# Patient Record
Sex: Male | Born: 1940 | Race: White | Hispanic: No | Marital: Single | Smoking: Never smoker
Health system: Southern US, Community
[De-identification: ages and names within clinical notes are randomized; demographics above are authoritative.]

## PROBLEM LIST (undated history)

## (undated) DIAGNOSIS — M1990 Unspecified osteoarthritis, unspecified site: Secondary | ICD-10-CM

## (undated) DIAGNOSIS — J45909 Unspecified asthma, uncomplicated: Secondary | ICD-10-CM

## (undated) DIAGNOSIS — M5136 Other intervertebral disc degeneration, lumbar region: Secondary | ICD-10-CM

## (undated) DIAGNOSIS — G2581 Restless legs syndrome: Secondary | ICD-10-CM

## (undated) DIAGNOSIS — K439 Ventral hernia without obstruction or gangrene: Secondary | ICD-10-CM

## (undated) HISTORY — PX: SHOULDER SURGERY: SHX246

## (undated) HISTORY — PX: HERNIA REPAIR: SHX51

---

## 1998-10-29 ENCOUNTER — Inpatient Hospital Stay (HOSPITAL_COMMUNITY): Admission: EM | Admit: 1998-10-29 | Discharge: 1998-11-07 | Payer: Self-pay | Admitting: Emergency Medicine

## 1998-10-29 ENCOUNTER — Encounter: Payer: Self-pay | Admitting: Emergency Medicine

## 1998-11-03 ENCOUNTER — Encounter: Payer: Self-pay | Admitting: Pulmonary Disease

## 1998-11-06 ENCOUNTER — Encounter: Payer: Self-pay | Admitting: Pulmonary Disease

## 2005-12-09 ENCOUNTER — Emergency Department (HOSPITAL_COMMUNITY): Admission: EM | Admit: 2005-12-09 | Discharge: 2005-12-09 | Payer: Self-pay | Admitting: Emergency Medicine

## 2005-12-19 ENCOUNTER — Encounter: Admission: RE | Admit: 2005-12-19 | Discharge: 2005-12-19 | Payer: Self-pay | Admitting: Orthopedic Surgery

## 2006-01-10 ENCOUNTER — Inpatient Hospital Stay (HOSPITAL_COMMUNITY): Admission: RE | Admit: 2006-01-10 | Discharge: 2006-01-11 | Payer: Self-pay | Admitting: Orthopedic Surgery

## 2006-02-08 ENCOUNTER — Encounter: Admission: RE | Admit: 2006-02-08 | Discharge: 2006-02-08 | Payer: Self-pay | Admitting: Orthopedic Surgery

## 2006-03-13 ENCOUNTER — Encounter: Admission: RE | Admit: 2006-03-13 | Discharge: 2006-03-21 | Payer: Self-pay | Admitting: Orthopedic Surgery

## 2011-02-01 ENCOUNTER — Emergency Department (HOSPITAL_COMMUNITY)
Admission: EM | Admit: 2011-02-01 | Discharge: 2011-02-01 | Disposition: A | Discharge status: Home / Self Care | Payer: Medicare Other | Attending: Emergency Medicine | Admitting: Emergency Medicine

## 2011-02-01 DIAGNOSIS — K219 Gastro-esophageal reflux disease without esophagitis: Secondary | ICD-10-CM | POA: Insufficient documentation

## 2011-02-01 DIAGNOSIS — J029 Acute pharyngitis, unspecified: Secondary | ICD-10-CM | POA: Insufficient documentation

## 2011-02-01 DIAGNOSIS — J45909 Unspecified asthma, uncomplicated: Secondary | ICD-10-CM | POA: Insufficient documentation

## 2012-01-26 ENCOUNTER — Encounter (HOSPITAL_COMMUNITY): Payer: Self-pay | Admitting: Emergency Medicine

## 2012-01-26 ENCOUNTER — Emergency Department (HOSPITAL_COMMUNITY)
Admission: EM | Admit: 2012-01-26 | Discharge: 2012-01-26 | Disposition: A | Discharge status: Home / Self Care | Payer: Medicare Other | Attending: Emergency Medicine | Admitting: Emergency Medicine

## 2012-01-26 DIAGNOSIS — Z79899 Other long term (current) drug therapy: Secondary | ICD-10-CM | POA: Insufficient documentation

## 2012-01-26 DIAGNOSIS — R339 Retention of urine, unspecified: Secondary | ICD-10-CM | POA: Insufficient documentation

## 2012-01-26 DIAGNOSIS — N289 Disorder of kidney and ureter, unspecified: Secondary | ICD-10-CM | POA: Insufficient documentation

## 2012-01-26 HISTORY — DX: Unspecified asthma, uncomplicated: J45.909

## 2012-01-26 HISTORY — DX: Restless legs syndrome: G25.81

## 2012-01-26 LAB — URINALYSIS, ROUTINE W REFLEX MICROSCOPIC
Bilirubin Urine: NEGATIVE
Ketones, ur: NEGATIVE mg/dL
Leukocytes, UA: NEGATIVE
Protein, ur: NEGATIVE mg/dL
Specific Gravity, Urine: 1.012 (ref 1.005–1.030)
Urobilinogen, UA: 0.2 mg/dL (ref 0.0–1.0)
pH: 5.5 (ref 5.0–8.0)

## 2012-01-26 LAB — BASIC METABOLIC PANEL
BUN: 14 mg/dL (ref 6–23)
CO2: 26 mEq/L (ref 19–32)
Calcium: 9.5 mg/dL (ref 8.4–10.5)
GFR calc non Af Amer: 52 mL/min — ABNORMAL LOW (ref >90)
Glucose, Bld: 122 mg/dL — ABNORMAL HIGH (ref 70–99)
Potassium: 4.1 mEq/L (ref 3.5–5.1)

## 2012-01-26 LAB — CBC WITH DIFFERENTIAL/PLATELET
Eosinophils Relative: 5 % (ref 0–5)
Hemoglobin: 13.5 g/dL (ref 13.0–17.0)
MCV: 93 fL (ref 78.0–100.0)
Neutro Abs: 7.4 10*3/uL (ref 1.7–7.7)
Platelets: 282 10*3/uL (ref 150–400)
RBC: 4.15 MIL/uL — ABNORMAL LOW (ref 4.22–5.81)
WBC: 11.7 10*3/uL — ABNORMAL HIGH (ref 4.0–10.5)

## 2012-01-26 MED ORDER — LIDOCAINE HCL 2 % EX GEL
Freq: Once | CUTANEOUS | Status: AC
  Administered 2012-01-26: 10 via URETHRAL

## 2012-01-26 MED ORDER — LIDOCAINE HCL 2 % EX GEL
CUTANEOUS | Status: AC
  Administered 2012-01-26: 10 via URETHRAL
  Filled 2012-01-26: qty 10

## 2012-01-26 NOTE — ED Notes (Signed)
Bladder scan performed. Bladder scan reveals volume > 999 ml.

## 2012-01-26 NOTE — ED Notes (Signed)
Foley drainage bag converted to leg bag. Patient and significant other instructed on use; both verbalized understanding.

## 2012-01-26 NOTE — ED Notes (Signed)
Foley catheter inserted without difficulty. Immediate return of 250 ml amber urine. 30 ml urine collected and sent to lab for urinalysis.Patient tolerated procedure well.

## 2012-01-26 NOTE — ED Provider Notes (Signed)
History     CSN: [id number]  Arrival date & time 01/26/12  2000   First MD Initiated Contact with Patient 01/26/12 2032      Chief Complaint  Patient presents with  . Dysuria     HPI Patient presents to the emergency room with complaints of decreased urination.  For the last few days he did notice some burning with urination. Today however he has not been able to urinate properly. The last time he was able to urinate normally with him in the morning. He has not had any fevers or vomiting. His history of having urinary retention in the past. Patient denies any chest pain or shortness of breath. The symptoms are moderate to severe and nothing is making it better Past Medical History  Diagnosis Date  . Asthma   . RLS (restless legs syndrome)     Past Surgical History  Procedure Date  . Hernia repair     No family history on file.  History  Substance Use Topics  . Smoking status: Never Smoker   . Smokeless tobacco: Not on file  . Alcohol Use: Yes     4x week      Review of Systems  All other systems reviewed and are negative.    Allergies  Morphine and related  Home Medications   Current Outpatient Rx  Name Route Sig Dispense Refill  . CLONAZEPAM 0.5 MG PO TABS Oral Take 0.5 mg by mouth 2 (two) times daily as needed.    Marland Kitchen NAPROXEN 500 MG PO TABS Oral Take 500 mg by mouth 2 (two) times daily with a meal.    . OMEPRAZOLE 20 MG PO CPDR Oral Take 20 mg by mouth daily.      BP 137/96  Pulse 102  Temp 99 F (37.2 C) (Oral)  Resp 18  Ht 5\' 6"  (1.676 m)  Wt 182 lb (82.555 kg)  BMI 29.38 kg/m2  SpO2 98%  Physical Exam  Nursing note and vitals reviewed. Constitutional: He appears well-developed and well-nourished. No distress.  HENT:  Head: Normocephalic and atraumatic.  Right Ear: External ear normal.  Left Ear: External ear normal.  Eyes: Conjunctivae are normal. Right eye exhibits no discharge. Left eye exhibits no discharge. No scleral icterus.  Neck:  Neck supple. No tracheal deviation present.  Cardiovascular: Normal rate, regular rhythm and intact distal pulses.   Pulmonary/Chest: Effort normal and breath sounds normal. No stridor. No respiratory distress. He has no wheezes. He has no rales.  Abdominal: Soft. Bowel sounds are normal. He exhibits no distension. There is tenderness (suprapubic region). There is no rebound and no guarding.       Surgical scars on abdomen, ventral hernia   Genitourinary: Penis normal.  Musculoskeletal: He exhibits no edema and no tenderness.  Neurological: He is alert. He has normal strength. No sensory deficit. Cranial nerve deficit:  no gross defecits noted. He exhibits normal muscle tone. He displays no seizure activity. Coordination normal.       Decreased hearing  Skin: Skin is warm and dry. No rash noted.  Psychiatric: He has a normal mood and affect.    ED Course  Procedures (including critical care time)  Labs Reviewed  URINALYSIS, ROUTINE W REFLEX MICROSCOPIC - Abnormal; Notable for the following:    Hgb urine dipstick MODERATE (*)     All other components within normal limits  CBC WITH DIFFERENTIAL - Abnormal; Notable for the following:    WBC 11.7 (*)  RBC 4.15 (*)     HCT 38.6 (*)     Monocytes Relative 14 (*)     Monocytes Absolute 1.7 (*)     All other components within normal limits  BASIC METABOLIC PANEL - Abnormal; Notable for the following:    Glucose, Bld 122 (*)     Creatinine, Ser 1.36 (*)     GFR calc non Af Amer 52 (*)     GFR calc Af Amer 60 (*)     All other components within normal limits  URINE MICROSCOPIC-ADD ON   No results found.   1. Urinary retention       MDM  Bladder scan revealed greater than 1000 cc of urine. A Foley catheter was inserted by nursing staff the patient is feeling much better at this time.  Urinalysis does not suggest a urinary infection.  He does have some mild renal insufficiency but this can be followed up as an outpatient. Patient  discharged home with a Foley catheter in I will refer him to urology.  At this time there does not appear to be any evidence of an acute emergency medical condition and the patient appears stable for discharge with appropriate outpatient follow up.         Celene Kras, MD 01/26/12 2212

## 2012-01-26 NOTE — ED Notes (Addendum)
Pt c/o painful, increased urination x 3 days. Also c/o severe intermittent lower abd pain, pain exacerbated with standing and coughing. No distress noted, PWD

## 2013-05-19 ENCOUNTER — Encounter (HOSPITAL_COMMUNITY): Payer: Self-pay | Admitting: Emergency Medicine

## 2013-05-19 ENCOUNTER — Emergency Department (HOSPITAL_COMMUNITY)
Admission: EM | Admit: 2013-05-19 | Discharge: 2013-05-19 | Disposition: A | Discharge status: Home / Self Care | Payer: Medicare Other | Attending: Emergency Medicine | Admitting: Emergency Medicine

## 2013-05-19 DIAGNOSIS — Z79899 Other long term (current) drug therapy: Secondary | ICD-10-CM | POA: Insufficient documentation

## 2013-05-19 DIAGNOSIS — R109 Unspecified abdominal pain: Secondary | ICD-10-CM | POA: Insufficient documentation

## 2013-05-19 DIAGNOSIS — J45909 Unspecified asthma, uncomplicated: Secondary | ICD-10-CM | POA: Insufficient documentation

## 2013-05-19 DIAGNOSIS — IMO0002 Reserved for concepts with insufficient information to code with codable children: Secondary | ICD-10-CM | POA: Insufficient documentation

## 2013-05-19 DIAGNOSIS — R339 Retention of urine, unspecified: Secondary | ICD-10-CM | POA: Insufficient documentation

## 2013-05-19 DIAGNOSIS — G2581 Restless legs syndrome: Secondary | ICD-10-CM | POA: Insufficient documentation

## 2013-05-19 LAB — URINALYSIS, ROUTINE W REFLEX MICROSCOPIC
Glucose, UA: NEGATIVE mg/dL
Ketones, ur: 15 mg/dL — AB
Leukocytes, UA: NEGATIVE
Protein, ur: NEGATIVE mg/dL
Urobilinogen, UA: 0.2 mg/dL (ref 0.0–1.0)

## 2013-05-19 NOTE — ED Notes (Addendum)
Pt states that he has not been able to urinate for 2.5 days. Pt states when he goes he "squirks a little" but does not empty his bladder. 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 Family reports that he states that he does not feel good and in pain. Pt states pain is located in his bladder, back side and head. Pt report last bm Friday. Pt has ab hernia. Pt goes to [place].

## 2013-05-19 NOTE — ED Provider Notes (Signed)
CSN: [id number]     Arrival date & time 05/19/13  0149 History   First MD Initiated Contact with Patient 05/19/13 [phone]     Chief Complaint  Patient presents with  . Urinary Retention  . Fatigue   (Consider location/radiation/quality/duration/timing/severity/associated sxs/prior Treatment) HPI History provided by patient. Difficulty urinating for the last 2 days. Able to go very small amount but feels like he is not emptying his bladder. He has increasing lower ABD discomfort, moderate to severe. He denies any back pain, fevers, nausea or vomiting. Past Medical History  Diagnosis Date  . Asthma   . RLS (restless legs syndrome)    Past Surgical History  Procedure Laterality Date  . Hernia repair     History reviewed. No pertinent family history. History  Substance Use Topics  . Smoking status: Never Smoker   . Smokeless tobacco: Not on file  . Alcohol Use: Yes     Comment: 4x week    Review of Systems  Constitutional: Negative for fever and chills.  Respiratory: Negative for shortness of breath.   Cardiovascular: Negative for chest pain.  Gastrointestinal: Positive for abdominal pain. Negative for vomiting.  Genitourinary: Negative for dysuria.  Musculoskeletal: Negative for back pain.  Skin: Negative for rash.  Neurological: Negative for headaches.  All other systems reviewed and are negative.    Allergies  Cheese and Morphine and related  Home Medications   Current Outpatient Rx  Name  Route  Sig  Dispense  Refill  . atorvastatin (LIPITOR) 40 MG tablet   Oral   Take 40 mg by mouth daily.         . benzonatate (TESSALON) 100 MG capsule   Oral   Take 100 mg by mouth 3 (three) times daily as needed for cough.         . finasteride (PROSCAR) 5 MG tablet   Oral   Take 5 mg by mouth daily.         Marland Kitchen FLUoxetine (PROZAC) 20 MG capsule   Oral   Take 20 mg by mouth daily.         . fluticasone (FLONASE) 50 MCG/ACT nasal spray   Each Nare   Place 1  spray into both nostrils daily.         Marland Kitchen gabapentin (NEURONTIN) 100 MG capsule   Oral   Take 100 mg by mouth 3 (three) times daily.         . naproxen (NAPROSYN) 500 MG tablet   Oral   Take 500 mg by mouth 2 (two) times daily with a meal.         . omeprazole (PRILOSEC) 20 MG capsule   Oral   Take 20 mg by mouth daily.         Marland Kitchen terazosin (HYTRIN) 5 MG capsule   Oral   Take 5 mg by mouth at bedtime.         . Vitamin D, Ergocalciferol, (DRISDOL) 50000 UNITS CAPS capsule   Oral   Take 50,000 Units by mouth every 7 (seven) days.          BP 167/83  Pulse 95  Temp(Src) 98 F (36.7 C) (Oral)  Resp 18  SpO2 97% Physical Exam  Constitutional: He is oriented to person, place, and time. He appears well-developed and well-nourished.  HENT:  Head: Normocephalic and atraumatic.  Eyes: EOM are normal. Pupils are equal, round, and reactive to light.  Neck: Neck supple.  Cardiovascular: Normal rate, regular rhythm  and intact distal pulses.   Pulmonary/Chest: Effort normal. No respiratory distress.  Abdominal: Soft. Bowel sounds are normal.  Suprapubic fullness and tenderness without abdominal tenderness otherwise  Genitourinary:  Normal external GU  Musculoskeletal: Normal range of motion. He exhibits no edema.  Neurological: He is alert and oriented to person, place, and time.  Skin: Skin is warm and dry.    ED Course  Procedures (including critical care time) Labs Review Labs Reviewed  URINALYSIS, ROUTINE W REFLEX MICROSCOPIC - Abnormal; Notable for the following:    APPearance HAZY (*)    Ketones, ur 15 (*)    All other components within normal limits   Foley catheter placed and 800 cc clear urine output  Patient given referral to urology with leg bag. He states understanding all discharge and followup instructions. Return precautions provided. Stable for discharge home at this time.  MDM  Diagnosis: Urinary retention  UA reviewed as above no  UTI. Repeat abdominal exam benign. Soft nontender nondistended. Condition improved after Foley catheter placed. VS WNL    Sunnie Nielsen, MD 05/21/13 [phone]

## 2013-05-19 NOTE — ED Notes (Signed)
Pt c/o urinary retention x 3 days. Last bowel movement was the 28th. A&Ox4. NAd noted. Pt. Very hard of hearing.

## 2016-01-13 ENCOUNTER — Other Ambulatory Visit: Payer: Self-pay

## 2016-01-13 DIAGNOSIS — K625 Hemorrhage of anus and rectum: Secondary | ICD-10-CM

## 2016-02-24 ENCOUNTER — Ambulatory Visit
Admission: RE | Admit: 2016-02-24 | Discharge: 2016-02-24 | Disposition: A | Discharge status: Home / Self Care | Payer: Medicare Other | Source: Ambulatory Visit

## 2016-02-24 DIAGNOSIS — K625 Hemorrhage of anus and rectum: Secondary | ICD-10-CM

## 2017-12-03 ENCOUNTER — Other Ambulatory Visit: Payer: Self-pay

## 2017-12-03 ENCOUNTER — Encounter (HOSPITAL_COMMUNITY): Payer: Self-pay

## 2017-12-03 ENCOUNTER — Inpatient Hospital Stay (HOSPITAL_COMMUNITY)
Admission: EM | Admit: 2017-12-03 | Discharge: 2017-12-09 | DRG: 853 | Disposition: A | Discharge status: Home Health | Payer: Medicare Other | Attending: Internal Medicine | Admitting: Internal Medicine

## 2017-12-03 ENCOUNTER — Emergency Department (HOSPITAL_COMMUNITY): Payer: Medicare Other

## 2017-12-03 DIAGNOSIS — F329 Major depressive disorder, single episode, unspecified: Secondary | ICD-10-CM | POA: Diagnosis present

## 2017-12-03 DIAGNOSIS — B9620 Unspecified Escherichia coli [E. coli] as the cause of diseases classified elsewhere: Secondary | ICD-10-CM | POA: Diagnosis present

## 2017-12-03 DIAGNOSIS — I96 Gangrene, not elsewhere classified: Secondary | ICD-10-CM | POA: Diagnosis present

## 2017-12-03 DIAGNOSIS — K632 Fistula of intestine: Secondary | ICD-10-CM | POA: Diagnosis present

## 2017-12-03 DIAGNOSIS — A419 Sepsis, unspecified organism: Principal | ICD-10-CM | POA: Diagnosis present

## 2017-12-03 DIAGNOSIS — G2581 Restless legs syndrome: Secondary | ICD-10-CM | POA: Diagnosis present

## 2017-12-03 DIAGNOSIS — K449 Diaphragmatic hernia without obstruction or gangrene: Secondary | ICD-10-CM | POA: Diagnosis present

## 2017-12-03 DIAGNOSIS — D649 Anemia, unspecified: Secondary | ICD-10-CM | POA: Diagnosis present

## 2017-12-03 DIAGNOSIS — H9190 Unspecified hearing loss, unspecified ear: Secondary | ICD-10-CM | POA: Diagnosis present

## 2017-12-03 DIAGNOSIS — K3532 Acute appendicitis with perforation and localized peritonitis, without abscess: Secondary | ICD-10-CM | POA: Diagnosis present

## 2017-12-03 DIAGNOSIS — E669 Obesity, unspecified: Secondary | ICD-10-CM | POA: Diagnosis present

## 2017-12-03 DIAGNOSIS — F419 Anxiety disorder, unspecified: Secondary | ICD-10-CM | POA: Diagnosis present

## 2017-12-03 DIAGNOSIS — N400 Enlarged prostate without lower urinary tract symptoms: Secondary | ICD-10-CM | POA: Diagnosis present

## 2017-12-03 DIAGNOSIS — N183 Chronic kidney disease, stage 3 (moderate): Secondary | ICD-10-CM | POA: Diagnosis present

## 2017-12-03 DIAGNOSIS — E785 Hyperlipidemia, unspecified: Secondary | ICD-10-CM | POA: Diagnosis present

## 2017-12-03 DIAGNOSIS — J45909 Unspecified asthma, uncomplicated: Secondary | ICD-10-CM | POA: Diagnosis present

## 2017-12-03 DIAGNOSIS — K3533 Acute appendicitis with perforation and localized peritonitis, with abscess: Secondary | ICD-10-CM | POA: Diagnosis present

## 2017-12-03 DIAGNOSIS — L02211 Cutaneous abscess of abdominal wall: Secondary | ICD-10-CM | POA: Diagnosis present

## 2017-12-03 DIAGNOSIS — K651 Peritoneal abscess: Secondary | ICD-10-CM | POA: Diagnosis not present

## 2017-12-03 DIAGNOSIS — K439 Ventral hernia without obstruction or gangrene: Secondary | ICD-10-CM | POA: Diagnosis present

## 2017-12-03 DIAGNOSIS — K4020 Bilateral inguinal hernia, without obstruction or gangrene, not specified as recurrent: Secondary | ICD-10-CM | POA: Diagnosis present

## 2017-12-03 DIAGNOSIS — K5900 Constipation, unspecified: Secondary | ICD-10-CM | POA: Diagnosis present

## 2017-12-03 HISTORY — DX: Unspecified osteoarthritis, unspecified site: M19.90

## 2017-12-03 HISTORY — DX: Other intervertebral disc degeneration, lumbar region: M51.36

## 2017-12-03 HISTORY — DX: Ventral hernia without obstruction or gangrene: K43.9

## 2017-12-03 LAB — CBC WITH DIFFERENTIAL/PLATELET
BASOS ABS: 0 10*3/uL (ref 0.0–0.1)
BASOS PCT: 0 %
Eosinophils Absolute: 0.4 10*3/uL (ref 0.0–0.7)
Eosinophils Relative: 3 %
HEMATOCRIT: 31.2 % — AB (ref 39.0–52.0)
Hemoglobin: 10.1 g/dL — ABNORMAL LOW (ref 13.0–17.0)
LYMPHS PCT: 11 %
Lymphs Abs: 1.6 10*3/uL (ref 0.7–4.0)
MCH: 31.1 pg (ref 26.0–34.0)
MCHC: 32.4 g/dL (ref 30.0–36.0)
MCV: 96 fL (ref 78.0–100.0)
MONOS PCT: 9 %
Monocytes Absolute: 1.3 10*3/uL — ABNORMAL HIGH (ref 0.1–1.0)
NEUTROS ABS: 10.8 10*3/uL — AB (ref 1.7–7.7)
NEUTROS PCT: 77 %
Platelets: 316 10*3/uL (ref 150–400)
RBC: 3.25 MIL/uL — ABNORMAL LOW (ref 4.22–5.81)
RDW: 13.2 % (ref 11.5–15.5)
WBC: 14.1 10*3/uL — ABNORMAL HIGH (ref 4.0–10.5)

## 2017-12-03 LAB — COMPREHENSIVE METABOLIC PANEL
ALBUMIN: 3 g/dL — AB (ref 3.5–5.0)
ALK PHOS: 71 U/L (ref 38–126)
ALT: 34 U/L (ref 0–44)
AST: 21 U/L (ref 15–41)
Anion gap: 10 (ref 5–15)
BILIRUBIN TOTAL: 0.1 mg/dL — AB (ref 0.3–1.2)
BUN: 27 mg/dL — AB (ref 8–23)
CO2: 26 mmol/L (ref 22–32)
Calcium: 9.1 mg/dL (ref 8.9–10.3)
Chloride: 102 mmol/L (ref 98–111)
Creatinine, Ser: 1.56 mg/dL — ABNORMAL HIGH (ref 0.61–1.24)
GFR calc Af Amer: 48 mL/min — ABNORMAL LOW (ref >60)
GFR, EST NON AFRICAN AMERICAN: 42 mL/min — AB (ref >60)
GLUCOSE: 144 mg/dL — AB (ref 70–99)
Potassium: 4 mmol/L (ref 3.5–5.1)
Sodium: 138 mmol/L (ref 135–145)
TOTAL PROTEIN: 7 g/dL (ref 6.5–8.1)

## 2017-12-03 LAB — C-REACTIVE PROTEIN: CRP: 13.4 mg/dL — ABNORMAL HIGH (ref <1.0)

## 2017-12-03 LAB — SEDIMENTATION RATE: Sed Rate: 126 mm/hr — ABNORMAL HIGH (ref 0–16)

## 2017-12-03 LAB — I-STAT CG4 LACTIC ACID, ED: Lactic Acid, Venous: 1.42 mmol/L (ref 0.5–1.9)

## 2017-12-03 MED ORDER — PIPERACILLIN-TAZOBACTAM 3.375 G IVPB
3.3750 g | Freq: Three times a day (TID) | INTRAVENOUS | Status: DC
  Administered 2017-12-04 – 2017-12-07 (×10): 3.375 g via INTRAVENOUS
  Filled 2017-12-03 (×12): qty 50

## 2017-12-03 MED ORDER — PIPERACILLIN-TAZOBACTAM 3.375 G IVPB 30 MIN
3.3750 g | Freq: Once | INTRAVENOUS | Status: AC
  Administered 2017-12-03: 3.375 g via INTRAVENOUS
  Filled 2017-12-03: qty 50

## 2017-12-03 MED ORDER — SODIUM CHLORIDE 0.9 % IV BOLUS
1000.0000 mL | Freq: Once | INTRAVENOUS | Status: AC
  Administered 2017-12-03: 1000 mL via INTRAVENOUS

## 2017-12-03 MED ORDER — IOHEXOL 300 MG/ML  SOLN
75.0000 mL | Freq: Once | INTRAMUSCULAR | Status: AC | PRN
  Administered 2017-12-03: 75 mL via INTRAVENOUS

## 2017-12-03 MED ORDER — VANCOMYCIN HCL 10 G IV SOLR
2000.0000 mg | Freq: Once | INTRAVENOUS | Status: AC
  Administered 2017-12-03: 2000 mg via INTRAVENOUS
  Filled 2017-12-03: qty 2000

## 2017-12-03 MED ORDER — ACETAMINOPHEN 325 MG PO TABS
650.0000 mg | ORAL_TABLET | Freq: Once | ORAL | Status: AC
  Administered 2017-12-03: 650 mg via ORAL
  Filled 2017-12-03: qty 2

## 2017-12-03 NOTE — Progress Notes (Signed)
Pharmacy Antibiotic Note  Jack Vance is a 77 y.o. 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  Plan: Zosyn 3.375 gm IV q8h extended interval Pharmacy will sign off and follow peripherally for renal adjustments  Height: 5\' 6"  (167.6 cm) Weight: 195 lb (88.5 kg) IBW/kg (Calculated) : 63.8  Temp (24hrs), Avg:97.6 F (36.4 C), Min:97.3 F (36.3 C), Max:97.8 F (36.6 C)  Recent Labs  Lab 12/03/17 1914 12/03/17 1920  WBC 14.1*  --   CREATININE 1.56*  --   LATICACIDVEN  --  1.42    Estimated Creatinine Clearance: 42.7 mL/min (A) (by C-G formula based on SCr of 1.56 mg/dL (H)).    Allergies  Allergen Reactions  . Cheese     Causes asthma  . Morphine And Related Rash    Antimicrobials this admission: 7/16 vanc  X 1 7/ 16 zosyn >>  Thank you for allowing pharmacy to be a part of this patient's care.  Arley Phenix RPh 12/03/2017, 10:50 PM Pager [phone]

## 2017-12-03 NOTE — ED Provider Notes (Signed)
[facility]-EMERGENCY DEPT Provider Note   CSN: [id number] Arrival date & time: 12/03/17  1044     History   Chief Complaint Chief Complaint  Patient presents with  . Abdominal Pain  . redness    HPI Jack Vance is a 77 y.o. male.  HPI 77 year old male with past medical history of extremely large hernia here with drainage and redness to the right abdomen.  Patient states that over the last several days, he noticed a small area of swelling in his right lower abdomen.  It then became red and began draining foul-smelling fluid.  He had associated nausea but no fever.  Is been moving his bowels.  He states that the redness has become increasingly painful and is aching, throbbing, worse with palpation.  Spreading throughout his abdomen so he presents for evaluation.  He does not know or think he has had any fevers.  Denies any diarrhea.  No other complaints.  Past Medical History:  Diagnosis Date  . Arthritis   . Asthma   . DDD (degenerative disc disease), lumbar   . Hernia of abdominal wall   . RLS (restless legs syndrome)     Patient Active Problem List   Diagnosis Date Noted  . 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 HERNIA REPAIR    . SHOULDER SURGERY          Home Medications    Prior to Admission medications   Medication Sig Start Date End Date Taking? Authorizing Provider  atorvastatin (LIPITOR) 80 MG tablet Take 80 mg by mouth daily.   Yes [provider]  budesonide-formoterol (SYMBICORT) 160-4.5 MCG/ACT inhaler Inhale 2 puffs into the lungs 2 (two) times daily.   Yes [provider]  clonazePAM (KLONOPIN) 1 MG tablet Take 1 mg by mouth 2 (two) times daily.   Yes [provider]  finasteride (PROSCAR) 5 MG tablet Take 5 mg by mouth daily.   Yes [provider]  FLUoxetine (PROZAC) 20 MG capsule Take 20 mg by mouth daily.   Yes [provider]  fluticasone (FLONASE) 50 MCG/ACT nasal spray Place 1 spray into both nostrils daily.   Yes [provider]  gabapentin (NEURONTIN) 400 MG capsule Take 400-800 mg by mouth 3 (three) times daily. Take 1 capsule BID and Take 2 capsules QHS   Yes [provider]  Ipratropium-Albuterol (COMBIVENT RESPIMAT) 20-100 MCG/ACT AERS respimat Inhale 1 puff into the lungs every 6 (six) hours.   Yes [provider]  lidocaine (LIDODERM) 5 % Place 1 patch onto the skin daily. Remove & Discard patch within 12 hours or as directed by MD   Yes [provider]  rOPINIRole (REQUIP) 2 MG tablet Take 4 mg by mouth 3 (three) times daily.   Yes [provider]  tamsulosin (FLOMAX) 0.4 MG CAPS capsule Take 0.4 mg by mouth daily.   Yes [provider]  triamterene-hydrochlorothiazide (MAXZIDE-25) 37.5-25 MG tablet Take 1 tablet by mouth daily.   Yes [provider]  benzonatate (TESSALON) 100 MG capsule Take 100 mg by mouth 3 (three) times daily as needed for cough.    [provider]    Family History History reviewed. No pertinent family history.  Social History Social History   Tobacco Use  . Smoking status: Never Smoker  . Smokeless tobacco: Never Used  Substance Use Topics  . Alcohol use: Yes    Comment:  4x week  . Drug use: No     Allergies   Cheese and Morphine and related   Review of Systems Review of Systems  Constitutional: Positive for fatigue. Negative for chills and fever.  HENT: Negative for congestion and rhinorrhea.   Eyes: Negative for visual disturbance.  Respiratory: Negative for cough, shortness of breath and wheezing.   Cardiovascular: Negative for chest pain and leg swelling.  Gastrointestinal: Positive for abdominal pain and nausea. Negative for diarrhea and vomiting.  Genitourinary: Negative for dysuria and flank pain.  Musculoskeletal: Negative for neck pain and neck stiffness.  Skin:  Positive for rash and wound.  Allergic/Immunologic: Negative for immunocompromised state.  Neurological: Negative for syncope, weakness and headaches.  All other systems reviewed and are negative.    Physical Exam Updated Vital Signs BP 128/66 (BP Location: Left Arm)   Pulse 64   Temp 97.7 F (36.5 C) (Oral)   Resp 16   Ht 5\' 6"  (1.676 m)   Wt 88.5 kg (195 lb)   SpO2 98%   BMI 31.47 kg/m   Physical Exam  Constitutional: He is oriented to person, place, and time. He appears well-developed and well-nourished. No distress.  HENT:  Head: Normocephalic and atraumatic.  Eyes: Conjunctivae are normal.  Neck: Neck supple.  Cardiovascular: Normal rate, regular rhythm and normal heart sounds. Exam reveals no friction rub.  No murmur heard. Pulmonary/Chest: Effort normal and breath sounds normal. No respiratory distress. He has no wheezes. He has no rales.  Abdominal: He exhibits no distension.  Massive hernia with multiple palpable areas of dilated bowel.  Large, draining, foul-smelling wound to the right lateral abdomen overlying the hernia, with surrounding skin induration and redness.  Musculoskeletal: He exhibits no edema.  Neurological: He is alert and oriented to person, place, and time. He exhibits normal muscle tone.  Skin: Skin is warm. Capillary refill takes less than 2 seconds.  Psychiatric: He has a normal mood and affect.  Nursing note and vitals reviewed.       ED Treatments / Results  Labs (all labs ordered are listed, but only abnormal results are displayed) Labs Reviewed  CBC WITH DIFFERENTIAL/PLATELET - Abnormal; Notable for the following components:      Result Value   WBC 14.1 (*)    RBC 3.25 (*)    Hemoglobin 10.1 (*)    HCT 31.2 (*)    Neutro Abs 10.8 (*)    Monocytes Absolute 1.3 (*)    All other components within normal limits  COMPREHENSIVE METABOLIC PANEL - Abnormal; Notable for the following components:   Glucose, Bld 144 (*)    BUN 27 (*)     Creatinine, Ser 1.56 (*)    Albumin 3.0 (*)    Total Bilirubin 0.1 (*)    GFR calc non Af Amer 42 (*)    GFR calc Af Amer 48 (*)    All other components within normal limits  SEDIMENTATION RATE - Abnormal; Notable for the following components:   Sed Rate 126 (*)    All other components within normal limits  C-REACTIVE PROTEIN  I-STAT CG4 LACTIC ACID, ED  I-STAT CG4 LACTIC ACID, ED    EKG None  Radiology Ct Abdomen Pelvis W Contrast  Result Date: 12/03/2017 CLINICAL DATA:  Expanding focal erythematous lesion on the right abdomen with drainage, erythema, and increasing pain. Nausea and vomiting. EXAM: CT ABDOMEN AND PELVIS WITH CONTRAST TECHNIQUE: Multidetector CT imaging of the abdomen and pelvis was performed using the  standard protocol following bolus administration of intravenous contrast. CONTRAST:  75mL OMNIPAQUE IOHEXOL 300 MG/ML  SOLN COMPARISON:  02/24/2016 FINDINGS: Lower chest: Mild patchy interstitial accentuation in the lung bases. 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 Hepatobiliary: Mildly contracted gallbladder. Pancreas: Unremarkable aside from the pancreatic body extending up into the chest as part of a hiatal hernia. Spleen: Unremarkable Adrenals/Urinary Tract: Unremarkable Stomach/Bowel: Large ventral hernia with rectus diastasis. Ventral hernia contains stomach body and antrum, cecum, ascending colon, and transverse colon; and multiple loops of small bowel including the terminal ileum. Along the right side of the hernia, there is an abnormal localized fluid collection measuring approximately 6.1 by 6.9 by 6.4 cm, with gas density along its upper margin tracking out towards the skin. The appearance is suspicious for extraluminal fluid and possibly abscess. The possibility of acute appendicitis with periappendiceal abscess and drainage to the cutaneous surface is a distinct possibility. Postoperative findings in the proximal stomach. Vascular/Lymphatic:  Aortoiliac atherosclerotic vascular disease. Reproductive: Unremarkable Other: No supplemental non-categorized findings. Musculoskeletal: 7 mm degenerative anterolisthesis at L4-5 with multilevel spondylosis and degenerative disc disease. Prominent central narrowing of the thecal sac at L4-5. Bilateral direct inguinal hernias contain adipose tissue. IMPRESSION: 1. Abnormal inflammatory process in the right side of the large ventral hernia sac with abnormal fluid and small amounts of gas which are probably extraluminal compatible with abscess, and extending out to the cutaneous surface along the lateral margin of the hernia. Contained localized perforated appendicitis is a distinct possibility, as this was the location previously occupied by the appendix on the 11/24/2015 CT scan. The large ventral hernia sac contains part of the stomach, the distal ileum, the cecum and ascending colon, and the transverse colon. 2. 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 3. Bilateral direct inguinal hernias contain adipose tissue. 4. Other imaging findings of potential clinical significance: Mild interstitial accentuation in the lung bases, chronic. Postoperative stone findings in the proximal stomach. Aortic Atherosclerosis (ICD10-I70.0). Prominent central narrowing of the thecal sac at L4-5 due to degenerative anterolisthesis and spurring. These results were called by telephone at the time of interpretation on 12/03/2017 at 8:53 pm to Dr. Shaune Pollack , who verbally acknowledged these results. Electronically Signed   By: Gaylyn Rong M.D.   On: 12/03/2017 20:53    Procedures .Critical Care Performed by: Shaune Pollack, MD Authorized by: Shaune Pollack, MD   Critical care provider statement:    Critical care time (minutes):  35   Critical care time was exclusive of:  Separately billable procedures and treating other patients and teaching time   Critical care was necessary to treat or prevent  imminent or life-threatening deterioration of the following conditions:  Cardiac failure, circulatory failure and sepsis   Critical care was time spent personally by me on the following activities:  Development of treatment plan with patient or surrogate, discussions with consultants, evaluation of patient's response to treatment, examination of patient, obtaining history from patient or surrogate, ordering and performing treatments and interventions, ordering and review of laboratory studies, ordering and review of radiographic studies, pulse oximetry, re-evaluation of patient's condition and review of old charts   I assumed direction of critical care for this patient from another provider in my specialty: no     (including critical care time)  Medications Ordered in ED Medications  piperacillin-tazobactam (ZOSYN) IVPB 3.375 g (has no administration in time range)  sodium chloride 0.9 % bolus 1,000 mL (0 mLs Intravenous Stopped 12/03/17  2013)  piperacillin-tazobactam (ZOSYN) IVPB 3.375 g (0 g Intravenous Stopped 12/03/17 2018)  vancomycin (VANCOCIN) 2,000 mg in sodium chloride 0.9 % 500 mL IVPB (2,000 mg Intravenous New Bag/Given 12/03/17 1948)  iohexol (OMNIPAQUE) 300 MG/ML solution 75 mL (75 mLs Intravenous Contrast Given 12/03/17 2008)     Initial Impression / Assessment and Plan / ED Course  I have reviewed the triage vital signs and the nursing notes.  Pertinent labs & imaging results that were available during my care of the patient were reviewed by me and considered in my medical decision making (see chart for details).     2 male here with right sided abdominal pain, drainage, and redness.  Lab work shows significant leukocytosis and mild AKI.  Patient has been given fluids and antibiotics.  Imaging concerning for ruptured appendicitis within his hernia, which is now draining through the skin.  I discussed with Dr. Sheliah Hatch, who will see the patient.  IV antibiotics given for ruptured  appendicitis with c/f early sepsis. BP normal. Mentating well.  Final Clinical Impressions(s) / ED Diagnoses   Final diagnoses:  Ruptured appendicitis  Sepsis, due to unspecified organism [facility])    ED Discharge Orders    None       Shaune Pollack, MD 12/03/17 2253

## 2017-12-03 NOTE — Progress Notes (Signed)
A consult was received from an ED physician for Vancomycin per pharmacy dosing (for an indication other than meningitis). The patient's profile has been reviewed for ht/wt/allergies/indication/available labs. A one time order has been placed for the above antibiotics.  Further antibiotics/pharmacy consults should be ordered by admitting physician if indicated.                       Bernadene Person, PharmD, BCPS [phone] 12/03/2017, 6:56 PM

## 2017-12-03 NOTE — Consult Note (Signed)
Reason for Consult: fistula Referring Physician: Stacie Knutzen is an 77 y.o. male.  HPI: 77 yo male with 8 day history beginning with constipation resulting in redness in his right mid abdominal skin that began draining yellow fluid and now has started draining a foul smelling green fluid. He denies nausea or vomiting and has been having normal bowel movements. He has senstivity and burning pain around the area but no abdominal pain otherwise. He denies fevers or chills. He has had 2 hernia repairs the last being done about 20 years ago that was supposed to be a simple surgery but lasted 10 hours and required 3 intraoperative consults. He was unsure if intestine was removed during that surgery.  Past Medical History:  Diagnosis Date  . Arthritis   . Asthma   . DDD (degenerative disc disease), lumbar   . Hernia of abdominal wall   . RLS (restless legs syndrome)     Past Surgical History:  Procedure Laterality Date  . HERNIA REPAIR    . SHOULDER SURGERY      History reviewed. No pertinent family history.  Social History:  reports that he has never smoked. He has never used smokeless tobacco. He reports that he drinks alcohol. He reports that he does not use drugs.  Allergies:  Allergies  Allergen Reactions  . Cheese     Causes asthma  . Morphine And Related Rash    Medications: I have reviewed the patient's current medications.  Results for orders placed or performed during the hospital encounter of 12/03/17 (from the past 48 hour(s))  CBC with Differential     Status: Abnormal   Collection Time: 12/03/17  7:14 PM  Result Value Ref Range   WBC 14.1 (H) 4.0 - 10.5 K/uL   RBC 3.25 (L) 4.22 - 5.81 MIL/uL   Hemoglobin 10.1 (L) 13.0 - 17.0 g/dL   HCT 31.2 (L) 39.0 - 52.0 %   MCV 96.0 78.0 - 100.0 fL   MCH 31.1 26.0 - 34.0 pg   MCHC 32.4 30.0 - 36.0 g/dL   RDW 13.2 11.5 - 15.5 %   Platelets 316 150 - 400 K/uL   Neutrophils Relative % 77 %   Neutro Abs 10.8 (H) 1.7 -  7.7 K/uL   Lymphocytes Relative 11 %   Lymphs Abs 1.6 0.7 - 4.0 K/uL   Monocytes Relative 9 %   Monocytes Absolute 1.3 (H) 0.1 - 1.0 K/uL   Eosinophils Relative 3 %   Eosinophils Absolute 0.4 0.0 - 0.7 K/uL   Basophils Relative 0 %   Basophils Absolute 0.0 0.0 - 0.1 K/uL    Comment: Performed at [facility], [place] [address] [place], [place] [address]  Comprehensive metabolic panel     Status: Abnormal   Collection Time: 12/03/17  7:14 PM  Result Value Ref Range   Sodium 138 135 - 145 mmol/L   Potassium 4.0 3.5 - 5.1 mmol/L   Chloride 102 98 - 111 mmol/L    Comment: Please note change in reference range.   CO2 26 22 - 32 mmol/L   Glucose, Bld 144 (H) 70 - 99 mg/dL    Comment: Please note change in reference range.   BUN 27 (H) 8 - 23 mg/dL    Comment: Please note change in reference range.   Creatinine, Ser 1.56 (H) 0.61 - 1.24 mg/dL   Calcium 9.1 8.9 - 10.3 mg/dL   Total Protein 7.0 6.5 - 8.1 g/dL  Albumin 3.0 (L) 3.5 - 5.0 g/dL   AST 21 15 - 41 U/L   ALT 34 0 - 44 U/L    Comment: Please note change in reference range.   Alkaline Phosphatase 71 38 - 126 U/L   Total Bilirubin 0.1 (L) 0.3 - 1.2 mg/dL   GFR calc non Af Amer 42 (L) >60 mL/min   GFR calc Af Amer 48 (L) >60 mL/min    Comment: (NOTE) The eGFR has been calculated using the CKD EPI equation. This calculation has not been validated in all clinical situations. eGFR's persistently <60 mL/min signify possible Chronic Kidney Disease.    Anion gap 10 5 - 15    Comment: Performed at [facility], [place] [address], [place], [place] [address]  Sedimentation rate     Status: Abnormal   Collection Time: 12/03/17  7:14 PM  Result Value Ref Range   Sed Rate 126 (H) 0 - 16 mm/hr    Comment: Performed at [facility], [place] [address] [place],  [address]  I-Stat CG4 Lactic Acid, ED     Status: None   Collection Time: 12/03/17  7:20 PM  Result Value Ref Range    Lactic Acid, Venous 1.42 0.5 - 1.9 mmol/L    Ct Abdomen Pelvis W Contrast  Result Date: 12/03/2017 CLINICAL DATA:  Expanding focal erythematous lesion on the right abdomen with drainage, erythema, and increasing pain. Nausea and vomiting. EXAM: CT ABDOMEN AND PELVIS WITH CONTRAST TECHNIQUE: Multidetector CT imaging of the abdomen and pelvis was performed using the standard protocol following bolus administration of intravenous contrast. CONTRAST:  34m OMNIPAQUE IOHEXOL 300 MG/ML  SOLN COMPARISON:  02/24/2016 FINDINGS: Lower chest: Mild patchy interstitial accentuation in the lung bases. 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 Hepatobiliary: Mildly contracted gallbladder. Pancreas: Unremarkable aside from the pancreatic body extending up into the chest as part of a hiatal hernia. Spleen: Unremarkable Adrenals/Urinary Tract: Unremarkable Stomach/Bowel: Large ventral hernia with rectus diastasis. Ventral hernia contains stomach body and antrum, cecum, ascending colon, and transverse colon; and multiple loops of small bowel including the terminal ileum. Along the right side of the hernia, there is an abnormal localized fluid collection measuring approximately 6.1 by 6.9 by 6.4 cm, with gas density along its upper margin tracking out towards the skin. The appearance is suspicious for extraluminal fluid and possibly abscess. The possibility of acute appendicitis with periappendiceal abscess and drainage to the cutaneous surface is a distinct possibility. Postoperative findings in the proximal stomach. Vascular/Lymphatic: Aortoiliac atherosclerotic vascular disease. Reproductive: Unremarkable Other: No supplemental non-categorized findings. Musculoskeletal: 7 mm degenerative anterolisthesis at L4-5 with multilevel spondylosis and degenerative disc disease. Prominent central narrowing of the thecal sac at L4-5. Bilateral direct inguinal hernias contain adipose tissue. IMPRESSION: 1. Abnormal  inflammatory process in the right side of the large ventral hernia sac with abnormal fluid and small amounts of gas which are probably extraluminal compatible with abscess, and extending out to the cutaneous surface along the lateral margin of the hernia. Contained localized perforated appendicitis is a distinct possibility, as this was the location previously occupied by the appendix on the 11/24/2015 CT scan. The large ventral hernia sac contains part of the stomach, the distal ileum, the cecum and ascending colon, and the transverse colon. 2. 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 3. Bilateral direct inguinal hernias contain adipose tissue. 4. Other imaging findings of potential clinical significance: Mild interstitial accentuation in the lung  bases, chronic. Postoperative stone findings in the proximal stomach. Aortic Atherosclerosis (ICD10-I70.0). Prominent central narrowing of the thecal sac at L4-5 due to degenerative anterolisthesis and spurring. These results were called by telephone at the time of interpretation on 12/03/2017 at 8:53 pm to Dr. Duffy Bruce , who verbally acknowledged these results. Electronically Signed   By: Van Clines M.D.   On: 12/03/2017 20:53    Review of Systems  Constitutional: Negative for chills and fever.  HENT: Positive for hearing loss.   Eyes: Negative for blurred vision and double vision.  Respiratory: Negative for cough and hemoptysis.   Cardiovascular: Negative for chest pain and palpitations.  Gastrointestinal: Positive for abdominal pain. Negative for nausea and vomiting.  Genitourinary: Negative for dysuria and urgency.  Musculoskeletal: Negative for myalgias and neck pain.  Skin: Negative for itching and rash.  Neurological: Negative for dizziness, tingling and headaches.  Endo/Heme/Allergies: Does not bruise/bleed easily.  Psychiatric/Behavioral: Negative for depression and suicidal ideas.   Blood pressure 128/66, pulse 64,  temperature 97.7 F (36.5 C), temperature source Oral, resp. rate 16, height '5\' 6"'$  (1.676 m), weight 88.5 kg (195 lb), SpO2 98 %. Physical Exam  Vitals reviewed. Constitutional: He is oriented to person, place, and time. He appears well-developed and well-nourished.  HENT:  Head: Normocephalic and atraumatic.  Eyes: Pupils are equal, round, and reactive to light. Conjunctivae and EOM are normal.  Neck: Normal range of motion. Neck supple.  Cardiovascular: Normal rate and regular rhythm.  Respiratory: Effort normal and breath sounds normal.  GI:  Large incisional hernia making up the majority of his anterior abdominal wall. There is a 4cm area of necrotic skin with thick green purulence draining from the center of this location. The abscess cavity appears to go in the deep and superior direction but does not track freely into any large spaces.  >55m thick purulence drained during the probing  Musculoskeletal: Normal range of motion.  Neurological: He is alert and oriented to person, place, and time.  Skin: Skin is warm and dry.  Psychiatric: He has a normal mood and affect. His behavior is normal.    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 He has severe loss of domain from the size of the hernia. He has no signs of peritonitis. He wants to stay out of the operating room. Large amount of pus drained from probing at bedside -agree with broad spectrum antibiotics to cover enteric bacteria -local wound care with possible Eakin's pouch placement tomorrow -bowel rest  LArta BruceKinsinger 12/03/2017, 10:17 PM

## 2017-12-03 NOTE — ED Notes (Addendum)
Pt stated his wound dressing was leaking through his shirt. EMT first reinforced his dressing with an ABD pad over top of his previous dressing and taped it to his skin with the micropore tape until he can get back to a room for it to be more appropriately treated.

## 2017-12-03 NOTE — ED Triage Notes (Signed)
Patient reports that he had a dime-sized red area on the right abdomen approx 8 days ago and has gotten progressively worse. Patient states he noted green drainage and increased redness and pain since 0100 today.

## 2017-12-03 NOTE — Progress Notes (Signed)
ED TO INPATIENT HANDOFF REPORT  Name/Age/Gender Jack Vance 77 y.o. male  Code Status   Home/SNF/Other Home  Chief Complaint Abd Pain  Level of Care/Admitting Diagnosis ED Disposition    ED Disposition Condition [facility] Area: [place] [100102]  Level of Care: Med-Surg [16]  Diagnosis: Abdominal visceral abscess [facility]) [[id number]]  Admitting Physician: Rise Patience [phone]  Attending Physician: Rise Patience [phone]  Estimated length of stay: past midnight tomorrow  Certification:: I certify this patient will need inpatient services for at least 2 midnights  PT Class (Do Not Modify): Inpatient [101]  PT Acc Code (Do Not Modify): Private [1]       Medical History Past Medical History:  Diagnosis Date  . Arthritis   . Asthma   . DDD (degenerative disc disease), lumbar   . Hernia of abdominal wall   . RLS (restless legs syndrome)     Allergies Allergies  Allergen Reactions  . Cheese     Causes asthma  . Morphine And Related Rash    IV Location/Drains/Wounds Patient Lines/Drains/Airways Status   Active Line/Drains/Airways    Name:   Placement date:   Placement time:   Site:   Days:   Peripheral IV 12/03/17 Right Antecubital   12/03/17    1912    Antecubital   less than 1   Urethral Catheter Latex 14 Fr.   01/26/12    2055    Latex   2138   Urethral Catheter Vonna Kotyk RN 14 Fr.   05/19/13    0349    -   1659          Labs/Imaging Results for orders placed or performed during the hospital encounter of 12/03/17 (from the past 48 hour(s))  CBC with Differential     Status: Abnormal   Collection Time: 12/03/17  7:14 PM  Result Value Ref Range   WBC 14.1 (H) 4.0 - 10.5 K/uL   RBC 3.25 (L) 4.22 - 5.81 MIL/uL   Hemoglobin 10.1 (L) 13.0 - 17.0 g/dL   HCT 31.2 (L) 39.0 - 52.0 %   MCV 96.0 78.0 - 100.0 fL   MCH 31.1 26.0 - 34.0 pg   MCHC 32.4 30.0 - 36.0 g/dL   RDW 13.2 11.5 - 15.5 %   Platelets 316 150  - 400 K/uL   Neutrophils Relative % 77 %   Neutro Abs 10.8 (H) 1.7 - 7.7 K/uL   Lymphocytes Relative 11 %   Lymphs Abs 1.6 0.7 - 4.0 K/uL   Monocytes Relative 9 %   Monocytes Absolute 1.3 (H) 0.1 - 1.0 K/uL   Eosinophils Relative 3 %   Eosinophils Absolute 0.4 0.0 - 0.7 K/uL   Basophils Relative 0 %   Basophils Absolute 0.0 0.0 - 0.1 K/uL    Comment: Performed at [facility], [place] [address] [place], [place] [address]  Comprehensive metabolic panel     Status: Abnormal   Collection Time: 12/03/17  7:14 PM  Result Value Ref Range   Sodium 138 135 - 145 mmol/L   Potassium 4.0 3.5 - 5.1 mmol/L   Chloride 102 98 - 111 mmol/L    Comment: Please note change in reference range.   CO2 26 22 - 32 mmol/L   Glucose, Bld 144 (H) 70 - 99 mg/dL    Comment: Please note change in reference range.   BUN 27 (H) 8 - 23 mg/dL  Comment: Please note change in reference range.   Creatinine, Ser 1.56 (H) 0.61 - 1.24 mg/dL   Calcium 9.1 8.9 - 10.3 mg/dL   Total Protein 7.0 6.5 - 8.1 g/dL   Albumin 3.0 (L) 3.5 - 5.0 g/dL   AST 21 15 - 41 U/L   ALT 34 0 - 44 U/L    Comment: Please note change in reference range.   Alkaline Phosphatase 71 38 - 126 U/L   Total Bilirubin 0.1 (L) 0.3 - 1.2 mg/dL   GFR calc non Af Amer 42 (L) >60 mL/min   GFR calc Af Amer 48 (L) >60 mL/min    Comment: (NOTE) The eGFR has been calculated using the CKD EPI equation. This calculation has not been validated in all clinical situations. eGFR's persistently <60 mL/min signify possible Chronic Kidney Disease.    Anion gap 10 5 - 15    Comment: Performed at [facility], [place] [address] [place], [place] [address]  Sedimentation rate     Status: Abnormal   Collection Time: 12/03/17  7:14 PM  Result Value Ref Range   Sed Rate 126 (H) 0 - 16 mm/hr    Comment: Performed at [facility], [place] [address], [place], [place] [address]  C-reactive protein     Status: Abnormal    Collection Time: 12/03/17  7:14 PM  Result Value Ref Range   CRP 13.4 (H) <1.0 mg/dL    Comment: Performed at [place] [address] [place], [place] [address]  I-Stat CG4 Lactic Acid, ED     Status: None   Collection Time: 12/03/17  7:20 PM  Result Value Ref Range   Lactic Acid, Venous 1.42 0.5 - 1.9 mmol/L   Ct Abdomen Pelvis W Contrast  Result Date: 12/03/2017 CLINICAL DATA:  Expanding focal erythematous lesion on the right abdomen with drainage, erythema, and increasing pain. Nausea and vomiting. EXAM: CT ABDOMEN AND PELVIS WITH CONTRAST TECHNIQUE: Multidetector CT imaging of the abdomen and pelvis was performed using the standard protocol following bolus administration of intravenous contrast. CONTRAST:  67m OMNIPAQUE IOHEXOL 300 MG/ML  SOLN COMPARISON:  02/24/2016 FINDINGS: Lower chest: Mild patchy interstitial accentuation in the lung bases. 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 Hepatobiliary: Mildly contracted gallbladder. Pancreas: Unremarkable aside from the pancreatic body extending up into the chest as part of a hiatal hernia. Spleen: Unremarkable Adrenals/Urinary Tract: Unremarkable Stomach/Bowel: Large ventral hernia with rectus diastasis. Ventral hernia contains stomach body and antrum, cecum, ascending colon, and transverse colon; and multiple loops of small bowel including the terminal ileum. Along the right side of the hernia, there is an abnormal localized fluid collection measuring approximately 6.1 by 6.9 by 6.4 cm, with gas density along its upper margin tracking out towards the skin. The appearance is suspicious for extraluminal fluid and possibly abscess. The possibility of acute appendicitis with periappendiceal abscess and drainage to the cutaneous surface is a distinct possibility. Postoperative findings in the proximal stomach. Vascular/Lymphatic: Aortoiliac atherosclerotic vascular disease. Reproductive: Unremarkable Other: No  supplemental non-categorized findings. Musculoskeletal: 7 mm degenerative anterolisthesis at L4-5 with multilevel spondylosis and degenerative disc disease. Prominent central narrowing of the thecal sac at L4-5. Bilateral direct inguinal hernias contain adipose tissue. IMPRESSION: 1. Abnormal inflammatory process in the right side of the large ventral hernia sac with abnormal fluid and small amounts of gas which are probably extraluminal compatible with abscess, and extending out to the cutaneous surface along the  lateral margin of the hernia. Contained localized perforated appendicitis is a distinct possibility, as this was the location previously occupied by the appendix on the 11/24/2015 CT scan. The large ventral hernia sac contains part of the stomach, the distal ileum, the cecum and ascending colon, and the transverse colon. 2. 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 3. Bilateral direct inguinal hernias contain adipose tissue. 4. Other imaging findings of potential clinical significance: Mild interstitial accentuation in the lung bases, chronic. Postoperative stone findings in the proximal stomach. Aortic Atherosclerosis (ICD10-I70.0). Prominent central narrowing of the thecal sac at L4-5 due to degenerative anterolisthesis and spurring. These results were called by telephone at the time of interpretation on 12/03/2017 at 8:53 pm to Dr. Duffy Bruce , who verbally acknowledged these results. Electronically Signed   By: Van Clines M.D.   On: 12/03/2017 20:53    Pending Labs Unresulted Labs (From admission, onward)   None      Vitals/Pain Today's Vitals   12/03/17 2030 12/03/17 2132 12/03/17 2133 12/03/17 2240  BP: (!) 145/68 128/66 128/66   Pulse: 71 72 64   Resp:   16   Temp:      TempSrc:      SpO2: 99% 99% 98%   Weight:      Height:      PainSc:    10-Worst pain ever    Isolation Precautions No active isolations  Medications Medications   piperacillin-tazobactam (ZOSYN) IVPB 3.375 g (has no administration in time range)  acetaminophen (TYLENOL) tablet 650 mg (has no administration in time range)  sodium chloride 0.9 % bolus 1,000 mL (0 mLs Intravenous Stopped 12/03/17 2013)  piperacillin-tazobactam (ZOSYN) IVPB 3.375 g (0 g Intravenous Stopped 12/03/17 2018)  vancomycin (VANCOCIN) 2,000 mg in sodium chloride 0.9 % 500 mL IVPB (0 mg Intravenous Stopped 12/03/17 2148)  iohexol (OMNIPAQUE) 300 MG/ML solution 75 mL (75 mLs Intravenous Contrast Given 12/03/17 2008)    Mobility walks

## 2017-12-04 ENCOUNTER — Encounter (HOSPITAL_COMMUNITY): Payer: Self-pay | Admitting: Internal Medicine

## 2017-12-04 DIAGNOSIS — G2581 Restless legs syndrome: Secondary | ICD-10-CM | POA: Diagnosis present

## 2017-12-04 DIAGNOSIS — J45909 Unspecified asthma, uncomplicated: Secondary | ICD-10-CM | POA: Diagnosis present

## 2017-12-04 DIAGNOSIS — K651 Peritoneal abscess: Secondary | ICD-10-CM

## 2017-12-04 DIAGNOSIS — D649 Anemia, unspecified: Secondary | ICD-10-CM | POA: Diagnosis present

## 2017-12-04 DIAGNOSIS — E785 Hyperlipidemia, unspecified: Secondary | ICD-10-CM | POA: Diagnosis present

## 2017-12-04 DIAGNOSIS — L02211 Cutaneous abscess of abdominal wall: Secondary | ICD-10-CM | POA: Diagnosis present

## 2017-12-04 DIAGNOSIS — K3532 Acute appendicitis with perforation and localized peritonitis, without abscess: Secondary | ICD-10-CM

## 2017-12-04 LAB — CBC WITH DIFFERENTIAL/PLATELET
BASOS PCT: 0 %
Basophils Absolute: 0 10*3/uL (ref 0.0–0.1)
EOS PCT: 0 %
Eosinophils Absolute: 0 10*3/uL (ref 0.0–0.7)
HEMATOCRIT: 34.1 % — AB (ref 39.0–52.0)
Hemoglobin: 11.3 g/dL — ABNORMAL LOW (ref 13.0–17.0)
LYMPHS PCT: 7 %
Lymphs Abs: 1.1 10*3/uL (ref 0.7–4.0)
MCH: 31 pg (ref 26.0–34.0)
MCHC: 33.1 g/dL (ref 30.0–36.0)
MCV: 93.4 fL (ref 78.0–100.0)
MONO ABS: 1.7 10*3/uL — AB (ref 0.1–1.0)
MONOS PCT: 11 %
Neutro Abs: 12.3 10*3/uL — ABNORMAL HIGH (ref 1.7–7.7)
Neutrophils Relative %: 82 %
Platelets: 347 10*3/uL (ref 150–400)
RBC: 3.65 MIL/uL — AB (ref 4.22–5.81)
RDW: 13.3 % (ref 11.5–15.5)
WBC: 15.1 10*3/uL — AB (ref 4.0–10.5)

## 2017-12-04 LAB — COMPREHENSIVE METABOLIC PANEL
ALT: 32 U/L (ref 0–44)
ANION GAP: 12 (ref 5–15)
AST: 18 U/L (ref 15–41)
Albumin: 2.9 g/dL — ABNORMAL LOW (ref 3.5–5.0)
Alkaline Phosphatase: 73 U/L (ref 38–126)
BUN: 19 mg/dL (ref 8–23)
CO2: 25 mmol/L (ref 22–32)
CREATININE: 1.21 mg/dL (ref 0.61–1.24)
Calcium: 9.3 mg/dL (ref 8.9–10.3)
Chloride: 104 mmol/L (ref 98–111)
GFR, EST NON AFRICAN AMERICAN: 57 mL/min — AB (ref >60)
Glucose, Bld: 94 mg/dL (ref 70–99)
POTASSIUM: 5 mmol/L (ref 3.5–5.1)
SODIUM: 141 mmol/L (ref 135–145)
Total Bilirubin: 0.3 mg/dL (ref 0.3–1.2)
Total Protein: 7.2 g/dL (ref 6.5–8.1)

## 2017-12-04 LAB — IRON AND TIBC
Iron: 28 ug/dL — ABNORMAL LOW (ref 45–182)
SATURATION RATIOS: 11 % — AB (ref 17.9–39.5)
TIBC: 252 ug/dL (ref 250–450)
UIBC: 224 ug/dL

## 2017-12-04 LAB — RETICULOCYTES
RBC.: 3.66 MIL/uL — AB (ref 4.22–5.81)
RETIC CT PCT: 0.7 % (ref 0.4–3.1)
Retic Count, Absolute: 25.6 10*3/uL (ref 19.0–186.0)

## 2017-12-04 LAB — VITAMIN B12: VITAMIN B 12: 735 pg/mL (ref 180–914)

## 2017-12-04 LAB — FERRITIN: Ferritin: 136 ng/mL (ref 24–336)

## 2017-12-04 LAB — FOLATE: Folate: 31.3 ng/mL (ref >5.9)

## 2017-12-04 MED ORDER — VANCOMYCIN HCL IN DEXTROSE 1-5 GM/200ML-% IV SOLN
1000.0000 mg | INTRAVENOUS | Status: DC
  Administered 2017-12-04 – 2017-12-06 (×3): 1000 mg via INTRAVENOUS
  Filled 2017-12-04 (×3): qty 200

## 2017-12-04 MED ORDER — LIDOCAINE 5 % EX PTCH
1.0000 | MEDICATED_PATCH | CUTANEOUS | Status: DC
  Administered 2017-12-06 – 2017-12-09 (×4): 1 via TRANSDERMAL
  Filled 2017-12-04 (×6): qty 1

## 2017-12-04 MED ORDER — DEXTROSE-NACL 5-0.9 % IV SOLN
INTRAVENOUS | Status: DC
  Administered 2017-12-04: 08:00:00 via INTRAVENOUS

## 2017-12-04 MED ORDER — CLONAZEPAM 1 MG PO TABS
1.0000 mg | ORAL_TABLET | Freq: Two times a day (BID) | ORAL | Status: DC
  Administered 2017-12-04 – 2017-12-09 (×11): 1 mg via ORAL
  Filled 2017-12-04 (×11): qty 1

## 2017-12-04 MED ORDER — MOMETASONE FURO-FORMOTEROL FUM 200-5 MCG/ACT IN AERO
2.0000 | INHALATION_SPRAY | Freq: Two times a day (BID) | RESPIRATORY_TRACT | Status: DC
  Administered 2017-12-05 – 2017-12-09 (×9): 2 via RESPIRATORY_TRACT
  Filled 2017-12-04: qty 8.8

## 2017-12-04 MED ORDER — ONDANSETRON HCL 4 MG/2ML IJ SOLN: 4.0000 mg | Freq: Four times a day (QID) | INTRAMUSCULAR | Status: DC | PRN

## 2017-12-04 MED ORDER — ROPINIROLE HCL 1 MG PO TABS
4.0000 mg | ORAL_TABLET | Freq: Once | ORAL | Status: AC
  Administered 2017-12-04: 4 mg via ORAL
  Filled 2017-12-04: qty 4

## 2017-12-04 MED ORDER — IPRATROPIUM-ALBUTEROL 0.5-2.5 (3) MG/3ML IN SOLN
3.0000 mL | Freq: Four times a day (QID) | RESPIRATORY_TRACT | Status: DC
  Filled 2017-12-04: qty 3

## 2017-12-04 MED ORDER — ROPINIROLE HCL 1 MG PO TABS
4.0000 mg | ORAL_TABLET | Freq: Three times a day (TID) | ORAL | Status: DC
  Administered 2017-12-04 – 2017-12-09 (×16): 4 mg via ORAL
  Filled 2017-12-04 (×2): qty 4
  Filled 2017-12-04: qty 8
  Filled 2017-12-04 (×2): qty 4
  Filled 2017-12-04: qty 8
  Filled 2017-12-04 (×10): qty 4

## 2017-12-04 MED ORDER — IPRATROPIUM-ALBUTEROL 0.5-2.5 (3) MG/3ML IN SOLN
3.0000 mL | Freq: Four times a day (QID) | RESPIRATORY_TRACT | Status: DC | PRN
  Administered 2017-12-05: 3 mL via RESPIRATORY_TRACT
  Filled 2017-12-04 (×2): qty 3

## 2017-12-04 MED ORDER — IPRATROPIUM-ALBUTEROL 20-100 MCG/ACT IN AERS: 1.0000 | INHALATION_SPRAY | Freq: Four times a day (QID) | RESPIRATORY_TRACT | Status: DC

## 2017-12-04 MED ORDER — GABAPENTIN 400 MG PO CAPS
400.0000 mg | ORAL_CAPSULE | Freq: Two times a day (BID) | ORAL | Status: DC
  Administered 2017-12-04 – 2017-12-09 (×11): 400 mg via ORAL
  Filled 2017-12-04 (×13): qty 1

## 2017-12-04 MED ORDER — SODIUM CHLORIDE 0.9 % IV SOLN
INTRAVENOUS | Status: DC
  Administered 2017-12-04 – 2017-12-07 (×2): via INTRAVENOUS
  Administered 2017-12-08: 1 mL via INTRAVENOUS

## 2017-12-04 MED ORDER — TAMSULOSIN HCL 0.4 MG PO CAPS
0.4000 mg | ORAL_CAPSULE | Freq: Every day | ORAL | Status: DC
  Administered 2017-12-04 – 2017-12-09 (×6): 0.4 mg via ORAL
  Filled 2017-12-04 (×6): qty 1

## 2017-12-04 MED ORDER — ACETAMINOPHEN 650 MG RE SUPP: 650.0000 mg | Freq: Four times a day (QID) | RECTAL | Status: DC | PRN

## 2017-12-04 MED ORDER — ATORVASTATIN CALCIUM 40 MG PO TABS
80.0000 mg | ORAL_TABLET | Freq: Every day | ORAL | Status: DC
  Administered 2017-12-04 – 2017-12-09 (×6): 80 mg via ORAL
  Filled 2017-12-04 (×6): qty 2

## 2017-12-04 MED ORDER — FINASTERIDE 5 MG PO TABS
5.0000 mg | ORAL_TABLET | Freq: Every day | ORAL | Status: DC
  Administered 2017-12-04 – 2017-12-09 (×6): 5 mg via ORAL
  Filled 2017-12-04 (×6): qty 1

## 2017-12-04 MED ORDER — FLUOXETINE HCL 20 MG PO CAPS
20.0000 mg | ORAL_CAPSULE | Freq: Every day | ORAL | Status: DC
  Administered 2017-12-04 – 2017-12-09 (×6): 20 mg via ORAL
  Filled 2017-12-04 (×6): qty 1

## 2017-12-04 MED ORDER — FLUTICASONE PROPIONATE 50 MCG/ACT NA SUSP
1.0000 | Freq: Every day | NASAL | Status: DC
  Administered 2017-12-04 – 2017-12-09 (×6): 1 via NASAL
  Filled 2017-12-04 (×2): qty 16

## 2017-12-04 MED ORDER — GABAPENTIN 400 MG PO CAPS
800.0000 mg | ORAL_CAPSULE | Freq: Every day | ORAL | Status: DC
  Administered 2017-12-04 – 2017-12-08 (×5): 800 mg via ORAL
  Filled 2017-12-04 (×5): qty 2

## 2017-12-04 MED ORDER — ONDANSETRON HCL 4 MG PO TABS: 4.0000 mg | ORAL_TABLET | Freq: Four times a day (QID) | ORAL | Status: DC | PRN

## 2017-12-04 MED ORDER — ACETAMINOPHEN 325 MG PO TABS
650.0000 mg | ORAL_TABLET | Freq: Four times a day (QID) | ORAL | Status: DC | PRN
  Administered 2017-12-04 – 2017-12-07 (×10): 650 mg via ORAL
  Filled 2017-12-04 (×10): qty 2

## 2017-12-04 NOTE — Progress Notes (Signed)
Jack Olp, MD was paged per the pt's request to speak to him about having surgery. Pt reported that he originally declined any surgical procedures this morning, but has now decided that he would like to proceed with having surgery.

## 2017-12-04 NOTE — Progress Notes (Signed)
Carrier fluid protocol initiated d/t orders for IV antibiotics.

## 2017-12-04 NOTE — Progress Notes (Signed)
  PROGRESS NOTE  Patient admitted earlier this morning. See H&P. Jack Vance is a 77 y.o. male with history of asthma, hyperlipidemia, restless leg syndrome, chronic kidney disease stage III and ventral hernia who started noticing swelling around his abdomen in the right lower quadrant around a week ago which slowly gave way and started having discharge purulent with some blood colored. This has been progressively worsening and patient decided to come to the ER. 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 General surgery was consulted.      General surgery following Patient not interested in surgical intervention at this time IR consulted for drain and residual collection for source control Continue vancomycin, Zosyn  Noralee Stain, DO [facility] [email or web address] Password [facility] 12/04/2017, 12:24 PM

## 2017-12-04 NOTE — Consult Note (Signed)
WOC Nurse wound consult note Assessment completed in [facility] 1321.  No family present.  The patient and I spoke at length about placing a pouch over the abdominal wound.  The issue with that at this time, is that the patient is going to IR for drainage of the abscess.  If I place a pouch over it now, it will certainly be removed for the IR procedure.  The patient's skin is very erythematous and hurts when it is touched.  In collaboration with the patient, the decision was made to postpone pouching the wound until after the IR procedure.  Additionally, after our conversation the patient is agreeable to discussing surgical intervention for the condition and wishes to speak with a surgeon again.  I have notified his primary care RN, Marianna Fuss of these developments.  She will reach out and notify the surgeon of the patient's decision.  For now, I have entered an order for a foam dressing to the area.  The silicone border will be more gentle on his skin, and it will absorb drainage.  My plan is to follow up post IR, which may mean I will re-evaluate and possibly pouch the area tomorrow. Monitor the wound area(s) for worsening of condition such as: Signs/symptoms of infection,  Increase in size,  Development of or worsening of odor, Development of pain, or increased pain at the affected locations.  Notify the medical team if any of these develop.  Helmut Muster, RN, MSN, CWOCN, CNS-BC, pager [phone]

## 2017-12-04 NOTE — Progress Notes (Signed)
Subjective No acute events since admission. Reports feeling about the same. Denies worsening abdominal pain, nausea or vomiting since admission.  Objective: Vital signs in last 24 hours: Temp:  [97.3 F (36.3 C)-98.2 F (36.8 C)] 98.2 F (36.8 C) (07/17 0612) Pulse Rate:  [64-102] 102 (07/17 0612) Resp:  [16-18] 16 (07/17 0612) BP: (115-155)/(51-88) 149/88 (07/17 0612) SpO2:  [94 %-100 %] 97 % (07/17 0612) Weight:  [88.5 kg (195 lb)] 88.5 kg (195 lb) (07/16 1110) Last BM Date: 12/03/17  Intake/Output from previous day: 07/16 0701 - 07/17 0700 In: 1550.4 [IV Piggyback:1550.4] Out: 490 [Urine:490] Intake/Output this shift: No intake/output data recorded.  Gen: NAD, comfortable CV: RRR Pulm: Normal work of breathing Abd: Soft, obese, minimally ttp along right abdominal wall where wound is. No tenderness elsewhere. Large ventral hernia, nontender. Not significantly distended. Purulent drainage from right abdominal wall - ~3x3cm eschar with surrounding blanching erythema Ext: No pitting edema  Lab Results: CBC  Recent Labs    12/03/17 1914 12/04/17 0620  WBC 14.1* 15.1*  HGB 10.1* 11.3*  HCT 31.2* 34.1*  PLT 316 347   BMET Recent Labs    12/03/17 1914 12/04/17 0620  NA 138 141  K 4.0 5.0  CL 102 104  CO2 26 25  GLUCOSE 144* 94  BUN 27* 19  CREATININE 1.56* 1.21  CALCIUM 9.1 9.3   PT/INR No results for input(s): LABPROT, INR in the last 72 hours. ABG No results for input(s): PHART, HCO3 in the last 72 hours.  Invalid input(s): PCO2, PO2  Studies/Results:  Anti-infectives: Anti-infectives (From admission, onward)   Start     Dose/Rate Route Frequency Ordered Stop   12/04/17 0400  piperacillin-tazobactam (ZOSYN) IVPB 3.375 g     3.375 g 12.5 mL/hr over 240 Minutes Intravenous Every 8 hours 12/03/17 2244     12/03/17 1930  vancomycin (VANCOCIN) 2,000 mg in sodium chloride 0.9 % 500 mL IVPB     2,000 mg 250 mL/hr over 120 Minutes Intravenous  Once 12/03/17  1855 12/03/17 2148   12/03/17 1900  piperacillin-tazobactam (ZOSYN) IVPB 3.375 g     3.375 g 100 mL/hr over 30 Minutes Intravenous  Once 12/03/17 1845 12/03/17 2018       Assessment/Plan: Patient Active Problem List   Diagnosis Date Noted  . Ruptured appendicitis 12/04/2017  . Restless leg syndrome 12/04/2017  . Normocytic normochromic anemia 12/04/2017  . Asthma 12/04/2017  . HLD (hyperlipidemia) 12/04/2017  . Abdominal wall abscess 12/04/2017  . Abdominal visceral abscess (HCC) 12/03/2017   Jack Vance is a 76yoM with new possible ECF vs ruptured appendicitis reaching the skin because of the appendix location in the hernia sac. He has severe loss of domain from the size of the hernia. He has no signs of peritonitis. He wants to stay out of the operating room. Large amount of pus drained from probing at bedside  -I had a long discussion with him this morning. We discussed the anatomy and physiology of the GI tract, pathophysiology of hernias and appendicitis. We discussed that he has an abscess in the vicinity of his cecum/appendix which has been draining from his abdominal wall now for ~10 days. There is an eschar where this has been draining. We discussed potential operative approaches to this including debridement of the eschar with the potential need for laparotomy, possible bowel resection. He said under no circumstances including life threatening would he entertain proceeding with any additional surgery due to the complications he has experienced with prior  surgeries. He has requested we pursue the maximal medical management of his condition. I discussed option for IR to attempt to drain any residual collection to aid in source control and he is amenable to this. -Will plan for NPO for possible procedure with IR, IVF, broad spec  IV abx - Vanc + Zosyn -Wound ostomy nurse consultation placed for pouching to aid in wound care and skin protection. Appliance ideally cut large enough so as not  to cover eschar. -Will continue to follow with you   LOS: 1 day   Stephanie Coup. Cliffton Asters, M.D. [facility]

## 2017-12-04 NOTE — Progress Notes (Signed)
Pharmacy Antibiotic Note  Jack Vance is a 77 y.o. 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  Pharmacy has been consulted for vancomycin and zosyn dosing.  Vancomycin 2 gm given 7/16 at 1948.  Zosyn started 7/16 at 1948. WBC 15.1, SCr 1.56>1.21.  IR  Consulted for possible drain of abscess.    Plan: Zosyn 3.375g IV q8h (4 hour infusion).  Vancomycin 2 gm given 7/16 at 1948 then vancomycin 1 gm IV q24 for AUC 476 using SCr 1.21, IBW/ABW F/u renal function, WBC, temp, culture data if sent ( none ordered so far) Vancomycin levels as needed  Height: 5\' 6"  (167.6 cm) Weight: 195 lb (88.5 kg) IBW/kg (Calculated) : 63.8  Temp (24hrs), Avg:97.8 F (36.6 C), Min:97.3 F (36.3 C), Max:98.5 F (36.9 C)  Recent Labs  Lab 12/03/17 1914 12/03/17 1920 12/04/17 0620  WBC 14.1*  --  15.1*  CREATININE 1.56*  --  1.21  LATICACIDVEN  --  1.42  --     Estimated Creatinine Clearance: 55 mL/min (by C-G formula based on SCr of 1.21 mg/dL).    Allergies  Allergen Reactions  . Cheese     Causes asthma  . Morphine And Related Rash    Antimicrobials this admission: 7/16 vanc>> 7/16 zosyn >> Dose adjustments this admission:  Microbiology results: 7/17 none ordered  Thank you for allowing pharmacy to be a part of this patient's care.  Herby Abraham, Pharm.D 12/04/2017 12:54 PM

## 2017-12-04 NOTE — H&P (Signed)
Chief Complaint: Abdominal abscess  Referring Physician(s): Marin Olp  Supervising Physician: Richarda Overlie  Patient Status: [facility] - In-pt  History of Present Illness: Jack Vance is a 77 y.o. male with a large chronic abdominal hernia which he states he has had for "many years".  He developed redness and an open wound at the lateral aspect and reports it has an odor and has been draining.  CT scan showed = Abnormal inflammatory process in the right side of the large ventral hernia sac with abnormal fluid and small amounts of gas which are probably extraluminal compatible with abscess, and extending out to the cutaneous surface along the lateral margin of the hernia. Contained localized perforated appendicitis is a distinct possibility, as this was the location previously occupied by the appendix on the 11/24/2015 CT scan.   We are asked to drain the fluid collection.  Past Medical History:  Diagnosis Date  . Arthritis   . Asthma   . DDD (degenerative disc disease), lumbar   . Hernia of abdominal wall   . RLS (restless legs syndrome)     Past Surgical History:  Procedure Laterality Date  . HERNIA REPAIR    . SHOULDER SURGERY      Allergies: Cheese and Morphine and related  Medications: Prior to Admission medications   Medication Sig Start Date End Date Taking? Authorizing Provider  atorvastatin (LIPITOR) 80 MG tablet Take 80 mg by mouth daily.   Yes [provider]  budesonide-formoterol (SYMBICORT) 160-4.5 MCG/ACT inhaler Inhale 2 puffs into the lungs 2 (two) times daily.   Yes [provider]  clonazePAM (KLONOPIN) 1 MG tablet Take 1 mg by mouth 2 (two) times daily.   Yes [provider]  finasteride (PROSCAR) 5 MG tablet Take 5 mg by mouth daily.   Yes [provider]  FLUoxetine (PROZAC) 20 MG capsule Take 20 mg by mouth daily.   Yes [provider]  fluticasone (FLONASE) 50 MCG/ACT nasal spray Place 1  spray into both nostrils daily.   Yes [provider]  gabapentin (NEURONTIN) 400 MG capsule Take 400-800 mg by mouth 3 (three) times daily. Take 1 capsule BID and Take 2 capsules QHS   Yes [provider]  Ipratropium-Albuterol (COMBIVENT RESPIMAT) 20-100 MCG/ACT AERS respimat Inhale 1 puff into the lungs every 6 (six) hours.   Yes [provider]  lidocaine (LIDODERM) 5 % Place 1 patch onto the skin daily. Remove & Discard patch within 12 hours or as directed by MD   Yes [provider]  rOPINIRole (REQUIP) 2 MG tablet Take 4 mg by mouth 3 (three) times daily.   Yes [provider]  tamsulosin (FLOMAX) 0.4 MG CAPS capsule Take 0.4 mg by mouth daily.   Yes [provider]  triamterene-hydrochlorothiazide (MAXZIDE-25) 37.5-25 MG tablet Take 1 tablet by mouth daily.   Yes [provider]  benzonatate (TESSALON) 100 MG capsule Take 100 mg by mouth 3 (three) times daily as needed for cough.    [provider]     Family History  Problem Relation Age of Onset  . Hypertension Maternal Aunt     Social History   Socioeconomic History  . Marital status: Single    Spouse name: Not on file  . Number of children: Not on file  . Years of education: Not on file  . Highest education level: Not on file  Occupational History  . Not on file  Social Needs  .  Financial resource strain: Not on file  . Food insecurity:    Worry: Not on file    Inability: Not on file  . Transportation needs:    Medical: Not on file    Non-medical: Not on file  Tobacco Use  . Smoking status: Never Smoker  . Smokeless tobacco: Never Used  Substance and Sexual Activity  . Alcohol use: Yes    Comment: 4x week  . Drug use: No  . Sexual activity: Not on file  Lifestyle  . Physical activity:    Days per week: Not on file    Minutes per session: Not on file  . Stress: Not on file  Relationships  . Social connections:    Talks on phone: Not on  file    Gets together: Not on file    Attends religious service: Not on file    Active member of club or organization: Not on file    Attends meetings of clubs or organizations: Not on file    Relationship status: Not on file  Other Topics Concern  . Not on file  Social History Narrative  . Not on file     Review of Systems: A 12 point ROS discussed and pertinent positives are indicated in the HPI above.  All other systems are negative.  Review of Systems  Vital Signs: BP (!) 152/89 (BP Location: Left Arm)   Pulse 97   Temp 98.5 F (36.9 C) (Oral)   Resp 18   Ht 5\' 6"  (1.676 m)   Wt 195 lb (88.5 kg)   SpO2 96%   BMI 31.47 kg/m   Physical Exam  Constitutional: He is oriented to person, place, and time. He appears well-developed.  HENT:  Head: Normocephalic and atraumatic.  Eyes: EOM are normal.  Neck: Normal range of motion.  Cardiovascular: Normal rate, regular rhythm and normal heart sounds.  Pulmonary/Chest: Effort normal and breath sounds normal.  Abdominal:    Purple = Large ventral hernia Red = open wound with black necrotic tissue and green drainage in the wound bed.  Musculoskeletal: Normal range of motion.  Neurological: He is alert and oriented to person, place, and time.  Skin: Skin is warm and dry.  Psychiatric: He has a normal mood and affect. His behavior is normal. Judgment and thought content normal.  Vitals reviewed.   Imaging: Ct Abdomen Pelvis W Contrast  Result Date: 12/03/2017 CLINICAL DATA:  Expanding focal erythematous lesion on the right abdomen with drainage, erythema, and increasing pain. Nausea and vomiting. EXAM: CT ABDOMEN AND PELVIS WITH CONTRAST TECHNIQUE: Multidetector CT imaging of the abdomen and pelvis was performed using the standard protocol following bolus administration of intravenous contrast. CONTRAST:  43mL OMNIPAQUE IOHEXOL 300 MG/ML  SOLN COMPARISON:  02/24/2016 FINDINGS: Lower chest: Mild patchy interstitial accentuation  in the lung bases. 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 Hepatobiliary: Mildly contracted gallbladder. Pancreas: Unremarkable aside from the pancreatic body extending up into the chest as part of a hiatal hernia. Spleen: Unremarkable Adrenals/Urinary Tract: Unremarkable Stomach/Bowel: Large ventral hernia with rectus diastasis. Ventral hernia contains stomach body and antrum, cecum, ascending colon, and transverse colon; and multiple loops of small bowel including the terminal ileum. Along the right side of the hernia, there is an abnormal localized fluid collection measuring approximately 6.1 by 6.9 by 6.4 cm, with gas density along its upper margin tracking out towards the skin. The appearance is suspicious for extraluminal fluid and possibly abscess. The possibility  of acute appendicitis with periappendiceal abscess and drainage to the cutaneous surface is a distinct possibility. Postoperative findings in the proximal stomach. Vascular/Lymphatic: Aortoiliac atherosclerotic vascular disease. Reproductive: Unremarkable Other: No supplemental non-categorized findings. Musculoskeletal: 7 mm degenerative anterolisthesis at L4-5 with multilevel spondylosis and degenerative disc disease. Prominent central narrowing of the thecal sac at L4-5. Bilateral direct inguinal hernias contain adipose tissue. IMPRESSION: 1. Abnormal inflammatory process in the right side of the large ventral hernia sac with abnormal fluid and small amounts of gas which are probably extraluminal compatible with abscess, and extending out to the cutaneous surface along the lateral margin of the hernia. Contained localized perforated appendicitis is a distinct possibility, as this was the location previously occupied by the appendix on the 11/24/2015 CT scan. The large ventral hernia sac contains part of the stomach, the distal ileum, the cecum and ascending colon, and the transverse colon. 2. 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 3. Bilateral direct inguinal hernias contain adipose tissue. 4. Other imaging findings of potential clinical significance: Mild interstitial accentuation in the lung bases, chronic. Postoperative stone findings in the proximal stomach. Aortic Atherosclerosis (ICD10-I70.0). Prominent central narrowing of the thecal sac at L4-5 due to degenerative anterolisthesis and spurring. These results were called by telephone at the time of interpretation on 12/03/2017 at 8:53 pm to Dr. Shaune Pollack , who verbally acknowledged these results. Electronically Signed   By: Gaylyn Rong M.D.   On: 12/03/2017 20:53    Labs:  CBC: Recent Labs    12/03/17 1914 12/04/17 0620  WBC 14.1* 15.1*  HGB 10.1* 11.3*  HCT 31.2* 34.1*  PLT 316 347    COAGS: No results for input(s): INR, APTT in the last 8760 hours.  BMP: Recent Labs    12/03/17 1914 12/04/17 0620  NA 138 141  K 4.0 5.0  CL 102 104  CO2 26 25  GLUCOSE 144* 94  BUN 27* 19  CALCIUM 9.1 9.3  CREATININE 1.56* 1.21  GFRNONAA 42* 57*  GFRAA 48* >60    LIVER FUNCTION TESTS: Recent Labs    12/03/17 1914 12/04/17 0620  BILITOT 0.1* 0.3  AST 21 18  ALT 34 32  ALKPHOS 71 73  PROT 7.0 7.2  ALBUMIN 3.0* 2.9*    TUMOR MARKERS: No results for input(s): AFPTM, CEA, CA199, CHROMGRNA in the last 8760 hours.  Assessment and Plan:  Ruptured appendix with right abdominal fluid collection and open wound.  Images reviewed by Dr. Lowella Dandy.  Will plan for US guided abscess drain tomorrow.  NPO after MN.  Risks and benefits discussed with the patient including bleeding, infection, damage to adjacent structures, bowel perforation/fistula connection, and sepsis.  All of the patient's questions were answered, patient is agreeable to proceed. Consent signed and in chart.  Thank you for this interesting consult.  I greatly enjoyed meeting Jack Vance and look forward to participating in their care.  A copy of  this report was sent to the requesting provider on this date.  Electronically Signed: Gwynneth Macleod, PA-C   12/04/2017, 1:15 PM      I spent a total of 40 Minutes in face to face in clinical consultation, greater than 50% of which was counseling/coordinating care for US guided drain.

## 2017-12-04 NOTE — H&P (Signed)
History and Physical    RONAL MAYBURY Vance:[id number] DOB: [birth year] DOA: 12/03/2017  PCP: Patient, No Pcp Per  Patient coming from: Home.  Chief Complaint: Drainage from abdominal wall.  HPI: Jack Vance is a 77 y.o. male with history of asthma, hyperlipidemia, restless leg syndrome, chronic kidney disease stage III and ventral hernia started noticing swelling around his abdomen in the right lower quadrant around a week ago which slowly gave way and started having discharge purulent with some blood colored.  This has been progressively worsening and patient decided to come to the ER.  Denies any nausea vomiting diarrhea fever or chills.  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  On-call general surgeon has been consulted patient placed on antibiotics and admitted for further observation.  Review of Systems: As per HPI, rest all negative.   Past Medical History:  Diagnosis Date  . Arthritis   . Asthma   . DDD (degenerative disc disease), lumbar   . Hernia of abdominal wall   . RLS (restless legs syndrome)     Past Surgical History:  Procedure Laterality Date  . HERNIA REPAIR    . SHOULDER SURGERY       reports that he has never smoked. He has never used smokeless tobacco. He reports that he drinks alcohol. He reports that he does not use drugs.  Allergies  Allergen Reactions  . Cheese     Causes asthma  . Morphine And Related Rash    Family History  Problem Relation Age of Onset  . Hypertension Maternal Aunt     Prior to Admission medications   Medication Sig Start Date End Date Taking? Authorizing Provider  atorvastatin (LIPITOR) 80 MG tablet Take 80 mg by mouth daily.   Yes [provider]  budesonide-formoterol (SYMBICORT) 160-4.5 MCG/ACT inhaler Inhale 2 puffs into the lungs 2 (two) times daily.   Yes [provider]  clonazePAM (KLONOPIN) 1 MG tablet Take 1 mg by  mouth 2 (two) times daily.   Yes [provider]  finasteride (PROSCAR) 5 MG tablet Take 5 mg by mouth daily.   Yes [provider]  FLUoxetine (PROZAC) 20 MG capsule Take 20 mg by mouth daily.   Yes [provider]  fluticasone (FLONASE) 50 MCG/ACT nasal spray Place 1 spray into both nostrils daily.   Yes [provider]  gabapentin (NEURONTIN) 400 MG capsule Take 400-800 mg by mouth 3 (three) times daily. Take 1 capsule BID and Take 2 capsules QHS   Yes [provider]  Ipratropium-Albuterol (COMBIVENT RESPIMAT) 20-100 MCG/ACT AERS respimat Inhale 1 puff into the lungs every 6 (six) hours.   Yes [provider]  lidocaine (LIDODERM) 5 % Place 1 patch onto the skin daily. Remove & Discard patch within 12 hours or as directed by MD   Yes [provider]  rOPINIRole (REQUIP) 2 MG tablet Take 4 mg by mouth 3 (three) times daily.   Yes [provider]  tamsulosin (FLOMAX) 0.4 MG CAPS capsule Take 0.4 mg by mouth daily.   Yes [provider]  triamterene-hydrochlorothiazide (MAXZIDE-25) 37.5-25 MG tablet Take 1 tablet by mouth daily.   Yes [provider]  benzonatate (TESSALON) 100 MG capsule Take 100 mg by mouth 3 (three) times daily as needed for cough.    [provider]    Physical Exam: Vitals:   12/03/17 2133 12/03/17 2200 12/03/17 2300  12/04/17 0008  BP: 128/66 (!) 149/79 (!) 115/51 131/76  Pulse: 64 78  77  Resp: 16   16  Temp:    (!) 97.5 F (36.4 C)  TempSrc:    Oral  SpO2: 98% 100%  100%  Weight:      Height:          Constitutional: Moderately built and nourished. Vitals:   12/03/17 2133 12/03/17 2200 12/03/17 2300 12/04/17 0008  BP: 128/66 (!) 149/79 (!) 115/51 131/76  Pulse: 64 78  77  Resp: 16   16  Temp:    (!) 97.5 F (36.4 C)  TempSrc:    Oral  SpO2: 98% 100%  100%  Weight:      Height:       Eyes: Anicteric no pallor. ENMT: No discharge from the ears eyes nose  or mouth. Neck: No mass felt.  No neck rigidity no JVD appreciated. Respiratory: No rhonchi or crepitations. Cardiovascular: S1-S2 heard no murmurs appreciated. Abdomen: Soft nontender bowel sounds present.  Large ventral hernia and right lower quadrant has been dressed. Musculoskeletal: No edema.  No joint effusion. 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 Neurologic: Alert awake oriented to time place and person.  Moves all extremities. Psychiatric: Appears normal per normal affect.   Labs on Admission: I have personally reviewed following labs and imaging studies  CBC: Recent Labs  Lab 12/03/17 1914  WBC 14.1*  NEUTROABS 10.8*  HGB 10.1*  HCT 31.2*  MCV 96.0  PLT 316   Basic Metabolic Panel: Recent Labs  Lab 12/03/17 1914  NA 138  K 4.0  CL 102  CO2 26  GLUCOSE 144*  BUN 27*  CREATININE 1.56*  CALCIUM 9.1   GFR: Estimated Creatinine Clearance: 42.7 mL/min (A) (by C-G formula based on SCr of 1.56 mg/dL (H)). Liver Function Tests: Recent Labs  Lab 12/03/17 1914  AST 21  ALT 34  ALKPHOS 71  BILITOT 0.1*  PROT 7.0  ALBUMIN 3.0*   No results for input(s): LIPASE, AMYLASE in the last 168 hours. No results for input(s): AMMONIA in the last 168 hours. Coagulation Profile: No results for input(s): INR, PROTIME in the last 168 hours. Cardiac Enzymes: No results for input(s): CKTOTAL, CKMB, CKMBINDEX, TROPONINI in the last 168 hours. BNP (last 3 results) No results for input(s): PROBNP in the last 8760 hours. HbA1C: No results for input(s): HGBA1C in the last 72 hours. CBG: No results for input(s): GLUCAP in the last 168 hours. Lipid Profile: No results for input(s): CHOL, HDL, LDLCALC, TRIG, CHOLHDL, LDLDIRECT in the last 72 hours. Thyroid Function Tests: No results for input(s): TSH, T4TOTAL, FREET4, T3FREE, THYROIDAB in the last 72 hours. Anemia Panel: No results for input(s): VITAMINB12, FOLATE, FERRITIN, TIBC, IRON, RETICCTPCT in  the last 72 hours. Urine analysis:    Component Value Date/Time   COLORURINE YELLOW 05/19/2013 0613   APPEARANCEUR HAZY (A) 05/19/2013 0613   LABSPEC 1.023 05/19/2013 0613   PHURINE 6.0 05/19/2013 0613   GLUCOSEU NEGATIVE 05/19/2013 0613   HGBUR NEGATIVE 05/19/2013 0613   BILIRUBINUR NEGATIVE 05/19/2013 0613   KETONESUR 15 (A) 05/19/2013 0613   PROTEINUR NEGATIVE 05/19/2013 0613   UROBILINOGEN 0.2 05/19/2013 0613   NITRITE NEGATIVE 05/19/2013 0613   LEUKOCYTESUR NEGATIVE 05/19/2013 0613   Sepsis Labs: @LABRCNTIP (procalcitonin:4,lacticidven:4) )No results found for this or any previous visit (from the past 240 hour(s)).   Radiological Exams on Admission: Ct Abdomen Pelvis W Contrast  Result Date: 12/03/2017 CLINICAL  DATA:  Expanding focal erythematous lesion on the right abdomen with drainage, erythema, and increasing pain. Nausea and vomiting. EXAM: CT ABDOMEN AND PELVIS WITH CONTRAST TECHNIQUE: Multidetector CT imaging of the abdomen and pelvis was performed using the standard protocol following bolus administration of intravenous contrast. CONTRAST:  3mL OMNIPAQUE IOHEXOL 300 MG/ML  SOLN COMPARISON:  02/24/2016 FINDINGS: Lower chest: Mild patchy interstitial accentuation in the lung bases. 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 Hepatobiliary: Mildly contracted gallbladder. Pancreas: Unremarkable aside from the pancreatic body extending up into the chest as part of a hiatal hernia. Spleen: Unremarkable Adrenals/Urinary Tract: Unremarkable Stomach/Bowel: Large ventral hernia with rectus diastasis. Ventral hernia contains stomach body and antrum, cecum, ascending colon, and transverse colon; and multiple loops of small bowel including the terminal ileum. Along the right side of the hernia, there is an abnormal localized fluid collection measuring approximately 6.1 by 6.9 by 6.4 cm, with gas density along its upper margin tracking out towards the skin. The appearance  is suspicious for extraluminal fluid and possibly abscess. The possibility of acute appendicitis with periappendiceal abscess and drainage to the cutaneous surface is a distinct possibility. Postoperative findings in the proximal stomach. Vascular/Lymphatic: Aortoiliac atherosclerotic vascular disease. Reproductive: Unremarkable Other: No supplemental non-categorized findings. Musculoskeletal: 7 mm degenerative anterolisthesis at L4-5 with multilevel spondylosis and degenerative disc disease. Prominent central narrowing of the thecal sac at L4-5. Bilateral direct inguinal hernias contain adipose tissue. IMPRESSION: 1. Abnormal inflammatory process in the right side of the large ventral hernia sac with abnormal fluid and small amounts of gas which are probably extraluminal compatible with abscess, and extending out to the cutaneous surface along the lateral margin of the hernia. Contained localized perforated appendicitis is a distinct possibility, as this was the location previously occupied by the appendix on the 11/24/2015 CT scan. The large ventral hernia sac contains part of the stomach, the distal ileum, the cecum and ascending colon, and the transverse colon. 2. 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 3. Bilateral direct inguinal hernias contain adipose tissue. 4. Other imaging findings of potential clinical significance: Mild interstitial accentuation in the lung bases, chronic. Postoperative stone findings in the proximal stomach. Aortic Atherosclerosis (ICD10-I70.0). Prominent central narrowing of the thecal sac at L4-5 due to degenerative anterolisthesis and spurring. These results were called by telephone at the time of interpretation on 12/03/2017 at 8:53 pm to Dr. Shaune Pollack , who verbally acknowledged these results. Electronically Signed   By: Gaylyn Rong M.D.   On: 12/03/2017 20:53     Assessment/Plan Principal Problem:   Abdominal visceral abscess (HCC) Active  Problems:   Ruptured appendicitis   Restless leg syndrome   Normocytic normochromic anemia   Asthma   HLD (hyperlipidemia)   Abdominal wall abscess    1. Abdominal stenosis with enterocutaneous fistula -appreciate general surgery consult patient kept n.p.o. except medications and on Zosyn.  Wound has been dressed.  Further recommendations per surgery. 2. History of asthma presently not wheezing continue inhalers. 3. History of restless leg syndrome on ropinirole and also takes gabapentin and Klonopin. 4. History of depression we will continue home medications. 5. Hyperlipidemia on statins. 6. Normocytic normochromic anemia -follow CBC.  Check anemia panel. 7. Chronic kidney disease stage III follow metabolic panel.   DVT prophylaxis: SCDs. Code Status: Full code. Family Communication: Discussed with patient. Disposition Plan: Home. Consults called: General surgery. Admission status: Inpatient.   Eduard Clos MD [facility] Pager [phone].  If 7PM-7AM, please contact night-coverage [email or web address] Password [email or web address]  12/04/2017, 6:10 AM

## 2017-12-05 ENCOUNTER — Inpatient Hospital Stay (HOSPITAL_COMMUNITY): Payer: Medicare Other

## 2017-12-05 LAB — BASIC METABOLIC PANEL
ANION GAP: 10 (ref 5–15)
BUN: 14 mg/dL (ref 8–23)
CALCIUM: 9.6 mg/dL (ref 8.9–10.3)
CO2: 29 mmol/L (ref 22–32)
CREATININE: 1.16 mg/dL (ref 0.61–1.24)
Chloride: 103 mmol/L (ref 98–111)
GFR, EST NON AFRICAN AMERICAN: 60 mL/min — AB (ref >60)
GLUCOSE: 107 mg/dL — AB (ref 70–99)
Potassium: 4.6 mmol/L (ref 3.5–5.1)
Sodium: 142 mmol/L (ref 135–145)

## 2017-12-05 LAB — CBC
HCT: 33.7 % — ABNORMAL LOW (ref 39.0–52.0)
Hemoglobin: 10.9 g/dL — ABNORMAL LOW (ref 13.0–17.0)
MCH: 31.1 pg (ref 26.0–34.0)
MCHC: 32.3 g/dL (ref 30.0–36.0)
MCV: 96 fL (ref 78.0–100.0)
PLATELETS: 370 10*3/uL (ref 150–400)
RBC: 3.51 MIL/uL — ABNORMAL LOW (ref 4.22–5.81)
RDW: 13.2 % (ref 11.5–15.5)
WBC: 6.9 10*3/uL (ref 4.0–10.5)

## 2017-12-05 MED ORDER — FENTANYL CITRATE (PF) 100 MCG/2ML IJ SOLN
INTRAMUSCULAR | Status: AC
  Filled 2017-12-05: qty 2

## 2017-12-05 MED ORDER — FENTANYL CITRATE (PF) 100 MCG/2ML IJ SOLN
INTRAMUSCULAR | Status: AC | PRN
  Administered 2017-12-05: 25 ug via INTRAVENOUS
  Administered 2017-12-05: 50 ug via INTRAVENOUS

## 2017-12-05 MED ORDER — IPRATROPIUM-ALBUTEROL 0.5-2.5 (3) MG/3ML IN SOLN
3.0000 mL | Freq: Three times a day (TID) | RESPIRATORY_TRACT | Status: DC
  Administered 2017-12-05 – 2017-12-06 (×2): 3 mL via RESPIRATORY_TRACT
  Filled 2017-12-05: qty 3

## 2017-12-05 MED ORDER — LIDOCAINE HCL 1 % IJ SOLN
INTRAMUSCULAR | Status: AC
  Administered 2017-12-05: 12:00:00
  Filled 2017-12-05: qty 20

## 2017-12-05 MED ORDER — MIDAZOLAM HCL 2 MG/2ML IJ SOLN
INTRAMUSCULAR | Status: AC | PRN
  Administered 2017-12-05: 0.5 mg via INTRAVENOUS
  Administered 2017-12-05: 1 mg via INTRAVENOUS
  Administered 2017-12-05: 0.5 mg via INTRAVENOUS

## 2017-12-05 MED ORDER — MIDAZOLAM HCL 2 MG/2ML IJ SOLN
INTRAMUSCULAR | Status: AC
  Filled 2017-12-05: qty 2

## 2017-12-05 NOTE — Procedures (Addendum)
Interventional Radiology Procedure Note  Procedure: US guided aspiration of right abdominal wall infection  Complications: None  Estimated Blood Loss: < 10 mL  Findings: US guided aspiration performed in multiple areas of the right abdominal wall process near/at area of wound with 5 Fr Yueh catheter and 18 G spinal needle.  No significant liquified fluid.  Only few drops of old appearing blood able to be aspirated.  Combined with 2 mL sterile saline and sent for culture.  Jodi Marble. Fredia Sorrow, M.D Pager:  [phone]

## 2017-12-05 NOTE — Consult Note (Signed)
WOC Nurse wound follow up I have reviewed the note from the IR MD from this morning at 9:54.  According to the entry, only a few drops of old appearing blood was aspirated.  This morning, prior to this procedure, I visited with the patient and the foam dressing had only drops of drainage on it.  I discussed the request to pouch the wound with Dr. Alvino Chapel via telephone.  She is in agreement with the following plan: 1. Do not pouch the wound.  Supporting rationale for this is that currently the wound has very little drainage AND the patient cannot see the wound.  This makes it highly unlikely that the patient would be successful in continuing this type of approach to the area upon discharge. 2.  Continue the use of a foam dressing to the site.  Supporting rationale includes the absorption capability of the dressing AND it is an approach the patient would be successful in continuing upon discharge.  I have placed a call to speak with Dr. Cliffton Asters for addtional future treatment options.   Helmut Muster, RN, MSN, CWOCN, CNS-BC, pager [phone]

## 2017-12-05 NOTE — Consult Note (Signed)
WOC Nurse wound follow up I have completed the phone conversation with Dr. Cliffton Asters about topical therapy options for the patient's abdominal wound. Because of the complexity of the patient's situation in the abdomen, there is an overriding need to avoid aggressive debridement of the necrotic tissue by sharp, autolytic, or enzymatic methods.  Dr. Cliffton Asters agrees with the plan to continue the use of a foam dressing to the wound to absorb any drainage that may develop and to promote self care by the patient upon discharge.  Helmut Muster, RN, MSN, CWOCN, CNS-BC, pager [phone]

## 2017-12-05 NOTE — Progress Notes (Signed)
Subjective No acute events. Reports feeling well this morning. Abdominal pain he had has improved. He denies n/v. Moving bowels  Objective: Vital signs in last 24 hours: Temp:  [97.7 F (36.5 C)-98.7 F (37.1 C)] 97.7 F (36.5 C) (07/18 0624) Pulse Rate:  [77-97] 96 (07/18 0624) Resp:  [16-18] 16 (07/18 0624) BP: (126-152)/(70-89) 152/70 (07/18 0624) SpO2:  [96 %-97 %] 97 % (07/18 0624) Last BM Date: 12/03/17  Intake/Output from previous day: 07/17 0701 - 07/18 0700 In: 919.1 [P.O.:30; I.V.:738.5; IV Piggyback:150.6] Out: -  Intake/Output this shift: No intake/output data recorded.  Gen: NAD, comfortable CV: RRR Pulm: Normal work of breathing Abd: Soft, obese, minimally ttp along right abdominal wall where wound is. No tenderness elsewhere. Large ventral hernia, nontender. Not significantly distended. Scant rainage from right abdominal wall - ~3x3cm eschar with surrounding blanching erythema that is regressing Ext: No pitting edema  Lab Results: CBC  Recent Labs    12/04/17 0620 12/05/17 0616  WBC 15.1* 6.9  HGB 11.3* 10.9*  HCT 34.1* 33.7*  PLT 347 370   BMET Recent Labs    12/04/17 0620 12/05/17 0616  NA 141 142  K 5.0 4.6  CL 104 103  CO2 25 29  GLUCOSE 94 107*  BUN 19 14  CREATININE 1.21 1.16  CALCIUM 9.3 9.6   PT/INR No results for input(s): LABPROT, INR in the last 72 hours. ABG No results for input(s): PHART, HCO3 in the last 72 hours.  Invalid input(s): PCO2, PO2  Studies/Results:  Anti-infectives: Anti-infectives (From admission, onward)   Start     Dose/Rate Route Frequency Ordered Stop   12/04/17 1800  vancomycin (VANCOCIN) IVPB 1000 mg/200 mL premix     1,000 mg 200 mL/hr over 60 Minutes Intravenous Every 24 hours 12/04/17 1306     12/04/17 0400  piperacillin-tazobactam (ZOSYN) IVPB 3.375 g     3.375 g 12.5 mL/hr over 240 Minutes Intravenous Every 8 hours 12/03/17 2244     12/03/17 1930  vancomycin (VANCOCIN) 2,000 mg in sodium  chloride 0.9 % 500 mL IVPB     2,000 mg 250 mL/hr over 120 Minutes Intravenous  Once 12/03/17 1855 12/03/17 2148   12/03/17 1900  piperacillin-tazobactam (ZOSYN) IVPB 3.375 g     3.375 g 100 mL/hr over 30 Minutes Intravenous  Once 12/03/17 1845 12/03/17 2018       Assessment/Plan: Patient Active Problem List   Diagnosis Date Noted  . Ruptured appendicitis 12/04/2017  . Restless leg syndrome 12/04/2017  . Normocytic normochromic anemia 12/04/2017  . Asthma 12/04/2017  . HLD (hyperlipidemia) 12/04/2017  . Abdominal wall abscess 12/04/2017  . Abdominal visceral abscess (HCC) 12/03/2017   Jack Vance is a 76yoM with new possible ECF vs ruptured appendicitis reaching the skin because of the appendix location in the hernia sac. He has severe loss of domain from the size of the hernia. He has no signs of peritonitis. He wants to stay out of the operating room. Large amount of pus drained from probing at bedside  -Afebrile, WBC down to 7 from 15. Abdominal exam remains benign. -We will continue with plan of care - IR for percutaneous drainage, ideally not through eschar but healthy skin -NPO for procedure with IR, IVF, broad spec  IV abx - Vanc + Zosyn -Wound ostomy nurse consultation placed for pouching to aid in wound care and skin protection. Appliance ideally cut large enough so as not to cover eschar. -Will continue to follow with you   LOS:  2 days   Stephanie Coup. Cliffton Asters, M.D. [facility]

## 2017-12-05 NOTE — Progress Notes (Addendum)
PROGRESS NOTE    Jack Vance  IWO:[id number] DOB: [birth year] DOA: 12/03/2017 PCP: Patient, No Pcp Per     Brief Narrative:  Jack Vance a 77 y.o.malewithhistory of asthma, hyperlipidemia, restless leg syndrome, chronic kidney disease stage III and ventral hernia who started noticing swelling around his abdomen in the right lower quadrant around a week ago which slowly gave way and started having discharge purulent with some blood colored. This has been progressively worsening and patient decided to come to the ER. 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 General surgery was consulted.   New events last 24 hours / Subjective: Patient reports feeling much better, wants to go home tomorrow morning. Denies any new complaints.   Assessment & Plan:   Principal Problem:   Abdominal visceral abscess (HCC) Active Problems:   Ruptured appendicitis   Restless leg syndrome   Normocytic normochromic anemia   Asthma   HLD (hyperlipidemia)   Abdominal wall abscess   Enterocutaneous fistula -General surgery following -IR for perc drainage today -Vanco/zosyn  -WBC improving, 6.9 today   Hyperlipidemia -Continue lipitor  Depression/Anxiety -Continue klonopin, prozac   BPH -Continue proscar, flomax    DVT prophylaxis: SCD Code Status: Full Family Communication: No family at bedside Disposition Plan: Pending IR drainage, culture results, clinical improvement   Consultants:   General surgery  IR   Procedures:   None   Antimicrobials:  Anti-infectives (From admission, onward)   Start     Dose/Rate Route Frequency Ordered Stop   12/04/17 1800  vancomycin (VANCOCIN) IVPB 1000 mg/200 mL premix     1,000 mg 200 mL/hr over 60 Minutes Intravenous Every 24 hours 12/04/17 1306     12/04/17 0400  piperacillin-tazobactam (ZOSYN) IVPB 3.375 g     3.375 g 12.5 mL/hr over 240 Minutes Intravenous Every 8 hours 12/03/17 2244     12/03/17 1930   vancomycin (VANCOCIN) 2,000 mg in sodium chloride 0.9 % 500 mL IVPB     2,000 mg 250 mL/hr over 120 Minutes Intravenous  Once 12/03/17 1855 12/03/17 2148   12/03/17 1900  piperacillin-tazobactam (ZOSYN) IVPB 3.375 g     3.375 g 100 mL/hr over 30 Minutes Intravenous  Once 12/03/17 1845 12/03/17 2018        Objective: Vitals:   12/04/17 1216 12/04/17 1320 12/04/17 1937 12/05/17 0624  BP:  139/76 126/78 (!) 152/70  Pulse:  84 77 96  Resp:  17 16 16   Temp:  98.7 F (37.1 C) 98.1 F (36.7 C) 97.7 F (36.5 C)  TempSrc:  Oral Oral Oral  SpO2: 96% 96% 96% 97%  Weight:      Height:        Intake/Output Summary (Last 24 hours) at 12/05/2017 0908 Last data filed at 12/05/2017 0700 Gross per 24 hour  Intake 919.13 ml  Output -  Net 919.13 ml   Filed Weights   12/03/17 1110  Weight: 88.5 kg (195 lb)    Examination:  General exam: Appears calm and comfortable  Respiratory system: Clear to auscultation. Respiratory effort normal. Cardiovascular system: S1 & S2 heard, RRR. No JVD, murmurs, rubs, gallops or clicks. No pedal edema. Gastrointestinal system: Abdomen is nondistended, soft and nontender. +large ventral hernia present. Normal bowel sounds heard. Central nervous system: Alert and oriented. No focal neurological deficits. Extremities: Symmetric 5 x 5 power. Skin: Dressing over ECF dry and clean  Psychiatry: Judgement and insight appear normal. Mood & affect appropriate.   Data Reviewed: I  have personally reviewed following labs and imaging studies  CBC: Recent Labs  Lab 12/03/17 1914 12/04/17 0620 12/05/17 0616  WBC 14.1* 15.1* 6.9  NEUTROABS 10.8* 12.3*  --   HGB 10.1* 11.3* 10.9*  HCT 31.2* 34.1* 33.7*  MCV 96.0 93.4 96.0  PLT 316 347 370   Basic Metabolic Panel: Recent Labs  Lab 12/03/17 1914 12/04/17 0620 12/05/17 0616  NA 138 141 142  K 4.0 5.0 4.6  CL 102 104 103  CO2 26 25 29   GLUCOSE 144* 94 107*  BUN 27* 19 14  CREATININE 1.56* 1.21 1.16    CALCIUM 9.1 9.3 9.6   GFR: Estimated Creatinine Clearance: 57.4 mL/min (by C-G formula based on SCr of 1.16 mg/dL). Liver Function Tests: Recent Labs  Lab 12/03/17 1914 12/04/17 0620  AST 21 18  ALT 34 32  ALKPHOS 71 73  BILITOT 0.1* 0.3  PROT 7.0 7.2  ALBUMIN 3.0* 2.9*   No results for input(s): LIPASE, AMYLASE in the last 168 hours. No results for input(s): AMMONIA in the last 168 hours. Coagulation Profile: No results for input(s): INR, PROTIME in the last 168 hours. Cardiac Enzymes: No results for input(s): CKTOTAL, CKMB, CKMBINDEX, TROPONINI in the last 168 hours. BNP (last 3 results) No results for input(s): PROBNP in the last 8760 hours. HbA1C: No results for input(s): HGBA1C in the last 72 hours. CBG: No results for input(s): GLUCAP in the last 168 hours. Lipid Profile: No results for input(s): CHOL, HDL, LDLCALC, TRIG, CHOLHDL, LDLDIRECT in the last 72 hours. Thyroid Function Tests: No results for input(s): TSH, T4TOTAL, FREET4, T3FREE, THYROIDAB in the last 72 hours. Anemia Panel: Recent Labs    12/04/17 0620 12/04/17 0738  VITAMINB12  --  735  FOLATE  --  31.3  FERRITIN  --  136  TIBC  --  252  IRON  --  28*  RETICCTPCT 0.7  --    Sepsis Labs: Recent Labs  Lab 12/03/17 1920  LATICACIDVEN 1.42    No results found for this or any previous visit (from the past 240 hour(s)).     Radiology Studies: Ct Abdomen Pelvis W Contrast  Result Date: 12/03/2017 CLINICAL DATA:  Expanding focal erythematous lesion on the right abdomen with drainage, erythema, and increasing pain. Nausea and vomiting. EXAM: CT ABDOMEN AND PELVIS WITH CONTRAST TECHNIQUE: Multidetector CT imaging of the abdomen and pelvis was performed using the standard protocol following bolus administration of intravenous contrast. CONTRAST:  75mL OMNIPAQUE IOHEXOL 300 MG/ML  SOLN COMPARISON:  02/24/2016 FINDINGS: Lower chest: Mild patchy interstitial accentuation in the lung bases. A hiatal  hernia contains the pancreatic body but not the stomach. Hepatobiliary: Mildly contracted gallbladder. Pancreas: Unremarkable aside from the pancreatic body extending up into the chest as part of a hiatal hernia. Spleen: Unremarkable Adrenals/Urinary Tract: Unremarkable Stomach/Bowel: Large ventral hernia with rectus diastasis. Ventral hernia contains stomach body and antrum, cecum, ascending colon, and transverse colon; and multiple loops of small bowel including the terminal ileum. Along the right side of the hernia, there is an abnormal localized fluid collection measuring approximately 6.1 by 6.9 by 6.4 cm, with gas density along its upper margin tracking out towards the skin. The appearance is suspicious for extraluminal fluid and possibly abscess. The possibility of acute appendicitis with periappendiceal abscess and drainage to the cutaneous surface is a distinct possibility. Postoperative findings in the proximal stomach. Vascular/Lymphatic: Aortoiliac atherosclerotic vascular disease. Reproductive: Unremarkable Other: No supplemental non-categorized findings. Musculoskeletal: 7 mm degenerative anterolisthesis at  L4-5 with multilevel spondylosis and degenerative disc disease. Prominent central narrowing of the thecal sac at L4-5. Bilateral direct inguinal hernias contain adipose tissue. IMPRESSION: 1. Abnormal inflammatory process in the right side of the large ventral hernia sac with abnormal fluid and small amounts of gas which are probably extraluminal compatible with abscess, and extending out to the cutaneous surface along the lateral margin of the hernia. Contained localized perforated appendicitis is a distinct possibility, as this was the location previously occupied by the appendix on the 11/24/2015 CT scan. The large ventral hernia sac contains part of the stomach, the distal ileum, the cecum and ascending colon, and the transverse colon. 2. 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 3.  Bilateral direct inguinal hernias contain adipose tissue. 4. Other imaging findings of potential clinical significance: Mild interstitial accentuation in the lung bases, chronic. Postoperative stone findings in the proximal stomach. Aortic Atherosclerosis (ICD10-I70.0). Prominent central narrowing of the thecal sac at L4-5 due to degenerative anterolisthesis and spurring. These results were called by telephone at the time of interpretation on 12/03/2017 at 8:53 pm to Dr. Shaune Pollack , who verbally acknowledged these results. Electronically Signed   By: Gaylyn Rong M.D.   On: 12/03/2017 20:53      Scheduled Meds: . lidocaine      . atorvastatin  80 mg Oral Daily  . clonazePAM  1 mg Oral BID  . finasteride  5 mg Oral Daily  . FLUoxetine  20 mg Oral Daily  . fluticasone  1 spray Each Nare Daily  . gabapentin  400 mg Oral BID  . gabapentin  800 mg Oral QHS  . lidocaine  1 patch Transdermal Q24H  . mometasone-formoterol  2 puff Inhalation BID  . rOPINIRole  4 mg Oral TID  . tamsulosin  0.4 mg Oral Daily   Continuous Infusions: . sodium chloride Stopped (12/05/17 0822)  . piperacillin-tazobactam (ZOSYN)  IV Stopped (12/05/17 0841)  . vancomycin Stopped (12/04/17 1932)     LOS: 2 days    Time spent: 25 minutes   Noralee Stain, DO [facility] [email or web address] Password [email or web address] 12/05/2017, 9:08 AM

## 2017-12-06 LAB — BASIC METABOLIC PANEL
ANION GAP: 11 (ref 5–15)
BUN: 12 mg/dL (ref 8–23)
CO2: 27 mmol/L (ref 22–32)
CREATININE: 1.1 mg/dL (ref 0.61–1.24)
Calcium: 9.3 mg/dL (ref 8.9–10.3)
Chloride: 102 mmol/L (ref 98–111)
GFR calc Af Amer: >60 mL/min (ref >60)
GLUCOSE: 111 mg/dL — AB (ref 70–99)
Potassium: 4.2 mmol/L (ref 3.5–5.1)
Sodium: 140 mmol/L (ref 135–145)

## 2017-12-06 LAB — CBC
HCT: 32.7 % — ABNORMAL LOW (ref 39.0–52.0)
Hemoglobin: 10.6 g/dL — ABNORMAL LOW (ref 13.0–17.0)
MCH: 30.8 pg (ref 26.0–34.0)
MCHC: 32.4 g/dL (ref 30.0–36.0)
MCV: 95.1 fL (ref 78.0–100.0)
Platelets: 373 10*3/uL (ref 150–400)
RBC: 3.44 MIL/uL — ABNORMAL LOW (ref 4.22–5.81)
RDW: 13 % (ref 11.5–15.5)
WBC: 7.4 10*3/uL (ref 4.0–10.5)

## 2017-12-06 MED ORDER — IPRATROPIUM-ALBUTEROL 0.5-2.5 (3) MG/3ML IN SOLN
3.0000 mL | Freq: Two times a day (BID) | RESPIRATORY_TRACT | Status: DC
  Administered 2017-12-06 – 2017-12-09 (×6): 3 mL via RESPIRATORY_TRACT
  Filled 2017-12-06 (×6): qty 3

## 2017-12-06 NOTE — Progress Notes (Signed)
[facility] Progress Note     Subjective: CC: abdominal wall abscess Patient reports some abdominal pain but better than previously. Tolerating CLD, passing flatus and having bowel function. Really wants to go home.   Objective: Vital signs in last 24 hours: Temp:  [97.6 F (36.4 C)-98.4 F (36.9 C)] 98.4 F (36.9 C) (07/19 7829) Pulse Rate:  [77-93] 79 (07/19 0613) Resp:  [14-20] 18 (07/19 0613) BP: (108-146)/(63-84) 129/84 (07/19 0613) SpO2:  [92 %-100 %] 92 % (07/19 0820) Weight:  [183 lb 10.3 oz (83.3 kg)] 183 lb 10.3 oz (83.3 kg) (07/19 0406) Last BM Date: 12/04/17  Intake/Output from previous day: 07/18 0701 - 07/19 0700 In: 1513.5 [P.O.:940; I.V.:223.7; IV Piggyback:349.8] Out: -  Intake/Output this shift: No intake/output data recorded.  PE: Gen:  Alert, NAD, pleasant Card:  Regular rate and rhythm, pedal pulses 2+ BL Pulm:  Normal effort, clear to auscultation bilaterally Abd: Soft, non-tender, non-distended, bowel sounds present, very large ventral hernia; mildly TTP along R abdominal wall wound, bloody drainage from 3x3 cm eschar with surrounding erythema which appears slightly improved compared to photo from yesterday Psych: A&Ox3   Lab Results:  Recent Labs    12/05/17 0616 12/06/17 0608  WBC 6.9 7.4  HGB 10.9* 10.6*  HCT 33.7* 32.7*  PLT 370 373   BMET Recent Labs    12/05/17 0616 12/06/17 0608  NA 142 140  K 4.6 4.2  CL 103 102  CO2 29 27  GLUCOSE 107* 111*  BUN 14 12  CREATININE 1.16 1.10  CALCIUM 9.6 9.3   PT/INR No results for input(s): LABPROT, INR in the last 72 hours. CMP     Component Value Date/Time   NA 140 12/06/2017 0608   K 4.2 12/06/2017 0608   CL 102 12/06/2017 0608   CO2 27 12/06/2017 0608   GLUCOSE 111 (H) 12/06/2017 0608   BUN 12 12/06/2017 0608   CREATININE 1.10 12/06/2017 0608   CALCIUM 9.3 12/06/2017 0608   PROT 7.2 12/04/2017 0620   ALBUMIN 2.9 (L) 12/04/2017 0620   AST 18 12/04/2017 0620   ALT  32 12/04/2017 0620   ALKPHOS 73 12/04/2017 0620   BILITOT 0.3 12/04/2017 0620   GFRNONAA >60 12/06/2017 0608   GFRAA >60 12/06/2017 0608   Lipase  No results found for: LIPASE     Studies/Results: [place] Abscess Drain  Result Date: 12/05/2017 INDICATION: Draining necrotic wound of the right lateral abdominal wall with CT demonstrating more focal component fluid in the superficial soft tissues. EXAM: ULTRASOUND-GUIDED ASPIRATION OF ABDOMINAL WALL FLUID COLLECTION MEDICATIONS: The patient is currently admitted to the hospital and receiving intravenous antibiotics. The antibiotics were administered within an appropriate time frame prior to the initiation of the procedure. ANESTHESIA/SEDATION: Fentanyl 75 mcg IV; Versed 2.0 mg IV Moderate Sedation Time:  16 minutes. The patient was continuously monitored during the procedure by the interventional radiology nurse under my direct supervision. COMPLICATIONS: None immediate. PROCEDURE: Informed written consent was obtained from the patient after a thorough discussion of the procedural risks, benefits and alternatives. All questions were addressed. Maximal Sterile Barrier Technique was utilized including caps, mask, sterile gowns, sterile gloves, sterile drape, hand hygiene and skin antiseptic. A timeout was performed prior to the initiation of the procedure. Ultrasound was performed at the level of the right lower abdominal wall. Under ultrasound guidance, a 5 [place] Yueh centesis catheter was advanced over a 19 gauge needle into several areas of soft tissue and complex fluid in the  right abdominal wall subcutaneous tissues. An 18 gauge spinal needle was also utilized in performing aspiration in several areas of the right abdominal wall. An aspirate sample was combined with 2 mL of sterile saline and sent for culture analysis. FINDINGS: Ultrasound shows a complex area inflammation and loculated complex fluid in the subcutaneous soft tissues of the right abdominal  wall. Aspiration in multiple areas did not yield any free fluid. Only a few drops of dark bloody fluid were able to be aspirated which was combined with sterile saline and sent for culture analysis. IMPRESSION: Complex inflammatory process in the right abdominal wall with no significant component of free fluid. No purulent fluid or free fluid was able to be aspirated. A few drops of dark bloody fluid were aspirated and combined with sterile saline. The sample was sent for culture analysis. Electronically Signed   By: Irish Lack M.D.   On: 12/05/2017 13:13    Anti-infectives: Anti-infectives (From admission, onward)   Start     Dose/Rate Route Frequency Ordered Stop   12/04/17 1800  vancomycin (VANCOCIN) IVPB 1000 mg/200 mL premix     1,000 mg 200 mL/hr over 60 Minutes Intravenous Every 24 hours 12/04/17 1306     12/04/17 0400  piperacillin-tazobactam (ZOSYN) IVPB 3.375 g     3.375 g 12.5 mL/hr over 240 Minutes Intravenous Every 8 hours 12/03/17 2244     12/03/17 1930  vancomycin (VANCOCIN) 2,000 mg in sodium chloride 0.9 % 500 mL IVPB     2,000 mg 250 mL/hr over 120 Minutes Intravenous  Once 12/03/17 1855 12/03/17 2148   12/03/17 1900  piperacillin-tazobactam (ZOSYN) IVPB 3.375 g     3.375 g 100 mL/hr over 30 Minutes Intravenous  Once 12/03/17 1845 12/03/17 2018       Assessment/Plan Restless leg syndrome Normocytic normochromic anemia - H/H 10.6/32.7, VSS Asthma HLD  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

## 2017-12-06 NOTE — Progress Notes (Signed)
Pharmacy Antibiotic Note  Jack Vance is a 77 y.o. 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  Pharmacy has been consulted for vancomycin and zosyn dosing.  Vancomycin 2 gm given 7/16 at 1948.  Zosyn started 7/16 at 1948. WBC 15.1, SCr 1.56>1.21.  IR  Consulted for possible drain of abscess.  Patient now with GPC growing in abscess culture  Plan: Day 3 Abxs 1) Continue vancomycin 1 gm IV q24 for AUC 476 using SCr 1.21, IBW/ABW - goal AUC 400-500 2) Continue current Zosyn dosing 3) Daily SCr 4) Follow up abscess culture and narrow abx's appropriately   Height: 5\' 6"  (167.6 cm) Weight: 183 lb 10.3 oz (83.3 kg) IBW/kg (Calculated) : 63.8  Temp (24hrs), Avg:98 F (36.7 C), Min:97.6 F (36.4 C), Max:98.4 F (36.9 C)  Recent Labs  Lab 12/03/17 1914 12/03/17 1920 12/04/17 0620 12/05/17 0616 12/06/17 0608  WBC 14.1*  --  15.1* 6.9 7.4  CREATININE 1.56*  --  1.21 1.16 1.10  LATICACIDVEN  --  1.42  --   --   --     Estimated Creatinine Clearance: 58.8 mL/min (by C-G formula based on SCr of 1.1 mg/dL).    Allergies  Allergen Reactions  . Cheese     Causes asthma  . Morphine And Related Rash    Antimicrobials this admission: 7/16 vanc>> 7/16 zosyn >>  Dose adjustments this admission:  Microbiology results: 7/18 abd abscess cx >> abundant GPC  Thank you for allowing pharmacy to be a part of this patient's care.  Hessie Knows, PharmD, BCPS Pager [phone] 12/06/2017 11:06 AM

## 2017-12-06 NOTE — Discharge Instructions (Signed)
Take antibiotics as prescribed Change dry dressing daily and as needed for drainage Call with concerns (ie increased redness, nausea, vomiting, fever...)

## 2017-12-06 NOTE — Plan of Care (Signed)
Plan of care discussed.   

## 2017-12-06 NOTE — Care Management Important Message (Signed)
Important Message  Patient Details  Name: Jack Vance MRN: [id number] Date of Birth: 27-Nov-1940   Medicare Important Message Given:  Yes    Caren Macadam 12/06/2017, 11:57 AMImportant Message  Patient Details  Name: Jack Vance MRN: [id number] Date of Birth: [birth year]   Medicare Important Message Given:  Yes    Caren Macadam 12/06/2017, 11:57 AM

## 2017-12-06 NOTE — Progress Notes (Signed)
PROGRESS NOTE    Jack Vance  BJY:[id number] DOB: 1940-11-26 DOA: 12/03/2017 PCP: Patient, No Pcp Per     Brief Narrative:  Jack Vance a 77 y.o.malewithhistory of asthma, hyperlipidemia, restless leg syndrome, chronic kidney disease stage III and ventral hernia who started noticing swelling around his abdomen in the right lower quadrant around a week ago which slowly gave way and started having discharge purulent with some blood colored. This has been progressively worsening and patient decided to come to the ER. 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 General surgery was consulted.   New events last 24 hours / Subjective: No new issues overnight.  Pain well controlled.  Assessment & Plan:   Principal Problem:   Abdominal visceral abscess (HCC) Active Problems:   Ruptured appendicitis   Restless leg syndrome   Normocytic normochromic anemia   Asthma   HLD (hyperlipidemia)   Abdominal wall abscess   Enterocutaneous fistula -General surgery following -IR for perc drainage 7/18 aspirated minimal fluid - culture pending  -Leukocytosis resolved  -Vanco/zosyn  -Continue wound care, apply silicone dressing and change daily -Full liquid diet, advance as tolerated  Hyperlipidemia -Continue lipitor  Depression/Anxiety -Continue klonopin, prozac   BPH -Continue proscar, flomax    DVT prophylaxis: SCD Code Status: Full Family Communication: No family at bedside, left voicemail for sister  Disposition Plan: Pending clinical improvement, hopefully home 7/20 if improved    Consultants:   General surgery  IR   Procedures:   Perc drainage 7/18 aspirated minimal fluid - culture pending   Antimicrobials:  Anti-infectives (From admission, onward)   Start     Dose/Rate Route Frequency Ordered Stop   12/04/17 1800  vancomycin (VANCOCIN) IVPB 1000 mg/200 mL premix     1,000 mg 200 mL/hr over 60 Minutes Intravenous Every 24 hours  12/04/17 1306     12/04/17 0400  piperacillin-tazobactam (ZOSYN) IVPB 3.375 g     3.375 g 12.5 mL/hr over 240 Minutes Intravenous Every 8 hours 12/03/17 2244     12/03/17 1930  vancomycin (VANCOCIN) 2,000 mg in sodium chloride 0.9 % 500 mL IVPB     2,000 mg 250 mL/hr over 120 Minutes Intravenous  Once 12/03/17 1855 12/03/17 2148   12/03/17 1900  piperacillin-tazobactam (ZOSYN) IVPB 3.375 g     3.375 g 100 mL/hr over 30 Minutes Intravenous  Once 12/03/17 1845 12/03/17 2018       Objective: Vitals:   12/05/17 2216 12/06/17 0406 12/06/17 0613 12/06/17 0820  BP: 121/69  129/84   Pulse: 81  79   Resp: 18  18   Temp: 97.9 F (36.6 C)  98.4 F (36.9 C)   TempSrc: Oral  Oral   SpO2: 96%  96% 92%  Weight:  83.3 kg (183 lb 10.3 oz)    Height:        Intake/Output Summary (Last 24 hours) at 12/06/2017 1221 Last data filed at 12/06/2017 1000 Gross per 24 hour  Intake 1524.18 ml  Output -  Net 1524.18 ml   Filed Weights   12/03/17 1110 12/06/17 0406  Weight: 88.5 kg (195 lb) 83.3 kg (183 lb 10.3 oz)    Examination: General exam: Appears calm and comfortable  Respiratory system: Clear to auscultation. Respiratory effort normal. Cardiovascular system: S1 & S2 heard, RRR. No JVD, murmurs, rubs, gallops or clicks. No pedal edema. Gastrointestinal system: Abdomen is nondistended, soft and nontender. No organomegaly or masses felt. Normal bowel sounds heard. +ventral hernia  Central  nervous system: Alert and oriented. No focal neurological deficits. Extremities: Symmetric 5 x 5 power. Skin: +wound right abdomen is covered with silicone dressing  Psychiatry: Judgement and insight appear normal. Mood & affect appropriate.    Data Reviewed: I have personally reviewed following labs and imaging studies  CBC: Recent Labs  Lab 12/03/17 1914 12/04/17 0620 12/05/17 0616 12/06/17 0608  WBC 14.1* 15.1* 6.9 7.4  NEUTROABS 10.8* 12.3*  --   --   HGB 10.1* 11.3* 10.9* 10.6*  HCT 31.2*  34.1* 33.7* 32.7*  MCV 96.0 93.4 96.0 95.1  PLT 316 347 370 373   Basic Metabolic Panel: Recent Labs  Lab 12/03/17 1914 12/04/17 0620 12/05/17 0616 12/06/17 0608  NA 138 141 142 140  K 4.0 5.0 4.6 4.2  CL 102 104 103 102  CO2 26 25 29 27   GLUCOSE 144* 94 107* 111*  BUN 27* 19 14 12   CREATININE 1.56* 1.21 1.16 1.10  CALCIUM 9.1 9.3 9.6 9.3   GFR: Estimated Creatinine Clearance: 58.8 mL/min (by C-G formula based on SCr of 1.1 mg/dL). Liver Function Tests: Recent Labs  Lab 12/03/17 1914 12/04/17 0620  AST 21 18  ALT 34 32  ALKPHOS 71 73  BILITOT 0.1* 0.3  PROT 7.0 7.2  ALBUMIN 3.0* 2.9*   No results for input(s): LIPASE, AMYLASE in the last 168 hours. No results for input(s): AMMONIA in the last 168 hours. Coagulation Profile: No results for input(s): INR, PROTIME in the last 168 hours. Cardiac Enzymes: No results for input(s): CKTOTAL, CKMB, CKMBINDEX, TROPONINI in the last 168 hours. BNP (last 3 results) No results for input(s): PROBNP in the last 8760 hours. HbA1C: No results for input(s): HGBA1C in the last 72 hours. CBG: No results for input(s): GLUCAP in the last 168 hours. Lipid Profile: No results for input(s): CHOL, HDL, LDLCALC, TRIG, CHOLHDL, LDLDIRECT in the last 72 hours. Thyroid Function Tests: No results for input(s): TSH, T4TOTAL, FREET4, T3FREE, THYROIDAB in the last 72 hours. Anemia Panel: Recent Labs    12/04/17 0620 12/04/17 0738  VITAMINB12  --  735  FOLATE  --  31.3  FERRITIN  --  136  TIBC  --  252  IRON  --  28*  RETICCTPCT 0.7  --    Sepsis Labs: Recent Labs  Lab 12/03/17 1920  LATICACIDVEN 1.42    Recent Results (from the past 240 hour(s))  Aerobic/Anaerobic Culture (surgical/deep wound)     Status: None (Preliminary result)   Collection Time: 12/05/17  9:59 AM  Result Value Ref Range Status   Specimen Description   Final    ABSCESS ABDOMEN Performed at [facility], [address] [place],  [place] [address]    Special Requests   Final    Normal Performed at [facility], [address], [place], [place] [address]    Gram Stain   Final    FEW WBC PRESENT, PREDOMINANTLY PMN ABUNDANT GRAM POSITIVE COCCI Performed at [facility], [address], [place], [place] [address]    Culture PENDING  Incomplete   Report Status PENDING  Incomplete       Radiology Studies: [place] Abscess Drain  Result Date: 12/05/2017 INDICATION: Draining necrotic wound of the right lateral abdominal wall with CT demonstrating more focal component fluid in the superficial soft tissues. EXAM: ULTRASOUND-GUIDED ASPIRATION OF ABDOMINAL WALL FLUID COLLECTION MEDICATIONS: The patient is currently admitted to the hospital and receiving intravenous antibiotics. The antibiotics were administered within  an appropriate time frame prior to the initiation of the procedure. ANESTHESIA/SEDATION: Fentanyl 75 mcg IV; Versed 2.0 mg IV Moderate Sedation Time:  16 minutes. The patient was continuously monitored during the procedure by the interventional radiology nurse under my direct supervision. COMPLICATIONS: None immediate. PROCEDURE: Informed written consent was obtained from the patient after a thorough discussion of the procedural risks, benefits and alternatives. All questions were addressed. Maximal Sterile Barrier Technique was utilized including caps, mask, sterile gowns, sterile gloves, sterile drape, hand hygiene and skin antiseptic. A timeout was performed prior to the initiation of the procedure. Ultrasound was performed at the level of the right lower abdominal wall. Under ultrasound guidance, a 5 [place] Yueh centesis catheter was advanced over a 19 gauge needle into several areas of soft tissue and complex fluid in the right abdominal wall subcutaneous tissues. An 18 gauge spinal needle was also utilized in performing aspiration in several areas of the right abdominal wall. An aspirate sample was  combined with 2 mL of sterile saline and sent for culture analysis. FINDINGS: Ultrasound shows a complex area inflammation and loculated complex fluid in the subcutaneous soft tissues of the right abdominal wall. Aspiration in multiple areas did not yield any free fluid. Only a few drops of dark bloody fluid were able to be aspirated which was combined with sterile saline and sent for culture analysis. IMPRESSION: Complex inflammatory process in the right abdominal wall with no significant component of free fluid. No purulent fluid or free fluid was able to be aspirated. A few drops of dark bloody fluid were aspirated and combined with sterile saline. The sample was sent for culture analysis. Electronically Signed   By: Irish Lack M.D.   On: 12/05/2017 13:13      Scheduled Meds: . atorvastatin  80 mg Oral Daily  . clonazePAM  1 mg Oral BID  . finasteride  5 mg Oral Daily  . FLUoxetine  20 mg Oral Daily  . fluticasone  1 spray Each Nare Daily  . gabapentin  400 mg Oral BID  . gabapentin  800 mg Oral QHS  . ipratropium-albuterol  3 mL Nebulization BID  . lidocaine  1 patch Transdermal Q24H  . mometasone-formoterol  2 puff Inhalation BID  . rOPINIRole  4 mg Oral TID  . tamsulosin  0.4 mg Oral Daily   Continuous Infusions: . sodium chloride 10 mL/hr at 12/05/17 1015  . piperacillin-tazobactam (ZOSYN)  IV Stopped (12/06/17 0801)  . vancomycin Stopped (12/05/17 1740)     LOS: 3 days    Time spent: 25 minutes   Noralee Stain, DO [facility] [email or web address] Password [facility] 12/06/2017, 12:21 PM

## 2017-12-07 LAB — CBC
HEMATOCRIT: 31.2 % — AB (ref 39.0–52.0)
Hemoglobin: 10.1 g/dL — ABNORMAL LOW (ref 13.0–17.0)
MCH: 31.2 pg (ref 26.0–34.0)
MCHC: 32.4 g/dL (ref 30.0–36.0)
MCV: 96.3 fL (ref 78.0–100.0)
Platelets: 359 10*3/uL (ref 150–400)
RBC: 3.24 MIL/uL — AB (ref 4.22–5.81)
RDW: 12.8 % (ref 11.5–15.5)
WBC: 7.2 10*3/uL (ref 4.0–10.5)

## 2017-12-07 LAB — BASIC METABOLIC PANEL
Anion gap: 10 (ref 5–15)
BUN: 15 mg/dL (ref 8–23)
CHLORIDE: 102 mmol/L (ref 98–111)
CO2: 29 mmol/L (ref 22–32)
Calcium: 9.1 mg/dL (ref 8.9–10.3)
Creatinine, Ser: 1.07 mg/dL (ref 0.61–1.24)
GFR calc Af Amer: >60 mL/min (ref >60)
GFR calc non Af Amer: >60 mL/min (ref >60)
Glucose, Bld: 120 mg/dL — ABNORMAL HIGH (ref 70–99)
POTASSIUM: 4.3 mmol/L (ref 3.5–5.1)
Sodium: 141 mmol/L (ref 135–145)

## 2017-12-07 MED ORDER — CEFTRIAXONE SODIUM 2 G IJ SOLR
2.0000 g | INTRAMUSCULAR | Status: DC
  Administered 2017-12-07 – 2017-12-08 (×2): 2 g via INTRAVENOUS
  Filled 2017-12-07 (×2): qty 2
  Filled 2017-12-07: qty 20

## 2017-12-07 MED ORDER — ALUM & MAG HYDROXIDE-SIMETH 200-200-20 MG/5ML PO SUSP
30.0000 mL | Freq: Four times a day (QID) | ORAL | Status: DC | PRN
  Administered 2017-12-07: 30 mL via ORAL
  Filled 2017-12-07: qty 30

## 2017-12-07 MED ORDER — HYDROCODONE-ACETAMINOPHEN 5-325 MG PO TABS
1.0000 | ORAL_TABLET | Freq: Four times a day (QID) | ORAL | Status: DC | PRN
  Administered 2017-12-07: 1 via ORAL
  Administered 2017-12-08: 2 via ORAL
  Administered 2017-12-08: 1 via ORAL
  Administered 2017-12-08 – 2017-12-09 (×3): 2 via ORAL
  Filled 2017-12-07 (×2): qty 1
  Filled 2017-12-07 (×4): qty 2

## 2017-12-07 NOTE — Progress Notes (Signed)
[facility] Office:  [phone] General Surgery Progress Note   LOS: 4 days  POD -     Chief Complaint: Abdominal infection  Assessment and Plan: 1.  Ruptured appendix/abdominal wall abscess  WBC - 7,200 - 12/07/2017  Zosyn/Vancomycin  I debrided wound at bedside.  To start BID dressing changes. He can get in the shower.  2.  Large hernia (He had HH surgery in [facility] around 1998) with loss of domain 3.  Asthma 4.  Hiatal hernia 5.  Bilateral inguinal hernias 6.  Hard of hearing 7.  DVT prophylaxis - not on chemoprophylaxis at this time   Principal Problem:   Abdominal visceral abscess (HCC) Active Problems:   Ruptured appendicitis   Restless leg syndrome   Normocytic normochromic anemia   Asthma   HLD (hyperlipidemia)   Abdominal wall abscess  Subjective:  Doing okay.  No complains.  Hard of hearing.  Objective:   Vitals:   12/06/17 2212 12/07/17 0608  BP: 133/73 132/73  Pulse: 96 79  Resp: 16 16  Temp: (!) 97.3 F (36.3 C) 97.9 F (36.6 C)  SpO2: 96% 93%     Intake/Output from previous day:  07/19 0701 - 07/20 0700 In: 1070 [P.O.:480; I.V.:240; IV Piggyback:350] Out: -   Intake/Output this shift:  No intake/output data recorded.   Physical Exam:   General: Older WM who is alert and oriented.    HEENT: Normal. Pupils equal. .   Lungs: Clear   Abdomen: Obese and large abdominal wall hernia.   Wound: right mid abdomen wound with necrotic tissue.  I debrided the wound to 3 x 6 cm in size.  I evacuated a hematoma under the skin   Right sided abdominal wound prior to debridement    Lab Results:    Recent Labs    12/06/17 0608 12/07/17 0446  WBC 7.4 7.2  HGB 10.6* 10.1*  HCT 32.7* 31.2*  PLT 373 359    BMET   Recent Labs    12/06/17 0608 12/07/17 0446  NA 140 141  K 4.2 4.3  CL 102 102  CO2 27 29  GLUCOSE 111* 120*  BUN 12 15  CREATININE 1.10 1.07  CALCIUM 9.3 9.1    PT/INR  No results for input(s): LABPROT, INR  in the last 72 hours.  ABG  No results for input(s): PHART, HCO3 in the last 72 hours.  Invalid input(s): PCO2, PO2   Studies/Results:  [place] Abscess Drain  Result Date: 12/05/2017 INDICATION: Draining necrotic wound of the right lateral abdominal wall with CT demonstrating more focal component fluid in the superficial soft tissues. EXAM: ULTRASOUND-GUIDED ASPIRATION OF ABDOMINAL WALL FLUID COLLECTION MEDICATIONS: The patient is currently admitted to the hospital and receiving intravenous antibiotics. The antibiotics were administered within an appropriate time frame prior to the initiation of the procedure. ANESTHESIA/SEDATION: Fentanyl 75 mcg IV; Versed 2.0 mg IV Moderate Sedation Time:  16 minutes. The patient was continuously monitored during the procedure by the interventional radiology nurse under my direct supervision. COMPLICATIONS: None immediate. PROCEDURE: Informed written consent was obtained from the patient after a thorough discussion of the procedural risks, benefits and alternatives. All questions were addressed. Maximal Sterile Barrier Technique was utilized including caps, mask, sterile gowns, sterile gloves, sterile drape, hand hygiene and skin antiseptic. A timeout was performed prior to the initiation of the procedure. Ultrasound was performed at the level of the right lower abdominal wall. Under ultrasound guidance, a 5 [place] Yueh centesis catheter was  advanced over a 19 gauge needle into several areas of soft tissue and complex fluid in the right abdominal wall subcutaneous tissues. An 18 gauge spinal needle was also utilized in performing aspiration in several areas of the right abdominal wall. An aspirate sample was combined with 2 mL of sterile saline and sent for culture analysis. FINDINGS: Ultrasound shows a complex area inflammation and loculated complex fluid in the subcutaneous soft tissues of the right abdominal wall. Aspiration in multiple areas did not yield any free fluid.  Only a few drops of dark bloody fluid were able to be aspirated which was combined with sterile saline and sent for culture analysis. IMPRESSION: Complex inflammatory process in the right abdominal wall with no significant component of free fluid. No purulent fluid or free fluid was able to be aspirated. A few drops of dark bloody fluid were aspirated and combined with sterile saline. The sample was sent for culture analysis. Electronically Signed   By: Irish Lack M.D.   On: 12/05/2017 13:13     Anti-infectives:   Anti-infectives (From admission, onward)   Start     Dose/Rate Route Frequency Ordered Stop   12/04/17 1800  vancomycin (VANCOCIN) IVPB 1000 mg/200 mL premix     1,000 mg 200 mL/hr over 60 Minutes Intravenous Every 24 hours 12/04/17 1306     12/04/17 0400  piperacillin-tazobactam (ZOSYN) IVPB 3.375 g     3.375 g 12.5 mL/hr over 240 Minutes Intravenous Every 8 hours 12/03/17 2244     12/03/17 1930  vancomycin (VANCOCIN) 2,000 mg in sodium chloride 0.9 % 500 mL IVPB     2,000 mg 250 mL/hr over 120 Minutes Intravenous  Once 12/03/17 1855 12/03/17 2148   12/03/17 1900  piperacillin-tazobactam (ZOSYN) IVPB 3.375 g     3.375 g 100 mL/hr over 30 Minutes Intravenous  Once 12/03/17 1845 12/03/17 2018      Ovidio Kin, MD, FACS Pager: [phone] [facility] Office: [phone] 12/07/2017

## 2017-12-07 NOTE — Progress Notes (Addendum)
PROGRESS NOTE    Jack Vance  ZOX:[id number] DOB: December 09, 1940 DOA: 12/03/2017 PCP: Patient, No Pcp Per     Brief Narrative:  Jack Vance a 77 y.o.malewithhistory of asthma, hyperlipidemia, restless leg syndrome, chronic kidney disease stage III and ventral hernia who started noticing swelling around his abdomen in the right lower quadrant around a week ago which slowly gave way and started having discharge purulent with some blood colored. This has been progressively worsening and patient decided to come to the ER. 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 General surgery was consulted.   New events last 24 hours / Subjective: General surgery performed bedside debridement of the right abdominal wall today.  He has no other complaints.  Wondering when he can go home.  Assessment & Plan:   Principal Problem:   Abdominal visceral abscess (HCC) Active Problems:   Ruptured appendicitis   Restless leg syndrome   Normocytic normochromic anemia   Asthma   HLD (hyperlipidemia)   Abdominal wall abscess   Enterocutaneous fistula -General surgery following -IR for perc drainage 7/18 aspirated minimal fluid  -Leukocytosis resolved  -Continue wound care, apply silicone dressing and change daily -Status post bedside debridement of the right abdominal wall 7/20 -Wound culture revealed pan-sensitive E Coli. Deescalate to Rocephin 2g daily   Hyperlipidemia -Continue lipitor  Depression/Anxiety -Continue klonopin, prozac   BPH -Continue proscar, flomax    DVT prophylaxis: SCD Code Status: Full Family Communication: No family at bedside Disposition Plan: Pending clinical improvement, hopefully home 7/21 if improved    Consultants:   General surgery  IR   Procedures:   Perc drainage 7/18 aspirated minimal fluid - culture pending   Antimicrobials:  Anti-infectives (From admission, onward)   Start     Dose/Rate Route Frequency Ordered Stop    12/04/17 1800  vancomycin (VANCOCIN) IVPB 1000 mg/200 mL premix     1,000 mg 200 mL/hr over 60 Minutes Intravenous Every 24 hours 12/04/17 1306     12/04/17 0400  piperacillin-tazobactam (ZOSYN) IVPB 3.375 g     3.375 g 12.5 mL/hr over 240 Minutes Intravenous Every 8 hours 12/03/17 2244     12/03/17 1930  vancomycin (VANCOCIN) 2,000 mg in sodium chloride 0.9 % 500 mL IVPB     2,000 mg 250 mL/hr over 120 Minutes Intravenous  Once 12/03/17 1855 12/03/17 2148   12/03/17 1900  piperacillin-tazobactam (ZOSYN) IVPB 3.375 g     3.375 g 100 mL/hr over 30 Minutes Intravenous  Once 12/03/17 1845 12/03/17 2018       Objective: Vitals:   12/06/17 1933 12/06/17 2212 12/07/17 0608 12/07/17 0910  BP:  133/73 132/73   Pulse:  96 79   Resp:  16 16   Temp:  (!) 97.3 F (36.3 C) 97.9 F (36.6 C)   TempSrc:  Oral Oral   SpO2: 96% 96% 93% 96%  Weight:      Height:        Intake/Output Summary (Last 24 hours) at 12/07/2017 1036 Last data filed at 12/07/2017 0909 Gross per 24 hour  Intake 1230 ml  Output -  Net 1230 ml   Filed Weights   12/03/17 1110 12/06/17 0406  Weight: 88.5 kg (195 lb) 83.3 kg (183 lb 10.3 oz)    Examination: General exam: Appears calm and comfortable  Respiratory system: Clear to auscultation. Respiratory effort normal. Cardiovascular system: S1 & S2 heard, RRR. No JVD, murmurs, rubs, gallops or clicks. No pedal edema. Gastrointestinal system: Abdomen  is nondistended, soft and nontender. No organomegaly or masses felt. Normal bowel sounds heard. + Large ventral hernia Central nervous system: Alert and oriented. No focal neurological deficits. Extremities: Symmetric 5 x 5 power. Psychiatry: Judgement and insight appear normal. Mood & affect appropriate.    Data Reviewed: I have personally reviewed following labs and imaging studies  CBC: Recent Labs  Lab 12/03/17 1914 12/04/17 0620 12/05/17 0616 12/06/17 0608 12/07/17 0446  WBC 14.1* 15.1* 6.9 7.4 7.2    NEUTROABS 10.8* 12.3*  --   --   --   HGB 10.1* 11.3* 10.9* 10.6* 10.1*  HCT 31.2* 34.1* 33.7* 32.7* 31.2*  MCV 96.0 93.4 96.0 95.1 96.3  PLT 316 347 370 373 359   Basic Metabolic Panel: Recent Labs  Lab 12/03/17 1914 12/04/17 0620 12/05/17 0616 12/06/17 0608 12/07/17 0446  NA 138 141 142 140 141  K 4.0 5.0 4.6 4.2 4.3  CL 102 104 103 102 102  CO2 26 25 29 27 29   GLUCOSE 144* 94 107* 111* 120*  BUN 27* 19 14 12 15   CREATININE 1.56* 1.21 1.16 1.10 1.07  CALCIUM 9.1 9.3 9.6 9.3 9.1   GFR: Estimated Creatinine Clearance: 60.4 mL/min (by C-G formula based on SCr of 1.07 mg/dL). Liver Function Tests: Recent Labs  Lab 12/03/17 1914 12/04/17 0620  AST 21 18  ALT 34 32  ALKPHOS 71 73  BILITOT 0.1* 0.3  PROT 7.0 7.2  ALBUMIN 3.0* 2.9*   No results for input(s): LIPASE, AMYLASE in the last 168 hours. No results for input(s): AMMONIA in the last 168 hours. Coagulation Profile: No results for input(s): INR, PROTIME in the last 168 hours. Cardiac Enzymes: No results for input(s): CKTOTAL, CKMB, CKMBINDEX, TROPONINI in the last 168 hours. BNP (last 3 results) No results for input(s): PROBNP in the last 8760 hours. HbA1C: No results for input(s): HGBA1C in the last 72 hours. CBG: No results for input(s): GLUCAP in the last 168 hours. Lipid Profile: No results for input(s): CHOL, HDL, LDLCALC, TRIG, CHOLHDL, LDLDIRECT in the last 72 hours. Thyroid Function Tests: No results for input(s): TSH, T4TOTAL, FREET4, T3FREE, THYROIDAB in the last 72 hours. Anemia Panel: No results for input(s): VITAMINB12, FOLATE, FERRITIN, TIBC, IRON, RETICCTPCT in the last 72 hours. Sepsis Labs: Recent Labs  Lab 12/03/17 1920  LATICACIDVEN 1.42    Recent Results (from the past 240 hour(s))  Aerobic/Anaerobic Culture (surgical/deep wound)     Status: None (Preliminary result)   Collection Time: 12/05/17  9:59 AM  Result Value Ref Range Status   Specimen Description   Final    ABSCESS  ABDOMEN Performed at [facility], [address] [place], [place] [address]    Special Requests   Final    Normal Performed at [facility], [address], [place], [place] [address]    Gram Stain   Final    FEW WBC PRESENT, PREDOMINANTLY PMN ABUNDANT GRAM POSITIVE COCCI    Culture   Final    CULTURE REINCUBATED FOR BETTER GROWTH Performed at [facility], [address] [place], [place] [address]    Report Status PENDING  Incomplete       Radiology Studies: No results found.    Scheduled Meds: . atorvastatin  80 mg Oral Daily  . clonazePAM  1 mg Oral BID  . finasteride  5 mg Oral Daily  . FLUoxetine  20 mg Oral Daily  . fluticasone  1 spray Each Nare Daily  .  gabapentin  400 mg Oral BID  . gabapentin  800 mg Oral QHS  . ipratropium-albuterol  3 mL Nebulization BID  . lidocaine  1 patch Transdermal Q24H  . mometasone-formoterol  2 puff Inhalation BID  . rOPINIRole  4 mg Oral TID  . tamsulosin  0.4 mg Oral Daily   Continuous Infusions: . sodium chloride 10 mL/hr at 12/07/17 0435  . piperacillin-tazobactam (ZOSYN)  IV Stopped (12/07/17 0835)  . vancomycin Stopped (12/06/17 1746)     LOS: 4 days    Time spent: 20 minutes   Noralee Stain, DO [facility] [email or web address] Password [facility] 12/07/2017, 10:36 AM

## 2017-12-07 NOTE — Op Note (Addendum)
*   No surgery found *  9:19 AM  PATIENT:  Jack Vance, 77 y.o., male, MRN: [id number]  PREOP DIAGNOSIS:  Necrotic skin right abdominal wall  POSTOP DIAGNOSIS:   Necrotic skin right abdominal wall  PROCEDURE:   Debridement of skin right abdominal wall  SURGEON:   Ovidio Kin, M.D.  ANESTHESIA:   None  EBL:  None   INDICATIONS FOR PROCEDURE:  Jack Vance is a 77 y.o. (DOB: 1940-08-31) white male whose primary care physician is Patient, No Pcp Per who is admitted for an abscess of his right abdominal wall thought to be secondary to appendicitis.   The skin needs debridement.  PROCEDURE:   At the bedside, I debrided dead skin around the necrotic wound.  This converted the wound from 1 x 2 cm to 3 x 6 cm.  Will start daily dressing changes.  Excisional debridement:  1.  Tool used for debridement (curette, scapel, etc.)  scissors  2.  Frequency of surgical debridement.   Once  3.  Measurement of total devitalized tissue (wound surface) before and after surgical debridement.   1 x 2 cm before debridement.  3 x 6 cm after debridement  4.  Area and depth of devitalized tissue removed from wound.  18 cm2 of skin removed  5.  Blood loss and description of tissue removed.  Necrotic skin removed, no blood lsos  6.  Evidence of the progress of the wound's response to treatment.  A.  Current wound volume.  3 x 6 cm  B.  Presence of infection.  yes  C.  Presence of non viable tissue.  yes  D.  Other material in the wound that is expected to inhibit healing.  None - though may have underlying appendicitis   Wound prior to debridement   Ovidio Kin, MD, [facility] Pager: [phone] Office phone:  [phone]

## 2017-12-08 LAB — CBC
HEMATOCRIT: 32 % — AB (ref 39.0–52.0)
HEMOGLOBIN: 10.2 g/dL — AB (ref 13.0–17.0)
MCH: 31 pg (ref 26.0–34.0)
MCHC: 31.9 g/dL (ref 30.0–36.0)
MCV: 97.3 fL (ref 78.0–100.0)
PLATELETS: 314 10*3/uL (ref 150–400)
RBC: 3.29 MIL/uL — ABNORMAL LOW (ref 4.22–5.81)
RDW: 12.8 % (ref 11.5–15.5)
WBC: 7.3 10*3/uL (ref 4.0–10.5)

## 2017-12-08 LAB — BASIC METABOLIC PANEL
Anion gap: 9 (ref 5–15)
BUN: 15 mg/dL (ref 8–23)
CO2: 27 mmol/L (ref 22–32)
Calcium: 8.9 mg/dL (ref 8.9–10.3)
Chloride: 103 mmol/L (ref 98–111)
Creatinine, Ser: 1.06 mg/dL (ref 0.61–1.24)
GFR calc Af Amer: >60 mL/min (ref >60)
GLUCOSE: 151 mg/dL — AB (ref 70–99)
POTASSIUM: 4 mmol/L (ref 3.5–5.1)
Sodium: 139 mmol/L (ref 135–145)

## 2017-12-08 NOTE — Progress Notes (Signed)
PROGRESS NOTE    Jack Vance  ZOX:[id number] DOB: May 12, 1941 DOA: 12/03/2017 PCP: Patient, No Pcp Per     Brief Narrative:  Jack Vance a 77 y.o.malewithhistory of asthma, hyperlipidemia, restless leg syndrome, chronic kidney disease stage III and ventral hernia who started noticing swelling around his abdomen in the right lower quadrant around a week ago which slowly gave way and started having discharge purulent with some blood colored. This has been progressively worsening and patient decided to come to the ER. 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 General surgery was consulted. He underwent bedside debridement of right abdominal wall 7/20. Antibiotics deescalated to Rocephin.   New events last 24 hours / Subjective: No new events. Feeling well overall.   Assessment & Plan:   Principal Problem:   Abdominal visceral abscess (HCC) Active Problems:   Ruptured appendicitis   Restless leg syndrome   Normocytic normochromic anemia   Asthma   HLD (hyperlipidemia)   Abdominal wall abscess   Enterocutaneous fistula -General surgery following -IR for perc drainage 7/18 aspirated minimal fluid  -Leukocytosis resolved  -Continue wound care -Status post bedside debridement of the right abdominal wall 7/20 -Wound culture revealed pan-sensitive E Coli. Vanco/zosyn deescalated to Rocephin 2g daily   Hyperlipidemia -Continue lipitor  Depression/Anxiety -Continue klonopin, prozac   BPH -Continue proscar, flomax    DVT prophylaxis: SCD Code Status: Full Family Communication: No family at bedside Disposition Plan: Pending clinical improvement, hopefully home 7/22 if improved    Consultants:   General surgery  IR   Procedures:   IR perc drainage 7/18 aspirated minimal fluid - culture with E Coli  Bedside debridement 7/20 Dr. Ezzard Standing   Antimicrobials:  Anti-infectives (From admission, onward)   Start     Dose/Rate Route Frequency  Ordered Stop   12/07/17 1200  cefTRIAXone (ROCEPHIN) 2 g in sodium chloride 0.9 % 100 mL IVPB     2 g 200 mL/hr over 30 Minutes Intravenous Every 24 hours 12/07/17 1105     12/04/17 1800  vancomycin (VANCOCIN) IVPB 1000 mg/200 mL premix  Status:  Discontinued     1,000 mg 200 mL/hr over 60 Minutes Intravenous Every 24 hours 12/04/17 1306 12/07/17 1105   12/04/17 0400  piperacillin-tazobactam (ZOSYN) IVPB 3.375 g  Status:  Discontinued     3.375 g 12.5 mL/hr over 240 Minutes Intravenous Every 8 hours 12/03/17 2244 12/07/17 1105   12/03/17 1930  vancomycin (VANCOCIN) 2,000 mg in sodium chloride 0.9 % 500 mL IVPB     2,000 mg 250 mL/hr over 120 Minutes Intravenous  Once 12/03/17 1855 12/03/17 2148   12/03/17 1900  piperacillin-tazobactam (ZOSYN) IVPB 3.375 g     3.375 g 100 mL/hr over 30 Minutes Intravenous  Once 12/03/17 1845 12/03/17 2018       Objective: Vitals:   12/07/17 2251 12/08/17 0515 12/08/17 0600 12/08/17 0843  BP: 138/72 130/68    Pulse: 81 84  93  Resp: 20 20  18   Temp: 98.1 F (36.7 C) 98.6 F (37 C)    TempSrc: Oral Oral    SpO2: 95% 95%  99%  Weight:   84.6 kg (186 lb 8.2 oz)   Height:        Intake/Output Summary (Last 24 hours) at 12/08/2017 0921 Last data filed at 12/08/2017 0600 Gross per 24 hour  Intake 1100 ml  Output -  Net 1100 ml   Filed Weights   12/03/17 1110 12/06/17 0406 12/08/17 0600  Weight: 88.5 kg (195 lb) 83.3 kg (183 lb 10.3 oz) 84.6 kg (186 lb 8.2 oz)    Examination: General exam: Appears calm and comfortable  Respiratory system: Clear to auscultation. Respiratory effort normal. Cardiovascular system: S1 & S2 heard, RRR. No JVD, murmurs, rubs, gallops or clicks. No pedal edema. Gastrointestinal system: Abdomen is nondistended, soft and nontender. No organomegaly or masses felt. Normal bowel sounds heard. +large ventral hernia  Central nervous system: Alert and oriented. No focal neurological deficits. Extremities: Symmetric 5 x 5  power. Skin:     Psychiatry: Judgement and insight appear normal. Mood & affect appropriate.     Data Reviewed: I have personally reviewed following labs and imaging studies  CBC: Recent Labs  Lab 12/03/17 1914 12/04/17 0620 12/05/17 0616 12/06/17 0608 12/07/17 0446 12/08/17 0510  WBC 14.1* 15.1* 6.9 7.4 7.2 7.3  NEUTROABS 10.8* 12.3*  --   --   --   --   HGB 10.1* 11.3* 10.9* 10.6* 10.1* 10.2*  HCT 31.2* 34.1* 33.7* 32.7* 31.2* 32.0*  MCV 96.0 93.4 96.0 95.1 96.3 97.3  PLT 316 347 370 373 359 314   Basic Metabolic Panel: Recent Labs  Lab 12/04/17 0620 12/05/17 0616 12/06/17 0608 12/07/17 0446 12/08/17 0510  NA 141 142 140 141 139  K 5.0 4.6 4.2 4.3 4.0  CL 104 103 102 102 103  CO2 25 29 27 29 27   GLUCOSE 94 107* 111* 120* 151*  BUN 19 14 12 15 15   CREATININE 1.21 1.16 1.10 1.07 1.06  CALCIUM 9.3 9.6 9.3 9.1 8.9   GFR: Estimated Creatinine Clearance: 61.4 mL/min (by C-G formula based on SCr of 1.06 mg/dL). Liver Function Tests: Recent Labs  Lab 12/03/17 1914 12/04/17 0620  AST 21 18  ALT 34 32  ALKPHOS 71 73  BILITOT 0.1* 0.3  PROT 7.0 7.2  ALBUMIN 3.0* 2.9*   No results for input(s): LIPASE, AMYLASE in the last 168 hours. No results for input(s): AMMONIA in the last 168 hours. Coagulation Profile: No results for input(s): INR, PROTIME in the last 168 hours. Cardiac Enzymes: No results for input(s): CKTOTAL, CKMB, CKMBINDEX, TROPONINI in the last 168 hours. BNP (last 3 results) No results for input(s): PROBNP in the last 8760 hours. HbA1C: No results for input(s): HGBA1C in the last 72 hours. CBG: No results for input(s): GLUCAP in the last 168 hours. Lipid Profile: No results for input(s): CHOL, HDL, LDLCALC, TRIG, CHOLHDL, LDLDIRECT in the last 72 hours. Thyroid Function Tests: No results for input(s): TSH, T4TOTAL, FREET4, T3FREE, THYROIDAB in the last 72 hours. Anemia Panel: No results for input(s): VITAMINB12, FOLATE, FERRITIN, TIBC,  IRON, RETICCTPCT in the last 72 hours. Sepsis Labs: Recent Labs  Lab 12/03/17 1920  LATICACIDVEN 1.42    Recent Results (from the past 240 hour(s))  Aerobic/Anaerobic Culture (surgical/deep wound)     Status: None (Preliminary result)   Collection Time: 12/05/17  9:59 AM  Result Value Ref Range Status   Specimen Description   Final    ABSCESS ABDOMEN Performed at [facility], [address], [place], [place] [address]    Special Requests   Final    Normal Performed at [facility], [address], [place], [place] [address]    Gram Stain   Final    FEW WBC PRESENT, PREDOMINANTLY PMN ABUNDANT GRAM POSITIVE COCCI Performed at [facility], [address], [place], [place] [address]    Culture   Final  FEW ESCHERICHIA COLI NO ANAEROBES ISOLATED; CULTURE IN PROGRESS FOR 5 DAYS    Report Status PENDING  Incomplete   Organism ID, Bacteria ESCHERICHIA COLI  Final      Susceptibility   Escherichia coli - MIC*    AMPICILLIN 4 SENSITIVE Sensitive     CEFAZOLIN <=4 SENSITIVE Sensitive     CEFEPIME <=1 SENSITIVE Sensitive     CEFTAZIDIME <=1 SENSITIVE Sensitive     CEFTRIAXONE <=1 SENSITIVE Sensitive     CIPROFLOXACIN <=0.25 SENSITIVE Sensitive     GENTAMICIN <=1 SENSITIVE Sensitive     IMIPENEM <=0.25 SENSITIVE Sensitive     TRIMETH/SULFA <=20 SENSITIVE Sensitive     AMPICILLIN/SULBACTAM <=2 SENSITIVE Sensitive     PIP/TAZO <=4 SENSITIVE Sensitive     Extended ESBL NEGATIVE Sensitive     * FEW ESCHERICHIA COLI       Radiology Studies: No results found.    Scheduled Meds: . atorvastatin  80 mg Oral Daily  . clonazePAM  1 mg Oral BID  . finasteride  5 mg Oral Daily  . FLUoxetine  20 mg Oral Daily  . fluticasone  1 spray Each Nare Daily  . gabapentin  400 mg Oral BID  . gabapentin  800 mg Oral QHS  . ipratropium-albuterol  3 mL Nebulization BID  . lidocaine  1 patch Transdermal Q24H  . mometasone-formoterol  2 puff  Inhalation BID  . rOPINIRole  4 mg Oral TID  . tamsulosin  0.4 mg Oral Daily   Continuous Infusions: . sodium chloride 10 mL/hr at 12/07/17 0435  . cefTRIAXone (ROCEPHIN)  IV Stopped (12/07/17 1257)     LOS: 5 days    Time spent: 20 minutes   Noralee Stain, DO [facility] [email or web address] Password [facility] 12/08/2017, 9:21 AM

## 2017-12-08 NOTE — Plan of Care (Signed)
Patient in more pain tonight than previous.   Still ambulating well in hall.   Concerned about antibiotics with belief that he needs more.  Spent significant time teaching him about resistance and selection of appropriate antibiotics by physicians.

## 2017-12-08 NOTE — Progress Notes (Signed)
[facility] Office:  [phone] General Surgery Progress Note   LOS: 5 days  POD -     Chief Complaint: Abdominal infection  Assessment and Plan: 1.  Ruptured appendix/abdominal wall abscess  WBC - 7,300 - 12/08/2017  Zosyn/Vancomycin  Wound looks much better today. BID dressing changes. He can get in the shower.  Plan will be local wound care at home.  It will be almost impossible to go in to remove his appendix (at this time or any time)  2.  Large abdominal wall hernia (He had HH surgery in [facility] around 1998) with loss of domain 3.  Asthma 4.  Hiatal hernia 5.  Bilateral inguinal hernias 6.  Hard of hearing 7.  DVT prophylaxis - not on chemoprophylaxis at this time   Principal Problem:   Abdominal visceral abscess (HCC) Active Problems:   Ruptured appendicitis   Restless leg syndrome   Normocytic normochromic anemia   Asthma   HLD (hyperlipidemia)   Abdominal wall abscess  Subjective:  Doing okay.  Hard of hearing.  Sister, Marvel Plan, at bedside.  I answered some surgical quesitons.  Objective:   Vitals:   12/07/17 2251 12/08/17 0515  BP: 138/72 130/68  Pulse: 81 84  Resp: 20 20  Temp: 98.1 F (36.7 C) 98.6 F (37 C)  SpO2: 95% 95%     Intake/Output from previous day:  07/20 0701 - 07/21 0700 In: 1390 [P.O.:960; I.V.:280; IV Piggyback:150] Out: -   Intake/Output this shift:  No intake/output data recorded.   Physical Exam:   General: Older WM who is alert and oriented.   Hard of hearing.   HEENT: Normal. Pupils equal. .   Lungs: Clear   Abdomen: Obese and large abdominal wall hernia.   Wound: Right mid abdomen wound looks much cleaner today.  No obvious stool in wound, so no evidence of fistula.   Lab Results:    Recent Labs    12/07/17 0446 12/08/17 0510  WBC 7.2 7.3  HGB 10.1* 10.2*  HCT 31.2* 32.0*  PLT 359 314    BMET   Recent Labs    12/07/17 0446 12/08/17 0510  NA 141 139  K 4.3 4.0  CL 102 103  CO2 29  27  GLUCOSE 120* 151*  BUN 15 15  CREATININE 1.07 1.06  CALCIUM 9.1 8.9    PT/INR  No results for input(s): LABPROT, INR in the last 72 hours.  ABG  No results for input(s): PHART, HCO3 in the last 72 hours.  Invalid input(s): PCO2, PO2   Studies/Results:  No results found.   Anti-infectives:   Anti-infectives (From admission, onward)   Start     Dose/Rate Route Frequency Ordered Stop   12/07/17 1200  cefTRIAXone (ROCEPHIN) 2 g in sodium chloride 0.9 % 100 mL IVPB     2 g 200 mL/hr over 30 Minutes Intravenous Every 24 hours 12/07/17 1105     12/04/17 1800  vancomycin (VANCOCIN) IVPB 1000 mg/200 mL premix  Status:  Discontinued     1,000 mg 200 mL/hr over 60 Minutes Intravenous Every 24 hours 12/04/17 1306 12/07/17 1105   12/04/17 0400  piperacillin-tazobactam (ZOSYN) IVPB 3.375 g  Status:  Discontinued     3.375 g 12.5 mL/hr over 240 Minutes Intravenous Every 8 hours 12/03/17 2244 12/07/17 1105   12/03/17 1930  vancomycin (VANCOCIN) 2,000 mg in sodium chloride 0.9 % 500 mL IVPB     2,000 mg 250 mL/hr over 120 Minutes Intravenous  Once 12/03/17 1855 12/03/17 2148   12/03/17 1900  piperacillin-tazobactam (ZOSYN) IVPB 3.375 g     3.375 g 100 mL/hr over 30 Minutes Intravenous  Once 12/03/17 1845 12/03/17 2018      Ovidio Kin, MD, FACS Pager: [phone] [facility] Office: [phone] 12/08/2017

## 2017-12-09 LAB — CBC
HCT: 31 % — ABNORMAL LOW (ref 39.0–52.0)
HEMOGLOBIN: 9.8 g/dL — AB (ref 13.0–17.0)
MCH: 30.8 pg (ref 26.0–34.0)
MCHC: 31.6 g/dL (ref 30.0–36.0)
MCV: 97.5 fL (ref 78.0–100.0)
Platelets: 309 10*3/uL (ref 150–400)
RBC: 3.18 MIL/uL — AB (ref 4.22–5.81)
RDW: 12.8 % (ref 11.5–15.5)
WBC: 7.5 10*3/uL (ref 4.0–10.5)

## 2017-12-09 MED ORDER — CEPHALEXIN 500 MG PO CAPS
500.0000 mg | ORAL_CAPSULE | Freq: Four times a day (QID) | ORAL | 0 refills | Status: AC
Start: 2017-12-09 — End: 2017-12-14

## 2017-12-09 MED ORDER — CEPHALEXIN 500 MG PO CAPS
500.0000 mg | ORAL_CAPSULE | Freq: Four times a day (QID) | ORAL | Status: DC
  Administered 2017-12-09: 500 mg via ORAL
  Filled 2017-12-09: qty 1

## 2017-12-09 NOTE — Progress Notes (Signed)
Shanda Bumps in to see patient and she changed dressing except for pink foam dressing. Pink foam dressing applied per new order, avoiding tape.  Lina Sar, RN

## 2017-12-09 NOTE — Care Management Important Message (Signed)
Important Message  Patient Details  Name: Jack Vance MRN: [id number] Date of Birth: Feb 27, 1941   Medicare Important Message Given:  Yes    Caren Macadam 12/09/2017, 11:15 AMImportant Message  Patient Details  Name: Jack Vance MRN: [id number] Date of Birth: Feb 20, 1941   Medicare Important Message Given:  Yes    Caren Macadam 12/09/2017, 11:15 AM

## 2017-12-09 NOTE — Progress Notes (Signed)
[facility]/Trauma Progress Note      Assessment/Plan Principal Problem:   Abdominal visceral abscess (HCC) Active Problems:   Ruptured appendicitis   Restless leg syndrome   Normocytic normochromic anemia   Asthma   HLD (hyperlipidemia)   Abdominal wall abscess Asthma Hiatal hernia Bilateral inguinal hernias Hard of hearing  Ruptured appendix/abdominal wall abscess - BID dressing changes. He can get in the shower. - Plan will be local wound care at home.  It will be almost impossible to go in to remove his appendix (at this time or any time) - Okay for PO antibiotics for a total of 10-12 days of abx Large abdominal wall hernia (He had HH surgery in [facility] around 1998) with loss of domain  FEN: soft diet VTE: SCD's ID: Vanc & Zosyn 07/16-07/20; Rocephin 07/20>> Foley: none Follow up: Dr. Ezzard Standing  DISPO: Okay to transition to PO abx. Okay for discharge from a surgical standpoint. Pt will need HH for wound care. Foam dressing only, avoid tape.    LOS: 6 days    Subjective: CC: appendicitis  Pt denies abdominal pain, nausea or vomiting. Having flatus. Pt states it is very itchy around wound. Ready to go home. He states he can do his own dressing changes.   Objective: Vital signs in last 24 hours: Temp:  [97.5 F (36.4 C)-98.6 F (37 C)] 98.6 F (37 C) (07/22 0518) Pulse Rate:  [70-85] 70 (07/22 0518) Resp:  [13-18] 18 (07/22 0518) BP: (116-131)/(60-74) 116/65 (07/22 0518) SpO2:  [93 %-96 %] 96 % (07/22 0738) Weight:  [85.2 kg (187 lb 12.8 oz)] 85.2 kg (187 lb 12.8 oz) (07/22 0343) Last BM Date: 12/08/17  Intake/Output from previous day: 07/21 0701 - 07/22 0700 In: 762.7 [P.O.:560; I.V.:202.7] Out: -  Intake/Output this shift: No intake/output data recorded.  PE: Gen:  Alert, NAD, pleasant, cooperative Pulm:  Rate and effort normal Abd: Soft, NT, obese and large abdominal wall hernia, +BS, wound with no purulent drainage noted. Itchy  around inferior to wound. Suspect erythema is from skin irritation from tape. Wound repacked. See photo below Skin: no rashes noted, warm and dry      Anti-infectives: Anti-infectives (From admission, onward)   Start     Dose/Rate Route Frequency Ordered Stop   12/07/17 1200  cefTRIAXone (ROCEPHIN) 2 g in sodium chloride 0.9 % 100 mL IVPB     2 g 200 mL/hr over 30 Minutes Intravenous Every 24 hours 12/07/17 1105     12/04/17 1800  vancomycin (VANCOCIN) IVPB 1000 mg/200 mL premix  Status:  Discontinued     1,000 mg 200 mL/hr over 60 Minutes Intravenous Every 24 hours 12/04/17 1306 12/07/17 1105   12/04/17 0400  piperacillin-tazobactam (ZOSYN) IVPB 3.375 g  Status:  Discontinued     3.375 g 12.5 mL/hr over 240 Minutes Intravenous Every 8 hours 12/03/17 2244 12/07/17 1105   12/03/17 1930  vancomycin (VANCOCIN) 2,000 mg in sodium chloride 0.9 % 500 mL IVPB     2,000 mg 250 mL/hr over 120 Minutes Intravenous  Once 12/03/17 1855 12/03/17 2148   12/03/17 1900  piperacillin-tazobactam (ZOSYN) IVPB 3.375 g     3.375 g 100 mL/hr over 30 Minutes Intravenous  Once 12/03/17 1845 12/03/17 2018      Lab Results:  Recent Labs    12/08/17 0510 12/09/17 0412  WBC 7.3 7.5  HGB 10.2* 9.8*  HCT 32.0* 31.0*  PLT 314 309   BMET Recent Labs    12/07/17  0446 12/08/17 0510  NA 141 139  K 4.3 4.0  CL 102 103  CO2 29 27  GLUCOSE 120* 151*  BUN 15 15  CREATININE 1.07 1.06  CALCIUM 9.1 8.9   PT/INR No results for input(s): LABPROT, INR in the last 72 hours. CMP     Component Value Date/Time   NA 139 12/08/2017 0510   K 4.0 12/08/2017 0510   CL 103 12/08/2017 0510   CO2 27 12/08/2017 0510   GLUCOSE 151 (H) 12/08/2017 0510   BUN 15 12/08/2017 0510   CREATININE 1.06 12/08/2017 0510   CALCIUM 8.9 12/08/2017 0510   PROT 7.2 12/04/2017 0620   ALBUMIN 2.9 (L) 12/04/2017 0620   AST 18 12/04/2017 0620   ALT 32 12/04/2017 0620   ALKPHOS 73 12/04/2017 0620   BILITOT 0.3 12/04/2017 0620     GFRNONAA >60 12/08/2017 0510   GFRAA >60 12/08/2017 0510   Lipase  No results found for: LIPASE  Studies/Results: No results found.    Jerre Simon , [facility] 12/09/2017, 9:04 AM  Pager: [phone] Mon-Wed, Friday 7:00am-4:30pm Thurs 7am-11:30am  Consults: [phone]

## 2017-12-09 NOTE — Care Management Note (Signed)
Case Management Note  Patient Details  Name: TRINO SCISM MRN: [id number] Date of Birth: 1940-12-16  Subjective/Objective:   77 yo admitted with abdominal abscess                 Action/Plan: From home. HH order for [facility]. This CM clarified with attending that home health wound care is for just the foam only. [facility] chosen and rep alerted of order. Rep confirmed that pt could have HHRN for just wound care with foam and not packing.   Expected Discharge Date:  12/09/17               Expected Discharge Plan:  Home w Home Health Services  In-House Referral:     Discharge planning Services  CM Consult  Post Acute Care Choice:  Home Health Choice offered to:  Patient  DME Arranged:    DME Agency:     HH Arranged:  RN HH Agency:  [facility]  Status of Service:  In process, will continue to follow  If discussed at Long Length of Stay Meetings, dates discussed:    Additional CommentsBartholome Bill, RN 12/09/2017, 1:59 PM  [phone]

## 2017-12-09 NOTE — Discharge Summary (Signed)
Physician Discharge Summary  DREYTON ROESSNER ZOX:[id number] DOB: 1940-10-20 DOA: 12/03/2017  PCP: Patient, No Pcp Per  Admit date: 12/03/2017 Discharge date: 12/09/2017  Admitted From: Home Disposition: Home  Recommendations for Outpatient Follow-up:  1. Follow up with [facility] 7/26 and 8/7   Home Health: RN  Wound care: cover with foam dressing daily, avoid tape  Discharge Condition: Stable CODE STATUS: Full   Diet recommendation: Regular  Brief/Interim Summary: Kainen Struckman Shinnis a 77 y.o.malewithhistory of asthma, hyperlipidemia, restless leg syndrome, chronic kidney disease stage III and ventral herniawhostarted noticing swelling around his abdomen in the right lower quadrant around a week ago which slowly gave way and started having discharge purulent with some blood colored. This has been progressively worsening and patient decided to come to the ER. 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 General surgery was consulted.He underwent bedside debridement of right abdominal wall 7/20. Antibiotics deescalated to Rocephin and then subsequently to Keflex.   Discharge Diagnoses:  Principal Problem:   Abdominal visceral abscess (HCC) Active Problems:   Ruptured appendicitis   Restless leg syndrome   Normocytic normochromic anemia   Asthma   HLD (hyperlipidemia)   Abdominal wall abscess   Enterocutaneous fistula -General surgery following -IR for perc drainage 7/18 aspirated minimal fluid  -Leukocytosis resolved  -Continue wound care -Status post bedside debridement of the right abdominal wall 7/20 -Wound culture revealed pan-sensitive E Coli. Vanco/zosyn deescalated to Rocephin 2g daily --> Keflex total 12 days   Hyperlipidemia -Continue lipitor  Depression/Anxiety -Continue klonopin, prozac   BPH -Continue proscar, flomax     Discharge Instructions  Discharge Instructions    Call MD for:  difficulty breathing,  headache or visual disturbances   Complete by:  As directed    Call MD for:  extreme fatigue   Complete by:  As directed    Call MD for:  hives   Complete by:  As directed    Call MD for:  persistant dizziness or light-headedness   Complete by:  As directed    Call MD for:  persistant nausea and vomiting   Complete by:  As directed    Call MD for:  redness, tenderness, or signs of infection (pain, swelling, redness, odor or green/yellow discharge around incision site)   Complete by:  As directed    Call MD for:  severe uncontrolled pain   Complete by:  As directed    Call MD for:  temperature >100.4   Complete by:  As directed    Change dressing (specify)   Complete by:  As directed    Dressing change: daily using foam dressing, avoid tape. Keep wound clean and dry.   Diet general   Complete by:  As directed    Discharge instructions   Complete by:  As directed    You were cared for by a hospitalist during your hospital stay. If you have any questions about your discharge medications or the care you received while you were in the hospital after you are discharged, you can call the unit and ask to speak with the hospitalist on call if the hospitalist that took care of you is not available. Once you are discharged, your primary care physician will handle any further medical issues. Please note that NO REFILLS for any discharge medications will be authorized once you are discharged, as it is imperative that you return to your primary care physician (or establish a relationship with a primary care physician if  you do not have one) for your aftercare needs so that they can reassess your need for medications and monitor your lab values.   Increase activity slowly   Complete by:  As directed      Allergies as of 12/09/2017      Reactions   Cheese    Causes asthma   Morphine And Related Rash      Medication List    TAKE these medications   atorvastatin 80 MG tablet Commonly known as:   LIPITOR Take 80 mg by mouth daily.   benzonatate 100 MG capsule Commonly known as:  TESSALON Take 100 mg by mouth 3 (three) times daily as needed for cough.   budesonide-formoterol 160-4.5 MCG/ACT inhaler Commonly known as:  SYMBICORT Inhale 2 puffs into the lungs 2 (two) times daily.   cephALEXin 500 MG capsule Commonly known as:  KEFLEX Take 1 capsule (500 mg total) by mouth every 6 (six) hours for 5 days.   clonazePAM 1 MG tablet Commonly known as:  KLONOPIN Take 1 mg by mouth 2 (two) times daily.   COMBIVENT RESPIMAT 20-100 MCG/ACT Aers respimat Generic drug:  Ipratropium-Albuterol Inhale 1 puff into the lungs every 6 (six) hours.   finasteride 5 MG tablet Commonly known as:  PROSCAR Take 5 mg by mouth daily.   FLUoxetine 20 MG capsule Commonly known as:  PROZAC Take 20 mg by mouth daily.   fluticasone 50 MCG/ACT nasal spray Commonly known as:  FLONASE Place 1 spray into both nostrils daily.   gabapentin 400 MG capsule Commonly known as:  NEURONTIN Take 400-800 mg by mouth 3 (three) times daily. Take 1 capsule BID and Take 2 capsules QHS   lidocaine 5 % Commonly known as:  LIDODERM Place 1 patch onto the skin daily. Remove & Discard patch within 12 hours or as directed by MD   rOPINIRole 2 MG tablet Commonly known as:  REQUIP Take 4 mg by mouth 3 (three) times daily.   tamsulosin 0.4 MG Caps capsule Commonly known as:  FLOMAX Take 0.4 mg by mouth daily.   triamterene-hydrochlorothiazide 37.5-25 MG tablet Commonly known as:  MAXZIDE-25 Take 1 tablet by mouth daily.            Discharge Care Instructions  (From admission, onward)        Start     Ordered   12/09/17 0000  Change dressing (specify)    Comments:  Dressing change: daily using foam dressing, avoid tape. Keep wound clean and dry.   12/09/17 1159     Follow-up Information    [facility] Go on 12/13/2017.   Specialty:  General Surgery Why:  Your appointment is 7/26  at 11:30AM for wound check Please arrive 30 minutes prior to your appointment to check in and fill out paperwork. Bring photo ID and insurance information. Contact information: [address] [place] [address] [phone]       Andria Meuse, MD. Go on 12/25/2017.   Specialty:  General Surgery Why:  Your appointment is 8/7 at 3:45PM Please arrive 15 minutes early to check in. Contact information: [address] [place] [address] [phone]          Allergies  Allergen Reactions  . Cheese     Causes asthma  . Morphine And Related Rash    Consultations:  General surgery    Procedures/Studies: [place] Abscess Drain  Result Date: 12/05/2017 INDICATION: Draining necrotic wound of the  right lateral abdominal wall with CT demonstrating more focal component fluid in the superficial soft tissues. EXAM: ULTRASOUND-GUIDED ASPIRATION OF ABDOMINAL WALL FLUID COLLECTION MEDICATIONS: The patient is currently admitted to the hospital and receiving intravenous antibiotics. The antibiotics were administered within an appropriate time frame prior to the initiation of the procedure. ANESTHESIA/SEDATION: Fentanyl 75 mcg IV; Versed 2.0 mg IV Moderate Sedation Time:  16 minutes. The patient was continuously monitored during the procedure by the interventional radiology nurse under my direct supervision. COMPLICATIONS: None immediate. PROCEDURE: Informed written consent was obtained from the patient after a thorough discussion of the procedural risks, benefits and alternatives. All questions were addressed. Maximal Sterile Barrier Technique was utilized including caps, mask, sterile gowns, sterile gloves, sterile drape, hand hygiene and skin antiseptic. A timeout was performed prior to the initiation of the procedure. Ultrasound was performed at the level of the right lower abdominal wall. Under ultrasound guidance, a 5 [place] Yueh centesis catheter was  advanced over a 19 gauge needle into several areas of soft tissue and complex fluid in the right abdominal wall subcutaneous tissues. An 18 gauge spinal needle was also utilized in performing aspiration in several areas of the right abdominal wall. An aspirate sample was combined with 2 mL of sterile saline and sent for culture analysis. FINDINGS: Ultrasound shows a complex area inflammation and loculated complex fluid in the subcutaneous soft tissues of the right abdominal wall. Aspiration in multiple areas did not yield any free fluid. Only a few drops of dark bloody fluid were able to be aspirated which was combined with sterile saline and sent for culture analysis. IMPRESSION: Complex inflammatory process in the right abdominal wall with no significant component of free fluid. No purulent fluid or free fluid was able to be aspirated. A few drops of dark bloody fluid were aspirated and combined with sterile saline. The sample was sent for culture analysis. Electronically Signed   By: Irish Lack M.D.   On: 12/05/2017 13:13   Ct Abdomen Pelvis W Contrast  Result Date: 12/03/2017 CLINICAL DATA:  Expanding focal erythematous lesion on the right abdomen with drainage, erythema, and increasing pain. Nausea and vomiting. EXAM: CT ABDOMEN AND PELVIS WITH CONTRAST TECHNIQUE: Multidetector CT imaging of the abdomen and pelvis was performed using the standard protocol following bolus administration of intravenous contrast. CONTRAST:  75mL OMNIPAQUE IOHEXOL 300 MG/ML  SOLN COMPARISON:  02/24/2016 FINDINGS: Lower chest: Mild patchy interstitial accentuation in the lung bases. 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 Hepatobiliary: Mildly contracted gallbladder. Pancreas: Unremarkable aside from the pancreatic body extending up into the chest as part of a hiatal hernia. Spleen: Unremarkable Adrenals/Urinary Tract: Unremarkable Stomach/Bowel: Large ventral hernia with rectus diastasis. Ventral  hernia contains stomach body and antrum, cecum, ascending colon, and transverse colon; and multiple loops of small bowel including the terminal ileum. Along the right side of the hernia, there is an abnormal localized fluid collection measuring approximately 6.1 by 6.9 by 6.4 cm, with gas density along its upper margin tracking out towards the skin. The appearance is suspicious for extraluminal fluid and possibly abscess. The possibility of acute appendicitis with periappendiceal abscess and drainage to the cutaneous surface is a distinct possibility. Postoperative findings in the proximal stomach. Vascular/Lymphatic: Aortoiliac atherosclerotic vascular disease. Reproductive: Unremarkable Other: No supplemental non-categorized findings. Musculoskeletal: 7 mm degenerative anterolisthesis at L4-5 with multilevel spondylosis and degenerative disc disease. Prominent central narrowing of the thecal sac at L4-5. Bilateral direct inguinal hernias contain adipose  tissue. IMPRESSION: 1. Abnormal inflammatory process in the right side of the large ventral hernia sac with abnormal fluid and small amounts of gas which are probably extraluminal compatible with abscess, and extending out to the cutaneous surface along the lateral margin of the hernia. Contained localized perforated appendicitis is a distinct possibility, as this was the location previously occupied by the appendix on the 11/24/2015 CT scan. The large ventral hernia sac contains part of the stomach, the distal ileum, the cecum and ascending colon, and the transverse colon. 2. 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 3. Bilateral direct inguinal hernias contain adipose tissue. 4. Other imaging findings of potential clinical significance: Mild interstitial accentuation in the lung bases, chronic. Postoperative stone findings in the proximal stomach. Aortic Atherosclerosis (ICD10-I70.0). Prominent central narrowing of the thecal sac at L4-5 due to  degenerative anterolisthesis and spurring. These results were called by telephone at the time of interpretation on 12/03/2017 at 8:53 pm to Dr. Shaune Pollack , who verbally acknowledged these results. Electronically Signed   By: Gaylyn Rong M.D.   On: 12/03/2017 20:53      Discharge Exam: Vitals:   12/09/17 0518 12/09/17 0738  BP: 116/65   Pulse: 70   Resp: 18   Temp: 98.6 F (37 C)   SpO2: 94% 96%    General: Pt is alert, awake, not in acute distress Cardiovascular: RRR, S1/S2 +, no rubs, no gallops Respiratory: CTA bilaterally, no wheezing, no rhonchi Abdominal: Soft, NT, ND, bowel sounds +, large ventral hernia Extremities: no edema, no cyanosis    The results of significant diagnostics from this hospitalization (including imaging, microbiology, ancillary and laboratory) are listed below for reference.     Microbiology: Recent Results (from the past 240 hour(s))  Aerobic/Anaerobic Culture (surgical/deep wound)     Status: None (Preliminary result)   Collection Time: 12/05/17  9:59 AM  Result Value Ref Range Status   Specimen Description   Final    ABSCESS ABDOMEN Performed at [facility], [address], [place], [place] [address]    Special Requests   Final    Normal Performed at [facility], [address] [employer] [place], [place] [address]    Gram Stain   Final    FEW WBC PRESENT, PREDOMINANTLY PMN ABUNDANT GRAM POSITIVE COCCI Performed at [facility], [address], [place], [place] [address]    Culture   Final    FEW ESCHERICHIA COLI NO ANAEROBES ISOLATED; CULTURE IN PROGRESS FOR 5 DAYS    Report Status PENDING  Incomplete   Organism ID, Bacteria ESCHERICHIA COLI  Final      Susceptibility   Escherichia coli - MIC*    AMPICILLIN 4 SENSITIVE Sensitive     CEFAZOLIN <=4 SENSITIVE Sensitive     CEFEPIME <=1 SENSITIVE Sensitive     CEFTAZIDIME <=1 SENSITIVE Sensitive     CEFTRIAXONE <=1 SENSITIVE Sensitive      CIPROFLOXACIN <=0.25 SENSITIVE Sensitive     GENTAMICIN <=1 SENSITIVE Sensitive     IMIPENEM <=0.25 SENSITIVE Sensitive     TRIMETH/SULFA <=20 SENSITIVE Sensitive     AMPICILLIN/SULBACTAM <=2 SENSITIVE Sensitive     PIP/TAZO <=4 SENSITIVE Sensitive     Extended ESBL NEGATIVE Sensitive     * FEW ESCHERICHIA COLI     Labs: BNP (last 3 results) No results for input(s): BNP in the last 8760 hours. Basic Metabolic Panel: Recent Labs  Lab 12/04/17 0620 12/05/17 0616 12/06/17 9147 12/07/17  0446 12/08/17 0510  NA 141 142 140 141 139  K 5.0 4.6 4.2 4.3 4.0  CL 104 103 102 102 103  CO2 25 29 27 29 27   GLUCOSE 94 107* 111* 120* 151*  BUN 19 14 12 15 15   CREATININE 1.21 1.16 1.10 1.07 1.06  CALCIUM 9.3 9.6 9.3 9.1 8.9   Liver Function Tests: Recent Labs  Lab 12/03/17 1914 12/04/17 0620  AST 21 18  ALT 34 32  ALKPHOS 71 73  BILITOT 0.1* 0.3  PROT 7.0 7.2  ALBUMIN 3.0* 2.9*   No results for input(s): LIPASE, AMYLASE in the last 168 hours. No results for input(s): AMMONIA in the last 168 hours. CBC: Recent Labs  Lab 12/03/17 1914 12/04/17 0620 12/05/17 0616 12/06/17 0608 12/07/17 0446 12/08/17 0510 12/09/17 0412  WBC 14.1* 15.1* 6.9 7.4 7.2 7.3 7.5  NEUTROABS 10.8* 12.3*  --   --   --   --   --   HGB 10.1* 11.3* 10.9* 10.6* 10.1* 10.2* 9.8*  HCT 31.2* 34.1* 33.7* 32.7* 31.2* 32.0* 31.0*  MCV 96.0 93.4 96.0 95.1 96.3 97.3 97.5  PLT 316 347 370 373 359 314 309   Cardiac Enzymes: No results for input(s): CKTOTAL, CKMB, CKMBINDEX, TROPONINI in the last 168 hours. BNP: Invalid input(s): POCBNP CBG: No results for input(s): GLUCAP in the last 168 hours. D-Dimer No results for input(s): DDIMER in the last 72 hours. Hgb A1c No results for input(s): HGBA1C in the last 72 hours. Lipid Profile No results for input(s): CHOL, HDL, LDLCALC, TRIG, CHOLHDL, LDLDIRECT in the last 72 hours. Thyroid function studies No results for input(s): TSH, T4TOTAL, T3FREE, THYROIDAB in  the last 72 hours.  Invalid input(s): FREET3 Anemia work up No results for input(s): VITAMINB12, FOLATE, FERRITIN, TIBC, IRON, RETICCTPCT in the last 72 hours. Urinalysis    Component Value Date/Time   COLORURINE YELLOW 05/19/2013 0613   APPEARANCEUR HAZY (A) 05/19/2013 0613   LABSPEC 1.023 05/19/2013 0613   PHURINE 6.0 05/19/2013 0613   GLUCOSEU NEGATIVE 05/19/2013 0613   HGBUR NEGATIVE 05/19/2013 0613   BILIRUBINUR NEGATIVE 05/19/2013 0613   KETONESUR 15 (A) 05/19/2013 0613   PROTEINUR NEGATIVE 05/19/2013 0613   UROBILINOGEN 0.2 05/19/2013 0613   NITRITE NEGATIVE 05/19/2013 0613   LEUKOCYTESUR NEGATIVE 05/19/2013 9021   Sepsis Labs Invalid input(s): PROCALCITONIN,  WBC,  LACTICIDVEN Microbiology Recent Results (from the past 240 hour(s))  Aerobic/Anaerobic Culture (surgical/deep wound)     Status: None (Preliminary result)   Collection Time: 12/05/17  9:59 AM  Result Value Ref Range Status   Specimen Description   Final    ABSCESS ABDOMEN Performed at [facility], [address] [place], [place] [address]    Special Requests   Final    Normal Performed at [facility], [address], [place], [place] [address]    Gram Stain   Final    FEW WBC PRESENT, PREDOMINANTLY PMN ABUNDANT GRAM POSITIVE COCCI Performed at [facility], [address], [place], [place] [address]    Culture   Final    FEW ESCHERICHIA COLI NO ANAEROBES ISOLATED; CULTURE IN PROGRESS FOR 5 DAYS    Report Status PENDING  Incomplete   Organism ID, Bacteria ESCHERICHIA COLI  Final      Susceptibility   Escherichia coli - MIC*    AMPICILLIN 4 SENSITIVE Sensitive     CEFAZOLIN <=4 SENSITIVE Sensitive     CEFEPIME <=1 SENSITIVE Sensitive  CEFTAZIDIME <=1 SENSITIVE Sensitive     CEFTRIAXONE <=1 SENSITIVE Sensitive     CIPROFLOXACIN <=0.25 SENSITIVE Sensitive     GENTAMICIN <=1 SENSITIVE Sensitive     IMIPENEM <=0.25 SENSITIVE Sensitive      TRIMETH/SULFA <=20 SENSITIVE Sensitive     AMPICILLIN/SULBACTAM <=2 SENSITIVE Sensitive     PIP/TAZO <=4 SENSITIVE Sensitive     Extended ESBL NEGATIVE Sensitive     * FEW ESCHERICHIA COLI     Patient was seen and examined on the day of discharge and was found to be in stable condition. Time coordinating discharge: 25 minutes including assessment and coordination of care, as well as examination of the patient.   SIGNED:  Noralee Stain, DO [facility] Pager [phone]  If 7PM-7AM, please contact night-coverage [email or web address] Password [facility] 12/09/2017, 11:59 AM

## 2017-12-10 LAB — AEROBIC/ANAEROBIC CULTURE W GRAM STAIN (SURGICAL/DEEP WOUND): Special Requests: NORMAL

## 2020-03-31 ENCOUNTER — Other Ambulatory Visit (HOSPITAL_BASED_OUTPATIENT_CLINIC_OR_DEPARTMENT_OTHER): Payer: Self-pay

## 2020-03-31 DIAGNOSIS — G4700 Insomnia, unspecified: Secondary | ICD-10-CM

## 2020-05-04 ENCOUNTER — Ambulatory Visit (HOSPITAL_BASED_OUTPATIENT_CLINIC_OR_DEPARTMENT_OTHER): Payer: No Typology Code available for payment source | Attending: Internal Medicine | Admitting: Internal Medicine

## 2020-05-04 ENCOUNTER — Other Ambulatory Visit: Payer: Self-pay

## 2020-05-04 VITALS — Ht 66.0 in | Wt 192.0 lb

## 2020-05-04 DIAGNOSIS — G4733 Obstructive sleep apnea (adult) (pediatric): Secondary | ICD-10-CM | POA: Diagnosis present

## 2020-05-04 DIAGNOSIS — G4700 Insomnia, unspecified: Secondary | ICD-10-CM | POA: Insufficient documentation

## 2020-05-29 DIAGNOSIS — G4700 Insomnia, unspecified: Secondary | ICD-10-CM | POA: Diagnosis not present

## 2020-05-29 DIAGNOSIS — G4733 Obstructive sleep apnea (adult) (pediatric): Secondary | ICD-10-CM

## 2020-05-29 NOTE — Procedures (Signed)
Patient Name: Jack Vance, Jack Vance Date: 05/04/2020 Gender: Male D.O.B: 1941-01-15 Age (years): 79 Referring Provider: Kristopher Oppenheim Height (inches): 52 Interpreting Physician: Baird Lyons MD, ABSM Weight (lbs): 192 RPSGT: Jacolyn Reedy BMI: 31 MRN: [id number] Neck Size: 16.50  CLINICAL INFORMATION Sleep Study Type: Split Night CPAP Indication for sleep study: Excessive Daytime Sleepiness, Obesity, Snoring Epworth Sleepiness Score: 7  SLEEP STUDY TECHNIQUE As per the AASM Manual for the Scoring of Sleep and Associated Events v2.3 (April 2016) with a hypopnea requiring 4% desaturations.  The channels recorded and monitored were frontal, central and occipital EEG, electrooculogram (EOG), submentalis EMG (chin), nasal and oral airflow, thoracic and abdominal wall motion, anterior tibialis EMG, snore microphone, electrocardiogram, and pulse oximetry. Continuous positive airway pressure (CPAP) was initiated when the patient met split night criteria and was titrated according to treat sleep-disordered breathing.  MEDICATIONS Medications self-administered by patient taken the night of the study : TYLENOL, GABAPENTIN, ROPINIROLE HCL, VITAMIN B COMPLEX, DAILY MULTIPLE VITAMIN  RESPIRATORY PARAMETERS Diagnostic  Total AHI (/hr): 24.2 RDI (/hr): 47.4 OA Index (/hr): 0.5 CA Index (/hr): 0.0 REM AHI (/hr): N/A NREM AHI (/hr): 24.2 Supine AHI (/hr): 18.7 Non-supine AHI (/hr): 38.8 Min O2 Sat (%): 86.00 Mean O2 (%): 92.31 Time below 88% (min): 2.8   Titration  Optimal Pressure (cm): 12 AHI at Optimal Pressure (/hr): 16.3 Min O2 at Optimal Pressure (%): 90.0 Supine % at Optimal (%): 100 Sleep % at Optimal (%): 75   SLEEP ARCHITECTURE The recording time for the entire night was 363.8 minutes.  During a baseline period of 168.9 minutes, the patient slept for 124.0 minutes in REM and nonREM, yielding a sleep efficiency of 73.4%. Sleep onset after lights out was 11.8 minutes  with a REM latency of N/A minutes. The patient spent 27.42% of the night in stage N1 sleep, 72.58% in stage N2 sleep, 0.00% in stage N3 and 0% in REM.    During the titration period of 181.3 minutes, the patient slept for 111.0 minutes in REM and nonREM, yielding a sleep efficiency of 61.2%. Sleep onset after CPAP initiation was 16.4 minutes with a REM latency of 11.5 minutes. The patient spent 15.32% of the night in stage N1 sleep, 53.60% in stage N2 sleep, 0.00% in stage N3 and 31.1% in REM.  CARDIAC DATA The 2 lead EKG demonstrated sinus rhythm. The mean heart rate was 79.04 beats per minute. Other EKG findings include: None.  LEG MOVEMENT DATA The total Periodic Limb Movements of Sleep (PLMS) were 8. The PLMS index was 2.04 .  IMPRESSIONS - Moderate obstructive sleep apnea occurred during the diagnostic portion of the study(AHI = 24.2/hour). An optimal PAP pressure was selected for this patient ( 12 cm of water) - No significant central sleep apnea occurred during the diagnostic portion of the study (CAI = 0.0/hour). - Mild oxygen desaturation was noted during the diagnostic portion of the study (Min O2 = 86.00%).  Minimum O2 sat on CPAP 12 was 90%. - The patient snored with moderate snoring volume during the diagnostic portion of the study. - No cardiac abnormalities were noted during this study.- occasional PAC. - Clinically significant periodic limb movements did not occur during sleep. - Patient made 3 bathroom trips and reports up to 5/ night at home.  DIAGNOSIS - Obstructive Sleep Apnea (G47.33)  RECOMMENDATIONS - Trial of CPAP therapy on 12 cm H2O or autopap 5-15. - Patient used a Medium size Fisher[employer]Paykel Full Face Mask Simplus mask  and heated humidification. - Frequent bathroom trips can interfere with CPAP use and represent a sleep disturbance that may justify Urology evaluation. - Be careful with alcohol, sedatives and other CNS depressants that may worsen sleep apnea and  disrupt normal sleep architecture. - Sleep hygiene should be reviewed to assess factors that may improve sleep quality. - Weight management and regular exercise should be initiated or continued.  [Electronically signed] 05/29/2020 12:05 PM  Baird Lyons MD, [place], American Board of Sleep Medicine   NPI: [id number]                                     [place], [place] [facility]  ELECTRONICALLY SIGNED ON:  05/29/2020, 11:58 AM [place] PH: [phone]   FX: [phone] [place]

## 2020-09-23 ENCOUNTER — Inpatient Hospital Stay (HOSPITAL_COMMUNITY): Payer: No Typology Code available for payment source | Admitting: Anesthesiology

## 2020-09-23 ENCOUNTER — Inpatient Hospital Stay (HOSPITAL_COMMUNITY): Payer: No Typology Code available for payment source

## 2020-09-23 ENCOUNTER — Encounter (HOSPITAL_COMMUNITY): Payer: Self-pay

## 2020-09-23 ENCOUNTER — Emergency Department (HOSPITAL_COMMUNITY): Payer: No Typology Code available for payment source

## 2020-09-23 ENCOUNTER — Encounter (HOSPITAL_COMMUNITY)
Admission: EM | Disposition: A | Discharge status: Nursing Home (Includes ICF) | Payer: Self-pay | Source: Home / Self Care | Attending: Thoracic Surgery (Cardiothoracic Vascular Surgery)

## 2020-09-23 ENCOUNTER — Inpatient Hospital Stay (HOSPITAL_COMMUNITY)
Admission: EM | Admit: 2020-09-23 | Discharge: 2020-10-20 | DRG: 003 | Disposition: A | Discharge status: Other Acute Inpatient Hospital | Payer: No Typology Code available for payment source | Attending: Thoracic Surgery (Cardiothoracic Vascular Surgery) | Admitting: Thoracic Surgery (Cardiothoracic Vascular Surgery)

## 2020-09-23 ENCOUNTER — Inpatient Hospital Stay (HOSPITAL_COMMUNITY)
Admission: EM | Disposition: A | Discharge status: Nursing Home (Includes ICF) | Payer: Self-pay | Source: Home / Self Care | Attending: Thoracic Surgery (Cardiothoracic Vascular Surgery)

## 2020-09-23 ENCOUNTER — Other Ambulatory Visit: Payer: Self-pay

## 2020-09-23 DIAGNOSIS — I214 Non-ST elevation (NSTEMI) myocardial infarction: Secondary | ICD-10-CM | POA: Diagnosis not present

## 2020-09-23 DIAGNOSIS — N179 Acute kidney failure, unspecified: Secondary | ICD-10-CM | POA: Diagnosis not present

## 2020-09-23 DIAGNOSIS — D62 Acute posthemorrhagic anemia: Secondary | ICD-10-CM | POA: Diagnosis not present

## 2020-09-23 DIAGNOSIS — I110 Hypertensive heart disease with heart failure: Secondary | ICD-10-CM | POA: Diagnosis present

## 2020-09-23 DIAGNOSIS — Z09 Encounter for follow-up examination after completed treatment for conditions other than malignant neoplasm: Secondary | ICD-10-CM

## 2020-09-23 DIAGNOSIS — I081 Rheumatic disorders of both mitral and tricuspid valves: Secondary | ICD-10-CM | POA: Diagnosis present

## 2020-09-23 DIAGNOSIS — I509 Heart failure, unspecified: Secondary | ICD-10-CM | POA: Diagnosis not present

## 2020-09-23 DIAGNOSIS — I252 Old myocardial infarction: Secondary | ICD-10-CM

## 2020-09-23 DIAGNOSIS — E1165 Type 2 diabetes mellitus with hyperglycemia: Secondary | ICD-10-CM | POA: Diagnosis not present

## 2020-09-23 DIAGNOSIS — R509 Fever, unspecified: Secondary | ICD-10-CM

## 2020-09-23 DIAGNOSIS — Z6829 Body mass index (BMI) 29.0-29.9, adult: Secondary | ICD-10-CM

## 2020-09-23 DIAGNOSIS — R570 Cardiogenic shock: Secondary | ICD-10-CM | POA: Diagnosis present

## 2020-09-23 DIAGNOSIS — J45909 Unspecified asthma, uncomplicated: Secondary | ICD-10-CM | POA: Diagnosis present

## 2020-09-23 DIAGNOSIS — J9622 Acute and chronic respiratory failure with hypercapnia: Secondary | ICD-10-CM | POA: Diagnosis not present

## 2020-09-23 DIAGNOSIS — J9690 Respiratory failure, unspecified, unspecified whether with hypoxia or hypercapnia: Secondary | ICD-10-CM

## 2020-09-23 DIAGNOSIS — E11649 Type 2 diabetes mellitus with hypoglycemia without coma: Secondary | ICD-10-CM | POA: Diagnosis not present

## 2020-09-23 DIAGNOSIS — Z4659 Encounter for fitting and adjustment of other gastrointestinal appliance and device: Secondary | ICD-10-CM

## 2020-09-23 DIAGNOSIS — I4891 Unspecified atrial fibrillation: Secondary | ICD-10-CM | POA: Diagnosis not present

## 2020-09-23 DIAGNOSIS — Z9289 Personal history of other medical treatment: Secondary | ICD-10-CM

## 2020-09-23 DIAGNOSIS — J9621 Acute and chronic respiratory failure with hypoxia: Secondary | ICD-10-CM | POA: Diagnosis not present

## 2020-09-23 DIAGNOSIS — R0603 Acute respiratory distress: Secondary | ICD-10-CM

## 2020-09-23 DIAGNOSIS — Z9889 Other specified postprocedural states: Secondary | ICD-10-CM

## 2020-09-23 DIAGNOSIS — K567 Ileus, unspecified: Secondary | ICD-10-CM

## 2020-09-23 DIAGNOSIS — E876 Hypokalemia: Secondary | ICD-10-CM | POA: Diagnosis not present

## 2020-09-23 DIAGNOSIS — G2581 Restless legs syndrome: Secondary | ICD-10-CM | POA: Diagnosis present

## 2020-09-23 DIAGNOSIS — J690 Pneumonitis due to inhalation of food and vomit: Secondary | ICD-10-CM | POA: Diagnosis present

## 2020-09-23 DIAGNOSIS — J9601 Acute respiratory failure with hypoxia: Secondary | ICD-10-CM | POA: Diagnosis not present

## 2020-09-23 DIAGNOSIS — Z79899 Other long term (current) drug therapy: Secondary | ICD-10-CM

## 2020-09-23 DIAGNOSIS — K439 Ventral hernia without obstruction or gangrene: Secondary | ICD-10-CM | POA: Diagnosis present

## 2020-09-23 DIAGNOSIS — Z7951 Long term (current) use of inhaled steroids: Secondary | ICD-10-CM

## 2020-09-23 DIAGNOSIS — Z9911 Dependence on respirator [ventilator] status: Secondary | ICD-10-CM | POA: Diagnosis not present

## 2020-09-23 DIAGNOSIS — Z8249 Family history of ischemic heart disease and other diseases of the circulatory system: Secondary | ICD-10-CM

## 2020-09-23 DIAGNOSIS — I5021 Acute systolic (congestive) heart failure: Secondary | ICD-10-CM | POA: Diagnosis present

## 2020-09-23 DIAGNOSIS — I5082 Biventricular heart failure: Secondary | ICD-10-CM | POA: Diagnosis present

## 2020-09-23 DIAGNOSIS — E870 Hyperosmolality and hypernatremia: Secondary | ICD-10-CM | POA: Diagnosis not present

## 2020-09-23 DIAGNOSIS — J9611 Chronic respiratory failure with hypoxia: Secondary | ICD-10-CM | POA: Diagnosis not present

## 2020-09-23 DIAGNOSIS — J9811 Atelectasis: Secondary | ICD-10-CM | POA: Diagnosis present

## 2020-09-23 DIAGNOSIS — I441 Atrioventricular block, second degree: Secondary | ICD-10-CM | POA: Diagnosis present

## 2020-09-23 DIAGNOSIS — I5043 Acute on chronic combined systolic (congestive) and diastolic (congestive) heart failure: Secondary | ICD-10-CM | POA: Diagnosis not present

## 2020-09-23 DIAGNOSIS — R9431 Abnormal electrocardiogram [ECG] [EKG]: Secondary | ICD-10-CM

## 2020-09-23 DIAGNOSIS — I10 Essential (primary) hypertension: Secondary | ICD-10-CM | POA: Diagnosis not present

## 2020-09-23 DIAGNOSIS — Z930 Tracheostomy status: Secondary | ICD-10-CM | POA: Diagnosis not present

## 2020-09-23 DIAGNOSIS — L8990 Pressure ulcer of unspecified site, unspecified stage: Secondary | ICD-10-CM | POA: Insufficient documentation

## 2020-09-23 DIAGNOSIS — T17908A Unspecified foreign body in respiratory tract, part unspecified causing other injury, initial encounter: Secondary | ICD-10-CM

## 2020-09-23 DIAGNOSIS — J9503 Malfunction of tracheostomy stoma: Secondary | ICD-10-CM | POA: Diagnosis not present

## 2020-09-23 DIAGNOSIS — J939 Pneumothorax, unspecified: Secondary | ICD-10-CM

## 2020-09-23 DIAGNOSIS — D509 Iron deficiency anemia, unspecified: Secondary | ICD-10-CM | POA: Diagnosis present

## 2020-09-23 DIAGNOSIS — N170 Acute kidney failure with tubular necrosis: Secondary | ICD-10-CM | POA: Diagnosis not present

## 2020-09-23 DIAGNOSIS — T17800A Unspecified foreign body in other parts of respiratory tract causing asphyxiation, initial encounter: Secondary | ICD-10-CM | POA: Diagnosis not present

## 2020-09-23 DIAGNOSIS — I2510 Atherosclerotic heart disease of native coronary artery without angina pectoris: Secondary | ICD-10-CM | POA: Diagnosis present

## 2020-09-23 DIAGNOSIS — H9190 Unspecified hearing loss, unspecified ear: Secondary | ICD-10-CM | POA: Diagnosis present

## 2020-09-23 DIAGNOSIS — R140 Abdominal distension (gaseous): Secondary | ICD-10-CM

## 2020-09-23 DIAGNOSIS — I2729 Other secondary pulmonary hypertension: Secondary | ICD-10-CM | POA: Diagnosis not present

## 2020-09-23 DIAGNOSIS — Z20822 Contact with and (suspected) exposure to covid-19: Secondary | ICD-10-CM | POA: Diagnosis present

## 2020-09-23 DIAGNOSIS — F05 Delirium due to known physiological condition: Secondary | ICD-10-CM | POA: Diagnosis present

## 2020-09-23 DIAGNOSIS — E785 Hyperlipidemia, unspecified: Secondary | ICD-10-CM | POA: Diagnosis present

## 2020-09-23 DIAGNOSIS — Z781 Physical restraint status: Secondary | ICD-10-CM

## 2020-09-23 DIAGNOSIS — I25110 Atherosclerotic heart disease of native coronary artery with unstable angina pectoris: Secondary | ICD-10-CM

## 2020-09-23 DIAGNOSIS — G9341 Metabolic encephalopathy: Secondary | ICD-10-CM | POA: Diagnosis not present

## 2020-09-23 DIAGNOSIS — Z951 Presence of aortocoronary bypass graft: Secondary | ICD-10-CM | POA: Diagnosis not present

## 2020-09-23 DIAGNOSIS — R578 Other shock: Secondary | ICD-10-CM

## 2020-09-23 DIAGNOSIS — R079 Chest pain, unspecified: Secondary | ICD-10-CM | POA: Diagnosis not present

## 2020-09-23 DIAGNOSIS — I255 Ischemic cardiomyopathy: Secondary | ICD-10-CM | POA: Diagnosis present

## 2020-09-23 DIAGNOSIS — I219 Acute myocardial infarction, unspecified: Secondary | ICD-10-CM | POA: Diagnosis present

## 2020-09-23 DIAGNOSIS — G9349 Other encephalopathy: Secondary | ICD-10-CM | POA: Diagnosis not present

## 2020-09-23 DIAGNOSIS — Z978 Presence of other specified devices: Secondary | ICD-10-CM

## 2020-09-23 DIAGNOSIS — D696 Thrombocytopenia, unspecified: Secondary | ICD-10-CM | POA: Diagnosis present

## 2020-09-23 DIAGNOSIS — E8809 Other disorders of plasma-protein metabolism, not elsewhere classified: Secondary | ICD-10-CM | POA: Diagnosis present

## 2020-09-23 DIAGNOSIS — T17908D Unspecified foreign body in respiratory tract, part unspecified causing other injury, subsequent encounter: Secondary | ICD-10-CM | POA: Diagnosis not present

## 2020-09-23 DIAGNOSIS — M1990 Unspecified osteoarthritis, unspecified site: Secondary | ICD-10-CM | POA: Diagnosis present

## 2020-09-23 DIAGNOSIS — M5136 Other intervertebral disc degeneration, lumbar region: Secondary | ICD-10-CM | POA: Diagnosis present

## 2020-09-23 DIAGNOSIS — I2101 ST elevation (STEMI) myocardial infarction involving left main coronary artery: Secondary | ICD-10-CM | POA: Diagnosis not present

## 2020-09-23 DIAGNOSIS — I314 Cardiac tamponade: Secondary | ICD-10-CM | POA: Diagnosis not present

## 2020-09-23 DIAGNOSIS — M7989 Other specified soft tissue disorders: Secondary | ICD-10-CM | POA: Diagnosis not present

## 2020-09-23 DIAGNOSIS — D638 Anemia in other chronic diseases classified elsewhere: Secondary | ICD-10-CM | POA: Diagnosis present

## 2020-09-23 DIAGNOSIS — G9340 Encephalopathy, unspecified: Secondary | ICD-10-CM | POA: Diagnosis not present

## 2020-09-23 HISTORY — PX: TEE WITHOUT CARDIOVERSION: SHX5443

## 2020-09-23 HISTORY — PX: IABP INSERTION: CATH118242

## 2020-09-23 HISTORY — PX: CORONARY ARTERY BYPASS GRAFT: SHX141

## 2020-09-23 HISTORY — PX: RIGHT/LEFT HEART CATH AND CORONARY ANGIOGRAPHY: CATH118266

## 2020-09-23 LAB — FERRITIN: Ferritin: 51 ng/mL (ref 24–336)

## 2020-09-23 LAB — POCT I-STAT 7, (LYTES, BLD GAS, ICA,H+H)
Acid-Base Excess: 2 mmol/L (ref 0.0–2.0)
Acid-Base Excess: 0 mmol/L (ref 0.0–2.0)
Acid-Base Excess: 3 mmol/L — ABNORMAL HIGH (ref 0.0–2.0)
Acid-Base Excess: 2 mmol/L (ref 0.0–2.0)
Acid-Base Excess: 1 mmol/L (ref 0.0–2.0)
Acid-base deficit: 2 mmol/L (ref 0.0–2.0)
Bicarbonate: 27.7 mmol/L (ref 20.0–28.0)
Bicarbonate: 26 mmol/L (ref 20.0–28.0)
Bicarbonate: 27.8 mmol/L (ref 20.0–28.0)
Bicarbonate: 26.4 mmol/L (ref 20.0–28.0)
Bicarbonate: 26 mmol/L (ref 20.0–28.0)
Bicarbonate: 23.8 mmol/L (ref 20.0–28.0)
Calcium, Ion: 1.15 mmol/L (ref 1.15–1.40)
Calcium, Ion: 1.15 mmol/L (ref 1.15–1.40)
Calcium, Ion: 1.01 mmol/L — ABNORMAL LOW (ref 1.15–1.40)
Calcium, Ion: 0.97 mmol/L — ABNORMAL LOW (ref 1.15–1.40)
Calcium, Ion: 0.95 mmol/L — ABNORMAL LOW (ref 1.15–1.40)
Calcium, Ion: 1.2 mmol/L (ref 1.15–1.40)
HCT: 21 % — ABNORMAL LOW (ref 39.0–52.0)
HCT: 21 % — ABNORMAL LOW (ref 39.0–52.0)
HCT: 17 % — ABNORMAL LOW (ref 39.0–52.0)
HCT: 20 % — ABNORMAL LOW (ref 39.0–52.0)
HCT: 23 % — ABNORMAL LOW (ref 39.0–52.0)
HCT: 27 % — ABNORMAL LOW (ref 39.0–52.0)
Hemoglobin: 7.1 g/dL — ABNORMAL LOW (ref 13.0–17.0)
Hemoglobin: 7.1 g/dL — ABNORMAL LOW (ref 13.0–17.0)
Hemoglobin: 5.8 g/dL — CL (ref 13.0–17.0)
Hemoglobin: 6.8 g/dL — CL (ref 13.0–17.0)
Hemoglobin: 7.8 g/dL — ABNORMAL LOW (ref 13.0–17.0)
Hemoglobin: 9.2 g/dL — ABNORMAL LOW (ref 13.0–17.0)
O2 Saturation: 100 %
O2 Saturation: 98 %
O2 Saturation: 100 %
O2 Saturation: 100 %
O2 Saturation: 100 %
O2 Saturation: 99 %
Potassium: 3.8 mmol/L (ref 3.5–5.1)
Potassium: 3.5 mmol/L (ref 3.5–5.1)
Potassium: 3.8 mmol/L (ref 3.5–5.1)
Potassium: 3.7 mmol/L (ref 3.5–5.1)
Potassium: 4.1 mmol/L (ref 3.5–5.1)
Potassium: 3.2 mmol/L — ABNORMAL LOW (ref 3.5–5.1)
Sodium: 134 mmol/L — ABNORMAL LOW (ref 135–145)
Sodium: 134 mmol/L — ABNORMAL LOW (ref 135–145)
Sodium: 135 mmol/L (ref 135–145)
Sodium: 133 mmol/L — ABNORMAL LOW (ref 135–145)
Sodium: 136 mmol/L (ref 135–145)
Sodium: 137 mmol/L (ref 135–145)
TCO2: 29 mmol/L (ref 22–32)
TCO2: 27 mmol/L (ref 22–32)
TCO2: 29 mmol/L (ref 22–32)
TCO2: 28 mmol/L (ref 22–32)
TCO2: 27 mmol/L (ref 22–32)
TCO2: 25 mmol/L (ref 22–32)
pCO2 arterial: 47.5 mmHg (ref 32.0–48.0)
pCO2 arterial: 47 mmHg (ref 32.0–48.0)
pCO2 arterial: 44.1 mmHg (ref 32.0–48.0)
pCO2 arterial: 37.6 mmHg (ref 32.0–48.0)
pCO2 arterial: 41.7 mmHg (ref 32.0–48.0)
pCO2 arterial: 45.5 mmHg (ref 32.0–48.0)
pH, Arterial: 7.374 (ref 7.350–7.450)
pH, Arterial: 7.351 (ref 7.350–7.450)
pH, Arterial: 7.408 (ref 7.350–7.450)
pH, Arterial: 7.454 — ABNORMAL HIGH (ref 7.350–7.450)
pH, Arterial: 7.402 (ref 7.350–7.450)
pH, Arterial: 7.327 — ABNORMAL LOW (ref 7.350–7.450)
pO2, Arterial: 474 mmHg — ABNORMAL HIGH (ref 83.0–108.0)
pO2, Arterial: 117 mmHg — ABNORMAL HIGH (ref 83.0–108.0)
pO2, Arterial: 377 mmHg — ABNORMAL HIGH (ref 83.0–108.0)
pO2, Arterial: 323 mmHg — ABNORMAL HIGH (ref 83.0–108.0)
pO2, Arterial: 295 mmHg — ABNORMAL HIGH (ref 83.0–108.0)
pO2, Arterial: 145 mmHg — ABNORMAL HIGH (ref 83.0–108.0)

## 2020-09-23 LAB — BLOOD GAS, ARTERIAL
Acid-base deficit: 1.8 mmol/L (ref 0.0–2.0)
Bicarbonate: 23.2 mmol/L (ref 20.0–28.0)
Delivery systems: POSITIVE
Drawn by: 23532
Expiratory PAP: 5
FIO2: 60
Inspiratory PAP: 14
Mode: POSITIVE
O2 Saturation: 24.6 %
Patient temperature: 98.6
pCO2 arterial: 43.9 mmHg (ref 32.0–48.0)
pH, Arterial: 7.343 — ABNORMAL LOW (ref 7.350–7.450)
pO2, Arterial: <31 mmHg — CL (ref 83.0–108.0)

## 2020-09-23 LAB — POCT I-STAT, CHEM 8
BUN: 30 mg/dL — ABNORMAL HIGH (ref 8–23)
BUN: 30 mg/dL — ABNORMAL HIGH (ref 8–23)
BUN: 29 mg/dL — ABNORMAL HIGH (ref 8–23)
BUN: 28 mg/dL — ABNORMAL HIGH (ref 8–23)
BUN: 30 mg/dL — ABNORMAL HIGH (ref 8–23)
BUN: 32 mg/dL — ABNORMAL HIGH (ref 8–23)
Calcium, Ion: 1.16 mmol/L (ref 1.15–1.40)
Calcium, Ion: 1.14 mmol/L — ABNORMAL LOW (ref 1.15–1.40)
Calcium, Ion: 0.95 mmol/L — ABNORMAL LOW (ref 1.15–1.40)
Calcium, Ion: 0.9 mmol/L — ABNORMAL LOW (ref 1.15–1.40)
Calcium, Ion: 0.95 mmol/L — ABNORMAL LOW (ref 1.15–1.40)
Calcium, Ion: 1.2 mmol/L (ref 1.15–1.40)
Chloride: 98 mmol/L (ref 98–111)
Chloride: 97 mmol/L — ABNORMAL LOW (ref 98–111)
Chloride: 95 mmol/L — ABNORMAL LOW (ref 98–111)
Chloride: 97 mmol/L — ABNORMAL LOW (ref 98–111)
Chloride: 100 mmol/L (ref 98–111)
Chloride: 98 mmol/L (ref 98–111)
Creatinine, Ser: 1.1 mg/dL (ref 0.61–1.24)
Creatinine, Ser: 1.1 mg/dL (ref 0.61–1.24)
Creatinine, Ser: 1 mg/dL (ref 0.61–1.24)
Creatinine, Ser: 1 mg/dL (ref 0.61–1.24)
Creatinine, Ser: 1 mg/dL (ref 0.61–1.24)
Creatinine, Ser: 1 mg/dL (ref 0.61–1.24)
Glucose, Bld: 167 mg/dL — ABNORMAL HIGH (ref 70–99)
Glucose, Bld: 174 mg/dL — ABNORMAL HIGH (ref 70–99)
Glucose, Bld: 173 mg/dL — ABNORMAL HIGH (ref 70–99)
Glucose, Bld: 154 mg/dL — ABNORMAL HIGH (ref 70–99)
Glucose, Bld: 154 mg/dL — ABNORMAL HIGH (ref 70–99)
Glucose, Bld: 164 mg/dL — ABNORMAL HIGH (ref 70–99)
HCT: 22 % — ABNORMAL LOW (ref 39.0–52.0)
HCT: 18 % — ABNORMAL LOW (ref 39.0–52.0)
HCT: 23 % — ABNORMAL LOW (ref 39.0–52.0)
HCT: 25 % — ABNORMAL LOW (ref 39.0–52.0)
HCT: 24 % — ABNORMAL LOW (ref 39.0–52.0)
HCT: 29 % — ABNORMAL LOW (ref 39.0–52.0)
Hemoglobin: 7.5 g/dL — ABNORMAL LOW (ref 13.0–17.0)
Hemoglobin: 6.1 g/dL — CL (ref 13.0–17.0)
Hemoglobin: 7.8 g/dL — ABNORMAL LOW (ref 13.0–17.0)
Hemoglobin: 8.5 g/dL — ABNORMAL LOW (ref 13.0–17.0)
Hemoglobin: 8.2 g/dL — ABNORMAL LOW (ref 13.0–17.0)
Hemoglobin: 9.9 g/dL — ABNORMAL LOW (ref 13.0–17.0)
Potassium: 3.5 mmol/L (ref 3.5–5.1)
Potassium: 3.4 mmol/L — ABNORMAL LOW (ref 3.5–5.1)
Potassium: 4.1 mmol/L (ref 3.5–5.1)
Potassium: 3.9 mmol/L (ref 3.5–5.1)
Potassium: 3.2 mmol/L — ABNORMAL LOW (ref 3.5–5.1)
Potassium: 4.2 mmol/L (ref 3.5–5.1)
Sodium: 135 mmol/L (ref 135–145)
Sodium: 135 mmol/L (ref 135–145)
Sodium: 133 mmol/L — ABNORMAL LOW (ref 135–145)
Sodium: 133 mmol/L — ABNORMAL LOW (ref 135–145)
Sodium: 135 mmol/L (ref 135–145)
Sodium: 137 mmol/L (ref 135–145)
TCO2: 25 mmol/L (ref 22–32)
TCO2: 26 mmol/L (ref 22–32)
TCO2: 25 mmol/L (ref 22–32)
TCO2: 25 mmol/L (ref 22–32)
TCO2: 24 mmol/L (ref 22–32)
TCO2: 26 mmol/L (ref 22–32)

## 2020-09-23 LAB — CBC WITH DIFFERENTIAL/PLATELET
Abs Immature Granulocytes: 0.07 10*3/uL (ref 0.00–0.07)
Basophils Absolute: 0 10*3/uL (ref 0.0–0.1)
Basophils Relative: 0 %
Eosinophils Absolute: 0 10*3/uL (ref 0.0–0.5)
Eosinophils Relative: 0 %
HCT: 24.7 % — ABNORMAL LOW (ref 39.0–52.0)
Hemoglobin: 7.5 g/dL — ABNORMAL LOW (ref 13.0–17.0)
Immature Granulocytes: 1 %
Lymphocytes Relative: 4 %
Lymphs Abs: 0.6 10*3/uL — ABNORMAL LOW (ref 0.7–4.0)
MCH: 24.5 pg — ABNORMAL LOW (ref 26.0–34.0)
MCHC: 30.4 g/dL (ref 30.0–36.0)
MCV: 80.7 fL (ref 80.0–100.0)
Monocytes Absolute: 1.3 10*3/uL — ABNORMAL HIGH (ref 0.1–1.0)
Monocytes Relative: 9 %
Neutro Abs: 12.5 10*3/uL — ABNORMAL HIGH (ref 1.7–7.7)
Neutrophils Relative %: 86 %
Platelets: 269 10*3/uL (ref 150–400)
RBC: 3.06 MIL/uL — ABNORMAL LOW (ref 4.22–5.81)
RDW: 16.5 % — ABNORMAL HIGH (ref 11.5–15.5)
WBC: 14.5 10*3/uL — ABNORMAL HIGH (ref 4.0–10.5)
nRBC: 0 % (ref 0.0–0.2)

## 2020-09-23 LAB — PREPARE RBC (CROSSMATCH)

## 2020-09-23 LAB — RESP PANEL BY RT-PCR (FLU A&B, COVID) ARPGX2
Influenza A by PCR: NEGATIVE
Influenza B by PCR: NEGATIVE
SARS Coronavirus 2 by RT PCR: NEGATIVE

## 2020-09-23 LAB — POCT I-STAT EG7
Acid-Base Excess: 0 mmol/L (ref 0.0–2.0)
Acid-Base Excess: 1 mmol/L (ref 0.0–2.0)
Acid-base deficit: 1 mmol/L (ref 0.0–2.0)
Bicarbonate: 25.3 mmol/L (ref 20.0–28.0)
Bicarbonate: 25.9 mmol/L (ref 20.0–28.0)
Bicarbonate: 26.8 mmol/L (ref 20.0–28.0)
Calcium, Ion: 1.17 mmol/L (ref 1.15–1.40)
Calcium, Ion: 1.18 mmol/L (ref 1.15–1.40)
Calcium, Ion: 1.04 mmol/L — ABNORMAL LOW (ref 1.15–1.40)
HCT: 22 % — ABNORMAL LOW (ref 39.0–52.0)
HCT: 22 % — ABNORMAL LOW (ref 39.0–52.0)
HCT: 18 % — ABNORMAL LOW (ref 39.0–52.0)
Hemoglobin: 7.5 g/dL — ABNORMAL LOW (ref 13.0–17.0)
Hemoglobin: 7.5 g/dL — ABNORMAL LOW (ref 13.0–17.0)
Hemoglobin: 6.1 g/dL — CL (ref 13.0–17.0)
O2 Saturation: 68 %
O2 Saturation: 69 %
O2 Saturation: 67 %
Potassium: 3.8 mmol/L (ref 3.5–5.1)
Potassium: 3.8 mmol/L (ref 3.5–5.1)
Potassium: 3.7 mmol/L (ref 3.5–5.1)
Sodium: 134 mmol/L — ABNORMAL LOW (ref 135–145)
Sodium: 134 mmol/L — ABNORMAL LOW (ref 135–145)
Sodium: 135 mmol/L (ref 135–145)
TCO2: 27 mmol/L (ref 22–32)
TCO2: 27 mmol/L (ref 22–32)
TCO2: 28 mmol/L (ref 22–32)
pCO2, Ven: 50 mmHg (ref 44.0–60.0)
pCO2, Ven: 51.3 mmHg (ref 44.0–60.0)
pCO2, Ven: 47.4 mmHg (ref 44.0–60.0)
pH, Ven: 7.312 (ref 7.250–7.430)
pH, Ven: 7.313 (ref 7.250–7.430)
pH, Ven: 7.36 (ref 7.250–7.430)
pO2, Ven: 39 mmHg (ref 32.0–45.0)
pO2, Ven: 39 mmHg (ref 32.0–45.0)
pO2, Ven: 37 mmHg (ref 32.0–45.0)

## 2020-09-23 LAB — CK TOTAL AND CKMB (NOT AT ARMC)
CK, MB: 65.8 ng/mL — ABNORMAL HIGH (ref 0.5–5.0)
Relative Index: 14.5 — ABNORMAL HIGH (ref 0.0–2.5)
Total CK: 455 U/L — ABNORMAL HIGH (ref 49–397)

## 2020-09-23 LAB — GLUCOSE, CAPILLARY
Glucose-Capillary: 118 mg/dL — ABNORMAL HIGH (ref 70–99)
Glucose-Capillary: 112 mg/dL — ABNORMAL HIGH (ref 70–99)

## 2020-09-23 LAB — TYPE AND SCREEN
ABO/RH(D): A POS
Antibody Screen: NEGATIVE

## 2020-09-23 LAB — ECHOCARDIOGRAM COMPLETE
AR max vel: 1.7 cm2
AV Area VTI: 1.57 cm2
AV Area mean vel: 1.6 cm2
AV Mean grad: 5 mmHg
AV Peak grad: 10.8 mmHg
Ao pk vel: 1.64 m/s
Area-P 1/2: 5.66 cm2
Height: 66 in
S' Lateral: 4 cm
Weight: 2928 oz

## 2020-09-23 LAB — PROTIME-INR
INR: 1.2 (ref 0.8–1.2)
INR: 1.5 — ABNORMAL HIGH (ref 0.8–1.2)
Prothrombin Time: 14.7 seconds (ref 11.4–15.2)
Prothrombin Time: 18.1 seconds — ABNORMAL HIGH (ref 11.4–15.2)

## 2020-09-23 LAB — COMPREHENSIVE METABOLIC PANEL
ALT: 94 U/L — ABNORMAL HIGH (ref 0–44)
AST: 131 U/L — ABNORMAL HIGH (ref 15–41)
Albumin: 3.5 g/dL (ref 3.5–5.0)
Alkaline Phosphatase: 77 U/L (ref 38–126)
Anion gap: 16 — ABNORMAL HIGH (ref 5–15)
BUN: 36 mg/dL — ABNORMAL HIGH (ref 8–23)
CO2: 21 mmol/L — ABNORMAL LOW (ref 22–32)
Calcium: 9.1 mg/dL (ref 8.9–10.3)
Chloride: 100 mmol/L (ref 98–111)
Creatinine, Ser: 1.23 mg/dL (ref 0.61–1.24)
GFR, Estimated: >60 mL/min (ref >60)
Glucose, Bld: 140 mg/dL — ABNORMAL HIGH (ref 70–99)
Potassium: 4.1 mmol/L (ref 3.5–5.1)
Sodium: 137 mmol/L (ref 135–145)
Total Bilirubin: 0.9 mg/dL (ref 0.3–1.2)
Total Protein: 7.2 g/dL (ref 6.5–8.1)

## 2020-09-23 LAB — HEMOGLOBIN AND HEMATOCRIT, BLOOD
HCT: 31.1 % — ABNORMAL LOW (ref 39.0–52.0)
HCT: 23.8 % — ABNORMAL LOW (ref 39.0–52.0)
Hemoglobin: 9.3 g/dL — ABNORMAL LOW (ref 13.0–17.0)
Hemoglobin: 7.7 g/dL — ABNORMAL LOW (ref 13.0–17.0)

## 2020-09-23 LAB — CBC
HCT: 32.6 % — ABNORMAL LOW (ref 39.0–52.0)
Hemoglobin: 10.5 g/dL — ABNORMAL LOW (ref 13.0–17.0)
MCH: 26.9 pg (ref 26.0–34.0)
MCHC: 32.2 g/dL (ref 30.0–36.0)
MCV: 83.6 fL (ref 80.0–100.0)
Platelets: 153 10*3/uL (ref 150–400)
RBC: 3.9 MIL/uL — ABNORMAL LOW (ref 4.22–5.81)
RDW: 16.6 % — ABNORMAL HIGH (ref 11.5–15.5)
WBC: 18.9 10*3/uL — ABNORMAL HIGH (ref 4.0–10.5)
nRBC: 0.6 % — ABNORMAL HIGH (ref 0.0–0.2)

## 2020-09-23 LAB — IRON AND TIBC
Iron: 18 ug/dL — ABNORMAL LOW (ref 45–182)
Saturation Ratios: 4 % — ABNORMAL LOW (ref 17.9–39.5)
TIBC: 407 ug/dL (ref 250–450)
UIBC: 389 ug/dL

## 2020-09-23 LAB — LACTIC ACID, PLASMA
Lactic Acid, Venous: 2.5 mmol/L (ref 0.5–1.9)
Lactic Acid, Venous: 2.4 mmol/L (ref 0.5–1.9)

## 2020-09-23 LAB — TROPONIN I (HIGH SENSITIVITY)
Troponin I (High Sensitivity): 7318 ng/L (ref <18)
Troponin I (High Sensitivity): 7464 ng/L (ref <18)
Troponin I (High Sensitivity): 5711 ng/L (ref <18)

## 2020-09-23 LAB — VITAMIN B12: Vitamin B-12: 818 pg/mL (ref 180–914)

## 2020-09-23 LAB — PROCALCITONIN: Procalcitonin: <0.1 ng/mL

## 2020-09-23 LAB — RETICULOCYTES
Immature Retic Fract: 30.8 % — ABNORMAL HIGH (ref 2.3–15.9)
RBC.: 3.88 MIL/uL — ABNORMAL LOW (ref 4.22–5.81)
Retic Count, Absolute: 71.8 10*3/uL (ref 19.0–186.0)
Retic Ct Pct: 1.9 % (ref 0.4–3.1)

## 2020-09-23 LAB — BRAIN NATRIURETIC PEPTIDE: B Natriuretic Peptide: 1191.8 pg/mL — ABNORMAL HIGH (ref 0.0–100.0)

## 2020-09-23 LAB — FOLATE: Folate: 36.8 ng/mL (ref >5.9)

## 2020-09-23 LAB — APTT: aPTT: 32 seconds (ref 24–36)

## 2020-09-23 LAB — PLATELET COUNT: Platelets: 159 10*3/uL (ref 150–400)

## 2020-09-23 LAB — ABO/RH: ABO/RH(D): A POS

## 2020-09-23 LAB — POCT ACTIVATED CLOTTING TIME: Activated Clotting Time: 154 seconds

## 2020-09-23 SURGERY — RIGHT/LEFT HEART CATH AND CORONARY ANGIOGRAPHY
Anesthesia: LOCAL

## 2020-09-23 SURGERY — CORONARY ARTERY BYPASS GRAFTING (CABG)
Anesthesia: General | Site: Chest

## 2020-09-23 MED ORDER — ONDANSETRON HCL 4 MG/2ML IJ SOLN: 4.0000 mg | Freq: Four times a day (QID) | INTRAMUSCULAR | Status: DC | PRN

## 2020-09-23 MED ORDER — IPRATROPIUM-ALBUTEROL 0.5-2.5 (3) MG/3ML IN SOLN
3.0000 mL | Freq: Once | RESPIRATORY_TRACT | Status: AC
  Administered 2020-09-23: 3 mL via RESPIRATORY_TRACT
  Filled 2020-09-23: qty 3

## 2020-09-23 MED ORDER — LACTATED RINGERS IV SOLN: INTRAVENOUS | Status: DC

## 2020-09-23 MED ORDER — ASPIRIN 325 MG PO TABS
325.0000 mg | ORAL_TABLET | Freq: Once | ORAL | Status: AC
  Administered 2020-09-23: 325 mg via ORAL
  Filled 2020-09-23: qty 1

## 2020-09-23 MED ORDER — SUCCINYLCHOLINE CHLORIDE 200 MG/10ML IV SOSY
PREFILLED_SYRINGE | INTRAVENOUS | Status: AC
  Administered 2020-09-23: 120 mg via INTRAVENOUS
  Filled 2020-09-23: qty 10

## 2020-09-23 MED ORDER — FENTANYL CITRATE (PF) 100 MCG/2ML IJ SOLN: 50.0000 ug | INTRAMUSCULAR | Status: DC | PRN

## 2020-09-23 MED ORDER — NITROGLYCERIN IN D5W 200-5 MCG/ML-% IV SOLN
0.0000 ug/min | INTRAVENOUS | Status: DC
  Administered 2020-09-23: 5 ug/min via INTRAVENOUS
  Filled 2020-09-23: qty 250

## 2020-09-23 MED ORDER — FENTANYL 2500MCG IN NS 250ML (10MCG/ML) PREMIX INFUSION
25.0000 ug/h | INTRAVENOUS | Status: DC
  Administered 2020-09-23: 100 ug/h via INTRAVENOUS
  Filled 2020-09-23: qty 250

## 2020-09-23 MED ORDER — METOPROLOL TARTRATE 12.5 MG HALF TABLET: 12.5000 mg | ORAL_TABLET | Freq: Once | ORAL | Status: DC

## 2020-09-23 MED ORDER — CHLORHEXIDINE GLUCONATE 0.12 % MT SOLN: 15.0000 mL | OROMUCOSAL | Status: DC

## 2020-09-23 MED ORDER — POTASSIUM CHLORIDE 10 MEQ/50ML IV SOLN
10.0000 meq | INTRAVENOUS | Status: AC
  Administered 2020-09-23 (×3): 10 meq via INTRAVENOUS

## 2020-09-23 MED ORDER — FENTANYL CITRATE (PF) 100 MCG/2ML IJ SOLN
100.0000 ug | Freq: Once | INTRAMUSCULAR | Status: AC
  Administered 2020-09-23: 100 ug via INTRAVENOUS

## 2020-09-23 MED ORDER — DEXMEDETOMIDINE HCL IN NACL 400 MCG/100ML IV SOLN
0.0000 ug/kg/h | INTRAVENOUS | Status: DC
  Administered 2020-09-23: 0.07 ug/kg/h via INTRAVENOUS
  Administered 2020-09-23 – 2020-09-29 (×21): 0.7 ug/kg/h via INTRAVENOUS
  Administered 2020-09-30: 0.3 ug/kg/h via INTRAVENOUS
  Administered 2020-09-30: 0.7 ug/kg/h via INTRAVENOUS
  Administered 2020-09-30: 0.1 ug/kg/h via INTRAVENOUS
  Administered 2020-10-01: 0.3 ug/kg/h via INTRAVENOUS
  Administered 2020-10-03: 0.5 ug/kg/h via INTRAVENOUS
  Administered 2020-10-04 – 2020-10-06 (×8): 0.7 ug/kg/h via INTRAVENOUS
  Administered 2020-10-06: 0.3 ug/kg/h via INTRAVENOUS
  Administered 2020-10-06 – 2020-10-07 (×3): 0.7 ug/kg/h via INTRAVENOUS
  Administered 2020-10-07: 0.4 ug/kg/h via INTRAVENOUS
  Administered 2020-10-07: 0.7 ug/kg/h via INTRAVENOUS
  Administered 2020-10-08: 0.6 ug/kg/h via INTRAVENOUS
  Administered 2020-10-08: 0.4 ug/kg/h via INTRAVENOUS
  Administered 2020-10-08: 0.6 ug/kg/h via INTRAVENOUS
  Administered 2020-10-09 (×2): 0.5 ug/kg/h via INTRAVENOUS
  Administered 2020-10-09 – 2020-10-10 (×4): 0.7 ug/kg/h via INTRAVENOUS
  Filled 2020-09-23 (×50): qty 100

## 2020-09-23 MED ORDER — PROTAMINE SULFATE 10 MG/ML IV SOLN
INTRAVENOUS | Status: AC
  Filled 2020-09-23: qty 25

## 2020-09-23 MED ORDER — TRANEXAMIC ACID (OHS) PUMP PRIME SOLUTION
2.0000 mg/kg | INTRAVENOUS | Status: DC
  Filled 2020-09-23: qty 1.66

## 2020-09-23 MED ORDER — ROCURONIUM BROMIDE 10 MG/ML (PF) SYRINGE
PREFILLED_SYRINGE | INTRAVENOUS | Status: AC
  Filled 2020-09-23: qty 10

## 2020-09-23 MED ORDER — FENTANYL CITRATE (PF) 100 MCG/2ML IJ SOLN
100.0000 ug | Freq: Once | INTRAMUSCULAR | Status: AC
Start: 2020-09-23 — End: 2020-09-23
  Administered 2020-09-23: 100 ug via INTRAVENOUS

## 2020-09-23 MED ORDER — MILRINONE LACTATE IN DEXTROSE 20-5 MG/100ML-% IV SOLN
0.3000 ug/kg/min | INTRAVENOUS | Status: DC
  Filled 2020-09-23: qty 100

## 2020-09-23 MED ORDER — LIDOCAINE HCL (PF) 1 % IJ SOLN
INTRAMUSCULAR | Status: DC | PRN
  Administered 2020-09-23: 30 mL

## 2020-09-23 MED ORDER — METOPROLOL TARTRATE 5 MG/5ML IV SOLN
2.5000 mg | INTRAVENOUS | Status: DC | PRN
  Administered 2020-09-30 – 2020-10-01 (×4): 5 mg via INTRAVENOUS
  Filled 2020-09-23 (×4): qty 5

## 2020-09-23 MED ORDER — FENTANYL BOLUS VIA INFUSION
50.0000 ug | Freq: Once | INTRAVENOUS | Status: AC
  Administered 2020-09-23 (×2): 50 ug via INTRAVENOUS
  Filled 2020-09-23: qty 50

## 2020-09-23 MED ORDER — DOCUSATE SODIUM 100 MG PO CAPS
200.0000 mg | ORAL_CAPSULE | Freq: Every day | ORAL | Status: DC
  Administered 2020-09-25: 200 mg via ORAL
  Filled 2020-09-23: qty 2

## 2020-09-23 MED ORDER — SUCCINYLCHOLINE CHLORIDE 20 MG/ML IJ SOLN: 120.0000 mg | Freq: Once | INTRAMUSCULAR | Status: DC

## 2020-09-23 MED ORDER — FENTANYL CITRATE (PF) 100 MCG/2ML IJ SOLN
50.0000 ug | INTRAMUSCULAR | Status: DC | PRN
  Administered 2020-09-23 – 2020-09-24 (×4): 100 ug via INTRAVENOUS
  Administered 2020-09-24: 50 ug via INTRAVENOUS
  Administered 2020-09-24: 100 ug via INTRAVENOUS
  Administered 2020-09-30: 50 ug via INTRAVENOUS
  Administered 2020-09-30: 75 ug via INTRAVENOUS
  Administered 2020-10-01: 50 ug via INTRAVENOUS
  Administered 2020-10-01 – 2020-10-08 (×20): 100 ug via INTRAVENOUS
  Administered 2020-10-09: 50 ug via INTRAVENOUS
  Administered 2020-10-09: 100 ug via INTRAVENOUS
  Administered 2020-10-09: 50 ug via INTRAVENOUS
  Administered 2020-10-10 – 2020-10-11 (×5): 100 ug via INTRAVENOUS
  Filled 2020-09-23 (×38): qty 2

## 2020-09-23 MED ORDER — FAMOTIDINE IN NACL 20-0.9 MG/50ML-% IV SOLN
20.0000 mg | Freq: Two times a day (BID) | INTRAVENOUS | Status: DC
  Administered 2020-09-23: 20 mg via INTRAVENOUS
  Filled 2020-09-23 (×2): qty 50

## 2020-09-23 MED ORDER — NITROGLYCERIN IN D5W 200-5 MCG/ML-% IV SOLN: 0.0000 ug/min | INTRAVENOUS | Status: DC

## 2020-09-23 MED ORDER — ATORVASTATIN CALCIUM 80 MG PO TABS
80.0000 mg | ORAL_TABLET | Freq: Every day | ORAL | Status: DC
  Administered 2020-09-24 – 2020-09-25 (×2): 80 mg via ORAL
  Filled 2020-09-23 (×2): qty 1

## 2020-09-23 MED ORDER — HEPARIN (PORCINE) IN NACL 1000-0.9 UT/500ML-% IV SOLN
INTRAVENOUS | Status: AC
  Filled 2020-09-23: qty 1000

## 2020-09-23 MED ORDER — MAGNESIUM SULFATE 4 GM/100ML IV SOLN
4.0000 g | Freq: Once | INTRAVENOUS | Status: AC
  Administered 2020-09-23: 4 g via INTRAVENOUS
  Filled 2020-09-23: qty 100

## 2020-09-23 MED ORDER — TEMAZEPAM 15 MG PO CAPS: 15.0000 mg | ORAL_CAPSULE | Freq: Once | ORAL | Status: DC | PRN

## 2020-09-23 MED ORDER — ACETAMINOPHEN 160 MG/5ML PO SOLN
1000.0000 mg | Freq: Four times a day (QID) | ORAL | Status: DC
  Administered 2020-09-24 – 2020-09-25 (×6): 1000 mg
  Filled 2020-09-23 (×6): qty 40.6

## 2020-09-23 MED ORDER — POTASSIUM CHLORIDE 2 MEQ/ML IV SOLN
80.0000 meq | INTRAVENOUS | Status: DC
  Filled 2020-09-23: qty 40

## 2020-09-23 MED ORDER — IOHEXOL 350 MG/ML SOLN
INTRAVENOUS | Status: DC | PRN
  Administered 2020-09-23: 20 mL via INTRA_ARTERIAL

## 2020-09-23 MED ORDER — BISACODYL 5 MG PO TBEC: 5.0000 mg | DELAYED_RELEASE_TABLET | Freq: Once | ORAL | Status: DC

## 2020-09-23 MED ORDER — PROPOFOL 500 MG/50ML IV EMUL
INTRAVENOUS | Status: AC
  Administered 2020-09-23: 5 ug/kg/min via INTRAVENOUS
  Filled 2020-09-23: qty 50

## 2020-09-23 MED ORDER — DOBUTAMINE IN D5W 4-5 MG/ML-% IV SOLN
2.5000 ug/kg/min | INTRAVENOUS | Status: AC
  Administered 2020-09-23: 2.5 ug/kg/min via INTRAVENOUS
  Filled 2020-09-23: qty 250

## 2020-09-23 MED ORDER — MANNITOL 20 % IV SOLN
Freq: Once | INTRAVENOUS | Status: DC
  Filled 2020-09-23: qty 13

## 2020-09-23 MED ORDER — SODIUM CHLORIDE 0.9 % IV SOLN: 250.0000 mL | INTRAVENOUS | Status: DC

## 2020-09-23 MED ORDER — PROTAMINE SULFATE 10 MG/ML IV SOLN
INTRAVENOUS | Status: DC | PRN
  Administered 2020-09-23: 30 mg via INTRAVENOUS
  Administered 2020-09-23: 230 mg via INTRAVENOUS

## 2020-09-23 MED ORDER — PROPOFOL 1000 MG/100ML IV EMUL: 5.0000 ug/kg/min | INTRAVENOUS | Status: DC

## 2020-09-23 MED ORDER — FENTANYL CITRATE (PF) 100 MCG/2ML IJ SOLN
INTRAMUSCULAR | Status: AC
  Filled 2020-09-23: qty 2

## 2020-09-23 MED ORDER — HEPARIN SODIUM (PORCINE) 1000 UNIT/ML IJ SOLN
INTRAMUSCULAR | Status: DC | PRN
  Administered 2020-09-23: 26000 [IU] via INTRAVENOUS

## 2020-09-23 MED ORDER — VANCOMYCIN HCL 1250 MG/250ML IV SOLN
1250.0000 mg | INTRAVENOUS | Status: AC
  Administered 2020-09-23: 1250 mg via INTRAVENOUS
  Filled 2020-09-23: qty 250

## 2020-09-23 MED ORDER — CEFAZOLIN SODIUM-DEXTROSE 2-4 GM/100ML-% IV SOLN
2.0000 g | INTRAVENOUS | Status: AC
  Administered 2020-09-23 (×2): 2 g via INTRAVENOUS
  Filled 2020-09-23: qty 100

## 2020-09-23 MED ORDER — PANTOPRAZOLE SODIUM 40 MG PO TBEC
40.0000 mg | DELAYED_RELEASE_TABLET | Freq: Every day | ORAL | Status: DC
  Administered 2020-09-25: 40 mg via ORAL
  Filled 2020-09-23 (×2): qty 1

## 2020-09-23 MED ORDER — NITROGLYCERIN 0.2 MG/ML ON CALL CATH LAB
INTRAVENOUS | Status: DC | PRN
  Administered 2020-09-23 (×4): 40 ug via INTRAVENOUS

## 2020-09-23 MED ORDER — METHYLPREDNISOLONE SODIUM SUCC 125 MG IJ SOLR
125.0000 mg | Freq: Once | INTRAMUSCULAR | Status: AC
  Administered 2020-09-23: 125 mg via INTRAVENOUS
  Filled 2020-09-23: qty 2

## 2020-09-23 MED ORDER — VANCOMYCIN HCL IN DEXTROSE 1-5 GM/200ML-% IV SOLN
1000.0000 mg | Freq: Once | INTRAVENOUS | Status: AC
  Administered 2020-09-24: 1000 mg via INTRAVENOUS
  Filled 2020-09-23: qty 200

## 2020-09-23 MED ORDER — MIDAZOLAM HCL 2 MG/2ML IJ SOLN
2.0000 mg | INTRAMUSCULAR | Status: DC | PRN
  Administered 2020-09-23 – 2020-10-10 (×29): 2 mg via INTRAVENOUS
  Filled 2020-09-23 (×29): qty 2

## 2020-09-23 MED ORDER — NICARDIPINE HCL IN NACL 20-0.86 MG/200ML-% IV SOLN
INTRAVENOUS | Status: DC | PRN
  Administered 2020-09-23: 5 mg/h via INTRAVENOUS

## 2020-09-23 MED ORDER — ACETAMINOPHEN 160 MG/5ML PO SOLN: 650.0000 mg | Freq: Once | ORAL | Status: DC

## 2020-09-23 MED ORDER — SODIUM CHLORIDE 0.9% FLUSH
3.0000 mL | Freq: Two times a day (BID) | INTRAVENOUS | Status: DC
  Administered 2020-09-23 – 2020-10-15 (×27): 3 mL via INTRAVENOUS

## 2020-09-23 MED ORDER — SODIUM CHLORIDE (PF) 0.9 % IJ SOLN
INTRAMUSCULAR | Status: AC
  Filled 2020-09-23: qty 30

## 2020-09-23 MED ORDER — ACETAMINOPHEN 500 MG PO TABS: 1000.0000 mg | ORAL_TABLET | Freq: Four times a day (QID) | ORAL | Status: DC

## 2020-09-23 MED ORDER — TRANEXAMIC ACID (OHS) BOLUS VIA INFUSION
15.0000 mg/kg | INTRAVENOUS | Status: AC
  Administered 2020-09-23: 1245 mg via INTRAVENOUS
  Filled 2020-09-23: qty 1245

## 2020-09-23 MED ORDER — SODIUM CHLORIDE 0.9 % IV SOLN
INTRAVENOUS | Status: DC
  Filled 2020-09-23: qty 30

## 2020-09-23 MED ORDER — LACTATED RINGERS IV SOLN: 500.0000 mL | Freq: Once | INTRAVENOUS | Status: DC | PRN

## 2020-09-23 MED ORDER — MIDAZOLAM HCL 2 MG/2ML IJ SOLN
INTRAMUSCULAR | Status: AC
  Filled 2020-09-23: qty 2

## 2020-09-23 MED ORDER — FUROSEMIDE 10 MG/ML IJ SOLN: 20.0000 mg | Freq: Once | INTRAMUSCULAR | Status: AC

## 2020-09-23 MED ORDER — SODIUM CHLORIDE 0.9% FLUSH: 10.0000 mL | INTRAVENOUS | Status: DC | PRN

## 2020-09-23 MED ORDER — PROPOFOL 10 MG/ML IV BOLUS
INTRAVENOUS | Status: AC
  Filled 2020-09-23: qty 20

## 2020-09-23 MED ORDER — SODIUM CHLORIDE 0.45 % IV SOLN: INTRAVENOUS | Status: DC | PRN

## 2020-09-23 MED ORDER — CEFAZOLIN SODIUM-DEXTROSE 2-4 GM/100ML-% IV SOLN
2.0000 g | INTRAVENOUS | Status: DC
  Filled 2020-09-23: qty 100

## 2020-09-23 MED ORDER — ETOMIDATE 2 MG/ML IV SOLN
INTRAVENOUS | Status: AC
  Administered 2020-09-23: 20 mg via INTRAVENOUS
  Filled 2020-09-23: qty 20

## 2020-09-23 MED ORDER — EPINEPHRINE HCL 5 MG/250ML IV SOLN IN NS
0.0000 ug/min | INTRAVENOUS | Status: DC
Start: 2020-09-23 — End: 2020-09-23
  Filled 2020-09-23: qty 250

## 2020-09-23 MED ORDER — DEXTROSE 50 % IV SOLN: 0.0000 mL | INTRAVENOUS | Status: DC | PRN

## 2020-09-23 MED ORDER — CHLORHEXIDINE GLUCONATE 0.12% ORAL RINSE (MEDLINE KIT)
15.0000 mL | Freq: Two times a day (BID) | OROMUCOSAL | Status: DC
  Administered 2020-09-23 – 2020-10-20 (×51): 15 mL via OROMUCOSAL

## 2020-09-23 MED ORDER — LACTATED RINGERS IV SOLN: INTRAVENOUS | Status: DC | PRN

## 2020-09-23 MED ORDER — METOPROLOL TARTRATE 25 MG/10 ML ORAL SUSPENSION: 12.5000 mg | Freq: Two times a day (BID) | ORAL | Status: DC

## 2020-09-23 MED ORDER — HEPARIN SODIUM (PORCINE) 1000 UNIT/ML IJ SOLN
INTRAMUSCULAR | Status: AC
  Filled 2020-09-23: qty 1

## 2020-09-23 MED ORDER — TRAMADOL HCL 50 MG PO TABS: 50.0000 mg | ORAL_TABLET | ORAL | Status: DC | PRN

## 2020-09-23 MED ORDER — SUCCINYLCHOLINE CHLORIDE 20 MG/ML IJ SOLN: 120.0000 mg | Freq: Once | INTRAMUSCULAR | Status: AC

## 2020-09-23 MED ORDER — PLASMA-LYTE 148 IV SOLN
INTRAVENOUS | Status: DC | PRN
  Administered 2020-09-23: 500 mL via INTRAVASCULAR

## 2020-09-23 MED ORDER — MIDAZOLAM HCL 5 MG/5ML IJ SOLN
INTRAMUSCULAR | Status: DC | PRN
  Administered 2020-09-23: 4 mg via INTRAVENOUS
  Administered 2020-09-23: 3 mg via INTRAVENOUS

## 2020-09-23 MED ORDER — BISACODYL 5 MG PO TBEC
10.0000 mg | DELAYED_RELEASE_TABLET | Freq: Every day | ORAL | Status: DC
  Administered 2020-09-25: 10 mg via ORAL
  Filled 2020-09-23: qty 2

## 2020-09-23 MED ORDER — ASPIRIN 81 MG PO CHEW
324.0000 mg | CHEWABLE_TABLET | Freq: Every day | ORAL | Status: DC
  Administered 2020-09-24 – 2020-09-25 (×2): 324 mg
  Filled 2020-09-23 (×2): qty 4

## 2020-09-23 MED ORDER — PHENYLEPHRINE HCL-NACL 20-0.9 MG/250ML-% IV SOLN
30.0000 ug/min | INTRAVENOUS | Status: DC
  Filled 2020-09-23: qty 250

## 2020-09-23 MED ORDER — TRANEXAMIC ACID 1000 MG/10ML IV SOLN
1.5000 mg/kg/h | INTRAVENOUS | Status: AC
  Administered 2020-09-23: 1.5 mg/kg/h via INTRAVENOUS
  Filled 2020-09-23 (×3): qty 25

## 2020-09-23 MED ORDER — NITROGLYCERIN 0.4 MG SL SUBL
0.4000 mg | SUBLINGUAL_TABLET | SUBLINGUAL | Status: DC | PRN
  Administered 2020-09-23: 0.4 mg via SUBLINGUAL
  Filled 2020-09-23: qty 1

## 2020-09-23 MED ORDER — DOCUSATE SODIUM 100 MG PO CAPS: 100.0000 mg | ORAL_CAPSULE | Freq: Two times a day (BID) | ORAL | Status: DC | PRN

## 2020-09-23 MED ORDER — INSULIN REGULAR(HUMAN) IN NACL 100-0.9 UT/100ML-% IV SOLN
INTRAVENOUS | Status: DC
  Filled 2020-09-23: qty 100

## 2020-09-23 MED ORDER — DOBUTAMINE IN D5W 4-5 MG/ML-% IV SOLN: 2.5000 ug/kg/min | INTRAVENOUS | Status: DC

## 2020-09-23 MED ORDER — FENTANYL CITRATE (PF) 250 MCG/5ML IJ SOLN
INTRAMUSCULAR | Status: DC | PRN
  Administered 2020-09-23 (×3): 150 ug via INTRAVENOUS
  Administered 2020-09-23: 50 ug via INTRAVENOUS
  Administered 2020-09-23: 150 ug via INTRAVENOUS

## 2020-09-23 MED ORDER — DEXMEDETOMIDINE HCL IN NACL 400 MCG/100ML IV SOLN
0.1000 ug/kg/h | INTRAVENOUS | Status: AC
  Administered 2020-09-23: .4 ug/kg/h via INTRAVENOUS
  Filled 2020-09-23: qty 100

## 2020-09-23 MED ORDER — ROCURONIUM BROMIDE 10 MG/ML (PF) SYRINGE
PREFILLED_SYRINGE | INTRAVENOUS | Status: DC | PRN
  Administered 2020-09-23 (×4): 50 mg via INTRAVENOUS

## 2020-09-23 MED ORDER — PHENYLEPHRINE HCL (PRESSORS) 10 MG/ML IV SOLN
INTRAVENOUS | Status: AC
  Filled 2020-09-23: qty 1

## 2020-09-23 MED ORDER — PHENYLEPHRINE 40 MCG/ML (10ML) SYRINGE FOR IV PUSH (FOR BLOOD PRESSURE SUPPORT)
PREFILLED_SYRINGE | INTRAVENOUS | Status: AC
  Filled 2020-09-23: qty 10

## 2020-09-23 MED ORDER — PLASMA-LYTE 148 IV SOLN
INTRAVENOUS | Status: DC
  Filled 2020-09-23: qty 2.5

## 2020-09-23 MED ORDER — LIDOCAINE HCL (PF) 1 % IJ SOLN
INTRAMUSCULAR | Status: AC
  Filled 2020-09-23: qty 30

## 2020-09-23 MED ORDER — MIDAZOLAM HCL 2 MG/2ML IJ SOLN
INTRAMUSCULAR | Status: DC | PRN
  Administered 2020-09-23: 2 mg via INTRAVENOUS

## 2020-09-23 MED ORDER — MIDAZOLAM HCL (PF) 10 MG/2ML IJ SOLN
INTRAMUSCULAR | Status: AC
  Filled 2020-09-23: qty 2

## 2020-09-23 MED ORDER — INSULIN REGULAR(HUMAN) IN NACL 100-0.9 UT/100ML-% IV SOLN
INTRAVENOUS | Status: AC
  Administered 2020-09-23: 2 [IU]/h via INTRAVENOUS
  Filled 2020-09-23: qty 100

## 2020-09-23 MED ORDER — FUROSEMIDE 10 MG/ML IJ SOLN
INTRAMUSCULAR | Status: AC
  Filled 2020-09-23: qty 8

## 2020-09-23 MED ORDER — CHLORHEXIDINE GLUCONATE CLOTH 2 % EX PADS
6.0000 | MEDICATED_PAD | Freq: Every day | CUTANEOUS | Status: DC
  Administered 2020-09-25 – 2020-10-01 (×4): 6 via TOPICAL

## 2020-09-23 MED ORDER — NOREPINEPHRINE 4 MG/250ML-% IV SOLN
0.0000 ug/min | INTRAVENOUS | Status: DC
  Administered 2020-09-23: 2 ug/min via INTRAVENOUS
  Administered 2020-09-24: 12 ug/min via INTRAVENOUS
  Administered 2020-09-25: 65 ug/min via INTRAVENOUS
  Administered 2020-09-25: 40 ug/min via INTRAVENOUS
  Administered 2020-09-25: 12 ug/min via INTRAVENOUS
  Administered 2020-09-25: 40 ug/min via INTRAVENOUS
  Filled 2020-09-23: qty 500
  Filled 2020-09-23 (×5): qty 250

## 2020-09-23 MED ORDER — CHLORHEXIDINE GLUCONATE CLOTH 2 % EX PADS: 6.0000 | MEDICATED_PAD | Freq: Once | CUTANEOUS | Status: DC

## 2020-09-23 MED ORDER — VERAPAMIL HCL 2.5 MG/ML IV SOLN
INTRAVENOUS | Status: AC
  Filled 2020-09-23: qty 2

## 2020-09-23 MED ORDER — FENTANYL BOLUS VIA INFUSION
25.0000 ug | INTRAVENOUS | Status: DC | PRN
  Filled 2020-09-23: qty 25

## 2020-09-23 MED ORDER — POLYETHYLENE GLYCOL 3350 17 G PO PACK: 17.0000 g | PACK | Freq: Every day | ORAL | Status: DC | PRN

## 2020-09-23 MED ORDER — ORAL CARE MOUTH RINSE
15.0000 mL | OROMUCOSAL | Status: DC
  Administered 2020-09-24 – 2020-10-20 (×255): 15 mL via OROMUCOSAL

## 2020-09-23 MED ORDER — NICARDIPINE HCL IN NACL 20-0.86 MG/200ML-% IV SOLN
3.0000 mg/h | INTRAVENOUS | Status: DC
  Filled 2020-09-23: qty 200

## 2020-09-23 MED ORDER — FUROSEMIDE 10 MG/ML IJ SOLN: 40.0000 mg | INTRAMUSCULAR | Status: DC

## 2020-09-23 MED ORDER — NICARDIPINE HCL IN NACL 20-0.86 MG/200ML-% IV SOLN
0.0000 mg/h | INTRAVENOUS | Status: DC
  Filled 2020-09-23: qty 200

## 2020-09-23 MED ORDER — NOREPINEPHRINE 4 MG/250ML-% IV SOLN
0.0000 ug/min | INTRAVENOUS | Status: AC
  Administered 2020-09-23: 2 ug/min via INTRAVENOUS
  Filled 2020-09-23: qty 250

## 2020-09-23 MED ORDER — SODIUM CHLORIDE 0.9% FLUSH
10.0000 mL | Freq: Two times a day (BID) | INTRAVENOUS | Status: DC
  Administered 2020-09-23 – 2020-09-25 (×5): 10 mL
  Administered 2020-09-26 – 2020-09-28 (×3): 20 mL
  Administered 2020-09-28 – 2020-09-30 (×3): 10 mL

## 2020-09-23 MED ORDER — SODIUM CHLORIDE 0.9% FLUSH: 3.0000 mL | INTRAVENOUS | Status: DC | PRN

## 2020-09-23 MED ORDER — PROPOFOL 1000 MG/100ML IV EMUL
0.0000 ug/kg/min | INTRAVENOUS | Status: DC
  Administered 2020-09-23: 30 ug/kg/min via INTRAVENOUS
  Filled 2020-09-23: qty 100

## 2020-09-23 MED ORDER — SODIUM CHLORIDE (PF) 0.9 % IJ SOLN
OROMUCOSAL | Status: DC | PRN
  Administered 2020-09-23 (×2): 4 mL via TOPICAL

## 2020-09-23 MED ORDER — BISACODYL 10 MG RE SUPP
10.0000 mg | Freq: Every day | RECTAL | Status: DC
  Administered 2020-09-26 – 2020-10-06 (×10): 10 mg via RECTAL
  Filled 2020-09-23 (×10): qty 1

## 2020-09-23 MED ORDER — FUROSEMIDE 10 MG/ML IJ SOLN
INTRAMUSCULAR | Status: DC | PRN
  Administered 2020-09-23: 80 mg via INTRAVENOUS

## 2020-09-23 MED ORDER — SODIUM CHLORIDE 0.9 % IV SOLN: INTRAVENOUS | Status: DC

## 2020-09-23 MED ORDER — HEPARIN (PORCINE) 25000 UT/250ML-% IV SOLN
1000.0000 [IU]/h | INTRAVENOUS | Status: DC
  Administered 2020-09-23: 1000 [IU]/h via INTRAVENOUS
  Filled 2020-09-23 (×2): qty 250

## 2020-09-23 MED ORDER — HEPARIN (PORCINE) 25000 UT/250ML-% IV SOLN: 14.0000 [IU]/kg/h | INTRAVENOUS | Status: DC

## 2020-09-23 MED ORDER — CHLORHEXIDINE GLUCONATE 0.12 % MT SOLN: 15.0000 mL | Freq: Once | OROMUCOSAL | Status: DC

## 2020-09-23 MED ORDER — ACETAMINOPHEN 650 MG RE SUPP
650.0000 mg | Freq: Once | RECTAL | Status: DC
  Filled 2020-09-23: qty 1

## 2020-09-23 MED ORDER — ROCURONIUM BROMIDE 10 MG/ML (PF) SYRINGE
PREFILLED_SYRINGE | INTRAVENOUS | Status: AC
  Filled 2020-09-23: qty 20

## 2020-09-23 MED ORDER — FENTANYL CITRATE (PF) 250 MCG/5ML IJ SOLN
INTRAMUSCULAR | Status: AC
  Filled 2020-09-23: qty 20

## 2020-09-23 MED ORDER — HEPARIN BOLUS VIA INFUSION
4000.0000 [IU] | Freq: Once | INTRAVENOUS | Status: AC
  Administered 2020-09-23: 4000 [IU] via INTRAVENOUS
  Filled 2020-09-23: qty 4000

## 2020-09-23 MED ORDER — SODIUM CHLORIDE 0.9 % IV SOLN
INTRAVENOUS | Status: AC | PRN
  Administered 2020-09-23: 10 mL/h via INTRAVENOUS

## 2020-09-23 MED ORDER — CEFAZOLIN SODIUM-DEXTROSE 2-4 GM/100ML-% IV SOLN
2.0000 g | Freq: Three times a day (TID) | INTRAVENOUS | Status: DC
  Administered 2020-09-23 – 2020-09-25 (×6): 2 g via INTRAVENOUS
  Filled 2020-09-23 (×9): qty 100

## 2020-09-23 MED ORDER — METOPROLOL TARTRATE 12.5 MG HALF TABLET: 12.5000 mg | ORAL_TABLET | Freq: Two times a day (BID) | ORAL | Status: DC

## 2020-09-23 MED ORDER — HEPARIN SODIUM (PORCINE) 1000 UNIT/ML IJ SOLN
INTRAMUSCULAR | Status: DC | PRN
  Administered 2020-09-23: 3000 [IU] via INTRAVENOUS
  Administered 2020-09-23: 2000 [IU] via INTRAVENOUS

## 2020-09-23 MED ORDER — ETOMIDATE 2 MG/ML IV SOLN: 20.0000 mg | Freq: Once | INTRAVENOUS | Status: AC

## 2020-09-23 MED ORDER — NITROGLYCERIN IN D5W 200-5 MCG/ML-% IV SOLN
2.0000 ug/min | INTRAVENOUS | Status: DC
  Filled 2020-09-23: qty 250

## 2020-09-23 MED ORDER — FUROSEMIDE 10 MG/ML IJ SOLN
20.0000 mg | Freq: Once | INTRAMUSCULAR | Status: AC
  Administered 2020-09-23: 20 mg via INTRAVENOUS
  Filled 2020-09-23: qty 4

## 2020-09-23 MED ORDER — PHENYLEPHRINE 40 MCG/ML (10ML) SYRINGE FOR IV PUSH (FOR BLOOD PRESSURE SUPPORT)
PREFILLED_SYRINGE | INTRAVENOUS | Status: DC | PRN
  Administered 2020-09-23: 120 ug via INTRAVENOUS

## 2020-09-23 MED ORDER — ALBUMIN HUMAN 5 % IV SOLN
250.0000 mL | INTRAVENOUS | Status: AC | PRN
  Administered 2020-09-23 – 2020-09-24 (×2): 12.5 g via INTRAVENOUS

## 2020-09-23 MED ORDER — ASPIRIN EC 325 MG PO TBEC: 325.0000 mg | DELAYED_RELEASE_TABLET | Freq: Every day | ORAL | Status: DC

## 2020-09-23 MED ORDER — 0.9 % SODIUM CHLORIDE (POUR BTL) OPTIME
TOPICAL | Status: DC | PRN
  Administered 2020-09-23: 4000 mL

## 2020-09-23 MED ORDER — SODIUM CHLORIDE 0.9 % IV SOLN: INTRAVENOUS | Status: DC | PRN

## 2020-09-23 MED ORDER — FUROSEMIDE 10 MG/ML IJ SOLN
INTRAMUSCULAR | Status: AC
  Administered 2020-09-23: 20 mg via INTRAVENOUS
  Filled 2020-09-23: qty 4

## 2020-09-23 SURGICAL SUPPLY — 96 items
BAG DECANTER FOR FLEXI CONT (MISCELLANEOUS) ×3 IMPLANT
BLADE CLIPPER SURG (BLADE) ×3 IMPLANT
BLADE STERNUM SYSTEM 6 (BLADE) ×3 IMPLANT
BLOWER MISTER CAL-MED (MISCELLANEOUS) ×3 IMPLANT
BNDG ELASTIC 4X5.8 VLCR STR LF (GAUZE/BANDAGES/DRESSINGS) ×6 IMPLANT
BNDG ELASTIC 6X5.8 VLCR STR LF (GAUZE/BANDAGES/DRESSINGS) ×6 IMPLANT
BNDG GAUZE ELAST 4 BULKY (GAUZE/BANDAGES/DRESSINGS) ×3 IMPLANT
CABLE SURGICAL S-101-97-12 (CABLE) ×3 IMPLANT
CANISTER SUCT 3000ML PPV (MISCELLANEOUS) ×3 IMPLANT
CANNULA MC2 2 STG 29/37 NON-V (CANNULA) ×2 IMPLANT
CANNULA MC2 TWO STAGE (CANNULA) ×3
CANNULA NON VENT 20FR 12 (CANNULA) ×6 IMPLANT
CATH ROBINSON RED A/P 18FR (CATHETERS) ×6 IMPLANT
CLIP RETRACTION 3.0MM CORONARY (MISCELLANEOUS) ×3 IMPLANT
CLIP VESOCCLUDE MED 24/CT (CLIP) IMPLANT
CLIP VESOCCLUDE SM WIDE 24/CT (CLIP) IMPLANT
CONN ST 1/2X1/2  BEN (MISCELLANEOUS) ×3
CONN ST 1/2X1/2 BEN (MISCELLANEOUS) ×2 IMPLANT
CONNECTOR BLAKE 2:1 CARIO BLK (MISCELLANEOUS) ×3 IMPLANT
CONTAINER PROTECT SURGISLUSH (MISCELLANEOUS) ×6 IMPLANT
DERMABOND ADVANCED (GAUZE/BANDAGES/DRESSINGS) ×1
DERMABOND ADVANCED .7 DNX12 (GAUZE/BANDAGES/DRESSINGS) ×2 IMPLANT
DRAIN CHANNEL 19F RND (DRAIN) ×9 IMPLANT
DRAIN CONNECTOR BLAKE 1:1 (MISCELLANEOUS) ×3 IMPLANT
DRAPE CARDIOVASCULAR INCISE (DRAPES) ×3
DRAPE INCISE IOBAN 66X45 STRL (DRAPES) IMPLANT
DRAPE SRG 135X102X78XABS (DRAPES) ×2 IMPLANT
DRAPE WARM FLUID 44X44 (DRAPES) ×3 IMPLANT
DRSG AQUACEL AG ADV 3.5X10 (GAUZE/BANDAGES/DRESSINGS) ×3 IMPLANT
DRSG AQUACEL AG ADV 3.5X14 (GAUZE/BANDAGES/DRESSINGS) ×3 IMPLANT
DRSG COVADERM 4X14 (GAUZE/BANDAGES/DRESSINGS) ×3 IMPLANT
ELECT BLADE 4.0 EZ CLEAN MEGAD (MISCELLANEOUS) ×3
ELECT REM PT RETURN 9FT ADLT (ELECTROSURGICAL) ×6
ELECTRODE BLDE 4.0 EZ CLN MEGD (MISCELLANEOUS) ×2 IMPLANT
ELECTRODE REM PT RTRN 9FT ADLT (ELECTROSURGICAL) ×4 IMPLANT
FELT TEFLON 1X6 (MISCELLANEOUS) ×6 IMPLANT
GAUZE SPONGE 4X4 12PLY STRL (GAUZE/BANDAGES/DRESSINGS) ×6 IMPLANT
GAUZE SPONGE 4X4 12PLY STRL LF (GAUZE/BANDAGES/DRESSINGS) ×3 IMPLANT
GLOVE BIO SURGEON STRL SZ7 (GLOVE) ×6 IMPLANT
GLOVE BIOGEL M STRL SZ7.5 (GLOVE) ×6 IMPLANT
GOWN STRL REUS W/ TWL LRG LVL3 (GOWN DISPOSABLE) ×8 IMPLANT
GOWN STRL REUS W/ TWL XL LVL3 (GOWN DISPOSABLE) ×4 IMPLANT
GOWN STRL REUS W/TWL LRG LVL3 (GOWN DISPOSABLE) ×12
GOWN STRL REUS W/TWL XL LVL3 (GOWN DISPOSABLE) ×6
HEMOSTAT POWDER SURGIFOAM 1G (HEMOSTASIS) ×9 IMPLANT
INSERT SUTURE HOLDER (MISCELLANEOUS) ×3 IMPLANT
KIT BASIN OR (CUSTOM PROCEDURE TRAY) ×3 IMPLANT
KIT DRAINAGE VACCUM ASSIST (KITS) ×3 IMPLANT
KIT SUCTION CATH 14FR (SUCTIONS) ×3 IMPLANT
KIT TURNOVER KIT B (KITS) ×3 IMPLANT
KIT VASOVIEW HEMOPRO 2 VH 4000 (KITS) ×6 IMPLANT
LEAD PACING MYOCARDI (MISCELLANEOUS) ×3 IMPLANT
MARKER GRAFT CORONARY BYPASS (MISCELLANEOUS) ×9 IMPLANT
NS IRRIG 1000ML POUR BTL (IV SOLUTION) ×15 IMPLANT
OFFPUMP STABILIZER SUV (MISCELLANEOUS) ×3 IMPLANT
PACK ACCESSORY CANNULA KIT (KITS) ×3 IMPLANT
PACK E OPEN HEART (SUTURE) ×3 IMPLANT
PACK OPEN HEART (CUSTOM PROCEDURE TRAY) ×3 IMPLANT
PAD ARMBOARD 7.5X6 YLW CONV (MISCELLANEOUS) ×6 IMPLANT
PAD ELECT DEFIB RADIOL ZOLL (MISCELLANEOUS) ×3 IMPLANT
PENCIL BUTTON HOLSTER BLD 10FT (ELECTRODE) ×3 IMPLANT
POSITIONER ACROBAT-I OFFPUMP (MISCELLANEOUS) ×3 IMPLANT
POSITIONER HEAD DONUT 9IN (MISCELLANEOUS) ×3 IMPLANT
PUMP SARN DELFIN (MISCELLANEOUS) ×3 IMPLANT
PUNCH AORTIC ROTATE 4.0MM (MISCELLANEOUS) ×3 IMPLANT
SET CARDIOPLEGIA MPS 5001102 (MISCELLANEOUS) ×3 IMPLANT
SPONGE LAP 18X18 X RAY DECT (DISPOSABLE) ×3 IMPLANT
SUPPORT HEART JANKE-BARRON (MISCELLANEOUS) ×3 IMPLANT
SUT BONE WAX W31G (SUTURE) ×3 IMPLANT
SUT ETHIBOND 2 0 SH (SUTURE) ×3
SUT ETHIBOND 2 0 SH 36X2 (SUTURE) ×2 IMPLANT
SUT ETHIBOND X763 2 0 SH 1 (SUTURE) ×9 IMPLANT
SUT MNCRL AB 3-0 PS2 18 (SUTURE) ×9 IMPLANT
SUT PDS AB 1 CTX 36 (SUTURE) ×6 IMPLANT
SUT PROLENE 4 0 RB 1 (SUTURE) ×6
SUT PROLENE 4 0 SH DA (SUTURE) ×15 IMPLANT
SUT PROLENE 4-0 RB1 .5 CRCL 36 (SUTURE) ×2 IMPLANT
SUT PROLENE 4-0 RB1 18X2 ARM (SUTURE) ×2 IMPLANT
SUT PROLENE 5 0 C 1 36 (SUTURE) ×24 IMPLANT
SUT PROLENE 7 0 BV 1 (SUTURE) ×3 IMPLANT
SUT PROLENE 7 0 BV1 MDA (SUTURE) ×9 IMPLANT
SUT STEEL 6MS V (SUTURE) ×6 IMPLANT
SUT STEEL STERNAL CCS#1 18IN (SUTURE) ×9 IMPLANT
SUT VIC AB 1 CTX 36 (SUTURE) ×3
SUT VIC AB 1 CTX36XBRD ANBCTR (SUTURE) ×2 IMPLANT
SUT VIC AB 2-0 CT1 27 (SUTURE) ×3
SUT VIC AB 2-0 CT1 TAPERPNT 27 (SUTURE) ×2 IMPLANT
SYSTEM SAHARA CHEST DRAIN ATS (WOUND CARE) ×3 IMPLANT
TAPE PAPER 3X10 WHT MICROPORE (GAUZE/BANDAGES/DRESSINGS) ×3 IMPLANT
TAPES RETRACTO (MISCELLANEOUS) ×6 IMPLANT
TOWEL GREEN STERILE (TOWEL DISPOSABLE) ×3 IMPLANT
TOWEL GREEN STERILE FF (TOWEL DISPOSABLE) ×3 IMPLANT
TRAY FOLEY SLVR 16FR TEMP STAT (SET/KITS/TRAYS/PACK) ×3 IMPLANT
TUBING LAP HI FLOW INSUFFLATIO (TUBING) ×3 IMPLANT
UNDERPAD 30X36 HEAVY ABSORB (UNDERPADS AND DIAPERS) ×3 IMPLANT
WATER STERILE IRR 1000ML POUR (IV SOLUTION) ×6 IMPLANT

## 2020-09-23 SURGICAL SUPPLY — 20 items
BALLN IABP SENSA PLUS 8F 50CC (BALLOONS) ×2
BALLOON IABP SENS PLUS 8F 50CC (BALLOONS) ×1 IMPLANT
CATH INFINITI 5FR ANG PIGTAIL (CATHETERS) ×2 IMPLANT
CATH INFINITI 5FR JL4 (CATHETERS) ×2 IMPLANT
CATH INFINITI JR4 5F (CATHETERS) ×2 IMPLANT
CATH SWAN GANZ VIP 7.5F (CATHETERS) ×2 IMPLANT
GLIDESHEATH SLEND A-KIT 6F 22G (SHEATH) IMPLANT
GUIDEWIRE INQWIRE 1.5J.035X260 (WIRE) IMPLANT
INQWIRE 1.5J .035X260CM (WIRE)
KIT HEART LEFT (KITS) ×2 IMPLANT
KIT MICROPUNCTURE NIT STIFF (SHEATH) ×2 IMPLANT
PACK CARDIAC CATHETERIZATION (CUSTOM PROCEDURE TRAY) ×2 IMPLANT
SHEATH PINNACLE 6F 10CM (SHEATH) ×4 IMPLANT
SHEATH PINNACLE 8F 10CM (SHEATH) ×2 IMPLANT
SHEATH PROBE COVER 6X72 (BAG) ×2 IMPLANT
SLEEVE REPOSITIONING LENGTH 30 (MISCELLANEOUS) ×2 IMPLANT
TRANSDUCER W/STOPCOCK (MISCELLANEOUS) ×2 IMPLANT
TUBING CIL FLEX 10 FLL-RA (TUBING) ×2 IMPLANT
WIRE EMERALD 3MM-J .035X150CM (WIRE) ×2 IMPLANT
WIRE MICROINTRODUCER 60CM (WIRE) ×2 IMPLANT

## 2020-09-23 NOTE — ED Notes (Signed)
Repeat EKG provided to provider

## 2020-09-23 NOTE — ED Notes (Signed)
Dr. Wilkie Aye notified of critical troponin of [phone]

## 2020-09-23 NOTE — ED Notes (Signed)
Pt c/o increased work of breathing. BIPAP in place. MD will be notified

## 2020-09-23 NOTE — Consult Note (Addendum)
CARDIOLOGY CONSULT NOTE  Patient ID: Jack Vance MRN: [id number] DOB/AGE: 07-24-1940 80 y.o.  Admit date: 09/23/2020 Referring Physician  Thayer Headings, MD Primary Physician:  [facility] Reason for Consultation  Acute pulmonary edema and cardiogenic shock  Patient ID: Jack Vance, male    DOB: October 25, 1940, 80 y.o.   MRN: [id number]   CC: Chest pain and dyspnea HPI:    Jack Vance  is a 80 y.o. Caucasian male patient who routinely follows his care at [facility],?  Hypertension, otherwise no significant prior cardiovascular history, chronic anemia, history of ventral hernia repair,?  Bronchial asthma who had been having chest discomfort that started 2 days ago, retrosternal in the form of heaviness to pressure-like sensation.  He also noticed marked dyspnea on exertion which got profoundly worse yesterday and hence presented to the emergency room.  He was found to be in acute pulmonary edema and he was started on BiPAP.  His cardiac markers were positive for non-STEMI and EKG was markedly abnormal.  I was consulted to manage the patient.  Upon my arrival, patient was in acute respiratory distress, still having active chest pain.  He is half-sister who lives with him is present at the bedside.  Past Medical History:  Diagnosis Date  . Arthritis   . Asthma   . DDD (degenerative disc disease), lumbar   . Hernia of abdominal wall   . RLS (restless legs syndrome)    Past Surgical History:  Procedure Laterality Date  . HERNIA REPAIR    . SHOULDER SURGERY     Social History   Tobacco Use  . Smoking status: Never Smoker  . Smokeless tobacco: Never Used  Substance Use Topics  . Alcohol use: Yes    Comment: 4x week    Family History  Problem Relation Age of Onset  . Hypertension Maternal Aunt     Marital Sttus: Single  ROS  Review of Systems  Constitutional: Positive for malaise/fatigue.  HENT: Negative.   Cardiovascular: Positive for chest pain and  orthopnea.  Respiratory: Positive for shortness of breath and wheezing.   Musculoskeletal: Positive for arthritis.  Gastrointestinal: Positive for hemorrhoids (no active bleeding presently) and melena (dark stool 2 days since he went on Fe suppliments).  Genitourinary: Negative.   Neurological: Negative.   Psychiatric/Behavioral: Negative.    Objective   Vitals with BMI 09/23/2020 09/23/2020 09/23/2020  Height - - -  Weight - - -  BMI - - -  Systolic 153 149 025  Diastolic 97 94 97  Pulse 115 118 114    Blood pressure (!) 153/97, pulse (!) 115, temperature 97.7 F (36.5 C), temperature source Oral, resp. rate (!) 34, height 5\' 6"  (1.676 m), weight 83 kg, SpO2 100 %.    Physical Exam Vitals reviewed.  Constitutional:      General: He is in acute distress.     Appearance: He is ill-appearing and diaphoretic.  HENT:     Mouth/Throat:     Mouth: Mucous membranes are moist.  Eyes:     Extraocular Movements: Extraocular movements intact.  Neck:     Vascular: JVD present. No carotid bruit.  Cardiovascular:     Rate and Rhythm: Regular rhythm. Tachycardia present.     Pulses: Normal pulses and intact distal pulses.     Heart sounds: No murmur heard. Gallop present. S3 sounds present.   Pulmonary:     Effort: Pulmonary effort is normal.     Breath sounds:  Normal breath sounds.  Abdominal:     General: Bowel sounds are normal.     Palpations: Abdomen is soft.  Musculoskeletal:        General: No swelling or tenderness.     Right lower leg: No edema.     Left lower leg: No edema.  Skin:    General: Skin is warm.  Neurological:     General: No focal deficit present.     Mental Status: He is alert and oriented to person, place, and time.    Laboratory examination:    Recent Labs    09/23/20 0311  NA 137  K 4.1  CL 100  CO2 21*  GLUCOSE 140*  BUN 36*  CREATININE 1.23  CALCIUM 9.1  GFRNONAA >60   estimated creatinine clearance is 50.1 mL/min (by C-G formula based on  SCr of 1.23 mg/dL).  CMP Latest Ref Rng & Units 09/23/2020 12/08/2017 12/07/2017  Glucose 70 - 99 mg/dL 081(N) 887(J) 959(D)  BUN 8 - 23 mg/dL 47(V) 15 15  Creatinine 0.61 - 1.24 mg/dL 8.55 0.15 8.68  Sodium 135 - 145 mmol/L 137 139 141  Potassium 3.5 - 5.1 mmol/L 4.1 4.0 4.3  Chloride 98 - 111 mmol/L 100 103 102  CO2 22 - 32 mmol/L 21(L) 27 29  Calcium 8.9 - 10.3 mg/dL 9.1 8.9 9.1  Total Protein 6.5 - 8.1 g/dL 7.2 - -  Total Bilirubin 0.3 - 1.2 mg/dL 0.9 - -  Alkaline Phos 38 - 126 U/L 77 - -  AST 15 - 41 U/L 131(H) - -  ALT 0 - 44 U/L 94(H) - -   CBC Latest Ref Rng & Units 09/23/2020 12/09/2017 12/08/2017  WBC 4.0 - 10.5 K/uL 14.5(H) 7.5 7.3  Hemoglobin 13.0 - 17.0 g/dL 7.5(L) 9.8(L) 10.2(L)  Hematocrit 39.0 - 52.0 % 24.7(L) 31.0(L) 32.0(L)  Platelets 150 - 400 K/uL 269 309 314   Lipid Panel No results for input(s): CHOL, TRIG, LDLCALC, VLDL, HDL, CHOLHDL, LDLDIRECT in the last 8760 hours.  HEMOGLOBIN A1C No results found for: HGBA1C, MPG TSH No results for input(s): TSH in the last 8760 hours. BNP (last 3 results) Recent Labs    09/23/20 0311  BNP 1,191.8*   Medications and allergies   Allergies  Allergen Reactions  . Cheese     Causes asthma  . Morphine And Related Rash     Current Meds  Medication Sig  . acetaminophen (TYLENOL) 325 MG tablet Take 650 mg by mouth every 4 (four) hours as needed for pain.  Marland Kitchen aspirin EC 81 MG tablet Take 81 mg by mouth daily. Swallow whole.  Marland Kitchen atorvastatin (LIPITOR) 80 MG tablet Take 80 mg by mouth daily.  . benzonatate (TESSALON) 100 MG capsule Take 100 mg by mouth 3 (three) times daily as needed for cough.  . diclofenac Sodium (VOLTAREN) 1 % GEL Apply 2 g topically 2 (two) times daily as needed for pain.  . ferrous gluconate (FERGON) 324 MG tablet Take 324 mg by mouth daily.  . finasteride (PROSCAR) 5 MG tablet Take 5 mg by mouth daily.  Marland Kitchen FLUoxetine (PROZAC) 20 MG capsule Take 20 mg by mouth daily.  . fluticasone (FLONASE) 50 MCG/ACT  nasal spray Place 1 spray into both nostrils daily.  . fluticasone-salmeterol (ADVAIR) 250-50 MCG/ACT AEPB Inhale 1 puff into the lungs 2 (two) times daily.  Marland Kitchen ibuprofen (ADVIL) 200 MG tablet Take 400 mg by mouth every 6 (six) hours as needed for mild pain.  Marland Kitchen  Ipratropium-Albuterol (COMBIVENT) 20-100 MCG/ACT AERS respimat Inhale 1 puff into the lungs every 6 (six) hours.  . lidocaine (LIDODERM) 5 % Place 1 patch onto the skin daily. Remove & Discard patch within 12 hours or as directed by MD  . montelukast (SINGULAIR) 10 MG tablet Take 10 mg by mouth daily.  . Multiple Vitamin (MULTIVITAMIN) capsule Take 1 capsule by mouth daily.  Marland Kitchen omeprazole (PRILOSEC) 40 MG capsule Take 40 mg by mouth daily.  Marland Kitchen rOPINIRole (REQUIP) 2 MG tablet Take 4 mg by mouth 3 (three) times daily.  . tamsulosin (FLOMAX) 0.4 MG CAPS capsule Take 0.4 mg by mouth daily.  . Tiotropium Bromide Monohydrate 2.5 MCG/ACT AERS Inhale 2 puffs into the lungs daily.  . traMADol (ULTRAM) 50 MG tablet Take 1 tablet by mouth 3 (three) times daily as needed for pain.  Marland Kitchen triamterene-hydrochlorothiazide (MAXZIDE-25) 37.5-25 MG tablet Take 1 tablet by mouth daily.    Scheduled Meds: Continuous Infusions: . heparin 1,000 Units/hr (09/23/20 0542)  . nitroGLYCERIN 20 mcg/min (09/23/20 0714)   PRN Meds:.docusate sodium, nitroGLYCERIN, ondansetron (ZOFRAN) IV, polyethylene glycol   I/O last 3 completed shifts: In: 51.1 [I.V.:51.1] Out: -  Total I/O In: 3.9 [I.V.:3.9] Out: -     Radiology:   DG Chest 2 View  Result Date: 09/23/2020 CLINICAL DATA:  Shortness of breath, asthma EXAM: CHEST - 2 VIEW COMPARISON:  Radiograph 01/08/2006 FINDINGS: Extensive multifocal patchy opacities present throughout both lungs including more coalescent density in the right infrahilar lung and left basilar periphery. No pneumothorax. Suspect small bilateral effusions. Prominence of the cardiac silhouette though may be accentuated by portable technique.  Pulmonary vascularity is redistributed indistinct. Telemetry leads overlie the chest. Degenerative changes are present in the imaged spine and shoulders. Prior right humeral ORIF without acute complication. IMPRESSION: Multifocal heterogeneous opacities throughout both lungs, could reflect a multifocal infection (including atypical etiologies such as COVID 19) and/or asymmetric edema with additional features of heart failure including a prominent cardiac silhouette, effusions or pulmonary vascular congestion. Electronically Signed   By: Kreg Shropshire M.D.   On: 09/23/2020 04:34   DG Chest Portable 1 View  Result Date: 09/23/2020 CLINICAL DATA:  Intubation.  Burning chest pain for 2 days EXAM: PORTABLE CHEST 1 VIEW COMPARISON:  Earlier today FINDINGS: Endotracheal tube with tip between the clavicular heads and carina. Progressive airspace disease which is asymmetric to the right. Borderline heart size. No effusion or pneumothorax. IMPRESSION: 1. New endotracheal tube in unremarkable position. 2. Progressive airspace disease with asymmetry to the right suggesting infection. Electronically Signed   By: Marnee Spring M.D.   On: 09/23/2020 09:16    Cardiac Studies:   Echocardiogram 09/23/2020:   1. Left ventricular ejection fraction, by estimation, is <20%. The left ventricle has severely decreased function. Severe global hypokinesis with akinesis of anterolateral wall. Grade II diastolic dysfunction (pseudonormalization). Elevated left atrial  pressure.  2. Right ventricular systolic function is moderately reduced. The right ventricular size is normal. There is moderately elevated pulmonary artery systolic pressure.  3. Left atrial size was mildly dilated.  4. The mitral valve is grossly normal. Mild to moderate mitral valve regurgitation.  5. Tricuspid valve regurgitation is moderate. Peak RA-RV gradient 41 mmHg.   EKG:  09/23/20 S. Tachy @ 101/min. ST elevation in aVR 2 mm and profound global ST  depression 2 mm.  Assessment   NSTEMI with cardiogenic shock Severe iron deficiency anemia, chronic History of bronchial asthma Recommendations:   While I was evaluating the  patient, he had worsening shortness of breath and respiratory distress and hence needed to be intubated immediately.  I have discussed with him and his Sister Aurther Loft who was at the bedside regarding proceeding with emergent cardiac catheterization and need to transfer to [facility].  I also performed bedside echocardiogram while awaiting intubation arrangements and transfer arrangements, severe LV systolic dysfunction with EF around 10% with global hypokinesis and anterolateral hypokinesis noted along with moderate MR.  He also had moderate pulm hypertension.  He will be taken emergently to cardiac catheterization lab and further evaluation depending upon findings.  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  Patient is a non-smoker, no history of alcohol use, he has known severe anemia, his hemoglobin was 7.5 today and he was evaluated 3 weeks ago by [facility] and was told to have iron deficiency anemia.  He has no history of GI bleed, he does have a history of hemorrhoids but no recent bleed.  His guaiac stool test at the bedside was negative for GI bleed.  I have sent for iron studies and also cross and type blood for emergent use.  I spent a total of 2 hours with the patient starting with a phone call around 615 to the ED physician and bedside evaluation at 730 until 930 while patient got transferred.  Patient, he can, patient developed hypotension needing starting of norepinephrine.  Arrangements made for patient going emergently to the cardiac catheterization lab.  I appreciate Dr. Linwood Dibbles who assisted me with intubation and management of critical care patient.  I spent a total of 90 minutes with the patient and was present through out the intubation process and  Proceeded to go  to the cath lab to receive him after he was stabilized. This was critical time spent with the patient making complex decisions.    Yates Decamp, MD, [facility] 09/23/2020, 8:14 AM Office: [phone]

## 2020-09-23 NOTE — ED Notes (Signed)
RT at bedside placing pt on BIPAP

## 2020-09-23 NOTE — Progress Notes (Addendum)
Got paged for marked EKG abnormality, chest pain and pulmonary edema and respiratory distress. Patient has acute ongoing ischemia and severely anemic, needing admission to ICU and medical stabilization and probably cath.   Not sure who he is being admitted under and I will follow.  Spoke to bed control and also to Dr. Wilkie Aye. PCCM will be seeing him soon at [facility] and I am happy to either continue to be a consultant or admit patient to 2H.   He appears to have multiple medical issues and I suspect if medical admission is appropriate for stabilization and I continue as a Research scientist (medical). Have also advised bed control that I am accepting the patient to 2 H.  Await call from [facility] also.    Yates Decamp, MD, [facility] 09/23/2020, 6:11 AM Office: [phone] Pager: [phone]  M: [phone]

## 2020-09-23 NOTE — ED Notes (Signed)
Riki Rusk, RN verified Nitrogylcerin drip

## 2020-09-23 NOTE — Progress Notes (Signed)
  Echocardiogram 2D Echocardiogram has been performed.  Jack Vance [name] 09/23/2020, 9:00 AM

## 2020-09-23 NOTE — Progress Notes (Signed)
Assessed patient at bedside - pending transfer to [facility] for Cardiology evaluation. Sister at bedside.  He continues to report shortness of breath and burning in his chest.  Sister notes he has had increased SOB for 2 weeks but specifically worse in the last 4 days.  He is on NTG, heparin gtt's.  BiPAP in place with good volumes, no accessory muscle use / speaking full sentences despite mask.  BP stable.  Fine crackles in lower lateral / bases posterior.  1+ BLE pitting edema.  Labs reviewed > BNP 1,191, Troponin 7,464, LA 2.4, BUN 36 / sr Cr 1.23.  Bed available at [facility], report being called.  Receiving team notified.      Canary Brim, MSN, APRN, NP-C, AGACNP-BC [facility] 09/23/2020, 7:54 AM   Please see [email or web address] for pager details.   From 7A-7P if no response, please call [phone] After hours, please call ELink [phone]

## 2020-09-23 NOTE — ED Notes (Signed)
Pt requested oxygen to be applied. [place] placed on pt and put on 4L per pt request for comfort

## 2020-09-23 NOTE — Progress Notes (Signed)
ANTICOAGULATION CONSULT NOTE - Initial Consult  Pharmacy Consult for heparin Indication: NSTEMI  Allergies  Allergen Reactions  . Cheese     Causes asthma  . Morphine And Related Rash    Patient Measurements: Height: 5\' 6"  (167.6 cm) Weight: 83 kg (183 lb) IBW/kg (Calculated) : 63.8 Heparin Dosing Weight: 83kg  Vital Signs: Temp: 97.7 F (36.5 C) (05/06 0248) Temp Source: Oral (05/06 0248) BP: 123/84 (05/06 0420) Pulse Rate: 107 (05/06 0420)  Labs: Recent Labs    09/23/20 0311  HGB 7.5*  HCT 24.7*  PLT 269  LABPROT 14.7  INR 1.2  CREATININE 1.23  TROPONINIHS 7,318*    Estimated Creatinine Clearance: 50.1 mL/min (by C-G formula based on SCr of 1.23 mg/dL).   Medical History: Past Medical History:  Diagnosis Date  . Arthritis   . Asthma   . DDD (degenerative disc disease), lumbar   . Hernia of abdominal wall   . RLS (restless legs syndrome)      Assessment:  80 year old male with past medical history of HTN, HLD, asthma, anemia presents to the ED with shortness of breath and burning chest pain. Pharmacy consulted to dose heparin drip.  No prior AC noted  09/23/2020 Hgb 7.5, plts 269, Scr 1.23 Trop 7318  Goal of Therapy:  Heparin level 0.3-0.7 units/ml Monitor platelets by anticoagulation protocol:   Plan:  Heparin bolus 4000 units x 1 Start heparin drip at 1000 units/hr Heparin level in 8 hours Daily CBC   11/23/2020 RPh 09/23/2020, 4:27 AM

## 2020-09-23 NOTE — H&P (Signed)
NAME:  Jack Vance MRN:  [id number] DOB:  [birth year] LOS: 0 ADMISSION DATE:  09/23/2020 DATE OF SERVICE:  09/23/2020  CHIEF COMPLAINT: Chest pain  HISTORY & PHYSICAL  History of Present Illness  This 80 y.o. morbidly obese Caucasian male [place] [employer] veteran presented to [facility] emergency department with complaints of 2 days of burning chest pain.  For aggravating factors, the patient identifies it he had more chest pain whenever he had a sense of dyspnea.  This has been alleviated with initiation of BiPAP therapy in the emergency department.  He has had progressively increasing dyspnea over the past week or so.  He also endorses that he has had worsening paroxysmal nocturnal dyspnea.  He endorses orthopnea.  He denies history of coronary artery disease or prior myocardial infarction.  He denies history of stroke.  In the emergency department, twelve-lead EKG showed diffuse repolarization abnormalities, suspicious for global ischemia.  Troponin was greater than 7000.  BNP 1191.  Per discussion with ER physician, case was discussed with cardiology fellow covering for the night.  The patient has been started on aspirin and heparin.  REVIEW OF SYSTEMS Constitutional: No weight loss. No night sweats. No fever. No chills. No fatigue. HEENT: No headaches, dysphagia, sore throat, otalgia, nasal congestion, postnasal drip. CV: Orthopnea, lower extremity swelling.  Burning chest pain, better on BiPAP.  Nocturia.  No palpitations GI:  No abdominal pain, nausea, vomiting, diarrhea, change in bowel pattern, anorexia Resp: Cough that was productive of clear mucus secretions, which changed to green color in the emergency department.  Exertional dyspnea.  Dyspnea at rest.  Wheezing.  No hemoptysis.  Reports history of asthma. GU: Difficulty with urination.  No change in color of urine, no urgency or frequency.  No flank pain. MS:  No joint pain or swelling. No myalgias,  No decreased range  of motion.  Psych:  No change in mood or affect. No memory loss. Skin: no rash or lesions.   Past Medical/Surgical/Social/Family History   Past Medical History:  Diagnosis Date  . Arthritis   . Asthma   . DDD (degenerative disc disease), lumbar   . Hernia of abdominal wall   . RLS (restless legs syndrome)     Past Surgical History:  Procedure Laterality Date  . HERNIA REPAIR    . SHOULDER SURGERY      Social History   Tobacco Use  . Smoking status: Never Smoker  . Smokeless tobacco: Never Used  Substance Use Topics  . Alcohol use: Yes    Comment: 4x week    Family History  Problem Relation Age of Onset  . Hypertension Maternal Aunt      Procedures:     Significant Diagnostic Tests:  5/6: BNP 1192.  Troponin [id number].   Micro Data:   Results for orders placed or performed during the hospital encounter of 09/23/20  Resp Panel by RT-PCR (Flu A&B, Covid) Nasopharyngeal Swab     Status: None   Collection Time: 09/23/20  3:20 AM   Specimen: Nasopharyngeal Swab; Nasopharyngeal(NP) swabs in vial transport medium  Result Value Ref Range Status   SARS Coronavirus 2 by RT PCR NEGATIVE NEGATIVE Final    Comment: (NOTE) SARS-CoV-2 target nucleic acids are NOT DETECTED.  The SARS-CoV-2 RNA is generally detectable in upper respiratory specimens during the acute phase of infection. The lowest concentration of SARS-CoV-2 viral copies this assay can detect is 138 copies/mL. A negative result does not preclude SARS-Cov-2 infection and  should not be used as the sole basis for treatment or other patient management decisions. A negative result may occur with  improper specimen collection/handling, submission of specimen other than nasopharyngeal swab, presence of viral mutation(s) within the areas targeted by this assay, and inadequate number of viral copies(<138 copies/mL). A negative result must be combined with clinical observations, patient history, and  epidemiological information. The expected result is Negative.  Fact Sheet for Patients:  BloggerCourse.com  Fact Sheet for Healthcare Providers:  SeriousBroker.it  This test is no t yet approved or cleared by the [place] FDA and  has been authorized for detection and/or diagnosis of SARS-CoV-2 by FDA under an Emergency Use Authorization (EUA). This EUA will remain  in effect (meaning this test can be used) for the duration of the COVID-19 declaration under Section 564(b)(1) of the Act, 21 U.S.C.section 360bbb-3(b)(1), unless the authorization is terminated  or revoked sooner.       Influenza A by PCR NEGATIVE NEGATIVE Final   Influenza B by PCR NEGATIVE NEGATIVE Final    Comment: (NOTE) The Xpert Xpress SARS-CoV-2/FLU/RSV plus assay is intended as an aid in the diagnosis of influenza from Nasopharyngeal swab specimens and should not be used as a sole basis for treatment. Nasal washings and aspirates are unacceptable for Xpert Xpress SARS-CoV-2/FLU/RSV testing.  Fact Sheet for Patients: BloggerCourse.com  Fact Sheet for Healthcare Providers: SeriousBroker.it  This test is not yet approved or cleared by the [place] FDA and has been authorized for detection and/or diagnosis of SARS-CoV-2 by FDA under an Emergency Use Authorization (EUA). This EUA will remain in effect (meaning this test can be used) for the duration of the COVID-19 declaration under Section 564(b)(1) of the Act, 21 U.S.C. section 360bbb-3(b)(1), unless the authorization is terminated or revoked.  Performed at [facility], [address] [place], [place] [address]       Antimicrobials:      Interim history/subjective:     Objective   BP (!) 159/95   Pulse (!) 122   Temp 97.7 F (36.5 C) (Oral)   Resp (!) 25   Ht 5\' 6"  (1.676 m)   Wt 83 kg   SpO2 100%   BMI  29.54 kg/m     Filed Weights   09/23/20 0251  Weight: 83 kg    Intake/Output Summary (Last 24 hours) at 09/23/2020 11/23/2020 Last data filed at 09/23/2020 0631 Gross per 24 hour  Intake 50.82 ml  Output --  Net 50.82 ml        Examination: GENERAL: Very hard of hearing.  alert, oriented to time, person and place, pleasant and morbidly obese. No acute distress. HEAD: normocephalic, atraumatic EYE: PERRLA, EOM intact, no scleral icterus, no pallor.11/23/2020 CAVITY: Normal dentition. No oral thrush. No exudate. Mucous membranes are moist. No tonsillar enlargement.  Mallampati class IV (only hard palate visible) airway. NECK: supple, no thyromegaly, no JVD, no lymphadenopathy. Trachea midline. CHEST/LUNG: symmetric in development and expansion. Good air entry.  Diffuse bilateral crackles.   HEART: Regular S1 and S2 without murmur, rub or gallop.  Auscultation done on BiPAP. ABDOMEN: soft, nontender, nondistended. Normoactive bowel sounds. No rebound. No guarding. No hepatosplenomegaly. EXTREMITIES: Edema: 1+. No cyanosis.  No clubbing. 2+ DP pulses LYMPHATIC: no cervical/axillary/inguinal lymph nodes appreciated MUSCULOSKELETAL: No point tenderness.  No bulk atrophy. Joints: Normal inspection.  SKIN: No rash or lesion. NEUROLOGIC: Doll's eyes intact. Corneal reflex intact. Spontaneous respirations intact. Cranial nerves II-XII are grossly symmetric and  physiologic. Babinski absent. No sensory deficit. Motor: 5/5 @ RUE, 5/5 @ LUE, 5/5 @ RLL,  5/5 @ LLL.  DTR: 2+ @ R biceps, 2+ @ L biceps, 2+ @ R patellar,  2+ @ L patellar. No cerebellar signs. Gait was not assessed.   Resolved Hospital Problem list      Assessment & Plan:   ASSESSMENT/PLAN:  ASSESSMENT (included in the Hospital Problem List)  Active Problems:   Asthma   Acute myocardial infarction (HCC)   Acute heart failure (HCC)   Morbid obesity (HCC)   Hypertension   By systems: PULMONARY Asthma Abnormal chest  x-ray  Furosemide 40 mg IV single dose now.    Albuterol every 6 hours scheduled, Pulmicort 0.5 mg every 12 hours scheduled.    ABG on BiPAP: 7.34/44/PaO2 pending  Wean FiO2 as tolerated.  Maintain SPO2 93+%.  Check procalcitonin   CARDIOVASCULAR Acute myocardial infarction Post MI pump failure  Started on aspirin and heparin in the emergency department.  Case discussed with Dr. Jacinto HalimGanji.  RENAL Creatinine 1.23. Patient reports difficulty with urination  Place Foley catheter.  Monitor urine output.  Strict I/O.  Avoid nephrotoxic drugs.   GASTROINTESTINAL: No acute issues  GI PROPHYLAXIS: Protonix   HEMATOLOGIC Normocytic anemia, hemoglobin 7.5  DVT PROPHYLAXIS: Not required with heparin infusion for acute myocardial infarction  Monitor hemoglobin.   INFECTIOUS: No acute issues  Check procalcitonin   ENDOCRINE: No acute issues   NEUROLOGIC: No acute issues  N.b. this patient is very hard of hearing   PLAN/RECOMMENDATIONS   Admit to ICU under my service (Attending: Marcelle SmilingSeong-Joo Pablo Stauffer, MD) with the diagnoses highlighted above in the active Hospital Problem List (ASSESSMENT).     My assessment, plan of care, findings, medications, side effects, etc. were discussed with: nurse and Dr. Jacinto HalimGanji (Cardiology).   Best practice:  Diet: N.p.o. for now Pain/Anxiety/Delirium protocol (if indicated): Not applicable VAP protocol (if indicated): Not applicable DVT prophylaxis: Not indicated with heparin infusion for acute MI GI prophylaxis: Protonix Glucose control: Not applicable Mobility/Activity: Bedrest   Code Status: Full Code Family Communication:  patient's family (Half-sister at bedside) Disposition: Admit to 2H   Labs   CBC: Recent Labs  Lab 09/23/20 0311  WBC 14.5*  NEUTROABS 12.5*  HGB 7.5*  HCT 24.7*  MCV 80.7  PLT 269    Basic Metabolic Panel: Recent Labs  Lab 09/23/20 0311  NA 137  K 4.1  CL 100  CO2 21*  GLUCOSE 140*  BUN 36*   CREATININE 1.23  CALCIUM 9.1   GFR: Estimated Creatinine Clearance: 50.1 mL/min (by C-G formula based on SCr of 1.23 mg/dL). Recent Labs  Lab 09/23/20 0311  WBC 14.5*  LATICACIDVEN 2.5*    Liver Function Tests: Recent Labs  Lab 09/23/20 0311  AST 131*  ALT 94*  ALKPHOS 77  BILITOT 0.9  PROT 7.2  ALBUMIN 3.5   No results for input(s): LIPASE, AMYLASE in the last 168 hours. No results for input(s): AMMONIA in the last 168 hours.  ABG No results found for: PHART, PCO2ART, PO2ART, HCO3, TCO2, ACIDBASEDEF, O2SAT   Coagulation Profile: Recent Labs  Lab 09/23/20 0311  INR 1.2    Cardiac Enzymes: No results for input(s): CKTOTAL, CKMB, CKMBINDEX, TROPONINI in the last 168 hours.  HbA1C: No results found for: HGBA1C  CBG: No results for input(s): GLUCAP in the last 168 hours.   Past Medical History   Past Medical History:  Diagnosis Date  . Arthritis   .  Asthma   . DDD (degenerative disc disease), lumbar   . Hernia of abdominal wall   . RLS (restless legs syndrome)       Surgical History    Past Surgical History:  Procedure Laterality Date  . HERNIA REPAIR    . SHOULDER SURGERY        Social History   Social History   Socioeconomic History  . Marital status: Single    Spouse name: Not on file  . Number of children: Not on file  . Years of education: Not on file  . Highest education level: Not on file  Occupational History  . Not on file  Tobacco Use  . Smoking status: Never Smoker  . Smokeless tobacco: Never Used  Vaping Use  . Vaping Use: Never used  Substance and Sexual Activity  . Alcohol use: Yes    Comment: 4x week  . Drug use: No  . Sexual activity: Not on file  Other Topics Concern  . Not on file  Social History Narrative  . Not on file   Social Determinants of Health   Financial Resource Strain: Not on file  Food Insecurity: Not on file  Transportation Needs: Not on file  Physical Activity: Not on file  Stress: Not  on file  Social Connections: Not on file      Family History    Family History  Problem Relation Age of Onset  . Hypertension Maternal Aunt    family history includes Hypertension in his maternal aunt.    Allergies Allergies  Allergen Reactions  . Cheese     Causes asthma  . Morphine And Related Rash      Current Medications  Current Facility-Administered Medications:  .  docusate sodium (COLACE) capsule 100 mg, 100 mg, Oral, BID PRN, Marcelle Smiling, MD .  heparin ADULT infusion 100 units/mL (25000 units/218mL), 1,000 Units/hr, Intravenous, Continuous, Maurice March, [facility], Last Rate: 10 mL/hr at 09/23/20 0542, 1,000 Units/hr at 09/23/20 0542 .  nitroGLYCERIN (NITROSTAT) SL tablet 0.4 mg, 0.4 mg, Sublingual, Q5 min PRN, Horton, Kristie M, DO, 0.4 mg at 09/23/20 0323 .  nitroGLYCERIN 50 mg in dextrose 5 % 250 mL (0.2 mg/mL) infusion, 0-200 mcg/min, Intravenous, Continuous, Horton, Kristie M, DO, Last Rate: 4.5 mL/hr at 09/23/20 0631, 15 mcg/min at 09/23/20 0631 .  ondansetron (ZOFRAN) injection 4 mg, 4 mg, Intravenous, Q6H PRN, Marcelle Smiling, MD .  polyethylene glycol (MIRALAX / GLYCOLAX) packet 17 g, 17 g, Oral, Daily PRN, Marcelle Smiling, MD  Current Outpatient Medications:  .  atorvastatin (LIPITOR) 80 MG tablet, Take 80 mg by mouth daily., Disp: , Rfl:  .  benzonatate (TESSALON) 100 MG capsule, Take 100 mg by mouth 3 (three) times daily as needed for cough., Disp: , Rfl:  .  budesonide-formoterol (SYMBICORT) 160-4.5 MCG/ACT inhaler, Inhale 2 puffs into the lungs 2 (two) times daily., Disp: , Rfl:  .  clonazePAM (KLONOPIN) 1 MG tablet, Take 1 mg by mouth 2 (two) times daily., Disp: , Rfl:  .  finasteride (PROSCAR) 5 MG tablet, Take 5 mg by mouth daily., Disp: , Rfl:  .  FLUoxetine (PROZAC) 20 MG capsule, Take 20 mg by mouth daily., Disp: , Rfl:  .  fluticasone (FLONASE) 50 MCG/ACT nasal spray, Place 1 spray into both nostrils daily., Disp: , Rfl:  .  gabapentin  (NEURONTIN) 400 MG capsule, Take 400-800 mg by mouth 3 (three) times daily. Take 1 capsule BID and Take 2 capsules QHS, Disp: , Rfl:  .  Ipratropium-Albuterol (COMBIVENT RESPIMAT) 20-100 MCG/ACT AERS respimat, Inhale 1 puff into the lungs every 6 (six) hours., Disp: , Rfl:  .  lidocaine (LIDODERM) 5 %, Place 1 patch onto the skin daily. Remove & Discard patch within 12 hours or as directed by MD, Disp: , Rfl:  .  rOPINIRole (REQUIP) 2 MG tablet, Take 4 mg by mouth 3 (three) times daily., Disp: , Rfl:  .  tamsulosin (FLOMAX) 0.4 MG CAPS capsule, Take 0.4 mg by mouth daily., Disp: , Rfl:  .  triamterene-hydrochlorothiazide (MAXZIDE-25) 37.5-25 MG tablet, Take 1 tablet by mouth daily., Disp: , Rfl:    Home Medications  Prior to Admission medications   Medication Sig Start Date End Date Taking? Authorizing Provider  atorvastatin (LIPITOR) 80 MG tablet Take 80 mg by mouth daily.    [provider]  benzonatate (TESSALON) 100 MG capsule Take 100 mg by mouth 3 (three) times daily as needed for cough.    [provider]  budesonide-formoterol (SYMBICORT) 160-4.5 MCG/ACT inhaler Inhale 2 puffs into the lungs 2 (two) times daily.    [provider]  clonazePAM (KLONOPIN) 1 MG tablet Take 1 mg by mouth 2 (two) times daily.    [provider]  finasteride (PROSCAR) 5 MG tablet Take 5 mg by mouth daily.    [provider]  FLUoxetine (PROZAC) 20 MG capsule Take 20 mg by mouth daily.    [provider]  fluticasone (FLONASE) 50 MCG/ACT nasal spray Place 1 spray into both nostrils daily.    [provider]  gabapentin (NEURONTIN) 400 MG capsule Take 400-800 mg by mouth 3 (three) times daily. Take 1 capsule BID and Take 2 capsules QHS    [provider]  Ipratropium-Albuterol (COMBIVENT RESPIMAT) 20-100 MCG/ACT AERS respimat Inhale 1 puff into the lungs every 6 (six) hours.    [provider]  lidocaine (LIDODERM) 5 % Place 1 patch  onto the skin daily. Remove & Discard patch within 12 hours or as directed by MD    [provider]  rOPINIRole (REQUIP) 2 MG tablet Take 4 mg by mouth 3 (three) times daily.    [provider]  tamsulosin (FLOMAX) 0.4 MG CAPS capsule Take 0.4 mg by mouth daily.    [provider]  triamterene-hydrochlorothiazide (MAXZIDE-25) 37.5-25 MG tablet Take 1 tablet by mouth daily.    [provider]      Critical care time: 45 minutes.  The treatment and management of the patient's condition was required based on the threat of imminent deterioration. This time reflects time spent by the physician evaluating, providing care and managing the critically ill patient's care. The time was spent at the immediate bedside (or on the same floor/unit and dedicated to this patient's care). Time involved in separately billable procedures is NOT included int he critical care time indicated above. Family meeting and update time may be included above if and only if the patient is unable/incompetent to participate in clinical interview and/or decision making, and the discussion was necessary to determining treatment decisions.   Marcelle Smiling, MD Board Certified by the ABIM, Pulmonary Diseases & Critical Care Medicine

## 2020-09-23 NOTE — Anesthesia Procedure Notes (Signed)
Procedure Name: Intubation Date/Time: 09/23/2020 8:53 AM Performed by: Aundria Rud, CRNA Pre-anesthesia Checklist: Patient identified, Emergency Drugs available, Suction available and Patient being monitored Patient Re-evaluated:Patient Re-evaluated prior to induction Oxygen Delivery Method: Circle System Utilized Preoxygenation: Pre-oxygenation with 100% oxygen Induction Type: IV induction, Rapid sequence and Cricoid Pressure applied Laryngoscope Size: Glidescope Grade View: Grade I Tube type: Oral Tube size: 7.5 mm Number of attempts: 1 Airway Equipment and Method: Stylet and Video-laryngoscopy Placement Confirmation: ETT inserted through vocal cords under direct vision,  positive ETCO2 and breath sounds checked- equal and bilateral Tube secured with: Tape Dental Injury: Teeth and Oropharynx as per pre-operative assessment  Comments: Placed in ED by MD

## 2020-09-23 NOTE — Brief Op Note (Addendum)
09/23/2020  11:08 AM  PATIENT:  Jack Vance  80 y.o. male  PRE-OPERATIVE DIAGNOSIS:  CAD  POST-OPERATIVE DIAGNOSIS:  CAD  PROCEDURE:  Procedure(s): CORONARY ARTERY BYPASS GRAFTING (CABG)  TIMES 4 USING LEFT GREATER SAPHENOUS VEIN HARVESTED ENDOSCOPICALLY AND LEFT INTERNAL MAMMARY ARTERY (N/A) TRANSESOPHAGEAL ECHOCARDIOGRAM (TEE) (N/A) LIMA-LAD SVG-OM1 SVG-DIST RCA   SURGEON:  Surgeon(s) and Role:    * Lightfoot, Eliezer Lofts, MD - Primary  PHYSICIAN ASSISTANT: Millissa Deese PA-C, MYRON RODENBERRY  PA-C  ASSISTANTS: STAFF   ANESTHESIA:   general  EBL:  2500 mL   BLOOD ADMINISTERED:SEE ANESTHESIA AND PERFUSION RECORDS  DRAINS: LEFT PLEURAL AND MEDIASTINAL CHEST TUBES   LOCAL MEDICATIONS USED:  NONE  SPECIMEN:  No Specimen  DISPOSITION OF SPECIMEN:  N/A  COUNTS:  YES  TOURNIQUET:  * No tourniquets in log *  DICTATION: .Dragon Dictation  PLAN OF CARE: Admit to inpatient   PATIENT DISPOSITION:  ICU - intubated and hemodynamically stable.   Delay start of Pharmacological VTE agent (>24hrs) due to surgical blood loss or risk of bleeding: yes  COMPLICATIONS: NO KNOWN

## 2020-09-23 NOTE — ED Notes (Signed)
Lab called to notify that labs are STAT and needed back quickly

## 2020-09-23 NOTE — ED Notes (Signed)
Pt states he feels less short of breath after breathing treatment

## 2020-09-23 NOTE — Progress Notes (Signed)
[facility] BRIEF SICU PROGRESS NOTE  Day of Surgery  S/P Procedure(s) (LRB): CORONARY ARTERY BYPASS GRAFTING (CABG)  TIMES 4 USING LEFT GREATER SAPHENOUS VEIN HARVESTED ENDOSCOPICALLY AND LEFT INTERNAL MAMMARY ARTERY (N/A) TRANSESOPHAGEAL ECHOCARDIOGRAM (TEE) (N/A)   Sedated on vent AV paced w/ stable hemodynamics IABP 1:1 on Epi and Levophed O2 sats 100% Chest tube output low UOP > 100 mL/hr Labs and CXR pending  Plan: Continue routine early postop  Purcell Nails, MD 09/23/2020 6:38 PM

## 2020-09-23 NOTE — ED Notes (Signed)
Pt unintentionally removed IV. Another IV will be started

## 2020-09-23 NOTE — ED Notes (Signed)
V/o from Dr. Wilkie Aye to hold on nitro drip

## 2020-09-23 NOTE — ED Notes (Signed)
Pt states improvement in breathing with BIPAP

## 2020-09-23 NOTE — ED Notes (Signed)
Pt states no relief after nitroglycerin

## 2020-09-23 NOTE — ED Provider Notes (Signed)
Patient initially seen by Dr. Wilkie Aye.  Please see her note.  Dr. Jacinto Halim is currently at the bedside evaluating the patient.  Patient started to have increasing work of breathing.  He was becoming more distressed.  Dr. Jacinto Halim felt the patient need to be intubated.  On my exam the patient is having significant work of breathing.  He is in distress despite BiPAP.  Agree with intubation.  Procedure Name: Intubation Date/Time: 09/23/2020 8:53 AM Performed by: Linwood Dibbles, MD Pre-anesthesia Checklist: Patient identified, Patient being monitored, Emergency Drugs available, Timeout performed and Suction available Oxygen Delivery Method: Non-rebreather mask Preoxygenation: Pre-oxygenation with 100% oxygen Induction Type: Rapid sequence Ventilation: Mask ventilation without difficulty Grade View: Grade I Tube size: 7.5 mm Number of attempts: 1 Airway Equipment and Method: Video-laryngoscopy Placement Confirmation: ETT inserted through vocal cords under direct vision,  Breath sounds checked- equal and bilateral and CO2 detector Tube secured with: ETT holder Comments: Brief episode of decreased O2 sat, 80s after intubation, approx 15 seconds.  Improved with cuff properly inflated and bag valve ventilation         Linwood Dibbles, MD 09/23/20 [phone]

## 2020-09-23 NOTE — Anesthesia Preprocedure Evaluation (Addendum)
Anesthesia Evaluation  Patient identified by MRN, date of birth, ID band  Reviewed: Allergy & Precautions, Patient's Chart, lab work & pertinent test results  Airway Mallampati: Intubated  TM Distance: >3 FB     Dental   Pulmonary asthma ,    breath sounds clear to auscultation   + intubated    Cardiovascular hypertension, + CAD, + Past MI and +CHF   Rhythm:Regular Rate:Normal  ECHO: 1. Left ventricular ejection fraction, by estimation, is <20%. The left ventricle has severely decreased function. Severe global hypokinesis with akinesis of anterolateral wall. Grade II diastolic dysfunction (pseudonormalization). Elevated left atrial pressure. 2. Right ventricular systolic function is moderately reduced. The right ventricular size is normal. There is moderately elevated pulmonary artery systolic pressure. 3. Left atrial size was mildly dilated. 4. The mitral valve is grossly normal. Mild to moderate mitral valve regurgitation. 5. Tricuspid valve regurgitation is moderate. Peak RA-RV gradient 41 mmHg.   Neuro/Psych PSYCHIATRIC DISORDERS negative neurological ROS     GI/Hepatic negative GI ROS, Neg liver ROS,   Endo/Other  negative endocrine ROS  Renal/GU negative Renal ROS     Musculoskeletal negative musculoskeletal ROS (+)   Abdominal   Peds  Hematology  (+) anemia , HLD   Anesthesia Other Findings CAD  Reproductive/Obstetrics                          Anesthesia Physical Anesthesia Plan  ASA: IV and emergent  Anesthesia Plan: General   Post-op Pain Management:    Induction: Intravenous  PONV Risk Score and Plan: 2 and Midazolam and Treatment may vary due to age or medical condition  Airway Management Planned: Oral ETT  Additional Equipment: Arterial line, CVP, PA Cath, TEE and Ultrasound Guidance Line Placement  Intra-op Plan:   Post-operative Plan: Post-operative  intubation/ventilation  Informed Consent:   Plan Discussed with: Anesthesiologist, CRNA and Surgeon  Anesthesia Plan Comments:       Anesthesia Quick Evaluation

## 2020-09-23 NOTE — ED Provider Notes (Addendum)
[facility] DEPT Provider Note   CSN: [id number] Arrival date & time: 09/23/20  0240     History No chief complaint on file.   Jack Vance is a 80 y.o. male.  HPI   80 year old male with past medical history of HTN, HLD, asthma, anemia presents to the emergency department with shortness of breath and burning chest pain.  Patient is very hard of hearing, history limited secondary to that.  Patient states the burning chest discomfort has been going on constantly for the last 2 days.  He is had intermittent shortness of breath that became more severe this evening.  Denies any significant leg swelling.  No history of CAD/aneurysm.  No history of DVT/PE risk factors.  No fever or cough.  Past Medical History:  Diagnosis Date  . Arthritis   . Asthma   . DDD (degenerative disc disease), lumbar   . Hernia of abdominal wall   . RLS (restless legs syndrome)     Patient Active Problem List   Diagnosis Date Noted  . Insomnia 05/04/2020  . Ruptured appendicitis 12/04/2017  . Restless leg syndrome 12/04/2017  . Normocytic normochromic anemia 12/04/2017  . Asthma 12/04/2017  . HLD (hyperlipidemia) 12/04/2017  . Abdominal wall abscess 12/04/2017  . Abdominal visceral abscess ([place]) 12/03/2017    Past Surgical History:  Procedure Laterality Date  . HERNIA REPAIR    . SHOULDER SURGERY         Family History  Problem Relation Age of Onset  . Hypertension Maternal Aunt     Social History   Tobacco Use  . Smoking status: Never Smoker  . Smokeless tobacco: Never Used  Vaping Use  . Vaping Use: Never used  Substance Use Topics  . Alcohol use: Yes    Comment: 4x week  . Drug use: No    Home Medications Prior to Admission medications   Medication Sig Start Date End Date Taking? Authorizing Provider  atorvastatin (LIPITOR) 80 MG tablet Take 80 mg by mouth daily.    [provider]  benzonatate (TESSALON) 100 MG capsule Take 100 mg by  mouth 3 (three) times daily as needed for cough.    [provider]  budesonide-formoterol (SYMBICORT) 160-4.5 MCG/ACT inhaler Inhale 2 puffs into the lungs 2 (two) times daily.    [provider]  clonazePAM (KLONOPIN) 1 MG tablet Take 1 mg by mouth 2 (two) times daily.    [provider]  finasteride (PROSCAR) 5 MG tablet Take 5 mg by mouth daily.    [provider]  FLUoxetine (PROZAC) 20 MG capsule Take 20 mg by mouth daily.    [provider]  fluticasone (FLONASE) 50 MCG/ACT nasal spray Place 1 spray into both nostrils daily.    [provider]  gabapentin (NEURONTIN) 400 MG capsule Take 400-800 mg by mouth 3 (three) times daily. Take 1 capsule BID and Take 2 capsules QHS    [provider]  Ipratropium-Albuterol (COMBIVENT RESPIMAT) 20-100 MCG/ACT AERS respimat Inhale 1 puff into the lungs every 6 (six) hours.    [provider]  lidocaine (LIDODERM) 5 % Place 1 patch onto the skin daily. Remove & Discard patch within 12 hours or as directed by MD    [provider]  rOPINIRole (REQUIP) 2 MG tablet Take 4 mg by mouth 3 (three) times daily.    [provider]  tamsulosin (FLOMAX) 0.4 MG CAPS capsule Take 0.4 mg by mouth daily.  [provider]  triamterene-hydrochlorothiazide (MAXZIDE-25) 37.5-25 MG tablet Take 1 tablet by mouth daily.    [provider]    Allergies    Cheese and Morphine and related  Review of Systems   Review of Systems  Constitutional: Negative for chills and fever.  HENT: Negative for congestion.   Eyes: Negative for visual disturbance.  Respiratory: Positive for shortness of breath. Negative for cough.   Cardiovascular: Positive for chest pain. Negative for palpitations and leg swelling.  Gastrointestinal: Negative for abdominal pain, diarrhea and vomiting.  Genitourinary: Negative for dysuria.  Skin: Negative for rash.  Neurological: Negative for  headaches.    Physical Exam Updated Vital Signs BP (!) 137/91   Pulse (!) 103   Temp 97.7 F (36.5 C) (Oral)   Resp (!) 24   Ht _0  (1.676 m)   Wt 83 kg   SpO2 98%   BMI 29.54 kg/m   Physical Exam Vitals and nursing note reviewed.  Constitutional:      Appearance: Normal appearance.  HENT:     Head: Normocephalic.     Mouth/Throat:     Mouth: Mucous membranes are moist.  Cardiovascular:     Rate and Rhythm: Tachycardia present.     Pulses: Normal pulses.  Pulmonary:     Effort: Pulmonary effort is normal. No respiratory distress.     Breath sounds: Wheezing present.  Abdominal:     Palpations: Abdomen is soft.     Tenderness: There is no abdominal tenderness.  Musculoskeletal:     Comments: Trace bilateral ankle edema  Skin:    General: Skin is warm.  Neurological:     Mental Status: He is alert and oriented to person, place, and time. Mental status is at baseline.  Psychiatric:        Mood and Affect: Mood normal.     ED Results / Procedures / Treatments   Labs (all labs ordered are listed, but only abnormal results are displayed) Labs Reviewed  CBC WITH DIFFERENTIAL/PLATELET - Abnormal; Notable for the following components:      Result Value   WBC 14.5 (*)    RBC 3.06 (*)    Hemoglobin 7.5 (*)    HCT 24.7 (*)    MCH 24.5 (*)    RDW 16.5 (*)    Neutro Abs 12.5 (*)    Lymphs Abs 0.6 (*)    Monocytes Absolute 1.3 (*)    All other components within normal limits  RESP PANEL BY RT-PCR (FLU A&B, COVID) ARPGX2  PROTIME-INR  COMPREHENSIVE METABOLIC PANEL  BRAIN NATRIURETIC PEPTIDE  TROPONIN I (HIGH SENSITIVITY)    EKG None  Radiology No results found.  Procedures .Critical Care Performed by: Lorelle Gibbs, DO Authorized by: Lorelle Gibbs, DO   Critical care provider statement:    Critical care time (minutes):  90   Critical care was necessary to treat or prevent imminent or life-threatening deterioration of the following conditions:   Respiratory failure and cardiac failure   Critical care was time spent personally by me on the following activities:  Discussions with consultants, evaluation of patient's response to treatment, examination of patient, ordering and performing treatments and interventions, ordering and review of laboratory studies, ordering and review of radiographic studies, pulse oximetry, re-evaluation of patient's condition, obtaining history from patient or surrogate and review of old charts   I assumed direction of critical care for this patient from another provider in my specialty: no  Medications Ordered in ED Medications  nitroGLYCERIN (NITROSTAT) SL tablet 0.4 mg (0.4 mg Sublingual Given 09/23/20 0323)  aspirin tablet 325 mg (325 mg Oral Given 09/23/20 0313)  ipratropium-albuterol (DUONEB) 0.5-2.5 (3) MG/3ML nebulizer solution 3 mL (3 mLs Nebulization Given 09/23/20 0337)  methylPREDNISolone sodium succinate (SOLU-MEDROL) 125 mg/2 mL injection 125 mg (125 mg Intravenous Given 09/23/20 0102)    ED Course  I have reviewed the triage vital signs and the nursing notes.  Pertinent labs & imaging results that were available during my care of the patient were reviewed by me and considered in my medical decision making (see chart for details).    MDM Rules/Calculators/A&P                          42-year-old male presents the emergency department with concern for burning chest pain and shortness of breath.  Patient is very hard of hearing so history is limited.  He admits to constant burning in his chest over the last day, more severe tonight with shortness of breath.  Initial arrival EKG has artifact and uneven baseline but concern for aVR ST elevation with ST depression.  Concerning for ischemic cardiac disease.  Repeat EKG done at 301 confirms these findings.  No STEMI criteria but with ongoing chest pain and EKG changes STEMI team was paged at 306.  Of note patient does have diffuse wheezing, breathing  treatment started.  Aspirin given, no significant improvement with first nitroglycerin.  Equal palpable radial pulses, no tearing pain through his back/abdomen.  357: Spoke with on-call cardiology fellow Dr. Samul Dada who reviewed the EKG with me.  STEMI criteria is not met, cardiology recommends evaluating lab work for electrolyte abnormalities and troponin as well as treating chest pain. No plan for cath lab. Nitroglycerin drip ordered.  Blood work shows a worse than baseline anemia.  Fecal occult is negative.  Blood work also shows an elevated BNP over thousand and a first troponin over 7000. Cardiology recommends heparin. CXR shows multifocal opacities throughout both lungs which could be infection and/or asymmetric edema with prominent cardiac silhouette and effusion/pulmonary vascular congestion.  Seems most likely CHF.  Patient continues to be tachypneic with bilateral rales, dose of Lasix given without significant improvement.  On reevaluation patient is tachypneic with increased work of breathing.  Decision made to place the patient on BiPAP.  ICU consult placed.  ICU will admit the patient, Dr. Carson Myrtle.  Cardiologist Dr. Einar Gip has evaluated the patient's chart and given his recommendations.  Patient is much improved on BiPAP, plan for ICU admission and continue diuresis.  Patients evaluation and results requires admission for further treatment and care. Patient agrees with admission plan, offers no new complaints and is stable/unchanged at time of admit.  Final Clinical Impression(s) / ED Diagnoses Final diagnoses:  None    Rx / DC Orders ED Discharge Orders    None       Lorelle Gibbs, DO 09/23/20 7253    Lorelle Gibbs, DO 09/23/20 [place], Alvin Critchley, DO 09/23/20 [id number]

## 2020-09-23 NOTE — Transfer of Care (Signed)
Immediate Anesthesia Transfer of Care Note  Patient: Jack Vance  Procedure(s) Performed: CORONARY ARTERY BYPASS GRAFTING (CABG)  TIMES 4 USING LEFT GREATER SAPHENOUS VEIN HARVESTED ENDOSCOPICALLY AND LEFT INTERNAL MAMMARY ARTERY (N/A Chest) TRANSESOPHAGEAL ECHOCARDIOGRAM (TEE) (N/A )  Patient Location: SICU  Anesthesia Type:General  Level of Consciousness: Patient remains intubated per anesthesia plan  Airway & Oxygen Therapy: Patient remains intubated per anesthesia plan and Patient placed on Ventilator (see vital sign flow sheet for setting)  Post-op Assessment: Report given to RN and Post -op Vital signs reviewed and stable  Post vital signs: Reviewed and stable  Last Vitals:  Vitals Value Taken Time  BP 115/37   Temp    Pulse 76 09/23/20 1823  Resp 13 09/23/20 1824  SpO2 93 % 09/23/20 1824  Vitals shown include unvalidated device data.  Last Pain:  Vitals:   09/23/20 0323  TempSrc:   PainSc: 6          Complications: No complications documented.

## 2020-09-23 NOTE — ED Notes (Signed)
Pt to X-Ray via stretcher 

## 2020-09-23 NOTE — Consult Note (Signed)
[address]       [place] [address]             [phone]        Jack Vance [facility] Medical Record #[id number] Date of Birth: 1940-06-28  Referring: No ref. provider found Primary Care: Clinic, Jack Vance Primary Cardiologist:None  Chief Complaint:   No chief complaint on file.   History of Present Illness:     80 year old male transferred from [facility] following an NSTEMI and cardiogenic shock.  Records are obtained from chart review and discussion with primary physicians.  He underwent a left heart cath which showed severe left main disease as well as ostial right coronary artery disease, and his echocardiogram shows reduced biventricular function.  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  An intra-aortic balloon pump was placed in the Cath Lab and the patient is being brought emergently to the OR for revascularization.     Past Medical History:  Diagnosis Date  . Arthritis   . Asthma   . DDD (degenerative disc disease), lumbar   . Hernia of abdominal wall   . RLS (restless legs syndrome)     Past Surgical History:  Procedure Laterality Date  . HERNIA REPAIR    . SHOULDER SURGERY       Social History   Tobacco Use  Smoking Status Never Smoker  Smokeless Tobacco Never Used    Social History   Substance and Sexual Activity  Alcohol Use Yes   Comment: 4x week     Allergies  Allergen Reactions  . Cheese     Causes asthma  . Morphine And Related Rash     Current Facility-Administered Medications  Medication Dose Route Frequency Provider Last Rate Last Admin  . 0.9 %  sodium chloride infusion    Continuous PRN Yates Decamp, MD 10 mL/hr at 09/23/20 1028 10 mL/hr at 09/23/20 1028  . ceFAZolin (ANCEF) IVPB 2g/100 mL premix  2 g Intravenous To OR Shemicka Cohrs O, MD      . ceFAZolin (ANCEF) IVPB 2g/100 mL premix  2 g Intravenous To OR  Candia Kingsbury O, MD      . dexmedetomidine (PRECEDEX) 400 MCG/100ML (4 mcg/mL) infusion  0.1-0.7 mcg/kg/hr Intravenous To OR Alissandra Geoffroy, Eliezer Lofts, MD      . Mitzi Hansen Hold] docusate sodium (COLACE) capsule 100 mg  100 mg Oral BID PRN Marcelle Smiling, MD      . EPINEPHrine (ADRENALIN) 4 mg in NS 250 mL (0.016 mg/mL) premix infusion  0-10 mcg/min Intravenous To OR Roniesha Hollingshead, Eliezer Lofts, MD      . Mitzi Hansen Hold] fentaNYL (SUBLIMAZE) bolus via infusion 25 mcg  25 mcg Intravenous Q1H PRN Linwood Dibbles, MD      . fentaNYL in NS (3mcg/ml) infusion-PREMIX  25-400 mcg/hr Intravenous Continuous Linwood Dibbles, MD 10 mL/hr at 09/23/20 0940 100 mcg/hr at 09/23/20 0940  . [MAR Hold] furosemide (LASIX) injection 40 mg  40 mg Intravenous STAT Yates Decamp, MD      . furosemide (LASIX) injection    PRN Yates Decamp, MD   80 mg at 09/23/20 1111  . heparin 30,000 units/NS 1000 mL solution for CELLSAVER   Other To OR Doralee Kocak O, MD      . heparin ADULT infusion 100 units/mL (25000 units/249mL)  1,000 Units/hr Intravenous Continuous Maurice March, RPH 10 mL/hr at  09/23/20 0542 1,000 Units/hr at 09/23/20 0542  . heparin sodium (porcine) 2,500 Units, papaverine 30 mg in electrolyte-148 (PLASMALYTE-148) 500 mL irrigation   Irrigation To OR Harlym Gehling O, MD      . heparin sodium (porcine) injection    PRN Yates Decamp, MD   3,000 Units at 09/23/20 1118  . insulin regular, human (MYXREDLIN) 100 units/ 100 mL infusion   Intravenous To OR Kelce Bouton, Eliezer Lofts, MD      . Maryagnes Amos Blood Cardioplegia vial (lidocaine/magnesium/mannitol 0.26g-4g-6.4g)   Intracoronary Once Jearlean Demauro, Eliezer Lofts, MD      . lidocaine (PF) (XYLOCAINE) 1 % injection    PRN Yates Decamp, MD   30 mL at 09/23/20 1019  . midazolam (VERSED) injection    PRN Yates Decamp, MD   2 mg at 09/23/20 1150  . milrinone (PRIMACOR) 20 MG/100 ML (0.2 mg/mL) infusion  0.3 mcg/kg/min Intravenous To OR Jaiya Mooradian, Eliezer Lofts, MD      . Mitzi Hansen Hold]  nitroGLYCERIN (NITROSTAT) SL tablet 0.4 mg  0.4 mg Sublingual Q5 min PRN Horton, Kristie M, DO   0.4 mg at 09/23/20 1275  . nitroGLYCERIN 50 mg in dextrose 5 % 250 mL (0.2 mg/mL) infusion  0-200 mcg/min Intravenous Continuous Horton, Clabe Seal, DO 6 mL/hr at 09/23/20 0714 20 mcg/min at 09/23/20 0714  . nitroGLYCERIN 50 mg in dextrose 5 % 250 mL (0.2 mg/mL) infusion  2-200 mcg/min Intravenous To OR Duvid Smalls O, MD      . norepinephrine (LEVOPHED) 4mg  in premix infusion  0-40 mcg/min Intravenous To OR Amir Glaus, Eliezer Lofts, MD      . Mitzi Hansen Hold] ondansetron (ZOFRAN) injection 4 mg  4 mg Intravenous Q6H PRN Marcelle Smiling, MD      . phenylephrine (NEOSYNEPHRINE) 20-0.9 MG/250ML-% infusion  30-200 mcg/min Intravenous To OR Shanece Cochrane, Eliezer Lofts, MD      . Mitzi Hansen Hold] polyethylene glycol (MIRALAX / GLYCOLAX) packet 17 g  17 g Oral Daily PRN Marcelle Smiling, MD      . potassium chloride injection 80 mEq  80 mEq Other To OR Annamary Buschman O, MD      . propofol (DIPRIVAN) 1000 MG/100ML infusion  0-50 mcg/kg/min Intravenous Continuous Linwood Dibbles, MD      . tranexamic acid (CYKLOKAPRON) 2,500 mg in sodium chloride 0.9 % 250 mL (10 mg/mL) infusion  1.5 mg/kg/hr Intravenous To OR Kirstein Baxley O, MD      . tranexamic acid (CYKLOKAPRON) bolus via infusion - over 30 minutes 1,245 mg  15 mg/kg Intravenous To OR Jaylena Holloway O, MD      . tranexamic acid (CYKLOKAPRON) pump prime solution 166 mg  2 mg/kg Intracatheter To OR Derrion Tritz O, MD      . vancomycin (VANCOREADY) IVPB 1250 mg/250 mL  1,250 mg Intravenous To OR Jen Benedict, Eliezer Lofts, MD        Medications Prior to Admission  Medication Sig Dispense Refill Last Dose  . acetaminophen (TYLENOL) 325 MG tablet Take 650 mg by mouth every 4 (four) hours as needed for pain.   unk  . aspirin EC 81 MG tablet Take 81 mg by mouth daily. Swallow whole.   09/22/2020 at Unknown time  . atorvastatin (LIPITOR) 80 MG tablet Take 80 mg by  mouth daily.   09/22/2020 at Unknown time  . benzonatate (TESSALON) 100 MG capsule Take 100 mg by mouth 3 (three) times daily as needed for cough.   Past Week at Unknown time  . diclofenac Sodium (  VOLTAREN) 1 % GEL Apply 2 g topically 2 (two) times daily as needed for pain.   Past Week at Unknown time  . ferrous gluconate (FERGON) 324 MG tablet Take 324 mg by mouth daily.   09/22/2020 at Unknown time  . finasteride (PROSCAR) 5 MG tablet Take 5 mg by mouth daily.   09/22/2020 at Unknown time  . FLUoxetine (PROZAC) 20 MG capsule Take 20 mg by mouth daily.   09/22/2020 at Unknown time  . fluticasone (FLONASE) 50 MCG/ACT nasal spray Place 1 spray into both nostrils daily.   Past Week at Unknown time  . fluticasone-salmeterol (ADVAIR) 250-50 MCG/ACT AEPB Inhale 1 puff into the lungs 2 (two) times daily.   09/22/2020 at Unknown time  . ibuprofen (ADVIL) 200 MG tablet Take 400 mg by mouth every 6 (six) hours as needed for mild pain.   09/22/2020 at Unknown time  . Ipratropium-Albuterol (COMBIVENT) 20-100 MCG/ACT AERS respimat Inhale 1 puff into the lungs every 6 (six) hours.   09/22/2020 at Unknown time  . lidocaine (LIDODERM) 5 % Place 1 patch onto the skin daily. Remove & Discard patch within 12 hours or as directed by MD   Past Week at Unknown time  . montelukast (SINGULAIR) 10 MG tablet Take 10 mg by mouth daily.   09/22/2020 at Unknown time  . Multiple Vitamin (MULTIVITAMIN) capsule Take 1 capsule by mouth daily.   09/22/2020 at Unknown time  . omeprazole (PRILOSEC) 40 MG capsule Take 40 mg by mouth daily.   09/22/2020 at Unknown time  . rOPINIRole (REQUIP) 2 MG tablet Take 4 mg by mouth 3 (three) times daily.   09/22/2020 at Unknown time  . tamsulosin (FLOMAX) 0.4 MG CAPS capsule Take 0.4 mg by mouth daily.   09/22/2020 at Unknown time  . Tiotropium Bromide Monohydrate 2.5 MCG/ACT AERS Inhale 2 puffs into the lungs daily.   09/22/2020 at Unknown time  . traMADol (ULTRAM) 50 MG tablet Take 1 tablet by mouth 3 (three) times  daily as needed for pain.   unk  . triamterene-hydrochlorothiazide (MAXZIDE-25) 37.5-25 MG tablet Take 1 tablet by mouth daily.   09/22/2020 at Unknown time    Family History  Problem Relation Age of Onset  . Hypertension Maternal Aunt      Review of Systems:   Review of Systems  Unable to perform ROS: Intubated      Physical Exam: BP (!) 164/101   Pulse (!) 106   Temp 97.7 F (36.5 C) (Oral)   Resp (!) 22   Ht 5\' 6"  (1.676 m)   Wt 83 kg   SpO2 99%   BMI 29.54 kg/m  Physical Exam Cardiovascular:     Rate and Rhythm: Tachycardia present.  Abdominal:     Comments: Large ventral hernia       Diagnostic Studies & Laboratory data:    Left Heart Catherization: LM stenosis, and ostial RCA disease.  Good targets for revascularization Echo: Reduce biventricular function.  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  Reduced biventricular function.  OR today for emergent CABG.  Low threshold for [facility] ecmo.     I  spent 55 minutes counseling the patient face to face.   Jack Vance 09/23/2020 11:52 AM

## 2020-09-23 NOTE — ED Notes (Signed)
RT called for ABG.

## 2020-09-23 NOTE — Op Note (Signed)
[address]       [name] [address]             [phone]                                          09/23/2020 Patient:  Jack Vance Pre-Op Dx: Left main/three-vessel coronary artery disease   NSTEMI   Decompensated heart failure   Cardiogenic shock   Hypertension   Morbid obesity Post-op Dx: Same Procedure: CABG X 3, LIMA LAD, reversesaphenousveingraft to distal right, reverse saphenous vein graft to ramus Endoscopic greater saphenous vein harvest on the left   Surgeon and Role:      * Corliss Skains, MD - Primary    *M. Lewayne Bunting- assisting Assistant: Webb Laws, PA-C  Anesthesia  general EBL: 500 ml Blood Administration: 3 units of packed red cells   Drains: 19 F blake drain: R, L, mediastinal  Wires: None Counts: correct   Indications: 80 year old male transferred from [facility] following an NSTEMI and cardiogenic shock.  Records are obtained from chart review and discussion with primary physicians.  He underwent a left heart cath which showed severe left main disease as well as ostial right coronary artery disease, and his echocardiogram shows reduced biventricular function.  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  An intra-aortic balloon pump was placed in the Cath Lab and the patient is being brought emergently to the OR for revascularization  Findings: Good LIMA, very small vein grafts.  Good LAD target.  Circumflex vessel was deep in the AV groove.  On review of the left heart cath there was a high diagonal/ramus intermedius that covered part of the lateral territory.  There was a much better target the small obtuse marginal that was closer to the inferior wall.  He separated from cardiopulmonary bypass without incident off of that intra-aortic balloon pump.  The balloon pump was restarted prior to closure since it was placed.  The patient did not  require this mechanical support however.  We left the room micrograms of Levophed.  Operative Technique: All invasive lines were placed in pre-op holding.  After the risks, benefits and alternatives were thoroughly discussed, the patient was brought to the operative theatre.  Anesthesia was induced, and the patient was prepped and draped in normal sterile fashion.  An appropriate surgical pause was performed, and pre-operative antibiotics were dosed accordingly.  We began with simultaneous incisions along the left leg for harvesting of the greater saphenous vein and the chest for the sternotomy.  In regards to the sternotomy, this was carried down with bovie cautery, and the sternum was divided with a reciprocating saw.  Meticulous hemostasis was obtained.  The left internal thoracic artery was exposed and harvested in in pedicled fashion.  The patient was systemically heparinized, and the artery was divided distally, and placed in a papaverine sponge.    The sternal elevator was removed, and a retractor was placed.  The pericardium was divided in the midline and fashioned into a cradle with pericardial stitches.   After we confirmed an appropriate ACT, the ascending aorta was cannulated in standard fashion.  The right atrial appendage was used for venous cannulation site.  Cardiopulmonary bypass was initiated, and we remained warm and beating.  theHeart stabilizer was used to help expose the LAD.  I am end-to-side anastomosis with the LIMA was then performed.  The heart was then positioned to expose the inferior wall.  A good target on the distal RCA was identified and an end-to-side anastomosis with saphenous vein graft was used.  We then exposed the lateral wall but there was only a small obtuse marginal that was deep in the groove.  This was not a good target.  We attempted to expose the high diagonal/ramus vessel but this was difficult with the heart still beating.  I elected to arrest the heart  crossclamped the aorta at this point.  The cross clamp was applied, and a dose of anterograde cardioplegia was given with good arrest of the heart.   An arteriotomy was created in this vessel.  The vein was anastomosed in an end to side fashion.  We began to re-warm, and a re-animation dose of cardioplegia was given.  The heart was de-aired, and the cross clamp was removed.  Meticulous hemostasis was obtained.    A partial occludding clamp was then placed on the ascending aorta, and we created an end to side anastomosis between it and the proximal vein grafts.  The proximal sites were marked with rings.  Hemostasis was obtained, and we separated from cardiopulmonary bypass without event.the heparin was reversed with protamine.  Chest tubes and wires were placed, and the sternum was re-approximated with with sternal wires.  The soft tissue and skin were re-approximated wth absorbable suture.    The patient tolerated the procedure without any immediate complications, and was transferred to the ICU in guarded condition.  Jazzma Neidhardt Keane Scrape

## 2020-09-23 NOTE — Consult Note (Signed)
Advanced Heart Failure Team Consult Note   Primary Physician: [facility] PCP-Cardiologist:  None  Reason for Consultation: Cardiogenic shock   HPI:    Jack Vance is seen today for evaluation of cardiogenic shock at the request of Dr. Jacinto Halim.   Echo EF 20-25% RV ok   80 y/o male with asthma, obesity, previous ventral hernia repair. Presented to [facility] ER with respiratory failure. Found to be in cardiogenic shock with NSTEMI. ECG suggestive of LM disease. Intubated and brought to cath lab.   Cath confirmed critical CAD with 95% ostial LM, 90 Ostial LCX, 80% ostial RCA.    Review of Systems: unavailable due to intubation  Home Medications Prior to Admission medications   Medication Sig Start Date End Date Taking? Authorizing Provider  acetaminophen (TYLENOL) 325 MG tablet Take 650 mg by mouth every 4 (four) hours as needed for pain. 04/29/13  Yes [provider]  aspirin EC 81 MG tablet Take 81 mg by mouth daily. Swallow whole.   Yes [provider]  atorvastatin (LIPITOR) 80 MG tablet Take 80 mg by mouth daily.   Yes [provider]  benzonatate (TESSALON) 100 MG capsule Take 100 mg by mouth 3 (three) times daily as needed for cough.   Yes [provider]  diclofenac Sodium (VOLTAREN) 1 % GEL Apply 2 g topically 2 (two) times daily as needed for pain. 05/29/20  Yes [provider]  ferrous gluconate (FERGON) 324 MG tablet Take 324 mg by mouth daily. 09/13/20  Yes [provider]  finasteride (PROSCAR) 5 MG tablet Take 5 mg by mouth daily.   Yes [provider]  FLUoxetine (PROZAC) 20 MG capsule Take 20 mg by mouth daily.   Yes [provider]  fluticasone (FLONASE) 50 MCG/ACT nasal spray Place 1 spray into both nostrils daily.   Yes [provider]  fluticasone-salmeterol (ADVAIR) 250-50 MCG/ACT AEPB Inhale 1 puff into the lungs 2 (two) times daily. 09/07/20  Yes [provider]   ibuprofen (ADVIL) 200 MG tablet Take 400 mg by mouth every 6 (six) hours as needed for mild pain.   Yes [provider]  Ipratropium-Albuterol (COMBIVENT) 20-100 MCG/ACT AERS respimat Inhale 1 puff into the lungs every 6 (six) hours.   Yes [provider]  lidocaine (LIDODERM) 5 % Place 1 patch onto the skin daily. Remove & Discard patch within 12 hours or as directed by MD   Yes [provider]  montelukast (SINGULAIR) 10 MG tablet Take 10 mg by mouth daily. 04/05/20  Yes [provider]  Multiple Vitamin (MULTIVITAMIN) capsule Take 1 capsule by mouth daily.   Yes [provider]  omeprazole (PRILOSEC) 40 MG capsule Take 40 mg by mouth daily. 08/24/20  Yes [provider]  rOPINIRole (REQUIP) 2 MG tablet Take 4 mg by mouth 3 (three) times daily.   Yes [provider]  tamsulosin (FLOMAX) 0.4 MG CAPS capsule Take 0.4 mg by mouth daily.   Yes [provider]  Tiotropium Bromide Monohydrate 2.5 MCG/ACT AERS Inhale 2 puffs into the lungs daily. 09/12/20  Yes [provider]  traMADol (ULTRAM) 50 MG tablet Take 1 tablet by mouth 3 (three) times daily as needed for pain. 08/19/15  Yes [provider]  triamterene-hydrochlorothiazide (MAXZIDE-25) 37.5-25 MG tablet Take 1 tablet by mouth daily.   Yes [provider]    Past Medical History: Past Medical History:  Diagnosis Date  . Arthritis   .  Asthma   . DDD (degenerative disc disease), lumbar   . Hernia of abdominal wall   . RLS (restless legs syndrome)     Past Surgical History: Past Surgical History:  Procedure Laterality Date  . HERNIA REPAIR    . SHOULDER SURGERY      Family History: Family History  Problem Relation Age of Onset  . Hypertension Maternal Aunt     Social History: Social History   Socioeconomic History  . Marital status: Single    Spouse name: Not on file  . Number of children: Not on file  . Years of education: Not  on file  . Highest education level: Not on file  Occupational History  . Not on file  Tobacco Use  . Smoking status: Never Smoker  . Smokeless tobacco: Never Used  Vaping Use  . Vaping Use: Never used  Substance and Sexual Activity  . Alcohol use: Yes    Comment: 4x week  . Drug use: No  . Sexual activity: Not on file  Other Topics Concern  . Not on file  Social History Narrative  . Not on file   Social Determinants of Health   Financial Resource Strain: Not on file  Food Insecurity: Not on file  Transportation Needs: Not on file  Physical Activity: Not on file  Stress: Not on file  Social Connections: Not on file    Allergies:  Allergies  Allergen Reactions  . Cheese     Causes asthma  . Morphine And Related Rash    Objective:    Vital Signs:   Temp:  [97.7 F (36.5 C)] 97.7 F (36.5 C) (05/06 0248) Pulse Rate:  [68-122] 106 (05/06 0903) Resp:  [16-34] 22 (05/06 0903) BP: (114-159)/(76-98) 119/88 (05/06 0903) SpO2:  [94 %-100 %] 99 % (05/06 1018) FiO2 (%):  [50 %-100 %] 100 % (05/06 1018) Weight:  [83 kg] 83 kg (05/06 0251)    Weight change: Filed Weights   09/23/20 0251  Weight: 83 kg    Intake/Output:   Intake/Output Summary (Last 24 hours) at 09/23/2020 1106 Last data filed at 09/23/2020 0714 Gross per 24 hour  Intake 55.03 ml  Output --  Net 55.03 ml      Physical Exam    General:  Intubated sedated . HEENT: normal Neck: supple. JVP up . Carotids 2+ bilat; no bruits. No lymphadenopathy or thyromegaly appreciated. Cor: PMI nondisplaced. Regular rate & rhythm. No rubs, gallops or murmurs. Lungs: + crackles  Abdomen: soft, nontender, + ventral hernia  No hepatosplenomegaly. No bruits or masses. Good bowel sounds. Extremities: no cyanosis, clubbing, rash, edema Neuro: intubated sedated  EKG    Sinus ST elevation AVR global ST depression    Labs   Basic Metabolic Panel: Recent Labs  Lab 09/23/20 0311  NA 137  K 4.1  CL 100  CO2  21*  GLUCOSE 140*  BUN 36*  CREATININE 1.23  CALCIUM 9.1    Liver Function Tests: Recent Labs  Lab 09/23/20 0311  AST 131*  ALT 94*  ALKPHOS 77  BILITOT 0.9  PROT 7.2  ALBUMIN 3.5   No results for input(s): LIPASE, AMYLASE in the last 168 hours. No results for input(s): AMMONIA in the last 168 hours.  CBC: Recent Labs  Lab 09/23/20 0311 09/23/20 0859  WBC 14.5*  --   NEUTROABS 12.5*  --   HGB 7.5* 9.3*  HCT 24.7* 31.1*  MCV 80.7  --   PLT 269  --  Cardiac Enzymes: Recent Labs  Lab 09/23/20 0554  CKTOTAL 455*  CKMB 65.8*    BNP: BNP (last 3 results) Recent Labs    09/23/20 0311  BNP 1,191.8*    ProBNP (last 3 results) No results for input(s): PROBNP in the last 8760 hours.   CBG: No results for input(s): GLUCAP in the last 168 hours.  Coagulation Studies: Recent Labs    09/23/20 0311  LABPROT 14.7  INR 1.2     Imaging   DG Chest 2 View  Result Date: 09/23/2020 CLINICAL DATA:  Shortness of breath, asthma EXAM: CHEST - 2 VIEW COMPARISON:  Radiograph 01/08/2006 FINDINGS: Extensive multifocal patchy opacities present throughout both lungs including more coalescent density in the right infrahilar lung and left basilar periphery. No pneumothorax. Suspect small bilateral effusions. Prominence of the cardiac silhouette though may be accentuated by portable technique. Pulmonary vascularity is redistributed indistinct. Telemetry leads overlie the chest. Degenerative changes are present in the imaged spine and shoulders. Prior right humeral ORIF without acute complication. IMPRESSION: Multifocal heterogeneous opacities throughout both lungs, could reflect a multifocal infection (including atypical etiologies such as COVID 19) and/or asymmetric edema with additional features of heart failure including a prominent cardiac silhouette, effusions or pulmonary vascular congestion. Electronically Signed   By: Kreg Shropshire M.D.   On: 09/23/2020 04:34   DG Chest  Portable 1 View  Result Date: 09/23/2020 CLINICAL DATA:  Intubation.  Burning chest pain for 2 days EXAM: PORTABLE CHEST 1 VIEW COMPARISON:  Earlier today FINDINGS: Endotracheal tube with tip between the clavicular heads and carina. Progressive airspace disease which is asymmetric to the right. Borderline heart size. No effusion or pneumothorax. IMPRESSION: 1. New endotracheal tube in unremarkable position. 2. Progressive airspace disease with asymmetry to the right suggesting infection. Electronically Signed   By: Marnee Spring M.D.   On: 09/23/2020 09:16   ECHOCARDIOGRAM COMPLETE  Result Date: 09/23/2020    ECHOCARDIOGRAM REPORT   Patient Name:   Jack Vance [facility] Date of Exam: 09/23/2020 Medical Rec #:  [id number]     Height:       66.0 in Accession #:    [id number]    Weight:       183.0 lb Date of Birth:  [birth year]    BSA:          1.926 m Patient Age:    79 years      BP:           153/97 mmHg Patient Gender: M             HR:           117 bpm. Exam Location:  Inpatient Procedure: 2D Echo, Cardiac Doppler and Color Doppler Indications:     Acute Myocardial Infarction I21.9  History:         Patient has no prior history of Echocardiogram examinations.  Sonographer:     Elmarie Shiley Dance Referring Phys:  [id number] Marcelle Smiling Diagnosing Phys: Truett Mainland MD IMPRESSIONS  1. Left ventricular ejection fraction, by estimation, is <20%. The left ventricle has severely decreased function. Severe global hypokinesis with akinesis of anterolateral wall. Grade II diastolic dysfunction (pseudonormalization). Elevated left atrial pressure.  2. Right ventricular systolic function is moderately reduced. The right ventricular size is normal. There is moderately elevated pulmonary artery systolic pressure.  3. Left atrial size was mildly dilated.  4. The mitral valve is grossly normal. Mild to moderate mitral valve regurgitation.  5. Tricuspid valve regurgitation is moderate.  Peak RA-RV gradient 41 mmHg. FINDINGS  Left  Ventricle: Left ventricular ejection fraction, by estimation, is <20%. The left ventricle has severely decreased function. The left ventricle demonstrates regional wall motion abnormalities. The left ventricular internal cavity size was normal in size. There is no left ventricular hypertrophy. Left ventricular diastolic parameters are consistent with Grade II diastolic dysfunction (pseudonormalization). Elevated left atrial pressure. Right Ventricle: The right ventricular size is normal. No increase in right ventricular wall thickness. Right ventricular systolic function is moderately reduced. There is moderately elevated pulmonary artery systolic pressure. The tricuspid regurgitant velocity is 3.22 m/s, and with an assumed right atrial pressure of 8 mmHg, the estimated right ventricular systolic pressure is 49.5 mmHg. Left Atrium: Left atrial size was mildly dilated. Right Atrium: Right atrial size was normal in size. Pericardium: There is no evidence of pericardial effusion. Mitral Valve: The mitral valve is grossly normal. Mild to moderate mitral valve regurgitation. Tricuspid Valve: The tricuspid valve is grossly normal. Tricuspid valve regurgitation is moderate. Aortic Valve: The aortic valve is tricuspid. There is mild aortic valve annular calcification. Aortic valve regurgitation is not visualized. Aortic valve mean gradient measures 5.0 mmHg. Aortic valve peak gradient measures 10.8 mmHg. Aortic valve area, by VTI measures 1.57 cm. Pulmonic Valve: The pulmonic valve was grossly normal. Pulmonic valve regurgitation is not visualized. Aorta: The aortic root and ascending aorta are structurally normal, with no evidence of dilitation. Venous: IVC assessment for right atrial pressure unable to be performed due to mechanical ventilation. IAS/Shunts: No atrial level shunt detected by color flow Doppler.  LEFT VENTRICLE PLAX 2D LVIDd:         4.70 cm LVIDs:         4.00 cm LV PW:         1.00 cm LV IVS:        1.20  cm LVOT diam:     2.00 cm LV SV:         38 LV SV Index:   20 LVOT Area:     3.14 cm  RIGHT VENTRICLE          IVC RV Basal diam:  3.00 cm  IVC diam: 2.30 cm TAPSE (M-mode): 1.6 cm LEFT ATRIUM             Index       RIGHT ATRIUM           Index LA diam:        5.10 cm 2.65 cm/m  RA Area:     15.20 cm LA Vol (A2C):   66.1 ml 34.32 ml/m RA Volume:   42.50 ml  22.07 ml/m LA Vol (A4C):   77.4 ml 40.19 ml/m LA Biplane Vol: 76.3 ml 39.62 ml/m  AORTIC VALVE AV Area (Vmax):    1.70 cm AV Area (Vmean):   1.60 cm AV Area (VTI):     1.57 cm AV Vmax:           164.00 cm/s AV Vmean:          107.000 cm/s AV VTI:            0.242 m AV Peak Grad:      10.8 mmHg AV Mean Grad:      5.0 mmHg LVOT Vmax:         88.65 cm/s LVOT Vmean:        54.350 cm/s LVOT VTI:          0.121 m LVOT/AV VTI ratio: 0.50  AORTA Ao Root diam: 3.40 cm Ao Asc diam:  3.20 cm MITRAL VALVE                TRICUSPID VALVE MV Area (PHT): 5.66 cm     TR Peak grad:   41.5 mmHg MV Decel Time: 134 msec     TR Vmax:        322.00 cm/s MV E velocity: 128.50 cm/s                             SHUNTS                             Systemic VTI:  0.12 m                             Systemic Diam: 2.00 cm Truett Mainland MD Electronically signed by Truett Mainland MD Signature Date/Time: 09/23/2020/10:15:02 AM    Final       Medications:     Current Medications: . [MAR Hold] furosemide  40 mg Intravenous STAT     Infusions: . sodium chloride 10 mL/hr (09/23/20 1028)  . fentaNYL infusion INTRAVENOUS 100 mcg/hr (09/23/20 0940)  . heparin 1,000 Units/hr (09/23/20 0542)  . nitroGLYCERIN 20 mcg/min (09/23/20 0714)  . propofol (DIPRIVAN) infusion         Assessment/Plan   1. 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 Swan numbers ok  2. NSTEMI with critical CAD - 95% LM and high grade ostial RC  3. Acute hypoxic respiratory failure due to pulmonary edema - on vent  4. Iron  deficiency anemia  Cath films reviewed with Dr. Jacinto Halim and Dr. Cliffton Asters. Will place IABP and go for high-risk emergent CABG.   CRITICAL CARE Performed by: Arvilla Meres  Total critical care time: 35 minutes  Critical care time was exclusive of separately billable procedures and treating other patients.  Critical care was necessary to treat or prevent imminent or life-threatening deterioration.  Critical care was time spent personally by me (independent of midlevel providers or residents) on the following activities: development of treatment plan with patient and/or surrogate as well as nursing, discussions with consultants, evaluation of patient's response to treatment, examination of patient, obtaining history from patient or surrogate, ordering and performing treatments and interventions, ordering and review of laboratory studies, ordering and review of radiographic studies, pulse oximetry and re-evaluation of patient's condition.    Length of Stay: 0  Arvilla Meres, MD  09/23/2020, 11:06 AM  Advanced Heart Failure Team Pager [phone] (M-F; 7a - 5p)  Please contact [facility] for night-coverage after hours (4p -7a ) and weekends on [email or web address]

## 2020-09-23 NOTE — ED Notes (Signed)
[facility] called  

## 2020-09-23 NOTE — ED Notes (Addendum)
Pt placed on NRB for comfort while awaiting BIPAP

## 2020-09-23 NOTE — ED Notes (Signed)
RT called for BIPAP. Pt has increased SOB after he attempted to get up to use the urinal. RN notified pt to not get up again and to stay in the bed

## 2020-09-23 NOTE — Progress Notes (Signed)
Assisted with intubating PT in [facility] ED. PT was immediately connected to [facility] ventilator (Setting on Vent Flowsheet).

## 2020-09-23 NOTE — Anesthesia Procedure Notes (Addendum)
Central Venous Catheter Insertion Performed by: Dorris Singh, MD, Aundria Rud, CRNA, anesthesiologist Start/End5/10/2020 12:10 PM, 09/23/2020 12:25 PM Patient location: OR. Emergency situation Position: Trendelenburg Lidocaine 1% used for infiltration and patient sedated Hand hygiene performed , maximum sterile barriers used  and Seldinger technique used Catheter size: 8.5 Fr Central line was placed.Sheath introducer Procedure performed using ultrasound guided technique. Ultrasound Notes:anatomy identified, needle tip was noted to be adjacent to the nerve/plexus identified, no ultrasound evidence of intravascular and/or intraneural injection and image(s) printed for medical record Attempts: 1 Following insertion, line sutured and dressing applied. Post procedure assessment: blood return through all ports, free fluid flow and no air  Patient tolerated the procedure well with no immediate complications.

## 2020-09-23 NOTE — ED Notes (Signed)
Dr. Lynelle Doctor at bedside. Verbal orders received for intubation

## 2020-09-23 NOTE — ED Notes (Signed)
Report given to [facility] 

## 2020-09-23 NOTE — ED Triage Notes (Signed)
Brought in by EMS from home. Difficulty breathing for 2 days. Hx of asthma. No improvement with inhaler.

## 2020-09-23 NOTE — Anesthesia Procedure Notes (Signed)
Arterial Line Insertion Start/End5/10/2020 12:30 PM, 09/23/2020 12:35 PM Performed by: Aundria Rud, CRNA  Patient location: OR. Preanesthetic checklist: patient identified, IV checked, site marked, risks and benefits discussed, surgical consent, monitors and equipment checked, pre-op evaluation, timeout performed and anesthesia consent Lidocaine 1% used for infiltration Left, radial was placed Catheter size: 20 G Hand hygiene performed  and maximum sterile barriers used   Attempts: 1 Procedure performed without using ultrasound guided technique. Following insertion, dressing applied and Biopatch. Post procedure assessment: normal and unchanged  Patient tolerated the procedure well with no immediate complications.

## 2020-09-23 NOTE — ED Notes (Addendum)
Dr. Wilkie Aye notified of critical troponin [phone]

## 2020-09-24 ENCOUNTER — Inpatient Hospital Stay (HOSPITAL_COMMUNITY): Payer: No Typology Code available for payment source

## 2020-09-24 DIAGNOSIS — R0603 Acute respiratory distress: Secondary | ICD-10-CM

## 2020-09-24 LAB — POCT I-STAT 7, (LYTES, BLD GAS, ICA,H+H)
Acid-base deficit: 5 mmol/L — ABNORMAL HIGH (ref 0.0–2.0)
Acid-base deficit: 5 mmol/L — ABNORMAL HIGH (ref 0.0–2.0)
Acid-base deficit: 5 mmol/L — ABNORMAL HIGH (ref 0.0–2.0)
Acid-base deficit: 5 mmol/L — ABNORMAL HIGH (ref 0.0–2.0)
Bicarbonate: 21.5 mmol/L (ref 20.0–28.0)
Bicarbonate: 20.5 mmol/L (ref 20.0–28.0)
Bicarbonate: 20.6 mmol/L (ref 20.0–28.0)
Bicarbonate: 20.6 mmol/L (ref 20.0–28.0)
Calcium, Ion: 1.16 mmol/L (ref 1.15–1.40)
Calcium, Ion: 1.14 mmol/L — ABNORMAL LOW (ref 1.15–1.40)
Calcium, Ion: 1.13 mmol/L — ABNORMAL LOW (ref 1.15–1.40)
Calcium, Ion: 1.17 mmol/L (ref 1.15–1.40)
HCT: 31 % — ABNORMAL LOW (ref 39.0–52.0)
HCT: 27 % — ABNORMAL LOW (ref 39.0–52.0)
HCT: 24 % — ABNORMAL LOW (ref 39.0–52.0)
HCT: 23 % — ABNORMAL LOW (ref 39.0–52.0)
Hemoglobin: 10.5 g/dL — ABNORMAL LOW (ref 13.0–17.0)
Hemoglobin: 9.2 g/dL — ABNORMAL LOW (ref 13.0–17.0)
Hemoglobin: 8.2 g/dL — ABNORMAL LOW (ref 13.0–17.0)
Hemoglobin: 7.8 g/dL — ABNORMAL LOW (ref 13.0–17.0)
O2 Saturation: 95 %
O2 Saturation: 99 %
O2 Saturation: 100 %
O2 Saturation: 98 %
Patient temperature: 36.9
Patient temperature: 36.8
Patient temperature: 36.9
Patient temperature: 36
Potassium: 3.3 mmol/L — ABNORMAL LOW (ref 3.5–5.1)
Potassium: 4.6 mmol/L (ref 3.5–5.1)
Potassium: 4.2 mmol/L (ref 3.5–5.1)
Potassium: 3.3 mmol/L — ABNORMAL LOW (ref 3.5–5.1)
Sodium: 139 mmol/L (ref 135–145)
Sodium: 137 mmol/L (ref 135–145)
Sodium: 137 mmol/L (ref 135–145)
Sodium: 136 mmol/L (ref 135–145)
TCO2: 23 mmol/L (ref 22–32)
TCO2: 22 mmol/L (ref 22–32)
TCO2: 22 mmol/L (ref 22–32)
TCO2: 22 mmol/L (ref 22–32)
pCO2 arterial: 44.9 mmHg (ref 32.0–48.0)
pCO2 arterial: 36.9 mmHg (ref 32.0–48.0)
pCO2 arterial: 37.2 mmHg (ref 32.0–48.0)
pCO2 arterial: 39.8 mmHg (ref 32.0–48.0)
pH, Arterial: 7.288 — ABNORMAL LOW (ref 7.350–7.450)
pH, Arterial: 7.351 (ref 7.350–7.450)
pH, Arterial: 7.351 (ref 7.350–7.450)
pH, Arterial: 7.316 — ABNORMAL LOW (ref 7.350–7.450)
pO2, Arterial: 82 mmHg — ABNORMAL LOW (ref 83.0–108.0)
pO2, Arterial: 164 mmHg — ABNORMAL HIGH (ref 83.0–108.0)
pO2, Arterial: 190 mmHg — ABNORMAL HIGH (ref 83.0–108.0)
pO2, Arterial: 104 mmHg (ref 83.0–108.0)

## 2020-09-24 LAB — LACTIC ACID, PLASMA: Lactic Acid, Venous: 1.2 mmol/L (ref 0.5–1.9)

## 2020-09-24 LAB — COOXEMETRY PANEL
Carboxyhemoglobin: 1.2 % (ref 0.5–1.5)
Methemoglobin: 1.1 % (ref 0.0–1.5)
O2 Saturation: 65.2 %
Total hemoglobin: 8.3 g/dL — ABNORMAL LOW (ref 12.0–16.0)

## 2020-09-24 LAB — BASIC METABOLIC PANEL
Anion gap: 7 (ref 5–15)
Anion gap: 7 (ref 5–15)
BUN: 37 mg/dL — ABNORMAL HIGH (ref 8–23)
BUN: 43 mg/dL — ABNORMAL HIGH (ref 8–23)
CO2: 21 mmol/L — ABNORMAL LOW (ref 22–32)
CO2: 21 mmol/L — ABNORMAL LOW (ref 22–32)
Calcium: 7.7 mg/dL — ABNORMAL LOW (ref 8.9–10.3)
Calcium: 7.9 mg/dL — ABNORMAL LOW (ref 8.9–10.3)
Chloride: 106 mmol/L (ref 98–111)
Chloride: 107 mmol/L (ref 98–111)
Creatinine, Ser: 1.71 mg/dL — ABNORMAL HIGH (ref 0.61–1.24)
Creatinine, Ser: 1.83 mg/dL — ABNORMAL HIGH (ref 0.61–1.24)
GFR, Estimated: 40 mL/min — ABNORMAL LOW (ref >60)
GFR, Estimated: 37 mL/min — ABNORMAL LOW (ref >60)
Glucose, Bld: 116 mg/dL — ABNORMAL HIGH (ref 70–99)
Glucose, Bld: 141 mg/dL — ABNORMAL HIGH (ref 70–99)
Potassium: 4.4 mmol/L (ref 3.5–5.1)
Potassium: 3.8 mmol/L (ref 3.5–5.1)
Sodium: 134 mmol/L — ABNORMAL LOW (ref 135–145)
Sodium: 135 mmol/L (ref 135–145)

## 2020-09-24 LAB — GLUCOSE, CAPILLARY
Glucose-Capillary: 103 mg/dL — ABNORMAL HIGH (ref 70–99)
Glucose-Capillary: 107 mg/dL — ABNORMAL HIGH (ref 70–99)
Glucose-Capillary: 111 mg/dL — ABNORMAL HIGH (ref 70–99)
Glucose-Capillary: 115 mg/dL — ABNORMAL HIGH (ref 70–99)
Glucose-Capillary: 119 mg/dL — ABNORMAL HIGH (ref 70–99)
Glucose-Capillary: 123 mg/dL — ABNORMAL HIGH (ref 70–99)
Glucose-Capillary: 129 mg/dL — ABNORMAL HIGH (ref 70–99)
Glucose-Capillary: 137 mg/dL — ABNORMAL HIGH (ref 70–99)
Glucose-Capillary: 138 mg/dL — ABNORMAL HIGH (ref 70–99)
Glucose-Capillary: 145 mg/dL — ABNORMAL HIGH (ref 70–99)
Glucose-Capillary: 170 mg/dL — ABNORMAL HIGH (ref 70–99)
Glucose-Capillary: 176 mg/dL — ABNORMAL HIGH (ref 70–99)
Glucose-Capillary: 189 mg/dL — ABNORMAL HIGH (ref 70–99)
Glucose-Capillary: 196 mg/dL — ABNORMAL HIGH (ref 70–99)

## 2020-09-24 LAB — CBC
HCT: 27 % — ABNORMAL LOW (ref 39.0–52.0)
HCT: 25.6 % — ABNORMAL LOW (ref 39.0–52.0)
Hemoglobin: 8.5 g/dL — ABNORMAL LOW (ref 13.0–17.0)
Hemoglobin: 8.2 g/dL — ABNORMAL LOW (ref 13.0–17.0)
MCH: 26.4 pg (ref 26.0–34.0)
MCH: 26.9 pg (ref 26.0–34.0)
MCHC: 31.5 g/dL (ref 30.0–36.0)
MCHC: 32 g/dL (ref 30.0–36.0)
MCV: 83.9 fL (ref 80.0–100.0)
MCV: 83.9 fL (ref 80.0–100.0)
Platelets: 133 10*3/uL — ABNORMAL LOW (ref 150–400)
Platelets: 121 10*3/uL — ABNORMAL LOW (ref 150–400)
RBC: 3.22 MIL/uL — ABNORMAL LOW (ref 4.22–5.81)
RBC: 3.05 MIL/uL — ABNORMAL LOW (ref 4.22–5.81)
RDW: 16.6 % — ABNORMAL HIGH (ref 11.5–15.5)
RDW: 16.8 % — ABNORMAL HIGH (ref 11.5–15.5)
WBC: 14.9 10*3/uL — ABNORMAL HIGH (ref 4.0–10.5)
WBC: 16 10*3/uL — ABNORMAL HIGH (ref 4.0–10.5)
nRBC: 0.9 % — ABNORMAL HIGH (ref 0.0–0.2)
nRBC: 0.3 % — ABNORMAL HIGH (ref 0.0–0.2)

## 2020-09-24 LAB — MAGNESIUM
Magnesium: 3.6 mg/dL — ABNORMAL HIGH (ref 1.7–2.4)
Magnesium: 3.3 mg/dL — ABNORMAL HIGH (ref 1.7–2.4)

## 2020-09-24 LAB — PHOSPHORUS: Phosphorus: 5.1 mg/dL — ABNORMAL HIGH (ref 2.5–4.6)

## 2020-09-24 MED ORDER — VITAL HIGH PROTEIN PO LIQD
1000.0000 mL | ORAL | Status: DC
  Administered 2020-09-24: 1000 mL

## 2020-09-24 MED ORDER — QUETIAPINE FUMARATE 25 MG PO TABS
25.0000 mg | ORAL_TABLET | Freq: Two times a day (BID) | ORAL | Status: DC
  Administered 2020-09-24 – 2020-09-25 (×3): 25 mg
  Filled 2020-09-24 (×3): qty 1

## 2020-09-24 MED ORDER — MIDAZOLAM 50MG/50ML (1MG/ML) PREMIX INFUSION
0.0000 mg/h | INTRAVENOUS | Status: DC
  Administered 2020-09-24 – 2020-09-26 (×3): 2 mg/h via INTRAVENOUS
  Administered 2020-09-28: 1.5 mg/h via INTRAVENOUS
  Administered 2020-09-29: 2 mg/h via INTRAVENOUS
  Filled 2020-09-24 (×5): qty 50

## 2020-09-24 MED ORDER — CLONAZEPAM 0.5 MG PO TABS
0.5000 mg | ORAL_TABLET | Freq: Two times a day (BID) | ORAL | Status: DC
  Administered 2020-09-24 – 2020-09-25 (×2): 0.5 mg
  Filled 2020-09-24 (×2): qty 1

## 2020-09-24 MED ORDER — FENTANYL 2500MCG IN NS 250ML (10MCG/ML) PREMIX INFUSION
0.0000 ug/h | INTRAVENOUS | Status: DC
Start: 2020-09-24 — End: 2020-10-11
  Administered 2020-09-24: 100 ug/h via INTRAVENOUS
  Administered 2020-09-25: 150 ug/h via INTRAVENOUS
  Administered 2020-09-25: 200 ug/h via INTRAVENOUS
  Administered 2020-09-26 – 2020-09-27 (×2): 100 ug/h via INTRAVENOUS
  Administered 2020-09-28 – 2020-09-29 (×3): 125 ug/h via INTRAVENOUS
  Filled 2020-09-24 (×8): qty 250

## 2020-09-24 MED ORDER — GABAPENTIN 800 MG PO TABS
400.0000 mg | ORAL_TABLET | Freq: Once | ORAL | Status: DC
  Filled 2020-09-24: qty 0.5

## 2020-09-24 MED ORDER — GABAPENTIN 600 MG PO TABS
300.0000 mg | ORAL_TABLET | Freq: Two times a day (BID) | ORAL | Status: DC
  Administered 2020-09-24 – 2020-09-25 (×2): 300 mg
  Filled 2020-09-24 (×2): qty 1

## 2020-09-24 MED ORDER — GABAPENTIN 250 MG/5ML PO SOLN
400.0000 mg | Freq: Once | ORAL | Status: AC
  Administered 2020-09-24: 400 mg
  Filled 2020-09-24: qty 8

## 2020-09-24 MED ORDER — HYDRALAZINE HCL 20 MG/ML IJ SOLN: 10.0000 mg | Freq: Once | INTRAMUSCULAR | Status: DC

## 2020-09-24 MED ORDER — ADULT MULTIVITAMIN W/MINERALS CH
1.0000 | ORAL_TABLET | Freq: Every day | ORAL | Status: DC
  Administered 2020-09-24 – 2020-09-25 (×2): 1
  Filled 2020-09-24 (×2): qty 1

## 2020-09-24 MED ORDER — OXYCODONE HCL 5 MG PO TABS
5.0000 mg | ORAL_TABLET | Freq: Once | ORAL | Status: AC
  Administered 2020-09-24: 5 mg
  Filled 2020-09-24: qty 1

## 2020-09-24 MED ORDER — HYDRALAZINE HCL 20 MG/ML IJ SOLN
INTRAMUSCULAR | Status: AC
  Administered 2020-09-24: 10 mg via INTRAVENOUS
  Filled 2020-09-24: qty 1

## 2020-09-24 MED ORDER — CLONAZEPAM 0.5 MG PO TABS: 0.5000 mg | ORAL_TABLET | Freq: Two times a day (BID) | ORAL | Status: DC

## 2020-09-24 MED ORDER — ROPINIROLE HCL 1 MG PO TABS
2.0000 mg | ORAL_TABLET | Freq: Every day | ORAL | Status: DC
  Administered 2020-09-24: 2 mg
  Filled 2020-09-24 (×2): qty 2

## 2020-09-24 MED ORDER — IPRATROPIUM-ALBUTEROL 0.5-2.5 (3) MG/3ML IN SOLN
3.0000 mL | Freq: Four times a day (QID) | RESPIRATORY_TRACT | Status: DC
  Administered 2020-09-24 – 2020-10-08 (×55): 3 mL via RESPIRATORY_TRACT
  Filled 2020-09-24 (×56): qty 3

## 2020-09-24 MED ORDER — EPINEPHRINE HCL 5 MG/250ML IV SOLN IN NS
1.0000 ug/min | INTRAVENOUS | Status: DC
  Administered 2020-09-24 – 2020-09-27 (×2): 1 ug/min via INTRAVENOUS
  Filled 2020-09-24 (×2): qty 250

## 2020-09-24 MED ORDER — POTASSIUM CHLORIDE 10 MEQ/50ML IV SOLN
10.0000 meq | INTRAVENOUS | Status: AC
  Administered 2020-09-24 – 2020-09-25 (×3): 10 meq via INTRAVENOUS
  Filled 2020-09-24 (×3): qty 50

## 2020-09-24 MED ORDER — MILRINONE LACTATE IN DEXTROSE 20-5 MG/100ML-% IV SOLN
0.1250 ug/kg/min | INTRAVENOUS | Status: DC
  Administered 2020-09-24: 0.125 ug/kg/min via INTRAVENOUS
  Administered 2020-09-25 – 2020-10-04 (×15): 0.25 ug/kg/min via INTRAVENOUS
  Administered 2020-10-05: 0.125 ug/kg/min via INTRAVENOUS
  Filled 2020-09-24 (×18): qty 100

## 2020-09-24 MED ORDER — CLONAZEPAM 1 MG PO TABS
2.0000 mg | ORAL_TABLET | Freq: Once | ORAL | Status: AC
  Administered 2020-09-24: 2 mg
  Filled 2020-09-24: qty 2

## 2020-09-24 MED ORDER — GABAPENTIN 600 MG PO TABS: 300.0000 mg | ORAL_TABLET | Freq: Two times a day (BID) | ORAL | Status: DC

## 2020-09-24 MED ORDER — PROSOURCE TF PO LIQD
45.0000 mL | Freq: Two times a day (BID) | ORAL | Status: DC
  Administered 2020-09-24 – 2020-09-25 (×3): 45 mL
  Filled 2020-09-24 (×3): qty 45

## 2020-09-24 MED ORDER — THIAMINE HCL 100 MG PO TABS
100.0000 mg | ORAL_TABLET | Freq: Every day | ORAL | Status: DC
  Administered 2020-09-24 – 2020-09-25 (×2): 100 mg
  Filled 2020-09-24 (×2): qty 1

## 2020-09-24 NOTE — Progress Notes (Signed)
[address]       [name] [address]             [phone]        CARDIOTHORACIC SURGERY PROGRESS NOTE   R1 Day Post-Op Procedure(s) (LRB): CORONARY ARTERY BYPASS GRAFTING (CABG)  TIMES 4 USING LEFT GREATER SAPHENOUS VEIN HARVESTED ENDOSCOPICALLY AND LEFT INTERNAL MAMMARY ARTERY (N/A) TRANSESOPHAGEAL ECHOCARDIOGRAM (TEE) (N/A)  Subjective: Sedated on vent.  Dr Gala Romney and Dr Denese Killings at bedside. Reportedly stable overnight but agitated earlier when sedation was weaned  Objective: Vital signs: BP Readings from Last 1 Encounters:  09/24/20 127/85   Pulse Readings from Last 1 Encounters:  09/24/20 71   Resp Readings from Last 1 Encounters:  09/24/20 12   Temp Readings from Last 1 Encounters:  09/24/20 97.7 F (36.5 C)    Hemodynamics: PAP: (26-40)/(19-25) 26/22 CVP:  [4 mmHg-19 mmHg] 18 mmHg  Physical Exam:  Rhythm:   NSR w/ long 1st and intermittent 2nd degree AV block  Breath sounds: Coarse but clear  Heart sounds:  RRR  Incisions:  Dressing dry, intact  Abdomen:  Soft, non-distended  Extremities:  Warm, well-perfused  Chest tubes:  low volume thin serosanguinous output, no air leak    Intake/Output from previous day: 05/06 0701 - 05/07 0700 In: 6847.7 [I.V.:4302.4; Blood:750; NG/GT:120; IV Piggyback:1674.3] Out: 4845 [Urine:1680; Emesis/NG output:330; Blood:2500; Chest Tube:335] Intake/Output this shift: Total I/O In: 151.6 [I.V.:151.6] Out: 110 [Urine:60; Chest Tube:50]  Lab Results:  CBC: Recent Labs    09/23/20 1839 09/23/20 1842 09/23/20 2138 09/24/20 0408  WBC 18.9*  --   --  14.9*  HGB 10.5*   < > 9.2* 8.5*  HCT 32.6*   < > 27.0* 27.0*  PLT 153  --   --  133*   < > = values in this interval not displayed.    BMET:  Recent Labs    09/23/20 0311 09/23/20 1043 09/23/20 1718 09/23/20 1842 09/23/20 2138 09/24/20 0408  NA 137   < > 137   < > 137 134*  K 4.1   < > 3.2*   < > 4.6 4.4  CL 100   < > 100  --    --  106  CO2 21*  --   --   --   --  21*  GLUCOSE 140*   < > 154*  --   --  116*  BUN 36*   < > 30*  --   --  37*  CREATININE 1.23   < > 1.00  --   --  1.71*  CALCIUM 9.1  --   --   --   --  7.7*   < > = values in this interval not displayed.     PT/INR:   Recent Labs    09/23/20 1839  LABPROT 18.1*  INR 1.5*    CBG (last 3)  Recent Labs    09/24/20 0525 09/24/20 0744 09/24/20 0848  GLUCAP 107* 103* 119*    ABG    Component Value Date/Time   PHART 7.351 09/23/2020 2138   PCO2ART 36.9 09/23/2020 2138   PO2ART 164 (H) 09/23/2020 2138   HCO3 20.5 09/23/2020 2138   TCO2 22 09/23/2020 2138   ACIDBASEDEF 5.0 (H) 09/23/2020 2138   O2SAT 99.0 09/23/2020 2138    CXR: PORTABLE CHEST 1 VIEW  COMPARISON:  Chest radiograph from one day prior.  FINDINGS: Endotracheal tube tip is 2.5 cm above the  carina. Enteric tube enters stomach with the tip not seen on this image. Right internal jugular central venous catheter terminates in the middle third of the SVC. Inferior approach Swan-Ganz catheter terminates over the right pulmonary artery. Intact sternotomy wires. Stable mediastinal drain and bibasilar chest tubes. Stable cardiomediastinal silhouette with mild cardiomegaly. No pneumothorax. No significant pleural effusions. Mild patchy hazy and reticular opacities throughout both lungs, similar.  IMPRESSION: 1. No pneumothorax. Well-positioned support structures. 2. Stable mild cardiomegaly with mild patchy hazy and reticular opacities in both lungs, favor mild pulmonary edema superimposed on underlying nonspecific pulmonary fibrosis.   Electronically Signed   By: Delbert Phenix M.D.   On: 09/24/2020 08:32   EKG: NSR w/ 2nd degree AV block, some diffuse ST changes across anterior leads but no significant ST segment elevation   Assessment/Plan: S/P Procedure(s) (LRB): CORONARY ARTERY BYPASS GRAFTING (CABG)  TIMES 4 USING LEFT GREATER SAPHENOUS VEIN HARVESTED  ENDOSCOPICALLY AND LEFT INTERNAL MAMMARY ARTERY (N/A) TRANSESOPHAGEAL ECHOCARDIOGRAM (TEE) (N/A)  Overall stable POD1 Sinus rhythm w/ 1st and 2nd degree AV block Hemodynamics stable with IABP 1:1 on levophed O2 sats 93-100% on 40% FiO2   Agree w/ plans for management of inotropic agents and vent as outlined per Drs Gala Romney and Agarwala  Mobilize once stable off vent  Leave chest tubes in place for now  Purcell Nails, MD 09/24/2020 10:28 AM

## 2020-09-24 NOTE — Progress Notes (Signed)
NAME:  Jack Vance, MRN:  [id number], DOB:  1940-10-05, LOS: 1 ADMISSION DATE:  09/23/2020, CONSULTATION DATE:  09/24/2019 REFERRING MD:  Jacinto Halim, CHIEF COMPLAINT:  Acute coronary syndrome   History of Present Illness:  80 year old man who presented with cardiogenic shock from NSTEMI  Presented with new onset RSCP and severe dyspnea. Found to be in acute pulmonary edema and he was intubated and sent for urgent LHC which revealed 3V CAD.  IABP inserted and patient underwent urgent CABG.   Pertinent  Medical History   Past Medical History:  Diagnosis Date  . Arthritis   . Asthma   . DDD (degenerative disc disease), lumbar   . Hernia of abdominal wall   . RLS (restless legs syndrome)      Significant Hospital Events: Including procedures, antibiotic start and stop dates in addition to other pertinent events   . 5/6 CABG with Dr Cliffton Asters with LIMA to LAD, SVG to RCA d and SVG to ramus. LV normal and returned to ICU with no vasopressor support.  . No post-operative bleeding but poor RV performance on hemodynamics.   Interim History / Subjective:  Wakes agitated and biting/reaching for the tube. Apparently heavy drinking.  Objective   Blood pressure (!) 133/45, pulse 71, temperature (!) 95.54 F (35.3 C), resp. rate 12, height 5\' 6"  (1.676 m), weight 83 kg, SpO2 97 %. PAP: (18-40)/(13-26) 18/13 CVP:  [4 mmHg-33 mmHg] 10 mmHg  Vent Mode: PRVC FiO2 (%):  [40 %-50 %] 40 % Set Rate:  [12 bmp] 12 bmp Vt Set:  [510 mL] 510 mL PEEP:  [5 cmH20] 5 cmH20 Pressure Support:  [10 cmH20] 10 cmH20 Plateau Pressure:  [17 cmH20-19 cmH20] 18 cmH20   Intake/Output Summary (Last 24 hours) at 09/24/2020 1550 Last data filed at 09/24/2020 1540 Gross per 24 hour  Intake 5675.4 ml  Output 4670 ml  Net 1005.4 ml   Filed Weights   09/23/20 0251  Weight: 83 kg    Examination: General: obese, chronically ill man intubated and sedated.  HENT: OGT.ETT in place.  Lungs: crackles at both bases,  tolerated SBT.  Cardiovascular: Extremities cool, + rub + IABP, midline incision intact, minimal chest tube drainage.  Abdomen: protuberant, soft.  Extremities: no edema.  Neuro: moves all purposefully but not to command GU: foley in place with clear urine.   Labs/imaging that I havepersonally reviewed  (right click and "Reselect all SmartList Selections" daily)  CXR shows mild pulmonary edema/atelectasis.   Resolved Hospital Problem list   NSTEMI  Assessment & Plan:  Critically ill due to acute hypoxic and hypercapnic respiratory failure requiring mechanical ventilation Critically ill due to RV dysfunction following ACS and CPB NSTEMI with culprit RCA involvement ( RV involvement?) Agitated delirium Alcohol abuse. CAD  Plan:  - Keep intubated for today until RV recovers - Titrate milrinone and epinephrine to support RV - Initiate multimodal pain control and enteral sedation to control agitation.  - Continue IABP for now   Best practice (right click and "Reselect all SmartList Selections" daily)  Diet:  Tube Feed  Pain/Anxiety/Delirium protocol (if indicated): Yes (RASS goal -1,-2) VAP protocol (if indicated): Yes DVT prophylaxis: Subcutaneous Heparin to start tomorrow.  GI prophylaxis: PPI Glucose control:  SSI Yes Central venous access:  Yes, and it is still needed Arterial line:  Yes, and it is still needed Foley:  Yes, and it is still needed Mobility:  bed rest  PT consulted: N/A Last date of multidisciplinary goals  of care discussion [will update. ] Code Status:  full code Disposition: ICU  CRITICAL CARE Performed by: Lynnell Catalan   Total critical care time: 40 minutes  Critical care time was exclusive of separately billable procedures and treating other patients.  Critical care was necessary to treat or prevent imminent or life-threatening deterioration.  Critical care was time spent personally by me on the following activities: development of treatment  plan with patient and/or surrogate as well as nursing, discussions with consultants, evaluation of patient's response to treatment, examination of patient, obtaining history from patient or surrogate, ordering and performing treatments and interventions, ordering and review of laboratory studies, ordering and review of radiographic studies, pulse oximetry, re-evaluation of patient's condition and participation in multidisciplinary rounds.  Lynnell Catalan, MD [facility] ICU Physician [facility] [place] [facility]  Pager: [phone] Mobile: [phone] After hours: [phone].

## 2020-09-24 NOTE — Progress Notes (Addendum)
eLink Physician-Brief Progress Note Patient Name: Jack Vance DOB: 06-May-1941 MRN: [id number]   Date of Service  09/24/2020  HPI/Events of Note  ABG on 40%/SIMV 12/TV 510/PS 10/P 5 = 7.316/39.8/20.6.   eICU Interventions  Plan: 1. Increase SIMV rate to 14. 2. Repeat ABG at 5 AM.     Intervention Category Major Interventions: Acid-Base disturbance - evaluation and management;Respiratory failure - evaluation and management  Lenell Antu 09/24/2020, 8:35 PM

## 2020-09-24 NOTE — Progress Notes (Signed)
  Swan set-up re-arranged  [employer] numbers done personally with RN and Dr. Gardiner Coins  CVP 13 PA 29/25 (27) PCW 14  Thermo 3.9/2.0  PVR 3.0 SVR 1015 PAPi  0.31  Numbers suggestive of significant RV failure. Increase milrinone to 0.25. Add epi 1.   Repeat echo.   Leave intubated.   Additional CCT 30 mins.   Arvilla Meres, MD  12:33 PM

## 2020-09-24 NOTE — Progress Notes (Signed)
Progress Note  Patient Name: Jack Vance Date of Encounter: 09/24/2020  Attending physician: Lajuana Matte, MD Primary care provider: [facility]   Subjective: Jack Vance is a 80 y.o. male who was seen and examined at bedside at approximately 1139am. Remains intubated.  IABP and Swan in place via RFA and RFV respectively.  Case discussed and reviewed with his nurse.  Objective: Vital Signs in the last 24 hours: Temp:  [96.98 F (36.1 C)-98.42 F (36.9 C)] 96.98 F (36.1 C) (05/07 1100) Pulse Rate:  [0-295] 71 (05/07 0800) Resp:  [0-27] 13 (05/07 1100) BP: (100-156)/(51-104) 150/90 (05/07 1100) SpO2:  [0 %-100 %] 98 % (05/07 1100) Arterial Line BP: (80-173)/(23-61) 173/61 (05/07 1100) FiO2 (%):  [40 %-50 %] 40 % (05/07 1047)  Intake/Output:  Intake/Output Summary (Last 24 hours) at 09/24/2020 1139 Last data filed at 09/24/2020 1135 Gross per 24 hour  Intake 7161 ml  Output 5025 ml  Net 2136 ml    Net IO Since Admission: 2,191.03 mL [09/24/20 1139]  Weights:  Filed Weights   09/23/20 0251  Weight: 83 kg    Telemetry: Personally reviewed - NSR.   Physical examination: PHYSICAL EXAM: Vitals with BMI 09/24/2020 09/24/2020 09/24/2020  Height - - -  Weight - - -  BMI - - -  Systolic 595 638 756  Diastolic 90 54 85  Pulse - - -   CVP 13 PAP 26/17 PCWP 14 CO 3.9 CI 2 SVR 1015  CONSTITUTIONAL: Age appropriate male, critically ill, intubated, hemodynamically stable.   SKIN: Skin is cool and dry. No rash noted. No cyanosis. No pallor. No jaundice HEAD: Normocephalic and atraumatic.  EYES: No scleral icterus MOUTH/THROAT: Moist oral membranes.  NECK: JVD present. No thyromegaly noted. No carotid bruits  LYMPHATIC: No visible cervical adenopathy.  CHEST Chest tubes present, sternotomy wires / dressing present, pacer pad present. No intercostal retractions  LUNGS: mechanical breath sounds, no stridor. No wheezes. No rales.  CARDIOVASCULAR: regular,  S1, S2, IABP pulsation present, no m/ r/ g.  ABDOMINAL: Soft, NT, ND, BS in all four quadrants, ventral hernia present, no apparent ascites.  EXTREMITIES: trace bilateral edema, +2 DP left and +1DP right.  HEMATOLOGIC: No significant bruising NEUROLOGIC: Intubated and sedated.  PSYCHIATRIC: Intubated and sedated.   Lab Results: Hematology Recent Labs  Lab 09/23/20 0311 09/23/20 0859 09/23/20 1043 09/23/20 1553 09/23/20 1617 09/23/20 1839 09/23/20 1842 09/23/20 2138 09/24/20 0408  WBC 14.5*  --   --   --   --  18.9*  --   --  14.9*  RBC 3.06* 3.88*  --   --   --  3.90*  --   --  3.22*  HGB 7.5* 9.3*   < > 7.7*   < > 10.5* 10.5* 9.2* 8.5*  HCT 24.7* 31.1*   < > 23.8*   < > 32.6* 31.0* 27.0* 27.0*  MCV 80.7  --   --   --   --  83.6  --   --  83.9  MCH 24.5*  --   --   --   --  26.9  --   --  26.4  MCHC 30.4  --   --   --   --  32.2  --   --  31.5  RDW 16.5*  --   --   --   --  16.6*  --   --  16.6*  PLT 269  --   --  159  --  153  --   --  133*   < > = values in this interval not displayed.    Chemistry Recent Labs  Lab 09/23/20 0311 09/23/20 1043 09/23/20 1634 09/23/20 1713 09/23/20 1718 09/23/20 1842 09/23/20 2138 09/24/20 0408  NA 137   < > 135   < > 137 139 137 134*  K 4.1   < > 4.2   < > 3.2* 3.3* 4.6 4.4  CL 100   < > 98  --  100  --   --  106  CO2 21*  --   --   --   --   --   --  21*  GLUCOSE 140*   < > 164*  --  154*  --   --  116*  BUN 36*   < > 32*  --  30*  --   --  37*  CREATININE 1.23   < > 1.00  --  1.00  --   --  1.71*  CALCIUM 9.1  --   --   --   --   --   --  7.7*  PROT 7.2  --   --   --   --   --   --   --   ALBUMIN 3.5  --   --   --   --   --   --   --   AST 131*  --   --   --   --   --   --   --   ALT 94*  --   --   --   --   --   --   --   ALKPHOS 77  --   --   --   --   --   --   --   BILITOT 0.9  --   --   --   --   --   --   --   GFRNONAA >60  --   --   --   --   --   --  40*  ANIONGAP 16*  --   --   --   --   --   --  7   < > = values  in this interval not displayed.     Cardiac Enzymes: Cardiac Panel (last 3 results) Recent Labs    09/23/20 0311 09/23/20 0459 09/23/20 0554 09/23/20 0859  CKTOTAL  --   --  455*  --   CKMB  --   --  65.8*  --   TROPONINIHS 7,318* 7,464*  --  5,711*  RELINDX  --   --  14.5*  --     BNP (last 3 results) Recent Labs    09/23/20 0311  BNP 1,191.8*    ProBNP (last 3 results) No results for input(s): PROBNP in the last 8760 hours.   DDimer No results for input(s): DDIMER in the last 168 hours.   Hemoglobin A1c: No results found for: HGBA1C, MPG  TSH No results for input(s): TSH in the last 8760 hours.  Lipid Panel No results found for: CHOL, TRIG, HDL, CHOLHDL, VLDL, LDLCALC, LDLDIRECT  Imaging: DG Chest 2 View  Result Date: 09/23/2020 CLINICAL DATA:  Shortness of breath, asthma EXAM: CHEST - 2 VIEW COMPARISON:  Radiograph 01/08/2006 FINDINGS: Extensive multifocal patchy opacities present throughout both lungs including more coalescent density in the right infrahilar lung and left basilar periphery. No pneumothorax. Suspect small bilateral effusions.  Prominence of the cardiac silhouette though may be accentuated by portable technique. Pulmonary vascularity is redistributed indistinct. Telemetry leads overlie the chest. Degenerative changes are present in the imaged spine and shoulders. Prior right humeral ORIF without acute complication. IMPRESSION: Multifocal heterogeneous opacities throughout both lungs, could reflect a multifocal infection (including atypical etiologies such as COVID 19) and/or asymmetric edema with additional features of heart failure including a prominent cardiac silhouette, effusions or pulmonary vascular congestion. Electronically Signed   By: Lovena Le M.D.   On: 09/23/2020 04:34   CARDIAC CATHETERIZATION  Result Date: 09/23/2020  Ost RCA lesion is 80% stenosed.  Ost LM to Mid LM lesion is 90% stenosed.  1st Diag lesion is 60% stenosed.  Ost Cx to  Prox Cx lesion is 90% stenosed.  LV end diastolic pressure is severely elevated.  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  Right and left heart catheterization 09/23/2020: RA 10/9, mean 9 mmHg. RV 50/7, EDP 11 mmHg PA 48/26, mean 36 mmHg.  PA saturation 69%. PW 26/34, mean 26 mmHg.  Aortic saturation 100%. Cardiac output 8.1, cardiac index 4.21 by Fick.  Patient on the ventilator. LV: 108/15, EDP 27 mmHg.  There was no pressure gradient across the aortic valve. LM: Irregular, ulcerated, 90% ostial stenosis and proximal stenosis.  Extends into the circumflex coronary artery. LAD: Very minimal disease.  D1 is large, proximal 50 to 60% stenosis. RI: Ostium has 50 to 60% stenosis. CX: Ulcerated ostial 90% stenosis. RCA: Ostium 80% stenosis.  Severe damping with engagement of the artery with no backflow. Impression: Patient in cardiogenic shock however cardiac output and index are maintained on pressor support, has surgical disease and has been evaluated by Dr. Kipp Brood and will be taken emergently for CABG.  Intra-aortic balloon pump was placed for hemodynamic support and stability.  Further recommendations to follow.  I have discussed with his sister Diane on the phone.  20 mL contrast utilized to limit contrast nephropathy in a patient with cardiogenic shock. I appreciate Dr. Pierre Bali assisting me with management of patient was seen cardiogenic shock and critically ill.  DG Chest Port 1 View  Result Date: 09/24/2020 CLINICAL DATA:  Status post three-vessel CABG EXAM: PORTABLE CHEST 1 VIEW COMPARISON:  Chest radiograph from one day prior. FINDINGS: Endotracheal tube tip is 2.5 cm above the carina. Enteric tube enters stomach with the tip not seen on this image. Right internal jugular central venous catheter terminates in the middle third of the SVC. Inferior approach Swan-Ganz catheter terminates over the right pulmonary artery. Intact sternotomy wires. Stable mediastinal drain  and bibasilar chest tubes. Stable cardiomediastinal silhouette with mild cardiomegaly. No pneumothorax. No significant pleural effusions. Mild patchy hazy and reticular opacities throughout both lungs, similar. IMPRESSION: 1. No pneumothorax. Well-positioned support structures. 2. Stable mild cardiomegaly with mild patchy hazy and reticular opacities in both lungs, favor mild pulmonary edema superimposed on underlying nonspecific pulmonary fibrosis. Electronically Signed   By: Ilona Sorrel M.D.   On: 09/24/2020 08:32   DG Chest Port 1 View  Result Date: 09/23/2020 CLINICAL DATA:  Status post coronary bypass grafting EXAM: PORTABLE CHEST 1 VIEW COMPARISON:  Film from earlier in the same day. FINDINGS: Postsurgical changes are now seen. Endotracheal tube, gastric catheter, Swan-Ganz catheter and pericardial drain are seen mediastinal drain is noted as well. Intra-aortic balloon pump is noted in satisfactory position at the level of the aortic arch. Patchy airspace opacity is noted but improved from the previous exam. No pneumothorax is  seen. Right jugular catheter is noted as well. IMPRESSION: Patchy but improved airspace opacities bilaterally. Tubes and lines as described above. Electronically Signed   By: Inez Catalina M.D.   On: 09/23/2020 19:14   DG Chest Portable 1 View  Result Date: 09/23/2020 CLINICAL DATA:  Intubation.  Burning chest pain for 2 days EXAM: PORTABLE CHEST 1 VIEW COMPARISON:  Earlier today FINDINGS: Endotracheal tube with tip between the clavicular heads and carina. Progressive airspace disease which is asymmetric to the right. Borderline heart size. No effusion or pneumothorax. IMPRESSION: 1. New endotracheal tube in unremarkable position. 2. Progressive airspace disease with asymmetry to the right suggesting infection. Electronically Signed   By: Monte Fantasia M.D.   On: 09/23/2020 09:16   ECHOCARDIOGRAM COMPLETE  Result Date: 09/23/2020    ECHOCARDIOGRAM REPORT   Patient Name:    Jack Vance [facility] Date of Exam: 09/23/2020 Medical Rec #:  [id number]     Height:       66.0 in Accession #:    [id number]    Weight:       183.0 lb Date of Birth:  July 27, 1940    BSA:          1.926 m Patient Age:    51 years      BP:           153/97 mmHg Patient Gender: M             HR:           117 bpm. Exam Location:  Inpatient Procedure: 2D Echo, Cardiac Doppler and Color Doppler Indications:     Acute Myocardial Infarction I21.9  History:         Patient has no prior history of Echocardiogram examinations.  Sonographer:     Jonelle Sidle Dance Referring Phys:  [id number] Renee Pain Diagnosing Phys: Vernell Leep MD IMPRESSIONS  1. Left ventricular ejection fraction, by estimation, is <20%. The left ventricle has severely decreased function. Severe global hypokinesis with akinesis of anterolateral wall. Grade II diastolic dysfunction (pseudonormalization). Elevated left atrial pressure.  2. Right ventricular systolic function is moderately reduced. The right ventricular size is normal. There is moderately elevated pulmonary artery systolic pressure.  3. Left atrial size was mildly dilated.  4. The mitral valve is grossly normal. Mild to moderate mitral valve regurgitation.  5. Tricuspid valve regurgitation is moderate. Peak RA-RV gradient 41 mmHg. FINDINGS  Left Ventricle: Left ventricular ejection fraction, by estimation, is <20%. The left ventricle has severely decreased function. The left ventricle demonstrates regional wall motion abnormalities. The left ventricular internal cavity size was normal in size. There is no left ventricular hypertrophy. Left ventricular diastolic parameters are consistent with Grade II diastolic dysfunction (pseudonormalization). Elevated left atrial pressure. Right Ventricle: The right ventricular size is normal. No increase in right ventricular wall thickness. Right ventricular systolic function is moderately reduced. There is moderately elevated pulmonary artery systolic pressure.  The tricuspid regurgitant velocity is 3.22 m/s, and with an assumed right atrial pressure of 8 mmHg, the estimated right ventricular systolic pressure is 27.2 mmHg. Left Atrium: Left atrial size was mildly dilated. Right Atrium: Right atrial size was normal in size. Pericardium: There is no evidence of pericardial effusion. Mitral Valve: The mitral valve is grossly normal. Mild to moderate mitral valve regurgitation. Tricuspid Valve: The tricuspid valve is grossly normal. Tricuspid valve regurgitation is moderate. Aortic Valve: The aortic valve is tricuspid. There is mild aortic valve annular calcification. Aortic valve regurgitation is not visualized.  Aortic valve mean gradient measures 5.0 mmHg. Aortic valve peak gradient measures 10.8 mmHg. Aortic valve area, by VTI measures 1.57 cm. Pulmonic Valve: The pulmonic valve was grossly normal. Pulmonic valve regurgitation is not visualized. Aorta: The aortic root and ascending aorta are structurally normal, with no evidence of dilitation. Venous: IVC assessment for right atrial pressure unable to be performed due to mechanical ventilation. IAS/Shunts: No atrial level shunt detected by color flow Doppler.  LEFT VENTRICLE PLAX 2D LVIDd:         4.70 cm LVIDs:         4.00 cm LV PW:         1.00 cm LV IVS:        1.20 cm LVOT diam:     2.00 cm LV SV:         38 LV SV Index:   20 LVOT Area:     3.14 cm  RIGHT VENTRICLE          IVC RV Basal diam:  3.00 cm  IVC diam: 2.30 cm TAPSE (M-mode): 1.6 cm LEFT ATRIUM             Index       RIGHT ATRIUM           Index LA diam:        5.10 cm 2.65 cm/m  RA Area:     15.20 cm LA Vol (A2C):   66.1 ml 34.32 ml/m RA Volume:   42.50 ml  22.07 ml/m LA Vol (A4C):   77.4 ml 40.19 ml/m LA Biplane Vol: 76.3 ml 39.62 ml/m  AORTIC VALVE AV Area (Vmax):    1.70 cm AV Area (Vmean):   1.60 cm AV Area (VTI):     1.57 cm AV Vmax:           164.00 cm/s AV Vmean:          107.000 cm/s AV VTI:            0.242 m AV Peak Grad:      10.8 mmHg  AV Mean Grad:      5.0 mmHg LVOT Vmax:         88.65 cm/s LVOT Vmean:        54.350 cm/s LVOT VTI:          0.121 m LVOT/AV VTI ratio: 0.50  AORTA Ao Root diam: 3.40 cm Ao Asc diam:  3.20 cm MITRAL VALVE                TRICUSPID VALVE MV Area (PHT): 5.66 cm     TR Peak grad:   41.5 mmHg MV Decel Time: 134 msec     TR Vmax:        322.00 cm/s MV E velocity: 128.50 cm/s                             SHUNTS                             Systemic VTI:  0.12 m                             Systemic Diam: 2.00 cm Vernell Leep MD Electronically signed by Vernell Leep MD Signature Date/Time: 09/23/2020/10:15:02 AM    Final     Cardiac database: EKG: 09/23/20 S.  Tachy @ 101/min. ST elevation in aVR 2 mm and profound global ST depression 2 mm.  09/24/2020: Normal sinus rhythm, 66 bpm, second-degree type I AV block, diffuse ST-T changes.  Compared to prior EKG dated 09/24/2018 diffuse ST T changes remain stable but aVR elevation is now absent.  Echocardiogram: 09/23/2020:  1. Left ventricular ejection fraction, by estimation, is <20%. The left ventricle has severely decreased function. Severe global hypokinesis with akinesis of anterolateral wall. Grade II diastolic dysfunction (pseudonormalization). Elevated left atrial  pressure. 2. Right ventricular systolic function is moderately reduced. The right ventricular size is normal. There is moderately elevated pulmonary artery systolic pressure. 3. Left atrial size was mildly dilated. 4. The mitral valve is grossly normal. Mild to moderate mitral valve regurgitation. 5. Tricuspid valve regurgitation is moderate. Peak RA-RV gradient 41 mmHg.  Stress test: NA  Heart catheterization: 09/23/2020:  Ost RCA lesion is 80% stenosed.  Ost LM to Mid LM lesion is 90% stenosed.  1st Diag lesion is 60% stenosed.  Ost Cx to Prox Cx lesion is 90% stenosed.  LV end diastolic pressure is severely elevated.  Hemodynamic findings consistent with moderate  pulmonary hypertension.   Right and left heart catheterization 09/23/2020: RA 10/9, mean 9 mmHg. RV 50/7, EDP 11 mmHg PA 48/26, mean 36 mmHg.  PA saturation 69%. PW 26/34, mean 26 mmHg.  Aortic saturation 100%. Cardiac output 8.1, cardiac index 4.21 by Fick.  Patient on the ventilator.  LV: 108/15, EDP 27 mmHg.  There was no pressure gradient across the aortic valve. LM: Irregular, ulcerated, 90% ostial stenosis and proximal stenosis.  Extends into the circumflex coronary artery. LAD: Very minimal disease.  D1 is large, proximal 50 to 60% stenosis. RI: Ostium has 50 to 60% stenosis. CX: Ulcerated ostial 90% stenosis. RCA: Ostium 80% stenosis.  Severe damping with engagement of the artery with no backflow.   Scheduled Meds: . acetaminophen  1,000 mg Oral Q6H   Or  . acetaminophen (TYLENOL) oral liquid 160 mg/5 mL  1,000 mg Per Tube Q6H  . acetaminophen (TYLENOL) oral liquid 160 mg/5 mL  650 mg Per Tube Once   Or  . acetaminophen  650 mg Rectal Once  . aspirin EC  325 mg Oral Daily   Or  . aspirin  324 mg Per Tube Daily  . atorvastatin  80 mg Oral Daily  . bisacodyl  10 mg Oral Daily   Or  . bisacodyl  10 mg Rectal Daily  . chlorhexidine gluconate (MEDLINE KIT)  15 mL Mouth Rinse BID  . Chlorhexidine Gluconate Cloth  6 each Topical Daily  . docusate sodium  200 mg Oral Daily  . mouth rinse  15 mL Mouth Rinse 10 times per day  . oxyCODONE  5 mg Per Tube Once  . [START ON 09/25/2020] pantoprazole  40 mg Oral Daily  . sodium chloride flush  10-40 mL Intracatheter Q12H  . sodium chloride flush  3 mL Intravenous Q12H    Continuous Infusions: . sodium chloride 20 mL/hr at 09/24/20 1100  . sodium chloride    . sodium chloride    . albumin human Stopped (09/24/20 0430)  .  ceFAZolin (ANCEF) IV Stopped (09/24/20 0547)  . dexmedetomidine (PRECEDEX) IV infusion 0.7 mcg/kg/hr (09/24/20 1100)  . famotidine (PEPCID) IV Stopped (09/23/20 2205)  . insulin 0.9 Units/hr (09/24/20 0700)  .  lactated ringers    . lactated ringers 20 mL/hr at 09/24/20 1100  . lactated ringers 20 mL/hr at 09/24/20 1100  .  milrinone 0.125 mcg/kg/min (09/24/20 1112)  . nitroGLYCERIN    . norepinephrine (LEVOPHED) Adult infusion Stopped (09/24/20 1055)    PRN Meds: sodium chloride, albumin human, dextrose, fentaNYL (SUBLIMAZE) injection, lactated ringers, metoprolol tartrate, midazolam, ondansetron (ZOFRAN) IV, sodium chloride flush, sodium chloride flush, traMADol   IMPRESSION & RECOMMENDATIONS: Jack Vance is a 80 y.o. male presents to [facility] hospital as a non-STEMI found to be in cardiogenic shock transferred to [facility] for further evaluation.  Underwent left heart catheterization by Dr. 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  Patient underwent emergent coronary artery bypass grafting surgery with Dr. Kipp Brood and now is currently cardiac ICU for postop management.  Postop day 1 status post emergent three-vessel CABG (CABG X 3, LIMA LAD, reverse SVG to to distal right, reverse SVG to ramus) for critical left main disease. -s/p CABG 09/23/2020 -Continue ASA and Statin. -CT in place -pacer pad present  -chest xray reviewed.  -EKG in am noted NSR w/ 2nd degree AVB Type I, BB d/c.    Cardiogenic shock -Case d/w Dr. Haroldine Laws  -Started on milrinone this morning and CO/CI have improved.  -Invasive hemodynamics measured at bedside.  -Concerns for RV failure. Plan is to delay extubation for additional 24hrs recommendations to be reviewed w/ CCM. Low dose Epi and diuresis and close monitoring.  -IABP in place via RFA.  Luiz Blare in place via RFV.  -Check Echo to re-evaluate the RV function.   Acute biventricular heart failure: See above.   NSTEMI: Continue current management. Will uptitrate to GDMT once more stable.   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 Will uptitrate to GDMT once more stable.  -Check  fasting lipid in am   Acute kidney injury: Most-likely secondary to type I cardiorenal syndrome, cardiogenic shock, ischemic ATN. Monitor BUN / Cr   Acute hypoxic respiratory failure due to pulmonary edema:  Vent management per CCM.    CRITICAL CARE Performed by: Rex Kras   Total critical care time: 40 minutes   Critical care time was exclusive of separately billable procedures and treating other patients.   Critical care was necessary to treat or prevent imminent or life-threatening deterioration.   Critical care was time spent personally by me on the following activities: development of treatment plan with patient and/or surrogate as well as nursing, discussions with consultants, evaluation of patient's response to treatment, examination of patient, obtaining history from patient or surrogate, ordering and performing treatments and interventions, ordering and review of laboratory studies, ordering and review of radiographic studies, pulse oximetry and re-evaluation of patient's condition.  This note was created using a voice recognition software as a result there may be grammatical errors inadvertently enclosed that do not reflect the nature of this encounter. Every attempt is made to correct such errors.  Rex Kras, DO, [facility] Pager: [phone] Office: [phone]  09/24/2020, 11:39 AM

## 2020-09-24 NOTE — Progress Notes (Signed)
[facility] BRIEF SICU PROGRESS NOTE  1 Day Post-Op  S/P Procedure(s) (LRB): CORONARY ARTERY BYPASS GRAFTING (CABG)  TIMES 4 USING LEFT GREATER SAPHENOUS VEIN HARVESTED ENDOSCOPICALLY AND LEFT INTERNAL MAMMARY ARTERY (N/A) TRANSESOPHAGEAL ECHOCARDIOGRAM (TEE) (N/A)   Sedated on vent Hemodynamically stable, now on Epi and milrinone to assist w/ RV dysfunction O2 sats 97% on 40% FiO2 UOP adequate Labs okay, creatinine up to 1.83  Plan: Continue current plan  Purcell Nails, MD 09/24/2020 5:37 PM

## 2020-09-24 NOTE — Progress Notes (Signed)
Advanced Heart Failure Rounding Note   Subjective:    Underwent emergent CABG on 5/6 for criitcal LM disease (LIMA -> LAD, SVG -> OM, SVG -> RCA)  Awake on vent. Agitated. Not following commands.   BPs have been labile. Was on nicardipine with MAPs in 50s when I walked in to room. NE started.   Also on IABP 1:1   CI 1.6-1.7 on swan. (Output high on Flo-trak)  PA tracing fairly flat   Objective:   Weight Range:  Vital Signs:   Temp:  [97.7 F (36.5 C)-98.42 F (36.9 C)] 97.7 F (36.5 C) (05/07 0900) Pulse Rate:  [0-295] 71 (05/07 0800) Resp:  [0-27] 12 (05/07 0900) BP: (105-167)/(51-104) 127/85 (05/07 0900) SpO2:  [0 %-100 %] 98 % (05/07 0900) Arterial Line BP: (80-155)/(23-48) 135/27 (05/07 0900) FiO2 (%):  [40 %-100 %] 40 % (05/07 0757)    Weight change: Filed Weights   09/23/20 0251  Weight: 83 kg    Intake/Output:   Intake/Output Summary (Last 24 hours) at 09/24/2020 1009 Last data filed at 09/24/2020 0900 Gross per 24 hour  Intake 6995.38 ml  Output 4955 ml  Net 2040.38 ml     Physical Exam: General:  Awake on vent  Not following commands HEENT: normal Neck: supple. JVP up . Carotids 2+ bilat; no bruits. No lymphadenopathy or thryomegaly appreciated. Cor: Surgical dressing . Regular rate & rhythm +CTs.  Lungs: coarse Abdomen: soft, nontender. + ventral hernia  No hepatosplenomegaly. No bruits or masses. Hypoactive bowel sounds. Extremities: no cyanosis, clubbing, rash, 1+ edema RFA IABP and RFV swan Neuro: awake on vent agitated.   Telemetry: Sinus 60-70s Personally reviewed   Labs: Basic Metabolic Panel: Recent Labs  Lab 09/23/20 0311 09/23/20 1043 09/23/20 1439 09/23/20 1508 09/23/20 1552 09/23/20 1617 09/23/20 1634 09/23/20 1713 09/23/20 1718 09/23/20 1842 09/23/20 2138 09/24/20 0408  NA 137   < > 133*   < > 133*   < > 135 137 137 139 137 134*  K 4.1   < > 4.1   < > 3.9   < > 4.2 3.2* 3.2* 3.3* 4.6 4.4  CL 100   < > 95*  --  97*   --  98  --  100  --   --  106  CO2 21*  --   --   --   --   --   --   --   --   --   --  21*  GLUCOSE 140*   < > 173*  --  154*  --  164*  --  154*  --   --  116*  BUN 36*   < > 29*  --  28*  --  32*  --  30*  --   --  37*  CREATININE 1.23   < > 1.00  --  1.00  --  1.00  --  1.00  --   --  1.71*  CALCIUM 9.1  --   --   --   --   --   --   --   --   --   --  7.7*  MG  --   --   --   --   --   --   --   --   --   --   --  3.6*   < > = values in this interval not displayed.    Liver Function Tests: Recent Labs  Lab 09/23/20 0311  AST 131*  ALT 94*  ALKPHOS 77  BILITOT 0.9  PROT 7.2  ALBUMIN 3.5   No results for input(s): LIPASE, AMYLASE in the last 168 hours. No results for input(s): AMMONIA in the last 168 hours.  CBC: Recent Labs  Lab 09/23/20 0311 09/23/20 0859 09/23/20 1553 09/23/20 1617 09/23/20 1718 09/23/20 1839 09/23/20 1842 09/23/20 2138 09/24/20 0408  WBC 14.5*  --   --   --   --  18.9*  --   --  14.9*  NEUTROABS 12.5*  --   --   --   --   --   --   --   --   HGB 7.5*   < > 7.7*   < > 9.9* 10.5* 10.5* 9.2* 8.5*  HCT 24.7*   < > 23.8*   < > 29.0* 32.6* 31.0* 27.0* 27.0*  MCV 80.7  --   --   --   --  83.6  --   --  83.9  PLT 269  --  159  --   --  153  --   --  133*   < > = values in this interval not displayed.    Cardiac Enzymes: Recent Labs  Lab 09/23/20 0554  CKTOTAL 455*  CKMB 65.8*    BNP: BNP (last 3 results) Recent Labs    09/23/20 0311  BNP 1,191.8*    ProBNP (last 3 results) No results for input(s): PROBNP in the last 8760 hours.    Other results:  Imaging: DG Chest 2 View  Result Date: 09/23/2020 CLINICAL DATA:  Shortness of breath, asthma EXAM: CHEST - 2 VIEW COMPARISON:  Radiograph 01/08/2006 FINDINGS: Extensive multifocal patchy opacities present throughout both lungs including more coalescent density in the right infrahilar lung and left basilar periphery. No pneumothorax. Suspect small bilateral effusions. Prominence of the  cardiac silhouette though may be accentuated by portable technique. Pulmonary vascularity is redistributed indistinct. Telemetry leads overlie the chest. Degenerative changes are present in the imaged spine and shoulders. Prior right humeral ORIF without acute complication. IMPRESSION: Multifocal heterogeneous opacities throughout both lungs, could reflect a multifocal infection (including atypical etiologies such as COVID 19) and/or asymmetric edema with additional features of heart failure including a prominent cardiac silhouette, effusions or pulmonary vascular congestion. Electronically Signed   By: Lovena Le M.D.   On: 09/23/2020 04:34   CARDIAC CATHETERIZATION  Result Date: 09/23/2020  Ost RCA lesion is 80% stenosed.  Ost LM to Mid LM lesion is 90% stenosed.  1st Diag lesion is 60% stenosed.  Ost Cx to Prox Cx lesion is 90% stenosed.  LV end diastolic pressure is severely elevated.  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  Right and left heart catheterization 09/23/2020: RA 10/9, mean 9 mmHg. RV 50/7, EDP 11 mmHg PA 48/26, mean 36 mmHg.  PA saturation 69%. PW 26/34, mean 26 mmHg.  Aortic saturation 100%. Cardiac output 8.1, cardiac index 4.21 by Fick.  Patient on the ventilator. LV: 108/15, EDP 27 mmHg.  There was no pressure gradient across the aortic valve. LM: Irregular, ulcerated, 90% ostial stenosis and proximal stenosis.  Extends into the circumflex coronary artery. LAD: Very minimal disease.  D1 is large, proximal 50 to 60% stenosis. RI: Ostium has 50 to 60% stenosis. CX: Ulcerated ostial 90% stenosis. RCA: Ostium 80% stenosis.  Severe damping with engagement of the artery with no backflow. Impression: Patient in cardiogenic shock however cardiac output and index are maintained on  pressor support, has surgical disease and has been evaluated by Dr. Kipp Brood and will be taken emergently for CABG.  Intra-aortic balloon pump was placed for hemodynamic support and  stability.  Further recommendations to follow.  I have discussed with his sister Diane on the phone.  20 mL contrast utilized to limit contrast nephropathy in a patient with cardiogenic shock. I appreciate Dr. Pierre Bali assisting me with management of patient was seen cardiogenic shock and critically ill.  DG Chest Port 1 View  Result Date: 09/24/2020 CLINICAL DATA:  Status post three-vessel CABG EXAM: PORTABLE CHEST 1 VIEW COMPARISON:  Chest radiograph from one day prior. FINDINGS: Endotracheal tube tip is 2.5 cm above the carina. Enteric tube enters stomach with the tip not seen on this image. Right internal jugular central venous catheter terminates in the middle third of the SVC. Inferior approach Swan-Ganz catheter terminates over the right pulmonary artery. Intact sternotomy wires. Stable mediastinal drain and bibasilar chest tubes. Stable cardiomediastinal silhouette with mild cardiomegaly. No pneumothorax. No significant pleural effusions. Mild patchy hazy and reticular opacities throughout both lungs, similar. IMPRESSION: 1. No pneumothorax. Well-positioned support structures. 2. Stable mild cardiomegaly with mild patchy hazy and reticular opacities in both lungs, favor mild pulmonary edema superimposed on underlying nonspecific pulmonary fibrosis. Electronically Signed   By: Ilona Sorrel M.D.   On: 09/24/2020 08:32   DG Chest Port 1 View  Result Date: 09/23/2020 CLINICAL DATA:  Status post coronary bypass grafting EXAM: PORTABLE CHEST 1 VIEW COMPARISON:  Film from earlier in the same day. FINDINGS: Postsurgical changes are now seen. Endotracheal tube, gastric catheter, Swan-Ganz catheter and pericardial drain are seen mediastinal drain is noted as well. Intra-aortic balloon pump is noted in satisfactory position at the level of the aortic arch. Patchy airspace opacity is noted but improved from the previous exam. No pneumothorax is seen. Right jugular catheter is noted as well. IMPRESSION: Patchy  but improved airspace opacities bilaterally. Tubes and lines as described above. Electronically Signed   By: Inez Catalina M.D.   On: 09/23/2020 19:14   DG Chest Portable 1 View  Result Date: 09/23/2020 CLINICAL DATA:  Intubation.  Burning chest pain for 2 days EXAM: PORTABLE CHEST 1 VIEW COMPARISON:  Earlier today FINDINGS: Endotracheal tube with tip between the clavicular heads and carina. Progressive airspace disease which is asymmetric to the right. Borderline heart size. No effusion or pneumothorax. IMPRESSION: 1. New endotracheal tube in unremarkable position. 2. Progressive airspace disease with asymmetry to the right suggesting infection. Electronically Signed   By: Monte Fantasia M.D.   On: 09/23/2020 09:16   ECHOCARDIOGRAM COMPLETE  Result Date: 09/23/2020    ECHOCARDIOGRAM REPORT   Patient Name:   Jack Vance [facility] Date of Exam: 09/23/2020 Medical Rec #:  [id number]     Height:       66.0 in Accession #:    [id number]    Weight:       183.0 lb Date of Birth:  May 19, 1941    BSA:          1.926 m Patient Age:    80 years      BP:           153/97 mmHg Patient Gender: M             HR:           117 bpm. Exam Location:  Inpatient Procedure: 2D Echo, Cardiac Doppler and Color Doppler Indications:     Acute Myocardial Infarction  I21.9  History:         Patient has no prior history of Echocardiogram examinations.  Sonographer:     Jonelle Sidle Dance Referring Phys:  [id number] Renee Pain Diagnosing Phys: Vernell Leep MD IMPRESSIONS  1. Left ventricular ejection fraction, by estimation, is <20%. The left ventricle has severely decreased function. Severe global hypokinesis with akinesis of anterolateral wall. Grade II diastolic dysfunction (pseudonormalization). Elevated left atrial pressure.  2. Right ventricular systolic function is moderately reduced. The right ventricular size is normal. There is moderately elevated pulmonary artery systolic pressure.  3. Left atrial size was mildly dilated.  4. The mitral  valve is grossly normal. Mild to moderate mitral valve regurgitation.  5. Tricuspid valve regurgitation is moderate. Peak RA-RV gradient 41 mmHg. FINDINGS  Left Ventricle: Left ventricular ejection fraction, by estimation, is <20%. The left ventricle has severely decreased function. The left ventricle demonstrates regional wall motion abnormalities. The left ventricular internal cavity size was normal in size. There is no left ventricular hypertrophy. Left ventricular diastolic parameters are consistent with Grade II diastolic dysfunction (pseudonormalization). Elevated left atrial pressure. Right Ventricle: The right ventricular size is normal. No increase in right ventricular wall thickness. Right ventricular systolic function is moderately reduced. There is moderately elevated pulmonary artery systolic pressure. The tricuspid regurgitant velocity is 3.22 m/s, and with an assumed right atrial pressure of 8 mmHg, the estimated right ventricular systolic pressure is 91.9 mmHg. Left Atrium: Left atrial size was mildly dilated. Right Atrium: Right atrial size was normal in size. Pericardium: There is no evidence of pericardial effusion. Mitral Valve: The mitral valve is grossly normal. Mild to moderate mitral valve regurgitation. Tricuspid Valve: The tricuspid valve is grossly normal. Tricuspid valve regurgitation is moderate. Aortic Valve: The aortic valve is tricuspid. There is mild aortic valve annular calcification. Aortic valve regurgitation is not visualized. Aortic valve mean gradient measures 5.0 mmHg. Aortic valve peak gradient measures 10.8 mmHg. Aortic valve area, by VTI measures 1.57 cm. Pulmonic Valve: The pulmonic valve was grossly normal. Pulmonic valve regurgitation is not visualized. Aorta: The aortic root and ascending aorta are structurally normal, with no evidence of dilitation. Venous: IVC assessment for right atrial pressure unable to be performed due to mechanical ventilation. IAS/Shunts: No  atrial level shunt detected by color flow Doppler.  LEFT VENTRICLE PLAX 2D LVIDd:         4.70 cm LVIDs:         4.00 cm LV PW:         1.00 cm LV IVS:        1.20 cm LVOT diam:     2.00 cm LV SV:         38 LV SV Index:   20 LVOT Area:     3.14 cm  RIGHT VENTRICLE          IVC RV Basal diam:  3.00 cm  IVC diam: 2.30 cm TAPSE (M-mode): 1.6 cm LEFT ATRIUM             Index       RIGHT ATRIUM           Index LA diam:        5.10 cm 2.65 cm/m  RA Area:     15.20 cm LA Vol (A2C):   66.1 ml 34.32 ml/m RA Volume:   42.50 ml  22.07 ml/m LA Vol (A4C):   77.4 ml 40.19 ml/m LA Biplane Vol: 76.3 ml 39.62 ml/m  AORTIC VALVE AV  Area (Vmax):    1.70 cm AV Area (Vmean):   1.60 cm AV Area (VTI):     1.57 cm AV Vmax:           164.00 cm/s AV Vmean:          107.000 cm/s AV VTI:            0.242 m AV Peak Grad:      10.8 mmHg AV Mean Grad:      5.0 mmHg LVOT Vmax:         88.65 cm/s LVOT Vmean:        54.350 cm/s LVOT VTI:          0.121 m LVOT/AV VTI ratio: 0.50  AORTA Ao Root diam: 3.40 cm Ao Asc diam:  3.20 cm MITRAL VALVE                TRICUSPID VALVE MV Area (PHT): 5.66 cm     TR Peak grad:   41.5 mmHg MV Decel Time: 134 msec     TR Vmax:        322.00 cm/s MV E velocity: 128.50 cm/s                             SHUNTS                             Systemic VTI:  0.12 m                             Systemic Diam: 2.00 cm Manish Patwardhan MD Electronically signed by Vernell Leep MD Signature Date/Time: 09/23/2020/10:15:02 AM    Final       Medications:     Scheduled Medications: . acetaminophen  1,000 mg Oral Q6H   Or  . acetaminophen (TYLENOL) oral liquid 160 mg/5 mL  1,000 mg Per Tube Q6H  . acetaminophen (TYLENOL) oral liquid 160 mg/5 mL  650 mg Per Tube Once   Or  . acetaminophen  650 mg Rectal Once  . aspirin EC  325 mg Oral Daily   Or  . aspirin  324 mg Per Tube Daily  . atorvastatin  80 mg Oral Daily  . bisacodyl  10 mg Oral Daily   Or  . bisacodyl  10 mg Rectal Daily  . chlorhexidine  gluconate (MEDLINE KIT)  15 mL Mouth Rinse BID  . Chlorhexidine Gluconate Cloth  6 each Topical Daily  . clonazePAM  2 mg Per Tube Once  . docusate sodium  200 mg Oral Daily  . gabapentin  400 mg Per Tube Once  . mouth rinse  15 mL Mouth Rinse 10 times per day  . metoprolol tartrate  12.5 mg Oral BID   Or  . metoprolol tartrate  12.5 mg Per Tube BID  . oxyCODONE  5 mg Per Tube Once  . [START ON 09/25/2020] pantoprazole  40 mg Oral Daily  . sodium chloride flush  10-40 mL Intracatheter Q12H  . sodium chloride flush  3 mL Intravenous Q12H     Infusions: . sodium chloride 20 mL/hr at 09/24/20 0900  . sodium chloride    . sodium chloride    . albumin human Stopped (09/24/20 0430)  .  ceFAZolin (ANCEF) IV Stopped (09/24/20 0547)  . dexmedetomidine (PRECEDEX) IV infusion 0.7 mcg/kg/hr (09/24/20 0900)  . DOBUTamine    .  famotidine (PEPCID) IV Stopped (09/23/20 2205)  . insulin 0.9 Units/hr (09/24/20 0700)  . lactated ringers    . lactated ringers 20 mL/hr at 09/24/20 0900  . lactated ringers 20 mL/hr at 09/24/20 0900  . niCARDipine    . nitroGLYCERIN    . norepinephrine (LEVOPHED) Adult infusion Stopped (09/24/20 0756)     PRN Medications:  sodium chloride, albumin human, dextrose, fentaNYL (SUBLIMAZE) injection, lactated ringers, metoprolol tartrate, midazolam, ondansetron (ZOFRAN) IV, sodium chloride flush, sodium chloride flush, traMADol   Assessment/Plan:   1. 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 NE restarted. Will start milrinone - CI low on swan (high on Flo-trak) - Stop Toprol. Take down Flo-trak for now. Will use swan to manage. With flattening of PA tracing (ok position on cxr), I worry about RV failure. Starting milrinone  - Follow Swan numbers closely - Will need diuresis once hemodynamics straightened out  2. NSTEMI with critical CAD - 95% LM and high grade ostial RCA - CABG on 5/6 for  criitcal LM disease (LIMA -> LAD, SVG -> OM, SVG -> RCA) - Continue ASA/statin   3. Acute hypoxic respiratory failure due to pulmonary edema - on vent - CCM following - Doubt he is ready to wean just yet until we get hemodynamics straightened out  4. AKI  - due to ATN - Cr 1.0 -> 1.7   CRITICAL CARE Performed by: Glori Bickers  Total critical care time: 60 minutes  Critical care time was exclusive of separately billable procedures and treating other patients.  Critical care was necessary to treat or prevent imminent or life-threatening deterioration.  Critical care was time spent personally by me (independent of midlevel providers or residents) on the following activities: development of treatment plan with patient and/or surrogate as well as nursing, discussions with consultants, evaluation of patient's response to treatment, examination of patient, obtaining history from patient or surrogate, ordering and performing treatments and interventions, ordering and review of laboratory studies, ordering and review of radiographic studies, pulse oximetry and re-evaluation of patient's condition.   Length of Stay: 1   Glori Bickers MD 09/24/2020, 10:09 AM  Advanced Heart Failure Team Pager [phone] (M-F; 7a - 4p)  Please contact [facility] for night-coverage after hours (4p -7a ) and weekends on [email or web address]

## 2020-09-25 ENCOUNTER — Other Ambulatory Visit (HOSPITAL_COMMUNITY): Payer: No Typology Code available for payment source

## 2020-09-25 ENCOUNTER — Inpatient Hospital Stay (HOSPITAL_COMMUNITY): Payer: No Typology Code available for payment source

## 2020-09-25 DIAGNOSIS — R0603 Acute respiratory distress: Secondary | ICD-10-CM | POA: Diagnosis not present

## 2020-09-25 LAB — POCT I-STAT 7, (LYTES, BLD GAS, ICA,H+H)
Acid-base deficit: 6 mmol/L — ABNORMAL HIGH (ref 0.0–2.0)
Acid-base deficit: 10 mmol/L — ABNORMAL HIGH (ref 0.0–2.0)
Acid-base deficit: 9 mmol/L — ABNORMAL HIGH (ref 0.0–2.0)
Acid-base deficit: 5 mmol/L — ABNORMAL HIGH (ref 0.0–2.0)
Acid-base deficit: 6 mmol/L — ABNORMAL HIGH (ref 0.0–2.0)
Bicarbonate: 20.1 mmol/L (ref 20.0–28.0)
Bicarbonate: 17.4 mmol/L — ABNORMAL LOW (ref 20.0–28.0)
Bicarbonate: 19.6 mmol/L — ABNORMAL LOW (ref 20.0–28.0)
Bicarbonate: 20.2 mmol/L (ref 20.0–28.0)
Bicarbonate: 17.6 mmol/L — ABNORMAL LOW (ref 20.0–28.0)
Calcium, Ion: 1.2 mmol/L (ref 1.15–1.40)
Calcium, Ion: 1.09 mmol/L — ABNORMAL LOW (ref 1.15–1.40)
Calcium, Ion: 1.01 mmol/L — ABNORMAL LOW (ref 1.15–1.40)
Calcium, Ion: 1.01 mmol/L — ABNORMAL LOW (ref 1.15–1.40)
Calcium, Ion: 1.02 mmol/L — ABNORMAL LOW (ref 1.15–1.40)
HCT: 21 % — ABNORMAL LOW (ref 39.0–52.0)
HCT: 29 % — ABNORMAL LOW (ref 39.0–52.0)
HCT: 30 % — ABNORMAL LOW (ref 39.0–52.0)
HCT: 34 % — ABNORMAL LOW (ref 39.0–52.0)
HCT: 29 % — ABNORMAL LOW (ref 39.0–52.0)
Hemoglobin: 7.1 g/dL — ABNORMAL LOW (ref 13.0–17.0)
Hemoglobin: 9.9 g/dL — ABNORMAL LOW (ref 13.0–17.0)
Hemoglobin: 10.2 g/dL — ABNORMAL LOW (ref 13.0–17.0)
Hemoglobin: 11.6 g/dL — ABNORMAL LOW (ref 13.0–17.0)
Hemoglobin: 9.9 g/dL — ABNORMAL LOW (ref 13.0–17.0)
O2 Saturation: 99 %
O2 Saturation: 96 %
O2 Saturation: 100 %
O2 Saturation: 100 %
O2 Saturation: 100 %
Patient temperature: 36
Patient temperature: 37.1
Patient temperature: 36.7
Patient temperature: 36.5
Patient temperature: 36.5
Potassium: 3.3 mmol/L — ABNORMAL LOW (ref 3.5–5.1)
Potassium: 4.1 mmol/L (ref 3.5–5.1)
Potassium: 4.2 mmol/L (ref 3.5–5.1)
Potassium: 3.9 mmol/L (ref 3.5–5.1)
Potassium: 3.9 mmol/L (ref 3.5–5.1)
Sodium: 137 mmol/L (ref 135–145)
Sodium: 133 mmol/L — ABNORMAL LOW (ref 135–145)
Sodium: 133 mmol/L — ABNORMAL LOW (ref 135–145)
Sodium: 134 mmol/L — ABNORMAL LOW (ref 135–145)
Sodium: 134 mmol/L — ABNORMAL LOW (ref 135–145)
TCO2: 21 mmol/L — ABNORMAL LOW (ref 22–32)
TCO2: 19 mmol/L — ABNORMAL LOW (ref 22–32)
TCO2: 21 mmol/L — ABNORMAL LOW (ref 22–32)
TCO2: 21 mmol/L — ABNORMAL LOW (ref 22–32)
TCO2: 18 mmol/L — ABNORMAL LOW (ref 22–32)
pCO2 arterial: 43.1 mmHg (ref 32.0–48.0)
pCO2 arterial: 45.9 mmHg (ref 32.0–48.0)
pCO2 arterial: 51.8 mmHg — ABNORMAL HIGH (ref 32.0–48.0)
pCO2 arterial: 38.1 mmHg (ref 32.0–48.0)
pCO2 arterial: 28.3 mmHg — ABNORMAL LOW (ref 32.0–48.0)
pH, Arterial: 7.272 — ABNORMAL LOW (ref 7.350–7.450)
pH, Arterial: 7.188 — CL (ref 7.350–7.450)
pH, Arterial: 7.185 — CL (ref 7.350–7.450)
pH, Arterial: 7.329 — ABNORMAL LOW (ref 7.350–7.450)
pH, Arterial: 7.4 (ref 7.350–7.450)
pO2, Arterial: 142 mmHg — ABNORMAL HIGH (ref 83.0–108.0)
pO2, Arterial: 99 mmHg (ref 83.0–108.0)
pO2, Arterial: 307 mmHg — ABNORMAL HIGH (ref 83.0–108.0)
pO2, Arterial: 444 mmHg — ABNORMAL HIGH (ref 83.0–108.0)
pO2, Arterial: 206 mmHg — ABNORMAL HIGH (ref 83.0–108.0)

## 2020-09-25 LAB — GLUCOSE, CAPILLARY
Glucose-Capillary: 158 mg/dL — ABNORMAL HIGH (ref 70–99)
Glucose-Capillary: 147 mg/dL — ABNORMAL HIGH (ref 70–99)
Glucose-Capillary: 158 mg/dL — ABNORMAL HIGH (ref 70–99)
Glucose-Capillary: 123 mg/dL — ABNORMAL HIGH (ref 70–99)
Glucose-Capillary: 102 mg/dL — ABNORMAL HIGH (ref 70–99)
Glucose-Capillary: 92 mg/dL (ref 70–99)
Glucose-Capillary: 92 mg/dL (ref 70–99)
Glucose-Capillary: 84 mg/dL (ref 70–99)
Glucose-Capillary: 217 mg/dL — ABNORMAL HIGH (ref 70–99)
Glucose-Capillary: 234 mg/dL — ABNORMAL HIGH (ref 70–99)
Glucose-Capillary: 213 mg/dL — ABNORMAL HIGH (ref 70–99)

## 2020-09-25 LAB — CBC
HCT: 24.7 % — ABNORMAL LOW (ref 39.0–52.0)
HCT: 34 % — ABNORMAL LOW (ref 39.0–52.0)
Hemoglobin: 7.8 g/dL — ABNORMAL LOW (ref 13.0–17.0)
Hemoglobin: 10.8 g/dL — ABNORMAL LOW (ref 13.0–17.0)
MCH: 26.8 pg (ref 26.0–34.0)
MCH: 26.9 pg (ref 26.0–34.0)
MCHC: 31.6 g/dL (ref 30.0–36.0)
MCHC: 31.8 g/dL (ref 30.0–36.0)
MCV: 84.9 fL (ref 80.0–100.0)
MCV: 84.8 fL (ref 80.0–100.0)
Platelets: 119 10*3/uL — ABNORMAL LOW (ref 150–400)
Platelets: 83 10*3/uL — ABNORMAL LOW (ref 150–400)
RBC: 2.91 MIL/uL — ABNORMAL LOW (ref 4.22–5.81)
RBC: 4.01 MIL/uL — ABNORMAL LOW (ref 4.22–5.81)
RDW: 17.2 % — ABNORMAL HIGH (ref 11.5–15.5)
RDW: 16.5 % — ABNORMAL HIGH (ref 11.5–15.5)
WBC: 10.2 10*3/uL (ref 4.0–10.5)
WBC: 7.6 10*3/uL (ref 4.0–10.5)
nRBC: 0.8 % — ABNORMAL HIGH (ref 0.0–0.2)
nRBC: 3 % — ABNORMAL HIGH (ref 0.0–0.2)

## 2020-09-25 LAB — BASIC METABOLIC PANEL
Anion gap: 9 (ref 5–15)
Anion gap: 11 (ref 5–15)
BUN: 49 mg/dL — ABNORMAL HIGH (ref 8–23)
BUN: 55 mg/dL — ABNORMAL HIGH (ref 8–23)
CO2: 21 mmol/L — ABNORMAL LOW (ref 22–32)
CO2: 18 mmol/L — ABNORMAL LOW (ref 22–32)
Calcium: 7.5 mg/dL — ABNORMAL LOW (ref 8.9–10.3)
Calcium: 7.2 mg/dL — ABNORMAL LOW (ref 8.9–10.3)
Chloride: 105 mmol/L (ref 98–111)
Chloride: 103 mmol/L (ref 98–111)
Creatinine, Ser: 1.95 mg/dL — ABNORMAL HIGH (ref 0.61–1.24)
Creatinine, Ser: 2.34 mg/dL — ABNORMAL HIGH (ref 0.61–1.24)
GFR, Estimated: 35 mL/min — ABNORMAL LOW (ref >60)
GFR, Estimated: 28 mL/min — ABNORMAL LOW (ref >60)
Glucose, Bld: 141 mg/dL — ABNORMAL HIGH (ref 70–99)
Glucose, Bld: 217 mg/dL — ABNORMAL HIGH (ref 70–99)
Potassium: 3.3 mmol/L — ABNORMAL LOW (ref 3.5–5.1)
Potassium: 3.9 mmol/L (ref 3.5–5.1)
Sodium: 135 mmol/L (ref 135–145)
Sodium: 132 mmol/L — ABNORMAL LOW (ref 135–145)

## 2020-09-25 LAB — HEMOGLOBIN AND HEMATOCRIT, BLOOD
HCT: 31.6 % — ABNORMAL LOW (ref 39.0–52.0)
Hemoglobin: 10.3 g/dL — ABNORMAL LOW (ref 13.0–17.0)

## 2020-09-25 LAB — COOXEMETRY PANEL
Carboxyhemoglobin: 1 % (ref 0.5–1.5)
Carboxyhemoglobin: 1.3 % (ref 0.5–1.5)
Methemoglobin: 0.7 % (ref 0.0–1.5)
Methemoglobin: 1 % (ref 0.0–1.5)
O2 Saturation: 81.4 %
O2 Saturation: 69.3 %
Total hemoglobin: 7.4 g/dL — ABNORMAL LOW (ref 12.0–16.0)
Total hemoglobin: 8.2 g/dL — ABNORMAL LOW (ref 12.0–16.0)

## 2020-09-25 LAB — PHOSPHORUS
Phosphorus: 4.4 mg/dL (ref 2.5–4.6)
Phosphorus: 3.5 mg/dL (ref 2.5–4.6)

## 2020-09-25 LAB — LIPID PANEL
Cholesterol: 65 mg/dL (ref 0–200)
HDL: 13 mg/dL — ABNORMAL LOW (ref >40)
LDL Cholesterol: 28 mg/dL (ref 0–99)
Total CHOL/HDL Ratio: 5 RATIO
Triglycerides: 118 mg/dL (ref <150)
VLDL: 24 mg/dL (ref 0–40)

## 2020-09-25 LAB — LACTIC ACID, PLASMA
Lactic Acid, Venous: 0.9 mmol/L (ref 0.5–1.9)
Lactic Acid, Venous: 3.1 mmol/L (ref 0.5–1.9)
Lactic Acid, Venous: 4 mmol/L (ref 0.5–1.9)

## 2020-09-25 LAB — HEMOGLOBIN A1C
Hgb A1c MFr Bld: 6.3 % — ABNORMAL HIGH (ref 4.8–5.6)
Mean Plasma Glucose: 134.11 mg/dL

## 2020-09-25 LAB — MAGNESIUM
Magnesium: 3.1 mg/dL — ABNORMAL HIGH (ref 1.7–2.4)
Magnesium: 2.7 mg/dL — ABNORMAL HIGH (ref 1.7–2.4)

## 2020-09-25 LAB — PREPARE RBC (CROSSMATCH)

## 2020-09-25 LAB — POCT ACTIVATED CLOTTING TIME: Activated Clotting Time: 136 seconds

## 2020-09-25 MED ORDER — POTASSIUM CHLORIDE 10 MEQ/50ML IV SOLN
10.0000 meq | INTRAVENOUS | Status: AC
  Administered 2020-09-25 (×3): 10 meq via INTRAVENOUS
  Filled 2020-09-25 (×3): qty 50

## 2020-09-25 MED ORDER — ALBUTEROL SULFATE (2.5 MG/3ML) 0.083% IN NEBU
2.5000 mg | INHALATION_SOLUTION | RESPIRATORY_TRACT | Status: DC | PRN
  Administered 2020-10-11 – 2020-10-16 (×2): 2.5 mg via RESPIRATORY_TRACT
  Filled 2020-09-25 (×2): qty 3

## 2020-09-25 MED ORDER — AMIODARONE LOAD VIA INFUSION
150.0000 mg | Freq: Once | INTRAVENOUS | Status: AC
  Administered 2020-09-25: 150 mg via INTRAVENOUS
  Filled 2020-09-25: qty 83.34

## 2020-09-25 MED ORDER — INSULIN ASPART 100 UNIT/ML IJ SOLN
0.0000 [IU] | INTRAMUSCULAR | Status: DC
  Administered 2020-09-25 (×3): 8 [IU] via SUBCUTANEOUS
  Administered 2020-09-26: 4 [IU] via SUBCUTANEOUS
  Administered 2020-09-26: 2 [IU] via SUBCUTANEOUS
  Administered 2020-09-26: 4 [IU] via SUBCUTANEOUS
  Administered 2020-09-29 (×2): 2 [IU] via SUBCUTANEOUS
  Administered 2020-09-29: 4 [IU] via SUBCUTANEOUS
  Administered 2020-09-29 (×2): 2 [IU] via SUBCUTANEOUS
  Administered 2020-09-30: 8 [IU] via SUBCUTANEOUS
  Administered 2020-09-30: 4 [IU] via SUBCUTANEOUS
  Administered 2020-09-30: 2 [IU] via SUBCUTANEOUS
  Administered 2020-09-30: 4 [IU] via SUBCUTANEOUS
  Administered 2020-09-30: 2 [IU] via SUBCUTANEOUS
  Administered 2020-10-01: 8 [IU] via SUBCUTANEOUS
  Administered 2020-10-01 (×2): 2 [IU] via SUBCUTANEOUS
  Administered 2020-10-01 – 2020-10-02 (×6): 4 [IU] via SUBCUTANEOUS
  Administered 2020-10-02: 2 [IU] via SUBCUTANEOUS
  Administered 2020-10-02 – 2020-10-03 (×4): 4 [IU] via SUBCUTANEOUS
  Administered 2020-10-03 – 2020-10-04 (×6): 2 [IU] via SUBCUTANEOUS
  Administered 2020-10-04 (×2): 4 [IU] via SUBCUTANEOUS
  Administered 2020-10-04: 8 [IU] via SUBCUTANEOUS
  Administered 2020-10-04: 4 [IU] via SUBCUTANEOUS
  Administered 2020-10-05: 8 [IU] via SUBCUTANEOUS
  Administered 2020-10-05 (×2): 2 [IU] via SUBCUTANEOUS
  Administered 2020-10-05 (×2): 4 [IU] via SUBCUTANEOUS
  Administered 2020-10-05: 2 [IU] via SUBCUTANEOUS
  Administered 2020-10-06: 4 [IU] via SUBCUTANEOUS
  Administered 2020-10-06 (×2): 2 [IU] via SUBCUTANEOUS
  Administered 2020-10-06: 4 [IU] via SUBCUTANEOUS
  Administered 2020-10-06: 2 [IU] via SUBCUTANEOUS
  Administered 2020-10-06: 4 [IU] via SUBCUTANEOUS
  Administered 2020-10-07 (×2): 2 [IU] via SUBCUTANEOUS
  Administered 2020-10-07: 4 [IU] via SUBCUTANEOUS
  Administered 2020-10-07 (×3): 2 [IU] via SUBCUTANEOUS
  Administered 2020-10-08: 4 [IU] via SUBCUTANEOUS
  Administered 2020-10-08: 8 [IU] via SUBCUTANEOUS
  Administered 2020-10-08: 4 [IU] via SUBCUTANEOUS
  Administered 2020-10-08 – 2020-10-10 (×9): 2 [IU] via SUBCUTANEOUS
  Administered 2020-10-10: 4 [IU] via SUBCUTANEOUS
  Administered 2020-10-10 – 2020-10-11 (×5): 2 [IU] via SUBCUTANEOUS
  Administered 2020-10-11: 8 [IU] via SUBCUTANEOUS
  Administered 2020-10-11: 4 [IU] via SUBCUTANEOUS
  Administered 2020-10-12 (×2): 2 [IU] via SUBCUTANEOUS
  Administered 2020-10-12: 4 [IU] via SUBCUTANEOUS
  Administered 2020-10-12 – 2020-10-15 (×10): 2 [IU] via SUBCUTANEOUS
  Administered 2020-10-15: 4 [IU] via SUBCUTANEOUS
  Administered 2020-10-16 – 2020-10-20 (×18): 2 [IU] via SUBCUTANEOUS

## 2020-09-25 MED ORDER — FUROSEMIDE 10 MG/ML IJ SOLN
60.0000 mg | Freq: Two times a day (BID) | INTRAMUSCULAR | Status: DC
  Administered 2020-09-25: 60 mg via INTRAVENOUS
  Filled 2020-09-25: qty 6

## 2020-09-25 MED ORDER — VASOPRESSIN 20 UNITS/100 ML INFUSION FOR SHOCK
0.0000 [IU]/min | INTRAVENOUS | Status: DC
  Administered 2020-09-25 – 2020-09-26 (×3): 0.04 [IU]/min via INTRAVENOUS
  Administered 2020-09-26: 0.02 [IU]/min via INTRAVENOUS
  Filled 2020-09-25 (×2): qty 100

## 2020-09-25 MED ORDER — PIPERACILLIN-TAZOBACTAM 3.375 G IVPB
3.3750 g | Freq: Three times a day (TID) | INTRAVENOUS | Status: DC
  Administered 2020-09-25 – 2020-09-28 (×8): 3.375 g via INTRAVENOUS
  Filled 2020-09-25 (×8): qty 50

## 2020-09-25 MED ORDER — ALBUMIN HUMAN 5 % IV SOLN
INTRAVENOUS | Status: AC
  Filled 2020-09-25: qty 250

## 2020-09-25 MED ORDER — EPINEPHRINE 1 MG/10ML IJ SOSY
0.3000 mg | PREFILLED_SYRINGE | Freq: Once | INTRAMUSCULAR | Status: AC
  Administered 2020-09-25: 0.3 mg via INTRAVENOUS

## 2020-09-25 MED ORDER — AMIODARONE HCL IN DEXTROSE 360-4.14 MG/200ML-% IV SOLN
30.0000 mg/h | INTRAVENOUS | Status: DC
  Administered 2020-09-25 – 2020-09-26 (×4): 30 mg/h via INTRAVENOUS
  Administered 2020-09-27: 60 mg/h via INTRAVENOUS
  Administered 2020-09-27: 30 mg/h via INTRAVENOUS
  Administered 2020-09-28 (×2): 60 mg/h via INTRAVENOUS
  Administered 2020-09-28 – 2020-10-02 (×8): 30 mg/h via INTRAVENOUS
  Filled 2020-09-25 (×15): qty 200

## 2020-09-25 MED ORDER — PANTOPRAZOLE SODIUM 40 MG IV SOLR
40.0000 mg | INTRAVENOUS | Status: DC
  Administered 2020-09-25 – 2020-10-09 (×15): 40 mg via INTRAVENOUS
  Filled 2020-09-25 (×15): qty 40

## 2020-09-25 MED ORDER — SODIUM BICARBONATE 8.4 % IV SOLN
50.0000 meq | Freq: Once | INTRAVENOUS | Status: AC
  Administered 2020-09-25: 50 meq via INTRAVENOUS

## 2020-09-25 MED ORDER — FUROSEMIDE 10 MG/ML IJ SOLN
8.0000 mg/h | INTRAVENOUS | Status: DC
  Administered 2020-09-25 – 2020-09-28 (×4): 10 mg/h via INTRAVENOUS
  Administered 2020-09-29 – 2020-10-01 (×3): 8 mg/h via INTRAVENOUS
  Filled 2020-09-25 (×9): qty 20

## 2020-09-25 MED ORDER — POTASSIUM CHLORIDE 20 MEQ PO PACK: 40.0000 meq | PACK | Freq: Once | ORAL | Status: DC

## 2020-09-25 MED ORDER — SODIUM CHLORIDE 0.9% IV SOLUTION: Freq: Once | INTRAVENOUS | Status: DC

## 2020-09-25 MED ORDER — SODIUM BICARBONATE 8.4 % IV SOLN: 100.0000 meq | Freq: Once | INTRAVENOUS | Status: AC

## 2020-09-25 MED ORDER — SODIUM BICARBONATE 8.4 % IV SOLN
INTRAVENOUS | Status: AC
  Administered 2020-09-25: 100 meq via INTRAVENOUS
  Filled 2020-09-25: qty 50

## 2020-09-25 MED ORDER — AMIODARONE HCL IN DEXTROSE 360-4.14 MG/200ML-% IV SOLN
60.0000 mg/h | INTRAVENOUS | Status: AC
  Administered 2020-09-25 (×2): 60 mg/h via INTRAVENOUS
  Filled 2020-09-25 (×2): qty 200

## 2020-09-25 MED ORDER — NOREPINEPHRINE 16 MG/250ML-% IV SOLN
0.0000 ug/min | INTRAVENOUS | Status: DC
  Administered 2020-09-25: 40 ug/min via INTRAVENOUS
  Administered 2020-09-26: 6 ug/min via INTRAVENOUS
  Administered 2020-09-28: 13 ug/min via INTRAVENOUS
  Administered 2020-09-28: 12 ug/min via INTRAVENOUS
  Filled 2020-09-25 (×5): qty 250

## 2020-09-25 NOTE — Progress Notes (Signed)
NAME:  Jack Vance, MRN:  [id number], DOB:  [birth year], LOS: 2 ADMISSION DATE:  09/23/2020, CONSULTATION DATE:  09/24/2019 REFERRING MD:  Jacinto Halim, CHIEF COMPLAINT:  Acute coronary syndrome   History of Present Illness:  80 year old man who presented with cardiogenic shock from NSTEMI  Presented with new onset RSCP and severe dyspnea. Found to be in acute pulmonary edema and he was intubated and sent for urgent LHC which revealed 3V CAD.  IABP inserted and patient underwent urgent CABG.   Pertinent  Medical History   Past Medical History:  Diagnosis Date  . Arthritis   . Asthma   . DDD (degenerative disc disease), lumbar   . Hernia of abdominal wall   . RLS (restless legs syndrome)      Significant Hospital Events: Including procedures, antibiotic start and stop dates in addition to other pertinent events   . 5/6 CABG with Dr Cliffton Asters with LIMA to LAD, SVG to RCA d and SVG to ramus. LV normal and returned to ICU with no vasopressor support.  . No post-operative bleeding but poor RV performance on hemodynamics.   Interim History / Subjective:  More settled on current sedation regimen. Episode of emesis followed by increased vasopressor requirements.   Objective   Blood pressure (!) 120/47, pulse 100, temperature 99.14 F (37.3 C), resp. rate 18, height 5\' 6"  (1.676 m), weight 87.1 kg, SpO2 96 %. PAP: (24-40)/(14-27) 39/19 CVP:  [9 mmHg-34 mmHg] 34 mmHg CO:  [7.2 L/min-10.2 L/min] 7.2 L/min CI:  [3.7 L/min/m2-5.3 L/min/m2] 3.7 L/min/m2  Vent Mode: SIMV;PRVC;PSV FiO2 (%):  [40 %] 40 % Set Rate:  [12 bmp-18 bmp] 18 bmp Vt Set:  [510 mL] 510 mL PEEP:  [5 cmH20] 5 cmH20 Pressure Support:  [10 cmH20] 10 cmH20 Plateau Pressure:  [10 cmH20-27 cmH20] 21 cmH20   Intake/Output Summary (Last 24 hours) at 09/25/2020 1717 Last data filed at 09/25/2020 1600 Gross per 24 hour  Intake 5312.06 ml  Output 1040 ml  Net 4272.06 ml   Filed Weights   09/23/20 0251 09/25/20 0530  Weight: 83  kg 87.1 kg    Examination: General: obese, chronically ill man intubated and sedated.  HENT: OGT.ETT in place.  Lungs: crackles at both bases,   Cardiovascular: Extremities cool, + rub + IABP, midline incision intact, minimal chest tube drainage.  Abdomen: protuberant, soft.  Extremities: no edema.  Neuro: sedated with minimal response to painful stimulation..  GU: foley in place with clear urine.   Labs/imaging that I havepersonally reviewed  (right click and "Reselect all SmartList Selections" daily)  CXR shows mild pulmonary edema/atelectasis.  IABP appears low on CXR  Resolved Hospital Problem list   NSTEMI  Assessment & Plan:  Critically ill due to acute hypoxic and hypercapnic respiratory failure requiring mechanical ventilation Possible Aspiration pneumonia.  Critically ill due to RV dysfunction following ACS and CPB NSTEMI with culprit RCA involvement ( RV involvement?) Agitated delirium Alcohol abuse. CAD  Plan:  - Keep intubated for today until RV recovers - Titrate milrinone and epinephrine to support RV - Vasopressin and norepinephrine to keep MAP >65 - Follow KUB for possible evolving ileus - ischemia from IABP - Initiate multimodal pain control and enteral sedation to control agitation.  - No change in BP with IABP wean but given decompensation following emesis will continue for now.  - Transfuse for HB 7.1 - Continue antibiotics for possible aspiration, oxygenation has not changed, so suspect problem causing increased vasopressor requirements may be  elsewhere.   Best practice (right click and "Reselect all SmartList Selections" daily)  Diet:  NPO NGT to suction. Pain/Anxiety/Delirium protocol (if indicated): Yes (RASS goal -3) VAP protocol (if indicated): Yes DVT prophylaxis: Subcutaneous Heparin to start tomorrow.  GI prophylaxis: PPI Glucose control:  SSI Yes Central venous access:  Yes, and it is still needed Arterial line:  Yes, and it is still  needed Foley:  Yes, and it is still needed Mobility:  bed rest  PT consulted: N/A Last date of multidisciplinary goals of care discussion [will update. ] Code Status:  full code Disposition: ICU  CRITICAL CARE Performed by: Lynnell Catalan   Total critical care time: 40 minutes  Critical care time was exclusive of separately billable procedures and treating other patients.  Critical care was necessary to treat or prevent imminent or life-threatening deterioration.  Critical care was time spent personally by me on the following activities: development of treatment plan with patient and/or surrogate as well as nursing, discussions with consultants, evaluation of patient's response to treatment, examination of patient, obtaining history from patient or surrogate, ordering and performing treatments and interventions, ordering and review of laboratory studies, ordering and review of radiographic studies, pulse oximetry, re-evaluation of patient's condition and participation in multidisciplinary rounds.  Lynnell Catalan, MD [facility] ICU Physician [facility] [place] [facility]  Pager: [phone] Mobile: [phone] After hours: [phone].

## 2020-09-25 NOTE — Progress Notes (Addendum)
[address]       [name] [address]             [phone]        CARDIOTHORACIC SURGERY PROGRESS NOTE   R2 Days Post-Op Procedure(s) (LRB): CORONARY ARTERY BYPASS GRAFTING (CABG)  TIMES 4 USING LEFT GREATER SAPHENOUS VEIN HARVESTED ENDOSCOPICALLY AND LEFT INTERNAL MAMMARY ARTERY (N/A) TRANSESOPHAGEAL ECHOCARDIOGRAM (TEE) (N/A)  Subjective: No events overnight.  Sedated on vent.  Reportedly not following commands when more alert but moving everything w/ purpose and strength  Objective: Vital signs: BP Readings from Last 1 Encounters:  09/25/20 115/68   Pulse Readings from Last 1 Encounters:  09/25/20 (!) 102   Resp Readings from Last 1 Encounters:  09/25/20 11   Temp Readings from Last 1 Encounters:  09/25/20 98.24 F (36.8 C)    Hemodynamics: PAP: (18-40)/(13-27) 39/26 CVP:  [9 mmHg-33 mmHg] 21 mmHg CO:  [7.2 L/min-10.2 L/min] 7.2 L/min CI:  [3.7 L/min/m2-5.3 L/min/m2] 3.7 L/min/m2  Mixed venous co-ox 81%  Physical Exam:  Rhythm:   Sinus tach  Breath sounds: Coarse, symmetrical  Heart sounds:  RRR  Incisions:  Dressing dry, intact  Abdomen:  Soft, non-distended  Extremities:  Warm, adequately perfused  Chest tubes:  low volume thin serosanguinous output, no air leak    Intake/Output from previous day: 05/07 0701 - 05/08 0700 In: 3670.4 [I.V.:2437.6; NG/GT:730; IV Piggyback:502.8] Out: 1290 [Urine:850; Emesis/NG output:30; Chest Tube:410] Intake/Output this shift: Total I/O In: 133.1 [I.V.:99; IV Piggyback:34.1] Out: 135 [Urine:75; Chest Tube:60]  Lab Results:  CBC: Recent Labs    09/24/20 1516 09/24/20 2002 09/25/20 0355 09/25/20 0402  WBC 16.0*  --  10.2  --   HGB 8.2*   < > 7.8* 7.1*  HCT 25.6*   < > 24.7* 21.0*  PLT 121*  --  119*  --    < > = values in this interval not displayed.    BMET:  Recent Labs    09/24/20 1516 09/24/20 2002 09/25/20 0355 09/25/20 0402  NA 135   < > 135 137  K 3.8   < > 3.3* 3.3*   CL 107  --  105  --   CO2 21*  --  21*  --   GLUCOSE 141*  --  141*  --   BUN 43*  --  49*  --   CREATININE 1.83*  --  1.95*  --   CALCIUM 7.9*  --  7.5*  --    < > = values in this interval not displayed.     PT/INR:   Recent Labs    09/23/20 1839  LABPROT 18.1*  INR 1.5*    CBG (last 3)  Recent Labs    09/25/20 0552 09/25/20 0740 09/25/20 0900  GLUCAP 123* 92 102*    ABG    Component Value Date/Time   PHART 7.272 (L) 09/25/2020 0402   PCO2ART 43.1 09/25/2020 0402   PO2ART 142 (H) 09/25/2020 0402   HCO3 20.1 09/25/2020 0402   TCO2 21 (L) 09/25/2020 0402   ACIDBASEDEF 6.0 (H) 09/25/2020 0402   O2SAT 81.4 09/25/2020 0406    CXR: PORTABLE CHEST 1 VIEW  COMPARISON:  09/24/2020 and earlier exams.  FINDINGS: On stable changes from recent CABG surgery. No mediastinal widening.  Patchy interstitial and hazy airspace lung opacities are noted, with more confluent opacity in the left medial lung base, appearance similar to the exams from 05/07 and 09/23/2020.  No  pneumothorax.  Swan-Ganz catheter tip projects in the peripheral right pulmonary artery. Right internal jugular central venous line, endotracheal tube and nasal/orogastric tube are stable and well positioned.  IMPRESSION: 1. No change in the bilateral interstitial and hazy airspace lung opacities favored to reflect pulmonary edema. No new lung abnormalities. 2. No pneumothorax. 3. Tip of the Swan-Ganz catheter appears more peripheral than on the prior exams. No other change in support apparatus.   Electronically Signed   By: Amie Portland M.D.   On: 09/25/2020 08:25  Assessment/Plan: S/P Procedure(s) (LRB): CORONARY ARTERY BYPASS GRAFTING (CABG)  TIMES 4 USING LEFT GREATER SAPHENOUS VEIN HARVESTED ENDOSCOPICALLY AND LEFT INTERNAL MAMMARY ARTERY (N/A) TRANSESOPHAGEAL ECHOCARDIOGRAM (TEE) (N/A)  Overall stable POD2 Maintaining NSR w/ stable hemodynamics IABP 1:1 on milronone 0.25 very  low dose Epi and Levophed for BP support, co-ox 81% and PA pressures relatively low O2 sats 91-99% on 40% FiO2 CXR w/ mild-moderate opacity c/w CHF Acute systolic CHF with expected post-op volume excess Expected post op acute blood loss anemia, Hgb down slightly 7.8   Agree w/ plans to proceed w/ IABP wean and removal prior to attempts at acute vent wean  Needs diuresis  D/C chest tubes  Consider transfusion PRBC's if Hgb drops any further   Purcell Nails, MD 09/25/2020 9:48 AM

## 2020-09-25 NOTE — Progress Notes (Addendum)
Dr. Denese Killings ordered vasopressin at .04 and placed IABP at 1:2. Dr.'s Agarwala, Shirlee Latch and Cornelius Moras made aware that patient is requiring significant pressor support to maintain MAP.

## 2020-09-25 NOTE — Progress Notes (Addendum)
RN discussed patient's abdominal hernia with Dr. Denese Killings during morning rounds and that it had become larger overnight. RN expressed concern about possible ileus. Dr. Denese Killings explained that it is best practice to continue feeding to reslove ileus, tube feed continued at ordered amount of 34mL. Dr. Denese Killings performed bedside echo. At 1130 Dr.'s Agarwala and Shirlee Latch ordered IABP changed to 1:3 to wean for balloon removal in the afternoon. BP dropped steadily over the next hour requiring more pressor support. Dr. Shirlee Latch paged at 1215 when patient's MAP in 50's and HR 130's. RN ordered by Dr. Shirlee Latch to return IABP to 1:1 and wean down pressor support. Patient also began vomiting tube feed, feeding on hold. RT called to verify tube feed in ETT airway suction tubing. RN paged Dr. Denese Killings and was ordered to place patient in Trendelenburg and OG tube to suction.

## 2020-09-25 NOTE — Progress Notes (Signed)
Advanced Heart Failure Rounding Note   Subjective:    Underwent emergent CABG on 5/6 for critical LM disease (LIMA -> LAD, SVG -> OM, SVG -> RCA)  Sedated currently on vent.   Seems more stable today.  Currently on milrinone 0.25, epinephrine 1, NE 1.  IABP currently 1:2. MAP stable, sinus 100s. Creatinine 1.83 => 1.95.   Bedside echo with EF around 30% with severe septal hypokinesis, normal RV size, mildly decreased RV systolic function.   Swan numbers: CVP 16 PA 37/28 CI 3.2 PAPi 0.6  Objective:   Weight Range:  Vital Signs:   Temp:  [94.82 F (34.9 C)-98.24 F (36.8 C)] 98.24 F (36.8 C) (05/08 0800) Pulse Rate:  [102] 102 (05/08 0737) Resp:  [8-37] 11 (05/08 0800) BP: (100-150)/(42-90) 115/68 (05/08 0900) SpO2:  [93 %-99 %] 99 % (05/08 0645) Arterial Line BP: (90-173)/(22-76) 159/36 (05/08 0800) FiO2 (%):  [40 %] 40 % (05/08 0737) Weight:  [87.1 kg] 87.1 kg (05/08 0530)    Weight change: Filed Weights   09/23/20 0251 09/25/20 0530  Weight: 83 kg 87.1 kg    Intake/Output:   Intake/Output Summary (Last 24 hours) at 09/25/2020 0940 Last data filed at 09/25/2020 0855 Gross per 24 hour  Intake 3651.96 ml  Output 1315 ml  Net 2336.96 ml     General: Sedated on vent Neck: JVP 16 cm, no thyromegaly or thyroid nodule.  Lungs: decreased at bases.  CV: Nondisplaced PMI.  Heart regular S1/S2, no S3/S4, no murmur.  1+ edema to knees. Abdomen: Soft, nontender, no hepatosplenomegaly, no distention.  Ventral hernia.  Skin: Intact without lesions or rashes.  Neurologic:Sedated on vent. Extremities: No clubbing or cyanosis.  HEENT: Normal.   Telemetry: Sinus 60-70s Personally reviewed   Labs: Basic Metabolic Panel: Recent Labs  Lab 09/23/20 0311 09/23/20 1043 09/23/20 1634 09/23/20 1713 09/23/20 1718 09/23/20 1842 09/24/20 0408 09/24/20 1516 09/24/20 2002 09/25/20 0355 09/25/20 0402  NA 137   < > 135   < > 137   < > 134* 135 136 135 137  K 4.1   <  > 4.2   < > 3.2*   < > 4.4 3.8 3.3* 3.3* 3.3*  CL 100   < > 98  --  100  --  106 107  --  105  --   CO2 21*  --   --   --   --   --  21* 21*  --  21*  --   GLUCOSE 140*   < > 164*  --  154*  --  116* 141*  --  141*  --   BUN 36*   < > 32*  --  30*  --  37* 43*  --  49*  --   CREATININE 1.23   < > 1.00  --  1.00  --  1.71* 1.83*  --  1.95*  --   CALCIUM 9.1  --   --   --   --   --  7.7* 7.9*  --  7.5*  --   MG  --   --   --   --   --   --  3.6* 3.3*  --  3.1*  --   PHOS  --   --   --   --   --   --   --  5.1*  --  4.4  --    < > = values in this interval not displayed.  Liver Function Tests: Recent Labs  Lab 09/23/20 0311  AST 131*  ALT 94*  ALKPHOS 77  BILITOT 0.9  PROT 7.2  ALBUMIN 3.5   No results for input(s): LIPASE, AMYLASE in the last 168 hours. No results for input(s): AMMONIA in the last 168 hours.  CBC: Recent Labs  Lab 09/23/20 0311 09/23/20 0859 09/23/20 1553 09/23/20 1617 09/23/20 1839 09/23/20 1842 09/24/20 0408 09/24/20 1516 09/24/20 2002 09/25/20 0355 09/25/20 0402  WBC 14.5*  --   --   --  18.9*  --  14.9* 16.0*  --  10.2  --   NEUTROABS 12.5*  --   --   --   --   --   --   --   --   --   --   HGB 7.5*   < > 7.7*   < > 10.5*   < > 8.5* 8.2* 7.8* 7.8* 7.1*  HCT 24.7*   < > 23.8*   < > 32.6*   < > 27.0* 25.6* 23.0* 24.7* 21.0*  MCV 80.7  --   --   --  83.6  --  83.9 83.9  --  84.9  --   PLT 269  --  159  --  153  --  133* 121*  --  119*  --    < > = values in this interval not displayed.    Cardiac Enzymes: Recent Labs  Lab 09/23/20 0554  CKTOTAL 455*  CKMB 65.8*    BNP: BNP (last 3 results) Recent Labs    09/23/20 0311  BNP 1,191.8*    ProBNP (last 3 results) No results for input(s): PROBNP in the last 8760 hours.    Other results:  Imaging: DG Abd 1 View  Result Date: 09/25/2020 CLINICAL DATA:  Ileus. EXAM: ABDOMEN - 1 VIEW COMPARISON:  One-view chest x-ray 09/25/2020 and 09/24/2020. FINDINGS: Mildly dilated loops of bowel  are present in the central abdomen. Bowel gas pattern is otherwise unremarkable. No definite free air is present on the supine images. Side port of the NG tube is in the stomach. Inferior Swan-Ganz catheter terminates distally in the right pulmonary artery, unchanged. Bilateral pleural effusions noted. IMPRESSION: 1. Mildly dilated loops of bowel in the central abdomen compatible with ileus. 2. Bilateral pleural effusions. Electronically Signed   By: San Morelle M.D.   On: 09/25/2020 09:01   CARDIAC CATHETERIZATION  Result Date: 09/23/2020  Ost RCA lesion is 80% stenosed.  Ost LM to Mid LM lesion is 90% stenosed.  1st Diag lesion is 60% stenosed.  Ost Cx to Prox Cx lesion is 90% stenosed.  LV end diastolic pressure is severely elevated.  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  Right and left heart catheterization 09/23/2020: RA 10/9, mean 9 mmHg. RV 50/7, EDP 11 mmHg PA 48/26, mean 36 mmHg.  PA saturation 69%. PW 26/34, mean 26 mmHg.  Aortic saturation 100%. Cardiac output 8.1, cardiac index 4.21 by Fick.  Patient on the ventilator. LV: 108/15, EDP 27 mmHg.  There was no pressure gradient across the aortic valve. LM: Irregular, ulcerated, 90% ostial stenosis and proximal stenosis.  Extends into the circumflex coronary artery. LAD: Very minimal disease.  D1 is large, proximal 50 to 60% stenosis. RI: Ostium has 50 to 60% stenosis. CX: Ulcerated ostial 90% stenosis. RCA: Ostium 80% stenosis.  Severe damping with engagement of the artery with no backflow. Impression: Patient in cardiogenic shock however cardiac output and index are maintained on pressor  support, has surgical disease and has been evaluated by Dr. Kipp Brood and will be taken emergently for CABG.  Intra-aortic balloon pump was placed for hemodynamic support and stability.  Further recommendations to follow.  I have discussed with his sister Diane on the phone.  20 mL contrast utilized to limit contrast  nephropathy in a patient with cardiogenic shock. I appreciate Dr. Pierre Bali assisting me with management of patient was seen cardiogenic shock and critically ill.  DG CHEST PORT 1 VIEW  Result Date: 09/25/2020 CLINICAL DATA:  Follow-up.  Recent CABG surgery. EXAM: PORTABLE CHEST 1 VIEW COMPARISON:  09/24/2020 and earlier exams. FINDINGS: On stable changes from recent CABG surgery. No mediastinal widening. Patchy interstitial and hazy airspace lung opacities are noted, with more confluent opacity in the left medial lung base, appearance similar to the exams from 05/07 and 09/23/2020. No pneumothorax. Swan-Ganz catheter tip projects in the peripheral right pulmonary artery. Right internal jugular central venous line, endotracheal tube and nasal/orogastric tube are stable and well positioned. IMPRESSION: 1. No change in the bilateral interstitial and hazy airspace lung opacities favored to reflect pulmonary edema. No new lung abnormalities. 2. No pneumothorax. 3. Tip of the Swan-Ganz catheter appears more peripheral than on the prior exams. No other change in support apparatus. Electronically Signed   By: Lajean Manes M.D.   On: 09/25/2020 08:25   DG Chest Port 1 View  Result Date: 09/24/2020 CLINICAL DATA:  Status post three-vessel CABG EXAM: PORTABLE CHEST 1 VIEW COMPARISON:  Chest radiograph from one day prior. FINDINGS: Endotracheal tube tip is 2.5 cm above the carina. Enteric tube enters stomach with the tip not seen on this image. Right internal jugular central venous catheter terminates in the middle third of the SVC. Inferior approach Swan-Ganz catheter terminates over the right pulmonary artery. Intact sternotomy wires. Stable mediastinal drain and bibasilar chest tubes. Stable cardiomediastinal silhouette with mild cardiomegaly. No pneumothorax. No significant pleural effusions. Mild patchy hazy and reticular opacities throughout both lungs, similar. IMPRESSION: 1. No pneumothorax. Well-positioned  support structures. 2. Stable mild cardiomegaly with mild patchy hazy and reticular opacities in both lungs, favor mild pulmonary edema superimposed on underlying nonspecific pulmonary fibrosis. Electronically Signed   By: Ilona Sorrel M.D.   On: 09/24/2020 08:32   DG Chest Port 1 View  Result Date: 09/23/2020 CLINICAL DATA:  Status post coronary bypass grafting EXAM: PORTABLE CHEST 1 VIEW COMPARISON:  Film from earlier in the same day. FINDINGS: Postsurgical changes are now seen. Endotracheal tube, gastric catheter, Swan-Ganz catheter and pericardial drain are seen mediastinal drain is noted as well. Intra-aortic balloon pump is noted in satisfactory position at the level of the aortic arch. Patchy airspace opacity is noted but improved from the previous exam. No pneumothorax is seen. Right jugular catheter is noted as well. IMPRESSION: Patchy but improved airspace opacities bilaterally. Tubes and lines as described above. Electronically Signed   By: Inez Catalina M.D.   On: 09/23/2020 19:14     Medications:     Scheduled Medications: . acetaminophen  1,000 mg Oral Q6H   Or  . acetaminophen (TYLENOL) oral liquid 160 mg/5 mL  1,000 mg Per Tube Q6H  . acetaminophen (TYLENOL) oral liquid 160 mg/5 mL  650 mg Per Tube Once   Or  . acetaminophen  650 mg Rectal Once  . aspirin EC  325 mg Oral Daily   Or  . aspirin  324 mg Per Tube Daily  . atorvastatin  80 mg Oral Daily  .  bisacodyl  10 mg Oral Daily   Or  . bisacodyl  10 mg Rectal Daily  . chlorhexidine gluconate (MEDLINE KIT)  15 mL Mouth Rinse BID  . Chlorhexidine Gluconate Cloth  6 each Topical Daily  . clonazePAM  0.5 mg Per Tube BID  . docusate sodium  200 mg Oral Daily  . feeding supplement (PROSource TF)  45 mL Per Tube BID  . feeding supplement (VITAL HIGH PROTEIN)  1,000 mL Per Tube Q24H  . furosemide  60 mg Intravenous BID  . gabapentin  300 mg Per Tube BID  . ipratropium-albuterol  3 mL Nebulization Q6H  . mouth rinse  15 mL  Mouth Rinse 10 times per day  . multivitamin with minerals  1 tablet Per Tube Daily  . pantoprazole  40 mg Oral Daily  . potassium chloride  40 mEq Per Tube Once  . QUEtiapine  25 mg Per Tube BID  . rOPINIRole  2 mg Per Tube QHS  . sodium chloride flush  10-40 mL Intracatheter Q12H  . sodium chloride flush  3 mL Intravenous Q12H  . thiamine  100 mg Per Tube Daily    Infusions: . sodium chloride Stopped (09/25/20 0720)  . sodium chloride    . sodium chloride 20 mL/hr at 09/25/20 0600  .  ceFAZolin (ANCEF) IV 200 mL/hr at 09/25/20 0800  . dexmedetomidine (PRECEDEX) IV infusion 0.7 mcg/kg/hr (09/25/20 0814)  . epinephrine 1 mcg/min (09/25/20 0800)  . fentaNYL infusion INTRAVENOUS 200 mcg/hr (09/25/20 0800)  . insulin 1.9 Units/hr (09/25/20 0600)  . lactated ringers    . lactated ringers 20 mL/hr at 09/25/20 0800  . lactated ringers Stopped (09/24/20 1100)  . midazolam 2 mg/hr (09/25/20 0800)  . milrinone 0.25 mcg/kg/min (09/25/20 0800)  . nitroGLYCERIN    . norepinephrine (LEVOPHED) Adult infusion 1 mcg/min (09/25/20 0800)    PRN Medications: sodium chloride, dextrose, fentaNYL (SUBLIMAZE) injection, lactated ringers, metoprolol tartrate, midazolam, ondansetron (ZOFRAN) IV, sodium chloride flush, sodium chloride flush, traMADol   Assessment/Plan:   1. 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  - MAP stable this morning, co-ox 81% with CI 3.2, lactate normal.  He is on NE 1, epinephrine 1, milrinone 0.25, IABP 1:2. - Repeat co-ox with IABP 1:2, will wean to 1:3 if stable and aim to pull this afternoon.  - Continue current pressors/inotropes, will maintain on low dose epinephrine and milrinone now for RV dysfunction (PAPi remains low at 0.6 though RV only looks mildly hypokinetic on echo).  - Continue Swan.  -  CVP 16, start Lasix 60 mg IV bid today and follow response. Replacing K.   2. NSTEMI with critical CAD - 95% LM and high grade ostial RCA - CABG on 5/6 for critical LM disease (LIMA -> LAD, SVG -> OM, SVG -> RCA) - Continue ASA/statin  - Consider Plavix tomorrow after IABP pulled.   3. Acute hypoxic respiratory failure due to pulmonary edema - on vent - CCM following - Wean sedation in preparation for extubation hopefully relatively soon (would get IABP out first).   4. AKI  - due to ATN - Cr 1.0 -> 1.7 -> 1.95. - Support hemodynamics.   CRITICAL CARE Performed by: Loralie Champagne  Total critical care time: 40 minutes  Critical care time was exclusive of separately billable procedures and treating  other patients.  Critical care was necessary to treat or prevent imminent or life-threatening deterioration.  Critical care was time spent personally by me (independent of midlevel providers or residents) on the following activities: development of treatment plan with patient and/or surrogate as well as nursing, discussions with consultants, evaluation of patient's response to treatment, examination of patient, obtaining history from patient or surrogate, ordering and performing treatments and interventions, ordering and review of laboratory studies, ordering and review of radiographic studies, pulse oximetry and re-evaluation of patient's condition.   Length of Stay: 2   Loralie Champagne MD 09/25/2020, 9:40 AM  Advanced Heart Failure Team Pager [phone] (M-F; 7a - 4p)  Please contact [facility] for night-coverage after hours (4p -7a ) and weekends on [email or web address]

## 2020-09-25 NOTE — Progress Notes (Signed)
elink notified of abg

## 2020-09-25 NOTE — Significant Event (Addendum)
OVERNIGHT COVERAGE CRITICAL CARE PROGRESS NOTE  CTSP re: hypotension (MAP 30s) on norepinephrine/vasopressin/epinephrine and IABP.  Patient remains intubated and sedated after undergoing emergent 4-vv ACB.  By the time I arrived at the bedside, the nursing staff had already administered 0.3 mg epinephrine with resultant hypertensive response (SBP 200s).  Now titrating norepinephrine back down.  IABP on 1:1.  On exam, intubated, sedated.  Coarse breath sounds with bilateral rales and rhonchi.  Regular S1 and S2, tachycardic.  Abdomen is distended with tympanitic distention of ventral hernia.  Dr Orvan July note reviewed.  Case discussed with Dr. Denese Killings.  Aspiration event from earlier in the day noted.   Repeat ABG 7.18/52/307 on PRVC 24/510/100/10.  CXR shows no acute interval change to explain acute hypotension.   Trend lactate.  Trend CVP.  Agree with albumin/furosemide.  Already started on Zosyn  Vent adjusted at bedside: repeat ABG in 1 hour.  Continue DuoNeb.  Albuterol PRN.  Marcelle Smiling, MD Board Certified by the [employer], Pulmonary Diseases & Critical Care Medicine

## 2020-09-25 NOTE — Progress Notes (Signed)
RN auscultated bowel sounds and was not able to hear any. Second RN assessed and did not hear anything either. RN was able to hear hypoactive bowel sounds earlier in the shift. RN notified Ardeth Perfect, MD with CCM. No new order at this time. Pt NGT is set to Low-Intermittent Suction.

## 2020-09-25 NOTE — Progress Notes (Signed)
. Progress Note  Patient Name: Jack Vance Date of Encounter: 09/25/2020  Attending physician: Lajuana Matte, MD Primary care provider: [facility]   Subjective: Jack Vance is a 80 y.o. male who was seen and examined at bedside at ~ 330pm. Remains intubated.  New onset of afib.  Currently on inotrope and multiple pressor support.  IABP and Swan in place via RFA and RFV respectively.  Sister present at bedside.  Case discussed and reviewed with his nurse.  Objective: Vital Signs in the last 24 hours: Temp:  [94.82 F (34.9 C)-99.5 F (37.5 C)] 99.14 F (37.3 C) (05/08 1600) Pulse Rate:  [100-105] 100 (05/08 1530) Resp:  [8-37] 18 (05/08 1600) BP: (92-142)/(43-90) 120/47 (05/08 1600) SpO2:  [93 %-100 %] 93 % (05/08 1600) Arterial Line BP: (97-159)/(26-76) 97/34 (05/08 1600) FiO2 (%):  [40 %] 40 % (05/08 1457) Weight:  [87.1 kg] 87.1 kg (05/08 0530)  Intake/Output:  Intake/Output Summary (Last 24 hours) at 09/25/2020 1611 Last data filed at 09/25/2020 1536 Gross per 24 hour  Intake 4788.15 ml  Output 1130 ml  Net 3658.15 ml    Net IO Since Admission: 5,992.02 mL [09/25/20 1611]  Weights:  Filed Weights   09/23/20 0251 09/25/20 0530  Weight: 83 kg 87.1 kg    Telemetry: Personally reviewed - was NSR now Afib.    Physical examination: PHYSICAL EXAM: Vitals with BMI 09/25/2020 09/25/2020 09/25/2020  Height - - -  Weight - - -  BMI - - -  Systolic 597 416 384  Diastolic 47 43 90  Pulse - 100 -   CONSTITUTIONAL: critically ill, intubated, hemodynamically stable.   SKIN: Skin is cool and dry. No rash noted. No cyanosis. No pallor. No jaundice HEAD: Normocephalic and atraumatic.  EYES: No scleral icterus MOUTH/THROAT: ET tube in place.  NECK: JVD present. No thyromegaly noted. No carotid bruits  LYMPHATIC: No visible cervical adenopathy.  CHEST  sternotomy wires / dressing present, pacer pad present. No intercostal retractions  LUNGS: mechanical  breath sounds, no stridor. No wheezes. No rales.  CARDIOVASCULAR: irregularly irregular, variable S1, S2, tachycardia, no m/ r/ g appreciated due to tachycardia.  ABDOMINAL: Soft, NT, distended, BS present, ventral hernia present, no apparent ascites.  EXTREMITIES: +1 bilateral edema, compression stocking present, +2 DP left and +1DP right.  HEMATOLOGIC: No significant bruising NEUROLOGIC: Intubated and sedated.  PSYCHIATRIC: Intubated and sedated.   Lab Results: Hematology Recent Labs  Lab 09/24/20 0408 09/24/20 1516 09/24/20 2002 09/25/20 0355 09/25/20 0402  WBC 14.9* 16.0*  --  10.2  --   RBC 3.22* 3.05*  --  2.91*  --   HGB 8.5* 8.2* 7.8* 7.8* 7.1*  HCT 27.0* 25.6* 23.0* 24.7* 21.0*  MCV 83.9 83.9  --  84.9  --   MCH 26.4 26.9  --  26.8  --   MCHC 31.5 32.0  --  31.6  --   RDW 16.6* 16.8*  --  17.2*  --   PLT 133* 121*  --  119*  --     Chemistry Recent Labs  Lab 09/23/20 0311 09/23/20 1043 09/24/20 0408 09/24/20 1516 09/24/20 2002 09/25/20 0355 09/25/20 0402  NA 137   < > 134* 135 136 135 137  K 4.1   < > 4.4 3.8 3.3* 3.3* 3.3*  CL 100   < > 106 107  --  105  --   CO2 21*  --  21* 21*  --  21*  --  GLUCOSE 140*   < > 116* 141*  --  141*  --   BUN 36*   < > 37* 43*  --  49*  --   CREATININE 1.23   < > 1.71* 1.83*  --  1.95*  --   CALCIUM 9.1  --  7.7* 7.9*  --  7.5*  --   PROT 7.2  --   --   --   --   --   --   ALBUMIN 3.5  --   --   --   --   --   --   AST 131*  --   --   --   --   --   --   ALT 94*  --   --   --   --   --   --   ALKPHOS 77  --   --   --   --   --   --   BILITOT 0.9  --   --   --   --   --   --   GFRNONAA >60  --  40* 37*  --  35*  --   ANIONGAP 16*  --  7 7  --  9  --    < > = values in this interval not displayed.     Cardiac Enzymes: Cardiac Panel (last 3 results) Recent Labs    09/23/20 0311 09/23/20 0459 09/23/20 0554 09/23/20 0859  CKTOTAL  --   --  455*  --   CKMB  --   --  65.8*  --   TROPONINIHS 7,318* 7,464*  --   5,711*  RELINDX  --   --  14.5*  --     BNP (last 3 results) Recent Labs    09/23/20 0311  BNP 1,191.8*    ProBNP (last 3 results) No results for input(s): PROBNP in the last 8760 hours.   DDimer No results for input(s): DDIMER in the last 168 hours.   Hemoglobin A1c:  Lab Results  Component Value Date   HGBA1C 6.3 (H) 09/25/2020   MPG 134.11 09/25/2020    TSH No results for input(s): TSH in the last 8760 hours.  Lipid Panel     Component Value Date/Time   CHOL 65 09/25/2020 0355   TRIG 118 09/25/2020 0355   HDL 13 (L) 09/25/2020 0355   CHOLHDL 5.0 09/25/2020 0355   VLDL 24 09/25/2020 0355   LDLCALC 28 09/25/2020 0355    Imaging: DG Abd 1 View  Result Date: 09/25/2020 CLINICAL DATA:  Ileus. EXAM: ABDOMEN - 1 VIEW COMPARISON:  One-view chest x-ray 09/25/2020 and 09/24/2020. FINDINGS: Mildly dilated loops of bowel are present in the central abdomen. Bowel gas pattern is otherwise unremarkable. No definite free air is present on the supine images. Side port of the NG tube is in the stomach. Inferior Swan-Ganz catheter terminates distally in the right pulmonary artery, unchanged. Bilateral pleural effusions noted. IMPRESSION: 1. Mildly dilated loops of bowel in the central abdomen compatible with ileus. 2. Bilateral pleural effusions. Electronically Signed   By: San Morelle M.D.   On: 09/25/2020 09:01   DG CHEST PORT 1 VIEW  Result Date: 09/25/2020 CLINICAL DATA:  Follow-up.  Recent CABG surgery. EXAM: PORTABLE CHEST 1 VIEW COMPARISON:  09/24/2020 and earlier exams. FINDINGS: On stable changes from recent CABG surgery. No mediastinal widening. Patchy interstitial and hazy airspace lung opacities are noted, with more confluent opacity in the left medial  lung base, appearance similar to the exams from 05/07 and 09/23/2020. No pneumothorax. Swan-Ganz catheter tip projects in the peripheral right pulmonary artery. Right internal jugular central venous line, endotracheal  tube and nasal/orogastric tube are stable and well positioned. IMPRESSION: 1. No change in the bilateral interstitial and hazy airspace lung opacities favored to reflect pulmonary edema. No new lung abnormalities. 2. No pneumothorax. 3. Tip of the Swan-Ganz catheter appears more peripheral than on the prior exams. No other change in support apparatus. Electronically Signed   By: Lajean Manes M.D.   On: 09/25/2020 08:25   DG Chest Port 1 View  Result Date: 09/24/2020 CLINICAL DATA:  Status post three-vessel CABG EXAM: PORTABLE CHEST 1 VIEW COMPARISON:  Chest radiograph from one day prior. FINDINGS: Endotracheal tube tip is 2.5 cm above the carina. Enteric tube enters stomach with the tip not seen on this image. Right internal jugular central venous catheter terminates in the middle third of the SVC. Inferior approach Swan-Ganz catheter terminates over the right pulmonary artery. Intact sternotomy wires. Stable mediastinal drain and bibasilar chest tubes. Stable cardiomediastinal silhouette with mild cardiomegaly. No pneumothorax. No significant pleural effusions. Mild patchy hazy and reticular opacities throughout both lungs, similar. IMPRESSION: 1. No pneumothorax. Well-positioned support structures. 2. Stable mild cardiomegaly with mild patchy hazy and reticular opacities in both lungs, favor mild pulmonary edema superimposed on underlying nonspecific pulmonary fibrosis. Electronically Signed   By: Ilona Sorrel M.D.   On: 09/24/2020 08:32   DG Chest Port 1 View  Result Date: 09/23/2020 CLINICAL DATA:  Status post coronary bypass grafting EXAM: PORTABLE CHEST 1 VIEW COMPARISON:  Film from earlier in the same day. FINDINGS: Postsurgical changes are now seen. Endotracheal tube, gastric catheter, Swan-Ganz catheter and pericardial drain are seen mediastinal drain is noted as well. Intra-aortic balloon pump is noted in satisfactory position at the level of the aortic arch. Patchy airspace opacity is noted but  improved from the previous exam. No pneumothorax is seen. Right jugular catheter is noted as well. IMPRESSION: Patchy but improved airspace opacities bilaterally. Tubes and lines as described above. Electronically Signed   By: Inez Catalina M.D.   On: 09/23/2020 19:14    Cardiac database: CABG 09/23/2020: (CABG X 3, LIMA LAD, reverse SVG to distal right, reverse SVG to ramus) for critical left main disease.  EKG: 09/23/20 S. Tachy @ 101/min. ST elevation in aVR 2 mm and profound global ST depression 2 mm.  09/24/2020: Normal sinus rhythm, 66 bpm, second-degree type I AV block, diffuse ST-T changes.  Compared to prior EKG dated 09/24/2018 diffuse ST T changes remain stable but aVR elevation is now absent.  Echocardiogram: 09/23/2020:  1. Left ventricular ejection fraction, by estimation, is <20%. The left ventricle has severely decreased function. Severe global hypokinesis with akinesis of anterolateral wall. Grade II diastolic dysfunction (pseudonormalization). Elevated left atrial  pressure. 2. Right ventricular systolic function is moderately reduced. The right ventricular size is normal. There is moderately elevated pulmonary artery systolic pressure. 3. Left atrial size was mildly dilated. 4. The mitral valve is grossly normal. Mild to moderate mitral valve regurgitation. 5. Tricuspid valve regurgitation is moderate. Peak RA-RV gradient 41 mmHg.  Stress test: NA  Heart catheterization: 09/23/2020:  Ost RCA lesion is 80% stenosed.  Ost LM to Mid LM lesion is 90% stenosed.  1st Diag lesion is 60% stenosed.  Ost Cx to Prox Cx lesion is 90% stenosed.  LV end diastolic pressure is severely elevated.  Hemodynamic findings consistent with  moderate pulmonary hypertension.   Right and left heart catheterization 09/23/2020: RA 10/9, mean 9 mmHg. RV 50/7, EDP 11 mmHg PA 48/26, mean 36 mmHg.  PA saturation 69%. PW 26/34, mean 26 mmHg.  Aortic saturation 100%. Cardiac output 8.1,  cardiac index 4.21 by Fick.  Patient on the ventilator.  LV: 108/15, EDP 27 mmHg.  There was no pressure gradient across the aortic valve. LM: Irregular, ulcerated, 90% ostial stenosis and proximal stenosis.  Extends into the circumflex coronary artery. LAD: Very minimal disease.  D1 is large, proximal 50 to 60% stenosis. RI: Ostium has 50 to 60% stenosis. CX: Ulcerated ostial 90% stenosis. RCA: Ostium 80% stenosis.  Severe damping with engagement of the artery with no backflow.   Scheduled Meds: . sodium chloride   Intravenous Once  . acetaminophen  1,000 mg Oral Q6H   Or  . acetaminophen (TYLENOL) oral liquid 160 mg/5 mL  1,000 mg Per Tube Q6H  . acetaminophen (TYLENOL) oral liquid 160 mg/5 mL  650 mg Per Tube Once   Or  . acetaminophen  650 mg Rectal Once  . aspirin EC  325 mg Oral Daily   Or  . aspirin  324 mg Per Tube Daily  . atorvastatin  80 mg Oral Daily  . bisacodyl  10 mg Oral Daily   Or  . bisacodyl  10 mg Rectal Daily  . chlorhexidine gluconate (MEDLINE KIT)  15 mL Mouth Rinse BID  . Chlorhexidine Gluconate Cloth  6 each Topical Daily  . clonazePAM  0.5 mg Per Tube BID  . docusate sodium  200 mg Oral Daily  . feeding supplement (PROSource TF)  45 mL Per Tube BID  . feeding supplement (VITAL HIGH PROTEIN)  1,000 mL Per Tube Q24H  . furosemide  60 mg Intravenous BID  . gabapentin  300 mg Per Tube BID  . insulin aspart  0-24 Units Subcutaneous Q4H  . ipratropium-albuterol  3 mL Nebulization Q6H  . mouth rinse  15 mL Mouth Rinse 10 times per day  . multivitamin with minerals  1 tablet Per Tube Daily  . pantoprazole  40 mg Oral Daily  . QUEtiapine  25 mg Per Tube BID  . rOPINIRole  2 mg Per Tube QHS  . sodium chloride flush  10-40 mL Intracatheter Q12H  . sodium chloride flush  3 mL Intravenous Q12H  . thiamine  100 mg Per Tube Daily    Continuous Infusions: . sodium chloride Stopped (09/25/20 1304)  . sodium chloride    . sodium chloride 20 mL/hr at 09/25/20  1400  . amiodarone 60 mg/hr (09/25/20 1604)   Followed by  . amiodarone    .  ceFAZolin (ANCEF) IV 200 mL/hr at 09/25/20 1300  . dexmedetomidine (PRECEDEX) IV infusion 0.7 mcg/kg/hr (09/25/20 1400)  . epinephrine 1 mcg/min (09/25/20 1400)  . fentaNYL infusion INTRAVENOUS 200 mcg/hr (09/25/20 1400)  . insulin 1.9 Units/hr (09/25/20 0600)  . lactated ringers    . lactated ringers 20 mL/hr at 09/25/20 1400  . lactated ringers Stopped (09/24/20 1100)  . midazolam 2 mg/hr (09/25/20 1400)  . milrinone 0.25 mcg/kg/min (09/25/20 1400)  . nitroGLYCERIN    . norepinephrine (LEVOPHED) Adult infusion 65 mcg/min (09/25/20 1533)  . vasopressin 0.04 Units/min (09/25/20 1400)    PRN Meds: sodium chloride, dextrose, fentaNYL (SUBLIMAZE) injection, lactated ringers, metoprolol tartrate, midazolam, ondansetron (ZOFRAN) IV, sodium chloride flush, sodium chloride flush, traMADol   IMPRESSION & RECOMMENDATIONS: Jack Vance is a 80 y.o. male presents to  [place] [facility] as a non-STEMI found to be in cardiogenic shock transferred to [facility] for further evaluation.  Underwent left heart catheterization by Dr. 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  Patient underwent emergent coronary artery bypass grafting surgery with Dr. Kipp Brood and now is currently cardiac ICU for postop management.  Postop day 2 status post emergent three-vessel CABG (CABG X 3, LIMA LAD, reverse SVG to distal right, reverse SVG to ramus) for critical left main disease. -s/p CABG 09/23/2020  Post-op Afib:  Currently started on IV Amiodarone drip.   Cardiogenic shock: Critically ill.   Currently on inotrope support-milrinone Currently on pressor support : norepinephrine + vaso + epinephrine  Intra-aortic balloon in place. Bedside echo performed this morning: see EMR  Ventral hernia appears to be distended compared to yesterday abdominal x-ray concerning for ileus Status post  aspiration after tube feeds. Currently on IV Lasix but still demonstrates significantly reduced urine output. Now in atrial fibrillation started on amiodarone drip. Currently planned to transfuse 2 units of packed red blood cells Patient's sister is present at bedside.  Addressed her questions and concerns.  Notified that the patient is critically ill.  Acute kidney injury: Most-likely secondary to type I cardiorenal syndrome, cardiogenic shock, ischemic ATN. Monitor BUN / Cr   Acute biventricular heart failure: See above.   NSTEMI: Continue current management. Will uptitrate to GDMT once more stable.   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 Will uptitrate to GDMT once more stable.   Acute hypoxic respiratory failure due to pulmonary edema:  Vent management per CCM.    CRITICAL CARE Performed by: Rex Kras   Total critical care time: 35 minutes   Critical care time was exclusive of separately billable procedures and treating other patients.   Critical care was necessary to treat or prevent imminent or life-threatening deterioration.   Critical care was time spent personally by me on the following activities: development of treatment plan with patient and/or surrogate as well as nursing, discussions with consultants, evaluation of patient's response to treatment, examination of patient, obtaining history from patient or surrogate, ordering and performing treatments and interventions, ordering and review of laboratory studies, ordering and review of radiographic studies, pulse oximetry and re-evaluation of patient's condition.  This note was created using a voice recognition software as a result there may be grammatical errors inadvertently enclosed that do not reflect the nature of this encounter. Every attempt is made to correct such errors.  Rex Kras, DO, [facility]. [facility] Pager: [phone] Office: [phone]   09/25/2020, 4:11 PM

## 2020-09-25 NOTE — Progress Notes (Signed)
eLink Physician-Brief Progress Note Patient Name: Jack Vance DOB: 10/14/1940 MRN: [id number]   Date of Service  09/25/2020  HPI/Events of Note  ABG on 40%/SIMV 14/TV 510/PS 10/P 5 = 7.27/43.1/142/20.1  eICU Interventions  Plan: 1. Increase SIMV rate to 18. 2. Repeat ABG at 7:30 AM.     Intervention Category Major Interventions: Acid-Base disturbance - evaluation and management;Respiratory failure - evaluation and management  [name] 09/25/2020, 5:22 AM

## 2020-09-25 NOTE — Progress Notes (Signed)
T[facility] BRIEF SICU PROGRESS NOTE  2 Days Post-Op  S/P Procedure(s) (LRB): CORONARY ARTERY BYPASS GRAFTING (CABG)  TIMES 4 USING LEFT GREATER SAPHENOUS VEIN HARVESTED ENDOSCOPICALLY AND LEFT INTERNAL MAMMARY ARTERY (N/A) TRANSESOPHAGEAL ECHOCARDIOGRAM (TEE) (N/A)   Aspiration event this afternoon complicated by hypotension, shock, rapid Afib.  Currently back sinus tach on IV amiodarone.  BP responding to volume (PRBCs) but remains in shock on high dose levophed and vasopressin, IABP back 1:1 and milrinone 0.25 Epi 1 (unchanged).  O2 sats 100% and not having trouble ventilating. Tube feeds now on hold.  Abdomen more distended.  Oliguric ARF w/ marginal UOP all day despite 60 mg IV lasix this morning, creatinine up to 2.3 and bicarb down 18 and lactic acid 3.1  Plan: Start IV Zosyn for aspiration pneumonia.  Check f/u Hgb/Hct after transfusion.  Start lasix drip to stimulate UOP.  Wean levophed as tolerated for MAP 60.  Trend lactate and watch abdominal exam.  Rx w/ bicarb as needed.  Jack Nails, MD 09/25/2020 6:37 PM

## 2020-09-25 NOTE — Progress Notes (Signed)
Pharmacy Antibiotic Note  Jack Vance is a 80 y.o. male admitted on 09/23/2020 with mvCAD. Pharmacy has been consulted for Zosyn dosing with concerns for aspiration PNA.  Plan: Zosyn 3.375g IV EI q8h Watch Cr closely  Height: 5\' 6"  (167.6 cm) Weight: 87.1 kg (192 lb 0.3 oz) IBW/kg (Calculated) : 63.8  Temp (24hrs), Avg:97.5 F (36.4 C), Min:95.18 F (35.1 C), Max:99.5 F (37.5 C)  Recent Labs  Lab 09/23/20 0311 09/23/20 0646 09/23/20 1238 09/23/20 1634 09/23/20 1718 09/23/20 1839 09/24/20 0408 09/24/20 1103 09/24/20 1516 09/25/20 0355 09/25/20 0621  WBC 14.5*  --   --   --   --  18.9* 14.9*  --  16.0* 10.2  --   CREATININE 1.23  --    < > 1.00 1.00  --  1.71*  --  1.83* 1.95*  --   LATICACIDVEN 2.5* 2.4*  --   --   --   --   --  1.2  --   --  0.9   < > = values in this interval not displayed.    Estimated Creatinine Clearance: 32.3 mL/min (A) (by C-G formula based on SCr of 1.95 mg/dL (H)).    Allergies  Allergen Reactions  . Cheese     Causes asthma  . Morphine And Related Rash   11/25/20, PharmD, BCPS, [facility] Clinical Pharmacist [phone] Please check AMION for all [facility]acy numbers 09/25/2020

## 2020-09-25 NOTE — Progress Notes (Addendum)
Patient in afib with HR 100-102. Dr.'s Waymon Amato paged. Dr. Cornelius Moras ordered 2 units PRBC to be transfused and Dr. Shirlee Latch ordered amiodarone protocol started.

## 2020-09-25 NOTE — Progress Notes (Signed)
RN came to blood running that was initiated by previous Charity fundraiser. Blood Transfusion Ended at 1915.   Unit: [id number]

## 2020-09-26 ENCOUNTER — Inpatient Hospital Stay (HOSPITAL_COMMUNITY): Payer: No Typology Code available for payment source

## 2020-09-26 ENCOUNTER — Encounter (HOSPITAL_COMMUNITY): Payer: Self-pay | Admitting: Thoracic Surgery (Cardiothoracic Vascular Surgery)

## 2020-09-26 DIAGNOSIS — R570 Cardiogenic shock: Secondary | ICD-10-CM

## 2020-09-26 LAB — TYPE AND SCREEN
ABO/RH(D): A POS
Antibody Screen: NEGATIVE
Unit division: 0
Unit division: 0
Unit division: 0
Unit division: 0
Unit division: 0
Unit division: 0
Unit division: 0
Unit division: 0
Unit division: 0
Unit division: 0
Unit division: 0
Unit division: 0

## 2020-09-26 LAB — BPAM RBC
Blood Product Expiration Date: 202205122359
Blood Product Expiration Date: 202205122359
Blood Product Expiration Date: 202205272359
Blood Product Expiration Date: 202205272359
Blood Product Expiration Date: 202205272359
Blood Product Expiration Date: 202205272359
Blood Product Expiration Date: 202205272359
Blood Product Expiration Date: 202205272359
Blood Product Expiration Date: 202205272359
Blood Product Expiration Date: 202205272359
Blood Product Expiration Date: 202205282359
Blood Product Expiration Date: 202205282359
ISSUE DATE / TIME: 202205061351
ISSUE DATE / TIME: 202205061351
ISSUE DATE / TIME: 202205061457
ISSUE DATE / TIME: 202205081504
ISSUE DATE / TIME: 202205081504
ISSUE DATE / TIME: 202205090615
ISSUE DATE / TIME: 202205090631
ISSUE DATE / TIME: 202205090647
ISSUE DATE / TIME: 202205090932
ISSUE DATE / TIME: 202205091144
Unit Type and Rh: 6200
Unit Type and Rh: 6200
Unit Type and Rh: 6200
Unit Type and Rh: 6200
Unit Type and Rh: 6200
Unit Type and Rh: 6200
Unit Type and Rh: 6200
Unit Type and Rh: 6200
Unit Type and Rh: 6200
Unit Type and Rh: 6200
Unit Type and Rh: 6200
Unit Type and Rh: 6200

## 2020-09-26 LAB — COMPREHENSIVE METABOLIC PANEL
ALT: 9 U/L (ref 0–44)
AST: 54 U/L — ABNORMAL HIGH (ref 15–41)
Albumin: 1.9 g/dL — ABNORMAL LOW (ref 3.5–5.0)
Alkaline Phosphatase: 49 U/L (ref 38–126)
Anion gap: 12 (ref 5–15)
BUN: 56 mg/dL — ABNORMAL HIGH (ref 8–23)
CO2: 19 mmol/L — ABNORMAL LOW (ref 22–32)
Calcium: 7.1 mg/dL — ABNORMAL LOW (ref 8.9–10.3)
Chloride: 101 mmol/L (ref 98–111)
Creatinine, Ser: 2.3 mg/dL — ABNORMAL HIGH (ref 0.61–1.24)
GFR, Estimated: 28 mL/min — ABNORMAL LOW (ref >60)
Glucose, Bld: 201 mg/dL — ABNORMAL HIGH (ref 70–99)
Potassium: 3.8 mmol/L (ref 3.5–5.1)
Sodium: 132 mmol/L — ABNORMAL LOW (ref 135–145)
Total Bilirubin: 0.6 mg/dL (ref 0.3–1.2)
Total Protein: 4.5 g/dL — ABNORMAL LOW (ref 6.5–8.1)

## 2020-09-26 LAB — GLUCOSE, CAPILLARY
Glucose-Capillary: 106 mg/dL — ABNORMAL HIGH (ref 70–99)
Glucose-Capillary: 113 mg/dL — ABNORMAL HIGH (ref 70–99)
Glucose-Capillary: 152 mg/dL — ABNORMAL HIGH (ref 70–99)
Glucose-Capillary: 172 mg/dL — ABNORMAL HIGH (ref 70–99)
Glucose-Capillary: 180 mg/dL — ABNORMAL HIGH (ref 70–99)
Glucose-Capillary: 96 mg/dL (ref 70–99)

## 2020-09-26 LAB — CBC
HCT: 30.8 % — ABNORMAL LOW (ref 39.0–52.0)
Hemoglobin: 10.2 g/dL — ABNORMAL LOW (ref 13.0–17.0)
MCH: 27.3 pg (ref 26.0–34.0)
MCHC: 33.1 g/dL (ref 30.0–36.0)
MCV: 82.6 fL (ref 80.0–100.0)
Platelets: 61 10*3/uL — ABNORMAL LOW (ref 150–400)
RBC: 3.73 MIL/uL — ABNORMAL LOW (ref 4.22–5.81)
RDW: 16.5 % — ABNORMAL HIGH (ref 11.5–15.5)
WBC: 5.4 10*3/uL (ref 4.0–10.5)
nRBC: 1.5 % — ABNORMAL HIGH (ref 0.0–0.2)

## 2020-09-26 LAB — POCT I-STAT 7, (LYTES, BLD GAS, ICA,H+H)
Acid-base deficit: 4 mmol/L — ABNORMAL HIGH (ref 0.0–2.0)
Acid-base deficit: 4 mmol/L — ABNORMAL HIGH (ref 0.0–2.0)
Bicarbonate: 20.6 mmol/L (ref 20.0–28.0)
Bicarbonate: 19.9 mmol/L — ABNORMAL LOW (ref 20.0–28.0)
Calcium, Ion: 1.02 mmol/L — ABNORMAL LOW (ref 1.15–1.40)
Calcium, Ion: 1.02 mmol/L — ABNORMAL LOW (ref 1.15–1.40)
HCT: 29 % — ABNORMAL LOW (ref 39.0–52.0)
HCT: 31 % — ABNORMAL LOW (ref 39.0–52.0)
Hemoglobin: 9.9 g/dL — ABNORMAL LOW (ref 13.0–17.0)
Hemoglobin: 10.5 g/dL — ABNORMAL LOW (ref 13.0–17.0)
O2 Saturation: 100 %
O2 Saturation: 99 %
Patient temperature: 98.4
Patient temperature: 36.2
Potassium: 3.8 mmol/L (ref 3.5–5.1)
Potassium: 3.8 mmol/L (ref 3.5–5.1)
Sodium: 134 mmol/L — ABNORMAL LOW (ref 135–145)
Sodium: 134 mmol/L — ABNORMAL LOW (ref 135–145)
TCO2: 22 mmol/L (ref 22–32)
TCO2: 21 mmol/L — ABNORMAL LOW (ref 22–32)
pCO2 arterial: 33.6 mmHg (ref 32.0–48.0)
pCO2 arterial: 31.1 mmHg — ABNORMAL LOW (ref 32.0–48.0)
pH, Arterial: 7.395 (ref 7.350–7.450)
pH, Arterial: 7.41 (ref 7.350–7.450)
pO2, Arterial: 325 mmHg — ABNORMAL HIGH (ref 83.0–108.0)
pO2, Arterial: 136 mmHg — ABNORMAL HIGH (ref 83.0–108.0)

## 2020-09-26 LAB — LACTIC ACID, PLASMA
Lactic Acid, Venous: 3.5 mmol/L (ref 0.5–1.9)
Lactic Acid, Venous: 2.8 mmol/L (ref 0.5–1.9)
Lactic Acid, Venous: 2.4 mmol/L (ref 0.5–1.9)

## 2020-09-26 LAB — COOXEMETRY PANEL
Carboxyhemoglobin: 1.2 % (ref 0.5–1.5)
Methemoglobin: 1.3 % (ref 0.0–1.5)
O2 Saturation: 72.3 %
Total hemoglobin: 10.3 g/dL — ABNORMAL LOW (ref 12.0–16.0)

## 2020-09-26 LAB — MAGNESIUM: Magnesium: 2.5 mg/dL — ABNORMAL HIGH (ref 1.7–2.4)

## 2020-09-26 MED ORDER — POTASSIUM CHLORIDE 10 MEQ/50ML IV SOLN
10.0000 meq | INTRAVENOUS | Status: AC
  Administered 2020-09-26 (×4): 10 meq via INTRAVENOUS
  Filled 2020-09-26 (×4): qty 50

## 2020-09-26 MED ORDER — VITAL HIGH PROTEIN PO LIQD: 1000.0000 mL | ORAL | Status: DC

## 2020-09-26 MED ORDER — INSULIN DETEMIR 100 UNIT/ML ~~LOC~~ SOLN
8.0000 [IU] | Freq: Two times a day (BID) | SUBCUTANEOUS | Status: DC
  Administered 2020-09-26 – 2020-09-27 (×2): 8 [IU] via SUBCUTANEOUS
  Filled 2020-09-26 (×6): qty 0.08

## 2020-09-26 MED ORDER — METOCLOPRAMIDE HCL 5 MG/ML IJ SOLN
10.0000 mg | Freq: Four times a day (QID) | INTRAMUSCULAR | Status: DC
  Administered 2020-09-26: 10 mg via INTRAVENOUS
  Filled 2020-09-26: qty 2

## 2020-09-26 MED ORDER — METOCLOPRAMIDE HCL 5 MG/ML IJ SOLN
5.0000 mg | Freq: Four times a day (QID) | INTRAMUSCULAR | Status: AC
  Administered 2020-09-26 – 2020-09-28 (×10): 5 mg via INTRAVENOUS
  Filled 2020-09-26 (×9): qty 2

## 2020-09-26 MED ORDER — PROSOURCE TF PO LIQD
45.0000 mL | Freq: Two times a day (BID) | ORAL | Status: DC
  Administered 2020-09-26: 45 mL
  Filled 2020-09-26: qty 45

## 2020-09-26 MED ORDER — PIVOT 1.5 CAL PO LIQD
1000.0000 mL | ORAL | Status: DC
  Administered 2020-09-26: 1000 mL

## 2020-09-26 MED FILL — Verapamil HCl IV Soln 2.5 MG/ML: INTRAVENOUS | Qty: 2 | Status: AC

## 2020-09-26 MED FILL — Heparin Sod (Porcine)-NaCl IV Soln 1000 Unit/500ML-0.9%: INTRAVENOUS | Qty: 1000 | Status: AC

## 2020-09-26 NOTE — Procedures (Signed)
Cortrak  Person Inserting Tube:  Kendell Bane C, RD Tube Type:  Cortrak - 55 inches Tube Location:  Left nare Initial Placement:  Stomach Secured by: Bridle Technique Used to Measure Tube Placement:  Documented cm marking at nare/ corner of mouth    Cortrak Tube Team Note:  Consult received to place a Cortrak feeding tube.   X-ray is required, abdominal x-ray has been ordered by the Cortrak team. Please confirm tube placement before using the Cortrak tube.   If the tube becomes dislodged please keep the tube and contact the Cortrak team at [email or web address] (password [email or web address]) for replacement.  If after hours and replacement cannot be delayed, place a NG tube and confirm placement with an abdominal x-ray.    Cammy Copa., RD, LDN, CNSC See AMiON for contact information

## 2020-09-26 NOTE — Progress Notes (Addendum)
[address]       [place] [address]             [phone]                 3 Days Post-Op Procedure(s) (LRB): CORONARY ARTERY BYPASS GRAFTING (CABG)  TIMES 4 USING LEFT GREATER SAPHENOUS VEIN HARVESTED ENDOSCOPICALLY AND LEFT INTERNAL MAMMARY ARTERY (N/A) TRANSESOPHAGEAL ECHOCARDIOGRAM (TEE) (N/A)   Events: Distended this am Hemodynamically stable after IABP removal _______________________________________________________________ Vitals: BP (!) 101/53   Pulse 82   Temp 98.96 F (37.2 C)   Resp (!) 22   Ht 5\' 6"  (1.676 m)   Wt 89.9 kg   SpO2 100%   BMI 31.99 kg/m   - Neuro: Sedated.  Precedex at 0.7, fentanyl at 100, Versed 1  - Cardiovascular: Normal sinus rhythm.  I  Drips:  milrinone at 0.25, Levophed at 7 Lasix at 10 PAP: (29-63)/(15-39) 34/20 CVP:  [4 mmHg-88 mmHg] 9 mmHg CO:  [4.4 L/min-8.9 L/min] 5.3 L/min CI:  [2.3 L/min/m2-4.6 L/min/m2] 2.8 L/min/m2  - Pulm:  Vent Mode: PRVC FiO2 (%):  [40 %-100 %] 40 % Set Rate:  [18 bmp-28 bmp] 22 bmp Vt Set:  [510 mL-560 mL] 560 mL PEEP:  [5 cmH20] 5 cmH20 Pressure Support:  [10 cmH20] 10 cmH20 Plateau Pressure:  [21 cmH20-32 cmH20] 23 cmH20  ABG    Component Value Date/Time   PHART 7.410 09/26/2020 0539   PCO2ART 31.1 (L) 09/26/2020 0539   PO2ART 136 (H) 09/26/2020 0539   HCO3 19.9 (L) 09/26/2020 0539   TCO2 21 (L) 09/26/2020 0539   ACIDBASEDEF 4.0 (H) 09/26/2020 0539   O2SAT 99.0 09/26/2020 0539    - Abd: Distended, ventral hernia  - Extremity: Warm  .Intake/Output      05/08 0701 05/09 0700 05/09 0701 05/10 0700   I.V. (mL/kg) 4738.1 (52.7) 466.8 (5.2)   Blood 630    NG/GT 120    IV Piggyback 494.9 97.9   Total Intake(mL/kg) 5983 (66.6) 564.7 (6.3)   Urine (mL/kg/hr) 1875 (0.9) 380 (1)   Emesis/NG output 1250    Chest Tube 60    Total Output 3185 380   Net +2798 +184.7           _______________________________________________________________ Labs: CBC Latest Ref  Rng & Units 09/26/2020 09/26/2020 09/26/2020  WBC 4.0 - 10.5 K/uL - 5.4 -  Hemoglobin 13.0 - 17.0 g/dL 10.5(L) 10.2(L) 9.9(L)  Hematocrit 39.0 - 52.0 % 31.0(L) 30.8(L) 29.0(L)  Platelets 150 - 400 K/uL - 61(L) -   CMP Latest Ref Rng & Units 09/26/2020 09/26/2020 09/26/2020  Glucose 70 - 99 mg/dL - 11/26/2020) -  BUN 8 - 23 mg/dL - 440(H) -  Creatinine 47(Q - 1.24 mg/dL - 2.59) -  Sodium 5.63(O - 145 mmol/L 134(L) 132(L) 134(L)  Potassium 3.5 - 5.1 mmol/L 3.8 3.8 3.8  Chloride 98 - 111 mmol/L - 101 -  CO2 22 - 32 mmol/L - 19(L) -  Calcium 8.9 - 10.3 mg/dL - 7.1(L) -  Total Protein 6.5 - 8.1 g/dL - 4.5(L) -  Total Bilirubin 0.3 - 1.2 mg/dL - 0.6 -  Alkaline Phos 38 - 126 U/L - 49 -  AST 15 - 41 U/L - 54(H) -  ALT 0 - 44 U/L - 9 -    CXR: Pulmonary vascular congestion with pleural effusions.  _______________________________________________________________  Assessment and Plan: POD 4 s/p emergency CABG.  Neuro: Wean sedation as tolerated.  CV: weaning support as tolerated. Pulm: Wean vent as tolerated.  Patient still appears volume overloaded.  Good respiratory mechanics. Renal: Creatinine continues to rise.  Tolerating diuresis GI: Tube feeds on hold. Heme: Stable ID: On Zosyn. Endo: Sliding scale insulin. Dispo: Continue ICU care.   Corliss Skains 09/26/2020 11:14 AM

## 2020-09-26 NOTE — Progress Notes (Signed)
Sedation Wake Up Assessment   Start: 0500  Medications Precedex 0.56mcg/kg/hr Fentanyl 178mcg/hr Versed 2mg /hr  Medications were turned off for - Pt will grimace to mouth care, but no facial grimace to painful stimuli - Unable to open eyes to verbal or painful stimuli  - Unable to follow any verbal commands - Pt remained synchronous on the ventilator - Positive Corneal Reflex - No gag/cough during ETT  Suctioning - Pupils reactive 57mm Sluggish   Stopped: 0600   Medications Precedex 0.35mcg/kg/hr Fentanyl 170mcg/hr Versed 1mg /hr

## 2020-09-26 NOTE — Progress Notes (Addendum)
Advanced Heart Failure Rounding Note   Subjective:    Underwent emergent CABG on 5/6 for critical LM disease (LIMA -> LAD, SVG -> OM, SVG -> RCA)  5/8 Possible aspiration. A fib --> started amio drip.  Given 2u PRBCs. Hypotensive in the evening and started on vasopressin + antibiotics.   Sedated on vent. FiO2 40%   Currently on milrinone 0.25, Norepi 4 mcg, vasopressin 0.04. Epi 1  IABP currently 1:2   Diuresing with lasix drip.  Weight up 6 pounds. CVP 11.   Creatinine 1.83 => 1.95=>2.3 .  Lactic acid 2.8.  Swan numbers: CVP 11 PA 29/15  CO 5.3  CI 2.8  PAPi 1.3  Objective:   Weight Range:  Vital Signs:   Temp:  [96.4 F (35.8 C)-99.5 F (37.5 C)] 99 F (37.2 C) (05/09 0822) Pulse Rate:  [80-127] 82 (05/09 0735) Resp:  [12-28] 22 (05/09 0822) BP: (85-206)/(38-107) 133/44 (05/09 0735) SpO2:  [67 %-100 %] 100 % (05/09 1610) Arterial Line BP: (78-231)/(24-154) 119/35 (05/09 0822) FiO2 (%):  [40 %-100 %] 40 % (05/09 0735) Weight:  [89.9 kg] 89.9 kg (05/09 0319)    Weight change: Filed Weights   09/23/20 0251 09/25/20 0530 09/26/20 0319  Weight: 83 kg 87.1 kg 89.9 kg    Intake/Output:   Intake/Output Summary (Last 24 hours) at 09/26/2020 0850 Last data filed at 09/26/2020 0800 Gross per 24 hour  Intake 5989.11 ml  Output 3225 ml  Net 2764.11 ml    CVP 11-12  General: Intubated  HEENT: ETT  Neck: supple. JVP 11-12 no JVD. Carotids 2+ bilat; no bruits. No lymphadenopathy or thryomegaly appreciated. Cor: PMI nondisplaced. Regular rate & rhythm. No rubs, gallops or murmurs. Zoll Pad on  Lungs: Coarse throughtout Abdomen: soft, nontender,hernia. No hepatosplenomegaly. No bruits or masses. Good bowel sounds. Extremities: no cyanosis, clubbing, rash, R and LLE 1+ edema. R femoral IABP/Swan Neuro: intubated /sedated.   Telemetry: SR 80s  Labs: Basic Metabolic Panel: Recent Labs  Lab 09/24/20 0408 09/24/20 1516 09/24/20 2002 09/25/20 0355 09/25/20 0402  09/25/20 1609 09/25/20 1739 09/25/20 2009 09/25/20 2230 09/26/20 0248 09/26/20 0413 09/26/20 0539  NA 134* 135   < > 135   < > 132*   < > 134* 134* 134* 132* 134*  K 4.4 3.8   < > 3.3*   < > 3.9   < > 3.9 3.9 3.8 3.8 3.8  CL 106 107  --  105  --  103  --   --   --   --  101  --   CO2 21* 21*  --  21*  --  18*  --   --   --   --  19*  --   GLUCOSE 116* 141*  --  141*  --  217*  --   --   --   --  201*  --   BUN 37* 43*  --  49*  --  55*  --   --   --   --  56*  --   CREATININE 1.71* 1.83*  --  1.95*  --  2.34*  --   --   --   --  2.30*  --   CALCIUM 7.7* 7.9*  --  7.5*  --  7.2*  --   --   --   --  7.1*  --   MG 3.6* 3.3*  --  3.1*  --  2.7*  --   --   --   --  2.5*  --   PHOS  --  5.1*  --  4.4  --  3.5  --   --   --   --   --   --    < > = values in this interval not displayed.    Liver Function Tests: Recent Labs  Lab 09/23/20 0311 09/26/20 0413  AST 131* 54*  ALT 94* 9  ALKPHOS 77 49  BILITOT 0.9 0.6  PROT 7.2 4.5*  ALBUMIN 3.5 1.9*   No results for input(s): LIPASE, AMYLASE in the last 168 hours. No results for input(s): AMMONIA in the last 168 hours.  CBC: Recent Labs  Lab 09/23/20 0311 09/23/20 0859 09/24/20 0408 09/24/20 1516 09/24/20 2002 09/25/20 0355 09/25/20 0402 09/25/20 2009 09/25/20 2230 09/25/20 2329 09/26/20 0248 09/26/20 0413 09/26/20 0539  WBC 14.5*   < > 14.9* 16.0*  --  10.2  --  7.6  --   --   --  5.4  --   NEUTROABS 12.5*  --   --   --   --   --   --   --   --   --   --   --   --   HGB 7.5*   < > 8.5* 8.2*   < > 7.8*   < > 10.8*  11.6* 9.9* 10.3* 9.9* 10.2* 10.5*  HCT 24.7*   < > 27.0* 25.6*   < > 24.7*   < > 34.0*  34.0* 29.0* 31.6* 29.0* 30.8* 31.0*  MCV 80.7   < > 83.9 83.9  --  84.9  --  84.8  --   --   --  82.6  --   PLT 269   < > 133* 121*  --  119*  --  83*  --   --   --  61*  --    < > = values in this interval not displayed.    Cardiac Enzymes: Recent Labs  Lab 09/23/20 0554  CKTOTAL 455*  CKMB 65.8*    BNP: BNP  (last 3 results) Recent Labs    09/23/20 0311  BNP 1,191.8*    ProBNP (last 3 results) No results for input(s): PROBNP in the last 8760 hours.    Other results:  Imaging: DG Abd 1 View  Result Date: 09/26/2020 CLINICAL DATA:  Aspiration, ileus, post CABG EXAM: ABDOMEN - 1 VIEW COMPARISON:  Portable exam 0523 hours compared to 09/25/2020 FINDINGS: Tip of nasogastric tube projects over distal gastric antrum. Few air-filled loops of nondistended bowel in RIGHT upper quadrant. Paucity of bowel gas throughout remainder of abdomen. No urinary tract calcification or acute osseous findings. IMPRESSION: Nonspecific bowel gas pattern. Electronically Signed   By: Lavonia Dana M.D.   On: 09/26/2020 08:11   DG Abd 1 View  Result Date: 09/25/2020 CLINICAL DATA:  Ileus. EXAM: ABDOMEN - 1 VIEW COMPARISON:  One-view chest x-ray 09/25/2020 and 09/24/2020. FINDINGS: Mildly dilated loops of bowel are present in the central abdomen. Bowel gas pattern is otherwise unremarkable. No definite free air is present on the supine images. Side port of the NG tube is in the stomach. Inferior Swan-Ganz catheter terminates distally in the right pulmonary artery, unchanged. Bilateral pleural effusions noted. IMPRESSION: 1. Mildly dilated loops of bowel in the central abdomen compatible with ileus. 2. Bilateral pleural effusions. Electronically Signed   By: San Morelle M.D.   On: 09/25/2020 09:01   DG Chest Port 1 View  Result Date: 09/26/2020 CLINICAL  DATA:  Post CABG, aspiration, ileus, intubation EXAM: PORTABLE CHEST 1 VIEW COMPARISON:  Portable exam 0517 hours compared to 09/25/2020 FINDINGS: Tip of endotracheal tube projects 3.1 cm above carina. Nasogastric tube extends into stomach. Swan-Ganz catheter tip projects over RIGHT hilum. RIGHT jugular central venous catheter tip projects over SVC. External pacing leads present. Enlargement of cardiac silhouette with pulmonary vascular congestion. Mild pulmonary  infiltrates favoring pulmonary edema. Bibasilar atelectasis and probable small RIGHT pleural effusion. No pneumothorax. IMPRESSION: Bibasilar atelectasis and probable small RIGHT pleural effusion. Enlargement of cardiac silhouette with vascular congestion and minimal pulmonary edema. Electronically Signed   By: Lavonia Dana M.D.   On: 09/26/2020 08:10   DG CHEST PORT 1 VIEW  Result Date: 09/25/2020 CLINICAL DATA:  Possible aspiration. EXAM: PORTABLE CHEST 1 VIEW COMPARISON:  Sep 25, 2020 (6:32 a.m.) FINDINGS: There is stable endotracheal tube, nasogastric tube and right internal jugular venous catheter positioning. Stable Swan-Ganz catheter positioning is also noted. Multiple sternal wires are seen. Chronic appearing increased lung markings are seen. There is no evidence of a pleural effusion or pneumothorax. The cardiac silhouette is mildly enlarged. A radiopaque fixation plate and screws are seen within the proximal right humerus. IMPRESSION: No significant interval change when compared to the prior study, dated Sep 25, 2020 (6:32 a.m.). Electronically Signed   By: Virgina Norfolk M.D.   On: 09/25/2020 19:53   DG CHEST PORT 1 VIEW  Result Date: 09/25/2020 CLINICAL DATA:  Follow-up.  Recent CABG surgery. EXAM: PORTABLE CHEST 1 VIEW COMPARISON:  09/24/2020 and earlier exams. FINDINGS: On stable changes from recent CABG surgery. No mediastinal widening. Patchy interstitial and hazy airspace lung opacities are noted, with more confluent opacity in the left medial lung base, appearance similar to the exams from 05/07 and 09/23/2020. No pneumothorax. Swan-Ganz catheter tip projects in the peripheral right pulmonary artery. Right internal jugular central venous line, endotracheal tube and nasal/orogastric tube are stable and well positioned. IMPRESSION: 1. No change in the bilateral interstitial and hazy airspace lung opacities favored to reflect pulmonary edema. No new lung abnormalities. 2. No pneumothorax. 3. Tip  of the Swan-Ganz catheter appears more peripheral than on the prior exams. No other change in support apparatus. Electronically Signed   By: Lajean Manes M.D.   On: 09/25/2020 08:25     Medications:     Scheduled Medications: . sodium chloride   Intravenous Once  . acetaminophen (TYLENOL) oral liquid 160 mg/5 mL  650 mg Per Tube Once   Or  . acetaminophen  650 mg Rectal Once  . aspirin EC  325 mg Oral Daily   Or  . aspirin  324 mg Per Tube Daily  . bisacodyl  10 mg Rectal Daily  . chlorhexidine gluconate (MEDLINE KIT)  15 mL Mouth Rinse BID  . Chlorhexidine Gluconate Cloth  6 each Topical Daily  . insulin aspart  0-24 Units Subcutaneous Q4H  . ipratropium-albuterol  3 mL Nebulization Q6H  . mouth rinse  15 mL Mouth Rinse 10 times per day  . metoCLOPramide (REGLAN) injection  10 mg Intravenous Q6H  . pantoprazole (PROTONIX) IV  40 mg Intravenous Q24H  . sodium chloride flush  10-40 mL Intracatheter Q12H  . sodium chloride flush  3 mL Intravenous Q12H    Infusions: . sodium chloride Stopped (09/26/20 0606)  . sodium chloride    . sodium chloride 20 mL/hr at 09/26/20 0800  . amiodarone 30 mg/hr (09/26/20 0800)  . dexmedetomidine (PRECEDEX) IV infusion 0.7 mcg/kg/hr (09/26/20  0800)  . epinephrine 1 mcg/min (09/26/20 0800)  . fentaNYL infusion INTRAVENOUS 100 mcg/hr (09/26/20 0800)  . furosemide (LASIX) 200 mg in dextrose 5% 100 mL (63m/mL) infusion 10 mg/hr (09/26/20 0800)  . insulin 1.9 Units/hr (09/25/20 0600)  . lactated ringers 20 mL/hr at 09/26/20 0300  . lactated ringers 20 mL/hr at 09/26/20 0800  . midazolam 1 mg/hr (09/26/20 0800)  . milrinone 0.25 mcg/kg/min (09/26/20 0800)  . norepinephrine (LEVOPHED) Adult infusion 4 mcg/min (09/26/20 0800)  . piperacillin-tazobactam (ZOSYN)  IV 12.5 mL/hr at 09/26/20 0800  . vasopressin 0.04 Units/min (09/26/20 0800)    PRN Medications: sodium chloride, albuterol, dextrose, fentaNYL (SUBLIMAZE) injection, metoprolol tartrate,  midazolam, ondansetron (ZOFRAN) IV, sodium chloride flush, sodium chloride flush   Assessment/Plan:   1. 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  -  milrinone 0.25 + norepi 4 + vasopressin 0.04 units. Hemodynamics stable.  - Lactic acid trending down 2.8  - Weaning IABP 1:2. -CVP 11-12. Continue lasix drip.   2. NSTEMI with critical CAD - 95% LM and high grade ostial RCA - CABG on 5/6 for critical LM disease (LIMA -> LAD, SVG -> OM, SVG -> RCA) - Continue ASA/statin  - Consider Plavix after IABP pulled.   3. Acute hypoxic respiratory failure due to pulmonary edema - on vent - Possible aspiration 5/8 . Started on antibiotics + vasopressin - CCM following  4. AKI  - due to ATN - Cr 1.0 -> 1.7 -> 1.95-->2.3  - Support hemodynamics.    Length of Stay: 3MelroseNP-C  09/26/2020, 8:50 AM  Advanced Heart Failure Team Pager [phone](M-F; 7a - 4p)  Please contact [facility] for night-coverage after hours (4p -7a ) and weekends on [email or web address]  Agree with above.   He remains very tenuous post-op day #3. Remains intubate. On multiple pressors and IABP. Swan numbers with adequate CO but PAPi low. Has been very agitated. Creatinine slightly worse. Weight still up 14 pounds.   General:  Intubated/sedated HEENT: normal + ETT Neck: supple. RIJ swan  Carotids 2+ bilat; no bruits. No lymphadenopathy or thryomegaly appreciated. Cor: PMI nondisplaced. Sternal scar. + CTs Regular rate & rhythm. No rubs, gallops or murmurs. Lungs: coarse Abdomen: + ventral hernia  No hepatosplenomegaly. No bruits or masses. Good bowel sounds. Extremities: no cyanosis, clubbing, rash, 1-2+ edema  RFA IABP Neuro: Intubated/sedated. Agitated when awake  Will wean IABP this morning due to agitation and fear of dislodgement  once more awake. Continue inotropes. Continue diuresis. Hopefully can extubate soon. Watch renal function closely.    D/w TCTS and CCM at bedside.   CRITICAL CARE Performed by: BGlori Bickers Total critical care time: 45 minutes  Critical care time was exclusive of separately billable procedures and treating other patients.  Critical care was necessary to treat or prevent imminent or life-threatening deterioration.  Critical care was time spent personally by me (independent of midlevel providers or residents) on the following activities: development of treatment plan with patient and/or surrogate as well as nursing, discussions with consultants, evaluation of patient's response to treatment, examination of patient, obtaining history from patient or surrogate, ordering and performing treatments and interventions, ordering and review of laboratory studies, ordering and review of radiographic studies, pulse oximetry and re-evaluation of patient's condition.  DGlori Bickers MD  10:57 AM

## 2020-09-26 NOTE — Progress Notes (Signed)
Removed IABP from right groin arterial site.  Pressure held x 25 minutes.  Site looks good with no hematoma.  Site dressed with 4x4 and tape.  Distal pulses 2+ right DP.  Vitals stable throughout hold.  RN will continue to watch site.  Pt sedated so RN will keep bedrest x 6 hours.

## 2020-09-26 NOTE — Progress Notes (Signed)
NAME:  Jack Vance, MRN:  [id number], DOB:  [birth year], LOS: 3 ADMISSION DATE:  09/23/2020, CONSULTATION DATE:  09/24/2019 REFERRING MD:  Jacinto Halim, CHIEF COMPLAINT:  Acute coronary syndrome   History of Present Illness:  80 year old man who presented with cardiogenic shock from NSTEMI  Presented with new onset RSCP and severe dyspnea. Found to be in acute pulmonary edema and he was intubated and sent for urgent LHC which revealed 3V CAD.  IABP inserted and patient underwent urgent CABG.   Pertinent  Medical History   Past Medical History:  Diagnosis Date  . Arthritis   . Asthma   . DDD (degenerative disc disease), lumbar   . Hernia of abdominal wall   . RLS (restless legs syndrome)      Significant Hospital Events: Including procedures, antibiotic start and stop dates in addition to other pertinent events   . 5/6 CABG with Dr Cliffton Asters with LIMA to LAD, SVG to RCA d and SVG to ramus. LV normal and returned to ICU with no vasopressor support.  . No post-operative bleeding but poor RV performance on hemodynamics.  . 5/8 significant hemodynamic instability with labile blood pressure and vasopressor requirements yesterday.  Episode of emesis but no evidence of bowel obstruction.  Evidence of pericardial tamponade. . 5/9 improved hemodynamic, able now to tolerate balloon pump weaning and removal.  Interim History / Subjective:  Calm today.  On weaning vasopressor requirements.  Tolerated 3-1 balloon pump weaning prior to removal.  Core track tube placed in proximal small bowel.  Objective   Blood pressure (!) 117/59, pulse 85, temperature 99.32 F (37.4 C), resp. rate (!) 22, height 5\' 6"  (1.676 m), weight 89.9 kg, SpO2 100 %. PAP: (29-63)/(15-39) 37/21 CVP:  [4 mmHg-88 mmHg] 7 mmHg CO:  [4.4 L/min-8.9 L/min] 5 L/min CI:  [2.3 L/min/m2-4.6 L/min/m2] 2.5 L/min/m2  Vent Mode: PRVC FiO2 (%):  [40 %-100 %] 40 % Set Rate:  [22 bmp-28 bmp] 22 bmp Vt Set:  [560 mL] 560 mL PEEP:  [5  cmH20] 5 cmH20 Plateau Pressure:  [22 cmH20-32 cmH20] 22 cmH20   Intake/Output Summary (Last 24 hours) at 09/26/2020 1508 Last data filed at 09/26/2020 1200 Gross per 24 hour  Intake 5263.64 ml  Output 3420 ml  Net 1843.64 ml   Filed Weights   09/23/20 0251 09/25/20 0530 09/26/20 0319  Weight: 83 kg 87.1 kg 89.9 kg   Pulmonary artery pulsatility index: 1.4  Examination: General: obese, chronically ill man intubated and sedated.  HENT: OGT.ETT in place.  Lungs: crackles at both bases,   Cardiovascular: Extremities cool, + rub + IABP, midline incision intact, minimal chest tube drainage.  Abdomen: protuberant, soft.  Midline scar..  Bowel sounds present Extremities: no edema.  Neuro: sedated with minimal response to painful stimulation..  GU: foley in place with clear urine.   Labs/imaging that I havepersonally reviewed  (right click and "Reselect all SmartList Selections" daily)  ABG shows normal pH.  Acceptable oxygenation. Improving hyponatremia.  Creatinine has risen to 2.3. Minimal lactic acid at 2.4 Normal white count, stable hemoglobin 10.5. Thrombocytopenia at 61.  Resolved Hospital Problem list   NSTEMI  Assessment & Plan:  Critically ill due to acute hypoxic and hypercapnic respiratory failure requiring mechanical ventilation Critically ill due to RV dysfunction following ACS and CPB NSTEMI with culprit RCA involvement ( RV involvement?) Thrombocytopenia Agitated delirium Alcohol abuse. CAD  Plan:  -Continue to wean vasopressin and norepinephrine -Remove balloon pump, then wean sedation when  able to sit up and proceed to SBT for potential extubation. -Commence trickle feed.  Continue metoclopramide as prokinetic agent. -Continue milrinone and epinephrine for RV support post extubation.  Best practice (right click and "Reselect all SmartList Selections" daily)  Diet:  Tube Feed  Trophic feeds on Pain/Anxiety/Delirium protocol (if indicated): Yes (RASS goal  -3) wean sedation once able to sit up post balloon pump removal VAP protocol (if indicated): Yes DVT prophylaxis: Contraindicated due to thrombocytopenia.  If continues to drop will need HIT assay tomorrow GI prophylaxis: PPI Glucose control:  SSI Yes Central venous access:  Yes, and it is still needed Arterial line:  Yes, and it is still needed Foley:  Yes, and it is still needed Mobility:  bed rest  PT consulted: N/A Last date of multidisciplinary goals of care discussion [will update. ] Code Status:  full code Disposition: ICU  CRITICAL CARE Performed by: Lynnell Catalan   Total critical care time: 40 minutes  Critical care time was exclusive of separately billable procedures and treating other patients.  Critical care was necessary to treat or prevent imminent or life-threatening deterioration.  Critical care was time spent personally by me on the following activities: development of treatment plan with patient and/or surrogate as well as nursing, discussions with consultants, evaluation of patient's response to treatment, examination of patient, obtaining history from patient or surrogate, ordering and performing treatments and interventions, ordering and review of laboratory studies, ordering and review of radiographic studies, pulse oximetry, re-evaluation of patient's condition and participation in multidisciplinary rounds.  Lynnell Catalan, MD [facility] ICU Physician [facility]  Pager: [phone] Mobile: [phone] After hours: [phone].

## 2020-09-26 NOTE — Progress Notes (Signed)
TCTS Evening Rounds  POD #3 s/p CABG IABP out Remains on epi/milrinone Good CI  PE: BP (!) 136/45   Pulse 93   Temp 99.32 F (37.4 C)   Resp (!) 22   Ht 5\' 6"  (1.676 m)   Wt 89.9 kg   SpO2 100%   BMI 31.99 kg/m  Intubated/sedated RRR Diminished BS bibasilar 2+ edema   Intake/Output Summary (Last 24 hours) at 09/26/2020 1649 Last data filed at 09/26/2020 1600 Gross per 24 hour  Intake 4891.36 ml  Output 3670 ml  Net 1221.36 ml   A/p: Wean sedation tomorrow Repeat labs Continue supportive care Mitsuye Schrodt Z. 11/26/2020, MD [phone]

## 2020-09-26 NOTE — Progress Notes (Signed)
Inpatient Diabetes Program Recommendations  AACE/ADA: New Consensus Statement on Inpatient Glycemic Control (2015)  Target Ranges:  Prepandial:   less than 140 mg/dL      Peak postprandial:   less than 180 mg/dL (1-2 hours)      Critically ill patients:  140 - 180 mg/dL   Lab Results  Component Value Date   GLUCAP 172 (H) 09/26/2020   HGBA1C 6.3 (H) 09/25/2020    Review of Glycemic Control Results for Jack Vance, Jack Vance (MRN [id number]) as of 09/26/2020 10:50  Ref. Range 09/25/2020 16:21 09/25/2020 20:07 09/25/2020 23:26 09/26/2020 04:09 09/26/2020 08:01  Glucose-Capillary Latest Ref Range: 70 - 99 mg/dL 370 (H) 964 (H) 383 (H) 180 (H) 172 (H)   Diabetes history: None-A1C=6.3% Outpatient Diabetes medications: None Current orders for Inpatient glycemic control:  TCTS q 4 hours  Inpatient Diabetes Program Recommendations:    Blood sugars>goal.  Please consider adding Levemir 8 units bid.   Thanks,  Beryl Meager, RN, BC-ADM Inpatient Diabetes Coordinator Pager [phone] (8a-5p)

## 2020-09-26 NOTE — Progress Notes (Signed)
Subjective:  Patient remains intubated and sedated.  Has not had any bowel movement yet.  Continues to be on IV Lasix drip and inotropic support including norepinephrine, vasopressin and milrinone.  Intake/Output from previous day:  I/O last 3 completed shifts: In: 4166 [I.V.:6291.4; Blood:630; NG/GT:740; IV Piggyback:897.6] Out: 0630 [Urine:2325; Emesis/NG output:1280; Chest Tube:240] Total I/O In: 564.7 [I.V.:466.8; IV Piggyback:97.9] Out: 380 [Urine:380]  Blood pressure (!) 128/41, pulse 85, temperature (!) 85 F (29.4 C), resp. rate (!) 22, height $RemoveBe'5\' 6"'[id number]$  (1.601 m), weight 89.9 kg, SpO2 100 %. Physical Exam Constitutional:      Appearance: He is obese. He is not ill-appearing.     Interventions: He is sedated and intubated. Cervical collar in place.  HENT:     Mouth/Throat:     Mouth: Mucous membranes are moist.  Neck:     Vascular: No carotid bruit or JVD.  Cardiovascular:     Rate and Rhythm: Normal rate and regular rhythm.     Pulses: Intact distal pulses.     Heart sounds: Normal heart sounds. No murmur heard. No gallop.   Pulmonary:     Effort: Pulmonary effort is normal. He is intubated.     Breath sounds: Normal breath sounds.  Abdominal:     General: Bowel sounds are decreased.     Palpations: Abdomen is soft.     Hernia: A hernia is present. Hernia is present in the ventral area (Surgically repaired ventral hernia.).  Musculoskeletal:        General: No swelling.     Right lower leg: Edema present.     Left lower leg: Edema present.  Skin:    General: Skin is warm.     Capillary Refill: Capillary refill takes less than 2 seconds.  Neurological:     Comments: Sedated     Lab Results: BMP BNP (last 3 results) Recent Labs    09/23/20 0311  BNP 1,191.8*    ProBNP (last 3 results) No results for input(s): PROBNP in the last 8760 hours. BMP Latest Ref Rng & Units 09/26/2020 09/26/2020 09/26/2020  Glucose 70 - 99 mg/dL - 201(H) -  BUN 8 - 23 mg/dL - 56(H) -   Creatinine 0.61 - 1.24 mg/dL - 2.30(H) -  Sodium 135 - 145 mmol/L 134(L) 132(L) 134(L)  Potassium 3.5 - 5.1 mmol/L 3.8 3.8 3.8  Chloride 98 - 111 mmol/L - 101 -  CO2 22 - 32 mmol/L - 19(L) -  Calcium 8.9 - 10.3 mg/dL - 7.1(L) -   Hepatic Function Latest Ref Rng & Units 09/26/2020 09/23/2020 12/04/2017  Total Protein 6.5 - 8.1 g/dL 4.5(L) 7.2 7.2  Albumin 3.5 - 5.0 g/dL 1.9(L) 3.5 2.9(L)  AST 15 - 41 U/L 54(H) 131(H) 18  ALT 0 - 44 U/L 9 94(H) 32  Alk Phosphatase 38 - 126 U/L 49 77 73  Total Bilirubin 0.3 - 1.2 mg/dL 0.6 0.9 0.3   CBC Latest Ref Rng & Units 09/26/2020 09/26/2020 09/26/2020  WBC 4.0 - 10.5 K/uL - 5.4 -  Hemoglobin 13.0 - 17.0 g/dL 10.5(L) 10.2(L) 9.9(L)  Hematocrit 39.0 - 52.0 % 31.0(L) 30.8(L) 29.0(L)  Platelets 150 - 400 K/uL - 61(L) -   Lipid Panel     Component Value Date/Time   CHOL 65 09/25/2020 0355   TRIG 118 09/25/2020 0355   HDL 13 (L) 09/25/2020 0355   CHOLHDL 5.0 09/25/2020 0355   VLDL 24 09/25/2020 0355   LDLCALC 28 09/25/2020 0355   Cardiac Panel (last  3 results) No results for input(s): CKTOTAL, CKMB, TROPONINI, RELINDX in the last 72 hours.  HEMOGLOBIN A1C Lab Results  Component Value Date   HGBA1C 6.3 (H) 09/25/2020   MPG 134.11 09/25/2020   TSH No results for input(s): TSH in the last 8760 hours. Imaging: DG Abd 1 View  Result Date: 09/26/2020 CLINICAL DATA:  Aspiration, ileus, post CABG EXAM: ABDOMEN - 1 VIEW COMPARISON:  Portable exam 0523 hours compared to 09/25/2020 FINDINGS: Tip of nasogastric tube projects over distal gastric antrum. Few air-filled loops of nondistended bowel in RIGHT upper quadrant. Paucity of bowel gas throughout remainder of abdomen. No urinary tract calcification or acute osseous findings. IMPRESSION: Nonspecific bowel gas pattern. Electronically Signed   By: Lavonia Dana M.D.   On: 09/26/2020 08:11   DG Abd 1 View  Result Date: 09/25/2020 CLINICAL DATA:  Ileus. EXAM: ABDOMEN - 1 VIEW COMPARISON:  One-view chest x-ray  09/25/2020 and 09/24/2020. FINDINGS: Mildly dilated loops of bowel are present in the central abdomen. Bowel gas pattern is otherwise unremarkable. No definite free air is present on the supine images. Side port of the NG tube is in the stomach. Inferior Swan-Ganz catheter terminates distally in the right pulmonary artery, unchanged. Bilateral pleural effusions noted. IMPRESSION: 1. Mildly dilated loops of bowel in the central abdomen compatible with ileus. 2. Bilateral pleural effusions. Electronically Signed   By: San Morelle M.D.   On: 09/25/2020 09:01   DG Chest Port 1 View  Result Date: 09/26/2020 CLINICAL DATA:  Post CABG, aspiration, ileus, intubation EXAM: PORTABLE CHEST 1 VIEW COMPARISON:  Portable exam 0517 hours compared to 09/25/2020 FINDINGS: Tip of endotracheal tube projects 3.1 cm above carina. Nasogastric tube extends into stomach. Swan-Ganz catheter tip projects over RIGHT hilum. RIGHT jugular central venous catheter tip projects over SVC. External pacing leads present. Enlargement of cardiac silhouette with pulmonary vascular congestion. Mild pulmonary infiltrates favoring pulmonary edema. Bibasilar atelectasis and probable small RIGHT pleural effusion. No pneumothorax. IMPRESSION: Bibasilar atelectasis and probable small RIGHT pleural effusion. Enlargement of cardiac silhouette with vascular congestion and minimal pulmonary edema. Electronically Signed   By: Lavonia Dana M.D.   On: 09/26/2020 08:10   DG CHEST PORT 1 VIEW  Result Date: 09/25/2020 CLINICAL DATA:  Possible aspiration. EXAM: PORTABLE CHEST 1 VIEW COMPARISON:  Sep 25, 2020 (6:32 a.m.) FINDINGS: There is stable endotracheal tube, nasogastric tube and right internal jugular venous catheter positioning. Stable Swan-Ganz catheter positioning is also noted. Multiple sternal wires are seen. Chronic appearing increased lung markings are seen. There is no evidence of a pleural effusion or pneumothorax. The cardiac silhouette is  mildly enlarged. A radiopaque fixation plate and screws are seen within the proximal right humerus. IMPRESSION: No significant interval change when compared to the prior study, dated Sep 25, 2020 (6:32 a.m.). Electronically Signed   By: Virgina Norfolk M.D.   On: 09/25/2020 19:53   DG CHEST PORT 1 VIEW  Result Date: 09/25/2020 CLINICAL DATA:  Follow-up.  Recent CABG surgery. EXAM: PORTABLE CHEST 1 VIEW COMPARISON:  09/24/2020 and earlier exams. FINDINGS: On stable changes from recent CABG surgery. No mediastinal widening. Patchy interstitial and hazy airspace lung opacities are noted, with more confluent opacity in the left medial lung base, appearance similar to the exams from 05/07 and 09/23/2020. No pneumothorax. Swan-Ganz catheter tip projects in the peripheral right pulmonary artery. Right internal jugular central venous line, endotracheal tube and nasal/orogastric tube are stable and well positioned. IMPRESSION: 1. No change in the bilateral interstitial  and hazy airspace lung opacities favored to reflect pulmonary edema. No new lung abnormalities. 2. No pneumothorax. 3. Tip of the Swan-Ganz catheter appears more peripheral than on the prior exams. No other change in support apparatus. Electronically Signed   By: Lajean Manes M.D.   On: 09/25/2020 08:25   DG Abd Portable 1V  Result Date: 09/26/2020 CLINICAL DATA:  Feeding tube placement EXAM: PORTABLE ABDOMEN - 1 VIEW COMPARISON:  Portable exam 1046 hours compared to 0523 hours FINDINGS: Rotated exam. Tip of feeding tube projects over descending duodenum. Bibasilar lung opacities and questionable small RIGHT pleural effusion. Swan-Ganz catheter projects over RIGHT perihilar region. Visualized bowel gas pattern unremarkable. IMPRESSION: Tip of feeding tube projects over descending duodenum. Electronically Signed   By: Lavonia Dana M.D.   On: 09/26/2020 11:04    Cardiac database: CABG 09/23/2020: (CABG X3, LIMA LAD, reverse SVG to distal right,  reverse SVG to ramus) for critical left main disease.  EKG: 05/06/22S. Tachy @ 101/min. ST elevation in aVR 2 mm and profound global ST depression 2 mm.  09/24/2020: Normal sinus rhythm, 66 bpm, second-degree type I AV block, diffuse ST-T changes.  Compared to prior EKG dated 09/24/2018 diffuse ST T changes remain stable but aVR elevation is now absent.  Echocardiogram: 09/23/2020: 1. Left ventricular ejection fraction, by estimation, is <20%. The left ventricle has severely decreased function. Severe global hypokinesis with akinesis of anterolateral wall. Grade II diastolic dysfunction (pseudonormalization). Elevated left atrial  pressure. 2. Right ventricular systolic function is moderately reduced. The right ventricular size is normal. There is moderately elevated pulmonary artery systolic pressure. 3. Left atrial size was mildly dilated. 4. The mitral valve is grossly normal. Mild to moderate mitral valve regurgitation. 5. Tricuspid valve regurgitation is moderate. Peak RA-RV gradient 41 mmHg.  Heart catheterization: 09/23/2020:  Ost RCA lesion is 80% stenosed.  Ost LM to Mid LM lesion is 90% stenosed.  1st Diag lesion is 60% stenosed.  Ost Cx to Prox Cx lesion is 90% stenosed.  LV end diastolic pressure is severely elevated.  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  Right and left heart catheterization 09/23/2020: RA 10/9, mean 9 mmHg. RV 50/7, EDP 11 mmHg PA 48/26, mean 36 mmHg. PA saturation 69%. PW 26/34, mean 26 mmHg. Aortic saturation 100%. Cardiac output 8.1, cardiac index 4.21 by Fick. Patient on the ventilator.  LV: 108/15, EDP 27 mmHg. There was no pressure gradient across the aortic valve. LM: Irregular, ulcerated, 90% ostial stenosis and proximal stenosis. Extends into the circumflex coronary artery. LAD: Very minimal disease. D1 is large, proximal 50 to 60% stenosis. RI: Ostium has 50 to 60% stenosis. CX: Ulcerated  ostial 90% stenosis. RCA: Ostium 80% stenosis. Severe damping with engagement of the artery with no backflow.  Scheduled Meds: . sodium chloride   Intravenous Once  . acetaminophen (TYLENOL) oral liquid 160 mg/5 mL  650 mg Per Tube Once   Or  . acetaminophen  650 mg Rectal Once  . aspirin EC  325 mg Oral Daily   Or  . aspirin  324 mg Per Tube Daily  . bisacodyl  10 mg Rectal Daily  . chlorhexidine gluconate (MEDLINE KIT)  15 mL Mouth Rinse BID  . Chlorhexidine Gluconate Cloth  6 each Topical Daily  . insulin aspart  0-24 Units Subcutaneous Q4H  . ipratropium-albuterol  3 mL Nebulization Q6H  . mouth rinse  15 mL Mouth Rinse 10 times per day  . metoCLOPramide (REGLAN) injection  10 mg  Intravenous Q6H  . pantoprazole (PROTONIX) IV  40 mg Intravenous Q24H  . sodium chloride flush  10-40 mL Intracatheter Q12H  . sodium chloride flush  3 mL Intravenous Q12H   Continuous Infusions: . sodium chloride 20 mL/hr at 09/26/20 1100  . sodium chloride    . sodium chloride 20 mL/hr at 09/26/20 1100  . amiodarone 30 mg/hr (09/26/20 1100)  . dexmedetomidine (PRECEDEX) IV infusion 0.7 mcg/kg/hr (09/26/20 1100)  . epinephrine 1 mcg/min (09/26/20 1100)  . fentaNYL infusion INTRAVENOUS 100 mcg/hr (09/26/20 1100)  . furosemide (LASIX) 200 mg in dextrose 5% 100 mL (59m/mL) infusion 10 mg/hr (09/26/20 1100)  . insulin 1.9 Units/hr (09/25/20 0600)  . lactated ringers 20 mL/hr at 09/26/20 0300  . lactated ringers 20 mL/hr at 09/26/20 1100  . midazolam 0.5 mg/hr (09/26/20 1100)  . milrinone 0.25 mcg/kg/min (09/26/20 1100)  . norepinephrine (LEVOPHED) Adult infusion 5 mcg/min (09/26/20 1100)  . piperacillin-tazobactam (ZOSYN)  IV Stopped (09/26/20 1007)  . potassium chloride 50 mL/hr at 09/26/20 1100  . vasopressin 0.03 Units/min (09/26/20 1100)   PRN Meds:.sodium chloride, albuterol, dextrose, fentaNYL (SUBLIMAZE) injection, metoprolol tartrate, midazolam, ondansetron (ZOFRAN) IV, sodium chloride  flush, sodium chloride flush  Assessment/Plan:  1.  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  Acute kidney failure stage IIIb due to ATN from cardiogenic shock 3.  Chronic iron deficiency anemia, iron studies revealing severely reduced iron stores 4.  Reactive airway disease 5.  Postop atrial fibrillation with RVR, presently on IV amiodarone and maintained sinus rhythm.  Recommendation: Intra-aortic balloon pump is on the been, 3: 1, hemodynamic stable on standby, hence would certainly take intra-aortic balloon pump off today.  He is on minimal dose of pressors, hopefully this can be weaned off soon following extubation.  Acute RV dysfunction is related to cardiogenic shock and secondary pulmonary hypertension.  This should improve once he is extubated as well.  Presently heparin is on hold, patient is on aspirin only, in the absence of any bleeding, I would like to add Plavix as DAPT for ACS.  Will discuss with the CTS.  Do not think you need long-term anticoagulation.  We will repeat his troponin, presently on Lasix drip, will continue to trend and may need adjustment in the drip.  I will ask Dr. DPierre Balito take over patient's care for now for cardiogenic shock and I will follow sidelines. 35 minutes of critical care time and evaluation of labs, hemodynamics and complex decision making.   JAdrian Prows MD, [facility]5/01/2021, 11:26 AM Office: [phone]: [phone]

## 2020-09-26 NOTE — Progress Notes (Addendum)
Initial Nutrition Assessment  DOCUMENTATION CODES:   Not applicable  INTERVENTION:   No BM x 3 days- monitor for results, may need to strengthen regimen  Trickle tube feeding:  -Pivot 1.5 @ 20 ml/hr via Cortrak -Goal rate- 60 ml/hr (1440 ml) -ProSource TF 45 ml BID  Goal rate provides: 2240 kcals, 157 grams protein, 1093 ml free water.   NUTRITION DIAGNOSIS:   Increased nutrient needs related to post-op healing as evidenced by estimated needs.  GOAL:   Patient will meet greater than or equal to 90% of their needs  MONITOR:   Vent status,Labs,I & O's,Skin,TF tolerance,Weight trends  REASON FOR ASSESSMENT:   Consult Enteral/tube feeding initiation and management  ASSESSMENT:   Patient with PMH significant for DDD, asthma, HTN, and large ventral hernia. Presented to [facility] ED with shortness of breath, was found to be in cardiogenic shock with NSTEMI.   5/6- s/p heart cath, confirmed critical CAD, s/p emergent CABG x3 wih IABP placement 5/9- s/p IABP removed   Pt discussed during ICU rounds and with RN.   Requiring pressors. Continues to diurese with lasix. Per RN, Vital High Protein started on Saturday at 40 ml/hr per the tube feeding protocol. Patient had vomiting episode and feeds were turned off. OG with just under 200 ml output this am.   Cortrak placed at bedside this am. Xray reads tip located in the duodenum, question reading as patient has large right sided hernia that is likely compressing stomach/small bowel. Concern patient may have ongoing vomiting complication if tip located in the stomach. It is noted that patient underwent hernia repair 25 years ago, but has since had complications with it growing back. Per notes patient often struggles with constipation and abdominal pain. Discussed concerns of tube placement with MD. Orders placed to start trickles per MD.   Of note patient has not had bowel movement since admission. He is currently receiving senokot and  reglan.   Patient is currently intubated on ventilator support MV: 11.8 L/min Temp (24hrs), Avg:98 F (36.7 C), Min:85 F (29.4 C), Max:99.32 F (37.4 C)  UOP: 1875 ml x 24 hrs  OG: 1250 ml x 24 hrs  Chest tubes: 60 ml x 24 hrs   Drips: precedex, epinephrine, lasix in D5 @ 5 ml/hr, levophed, vasopressin  Medications: dulcolax, SS novolog, levemir, 5 mg reglan QID Labs: Na 134 (L) Mg 2.5 (H) Cr 2.30- slightly down from yesterday  NUTRITION - FOCUSED PHYSICAL EXAM:  Flowsheet Row Most Recent Value  Orbital Region No depletion  Upper Arm Region No depletion  Thoracic and Lumbar Region Unable to assess  Buccal Region No depletion  Temple Region No depletion  Clavicle Bone Region Mild depletion  Clavicle and Acromion Bone Region Mild depletion  Scapular Bone Region Unable to assess  Dorsal Hand No depletion  Edema (RD Assessment) Mild  Hair Reviewed  Eyes Unable to assess  Mouth Unable to assess  Skin Reviewed  Nails Reviewed     Diet Order:   Diet Order    None      EDUCATION NEEDS:   Not appropriate for education at this time  Skin:  Skin Assessment: Skin Integrity Issues: Skin Integrity Issues:: Incisions Incisions: bilateral legs, chest  Last BM:  PTA  Height:   Ht Readings from Last 1 Encounters:  09/23/20 5\' 6"  (1.676 m)    Weight:   Wt Readings from Last 1 Encounters:  09/26/20 89.9 kg   BMI:  Body mass index is 31.99 kg/m.  Estimated Nutritional Needs:   Kcal:  5498-2641 kcal  Protein:  135-180 grams  Fluid:  >/= 2 L/day  Vanessa Kick RD, LDN Clinical Nutrition Pager listed in AMION

## 2020-09-27 ENCOUNTER — Inpatient Hospital Stay (HOSPITAL_COMMUNITY): Payer: No Typology Code available for payment source

## 2020-09-27 DIAGNOSIS — R0603 Acute respiratory distress: Secondary | ICD-10-CM | POA: Diagnosis not present

## 2020-09-27 LAB — CBC
HCT: 25.7 % — ABNORMAL LOW (ref 39.0–52.0)
Hemoglobin: 8.6 g/dL — ABNORMAL LOW (ref 13.0–17.0)
MCH: 27.2 pg (ref 26.0–34.0)
MCHC: 33.5 g/dL (ref 30.0–36.0)
MCV: 81.3 fL (ref 80.0–100.0)
Platelets: 80 10*3/uL — ABNORMAL LOW (ref 150–400)
RBC: 3.16 MIL/uL — ABNORMAL LOW (ref 4.22–5.81)
RDW: 17.6 % — ABNORMAL HIGH (ref 11.5–15.5)
WBC: 10.9 10*3/uL — ABNORMAL HIGH (ref 4.0–10.5)
nRBC: 0.5 % — ABNORMAL HIGH (ref 0.0–0.2)

## 2020-09-27 LAB — BASIC METABOLIC PANEL
Anion gap: 13 (ref 5–15)
Anion gap: 16 — ABNORMAL HIGH (ref 5–15)
BUN: 68 mg/dL — ABNORMAL HIGH (ref 8–23)
BUN: 73 mg/dL — ABNORMAL HIGH (ref 8–23)
CO2: 20 mmol/L — ABNORMAL LOW (ref 22–32)
CO2: 21 mmol/L — ABNORMAL LOW (ref 22–32)
Calcium: 7.3 mg/dL — ABNORMAL LOW (ref 8.9–10.3)
Calcium: 7.4 mg/dL — ABNORMAL LOW (ref 8.9–10.3)
Chloride: 99 mmol/L (ref 98–111)
Chloride: 98 mmol/L (ref 98–111)
Creatinine, Ser: 2.98 mg/dL — ABNORMAL HIGH (ref 0.61–1.24)
Creatinine, Ser: 3.22 mg/dL — ABNORMAL HIGH (ref 0.61–1.24)
GFR, Estimated: 21 mL/min — ABNORMAL LOW (ref >60)
GFR, Estimated: 19 mL/min — ABNORMAL LOW (ref >60)
Glucose, Bld: 100 mg/dL — ABNORMAL HIGH (ref 70–99)
Glucose, Bld: 96 mg/dL (ref 70–99)
Potassium: 3.9 mmol/L (ref 3.5–5.1)
Potassium: 3.5 mmol/L (ref 3.5–5.1)
Sodium: 132 mmol/L — ABNORMAL LOW (ref 135–145)
Sodium: 135 mmol/L (ref 135–145)

## 2020-09-27 LAB — GLUCOSE, CAPILLARY
Glucose-Capillary: 109 mg/dL — ABNORMAL HIGH (ref 70–99)
Glucose-Capillary: 89 mg/dL (ref 70–99)
Glucose-Capillary: 92 mg/dL (ref 70–99)
Glucose-Capillary: 100 mg/dL — ABNORMAL HIGH (ref 70–99)
Glucose-Capillary: 98 mg/dL (ref 70–99)

## 2020-09-27 LAB — COOXEMETRY PANEL
Carboxyhemoglobin: 1.2 % (ref 0.5–1.5)
Methemoglobin: 1.4 % (ref 0.0–1.5)
O2 Saturation: 77.1 %
Total hemoglobin: 9.1 g/dL — ABNORMAL LOW (ref 12.0–16.0)

## 2020-09-27 LAB — MAGNESIUM
Magnesium: 2.4 mg/dL (ref 1.7–2.4)
Magnesium: 2.4 mg/dL (ref 1.7–2.4)

## 2020-09-27 LAB — POCT ACTIVATED CLOTTING TIME: Activated Clotting Time: 166 seconds

## 2020-09-27 MED ORDER — ASPIRIN 300 MG RE SUPP
300.0000 mg | Freq: Every day | RECTAL | Status: DC
  Administered 2020-09-27 – 2020-10-01 (×5): 300 mg via RECTAL
  Filled 2020-09-27 (×5): qty 1

## 2020-09-27 MED ORDER — POTASSIUM CHLORIDE 10 MEQ/50ML IV SOLN
10.0000 meq | INTRAVENOUS | Status: AC
  Administered 2020-09-27 (×2): 10 meq via INTRAVENOUS
  Filled 2020-09-27: qty 50

## 2020-09-27 MED ORDER — FLEET ENEMA 7-19 GM/118ML RE ENEM
1.0000 | ENEMA | Freq: Once | RECTAL | Status: DC
  Filled 2020-09-27: qty 1

## 2020-09-27 MED ORDER — ACETAMINOPHEN 650 MG RE SUPP
650.0000 mg | Freq: Three times a day (TID) | RECTAL | Status: DC | PRN
  Administered 2020-09-27 – 2020-10-01 (×5): 650 mg via RECTAL
  Filled 2020-09-27 (×4): qty 1

## 2020-09-27 NOTE — Progress Notes (Addendum)
Advanced Heart Failure Rounding Note   Subjective:    Underwent emergent CABG on 5/6 for critical LM disease (LIMA -> LAD, SVG -> OM, SVG -> RCA)  5/8 Possible aspiration. A fib --> started amio drip.  Given 2u PRBCs. Hypotensive in the evening and started on vasopressin + antibiotics.  5/9 IABP removed  Vomiting this morning. NG placed this morning with 800 cc within 1 hour.    Sedated on vent. FiO2 40%   Currently on milrinone 0.25, Norepi 5 mcg,  Epi 1   Diuresing with lasix drip.  Urine output trending down. Weight unchanged.  CVP 6   Creatinine 1.83 => 1.95=>2.3 =>3.    Swan numbers: CVP 6 PA 24/14  CO 7.6  CI 3.8   PAPi 1.7  Objective:   Weight Range:  Vital Signs:   Temp:  [85 F (29.4 C)-99.32 F (37.4 C)] 98.96 F (37.2 C) (05/10 0600) Pulse Rate:  [85-93] 93 (05/10 0823) Resp:  [0-22] 22 (05/10 0600) BP: (101-144)/(41-78) 144/49 (05/10 0823) SpO2:  [90 %-100 %] 100 % (05/10 0823) Arterial Line BP: (120-178)/(39-83) 140/80 (05/10 0600) FiO2 (%):  [40 %] 40 % (05/10 0823) Weight:  [90 kg] 90 kg (05/10 0500)    Weight change: Filed Weights   09/25/20 0530 09/26/20 0319 09/27/20 0500  Weight: 87.1 kg 89.9 kg 90 kg    Intake/Output:   Intake/Output Summary (Last 24 hours) at 09/27/2020 0858 Last data filed at 09/27/2020 0848 Gross per 24 hour  Intake 2506.67 ml  Output 2195 ml  Net 311.67 ml    CVP 6 General:  Intubated/sedated   HEENT: ETT/NG + Cortrak  Neck: supple. no JVD. Carotids 2+ bilat; no bruits. No lymphadenopathy or thryomegaly appreciated. Cor: PMI nondisplaced. Regular rate & rhythm. No rubs, gallops or murmurs. Lungs: clear Abdomen: soft, nontender, hernia distended. No hepatosplenomegaly. No bruits or masses. Good bowel sounds. Extremities: no cyanosis, clubbing, rash, edema. R femoral swan Neuro: intubated/sedated.   Telemetry: SR 90s  Labs: Basic Metabolic Panel: Recent Labs  Lab 09/24/20 1516 09/24/20 2002  09/25/20 0355 09/25/20 0402 09/25/20 1609 09/25/20 1739 09/25/20 2230 09/26/20 0248 09/26/20 0413 09/26/20 0539 09/27/20 0518  NA 135   < > 135   < > 132*   < > 134* 134* 132* 134* 132*  K 3.8   < > 3.3*   < > 3.9   < > 3.9 3.8 3.8 3.8 3.9  CL 107  --  105  --  103  --   --   --  101  --  99  CO2 21*  --  21*  --  18*  --   --   --  19*  --  20*  GLUCOSE 141*  --  141*  --  217*  --   --   --  201*  --  100*  BUN 43*  --  49*  --  55*  --   --   --  56*  --  68*  CREATININE 1.83*  --  1.95*  --  2.34*  --   --   --  2.30*  --  2.98*  CALCIUM 7.9*  --  7.5*  --  7.2*  --   --   --  7.1*  --  7.3*  MG 3.3*  --  3.1*  --  2.7*  --   --   --  2.5*  --  2.4  PHOS 5.1*  --  4.4  --  3.5  --   --   --   --   --   --    < > = values in this interval not displayed.    Liver Function Tests: Recent Labs  Lab 09/23/20 0311 09/26/20 0413  AST 131* 54*  ALT 94* 9  ALKPHOS 77 49  BILITOT 0.9 0.6  PROT 7.2 4.5*  ALBUMIN 3.5 1.9*   No results for input(s): LIPASE, AMYLASE in the last 168 hours. No results for input(s): AMMONIA in the last 168 hours.  CBC: Recent Labs  Lab 09/23/20 0311 09/23/20 0859 09/24/20 0408 09/24/20 1516 09/24/20 2002 09/25/20 0355 09/25/20 0402 09/25/20 2009 09/25/20 2230 09/25/20 2329 09/26/20 0248 09/26/20 0413 09/26/20 0539  WBC 14.5*   < > 14.9* 16.0*  --  10.2  --  7.6  --   --   --  5.4  --   NEUTROABS 12.5*  --   --   --   --   --   --   --   --   --   --   --   --   HGB 7.5*   < > 8.5* 8.2*   < > 7.8*   < > 10.8*  11.6* 9.9* 10.3* 9.9* 10.2* 10.5*  HCT 24.7*   < > 27.0* 25.6*   < > 24.7*   < > 34.0*  34.0* 29.0* 31.6* 29.0* 30.8* 31.0*  MCV 80.7   < > 83.9 83.9  --  84.9  --  84.8  --   --   --  82.6  --   PLT 269   < > 133* 121*  --  119*  --  83*  --   --   --  61*  --    < > = values in this interval not displayed.    Cardiac Enzymes: Recent Labs  Lab 09/23/20 0554  CKTOTAL 455*  CKMB 65.8*    BNP: BNP (last 3  results) Recent Labs    09/23/20 0311  BNP 1,191.8*    ProBNP (last 3 results) No results for input(s): PROBNP in the last 8760 hours.    Other results:  Imaging: DG Abd 1 View  Result Date: 09/27/2020 CLINICAL DATA:  Ileus EXAM: ABDOMEN - 1 VIEW COMPARISON:  09/26/2020 FINDINGS: Gas-filled, nondistended loops of small bowel in the central abdomen measuring up to 3.5 cm in caliber. Non weighted enteric feeding tube projects with tip below the diaphragm in the vicinity of the pylorus. No obvious free air on single supine radiograph which incompletely covers the abdomen. IMPRESSION: Gas-filled, nondistended loops of small bowel in the central abdomen measuring up to 3.5 cm in caliber. No overt evidence of ileus or bowel obstruction. Electronically Signed   By: Eddie Candle M.D.   On: 09/27/2020 08:36   DG Abd 1 View  Result Date: 09/26/2020 CLINICAL DATA:  Aspiration, ileus, post CABG EXAM: ABDOMEN - 1 VIEW COMPARISON:  Portable exam 0523 hours compared to 09/25/2020 FINDINGS: Tip of nasogastric tube projects over distal gastric antrum. Few air-filled loops of nondistended bowel in RIGHT upper quadrant. Paucity of bowel gas throughout remainder of abdomen. No urinary tract calcification or acute osseous findings. IMPRESSION: Nonspecific bowel gas pattern. Electronically Signed   By: Lavonia Dana M.D.   On: 09/26/2020 08:11   DG Chest Port 1 View  Result Date: 09/26/2020 CLINICAL DATA:  Post CABG, aspiration, ileus, intubation EXAM: PORTABLE CHEST 1 VIEW COMPARISON:  Portable  exam 0517 hours compared to 09/25/2020 FINDINGS: Tip of endotracheal tube projects 3.1 cm above carina. Nasogastric tube extends into stomach. Swan-Ganz catheter tip projects over RIGHT hilum. RIGHT jugular central venous catheter tip projects over SVC. External pacing leads present. Enlargement of cardiac silhouette with pulmonary vascular congestion. Mild pulmonary infiltrates favoring pulmonary edema. Bibasilar atelectasis  and probable small RIGHT pleural effusion. No pneumothorax. IMPRESSION: Bibasilar atelectasis and probable small RIGHT pleural effusion. Enlargement of cardiac silhouette with vascular congestion and minimal pulmonary edema. Electronically Signed   By: Lavonia Dana M.D.   On: 09/26/2020 08:10   DG CHEST PORT 1 VIEW  Result Date: 09/25/2020 CLINICAL DATA:  Possible aspiration. EXAM: PORTABLE CHEST 1 VIEW COMPARISON:  Sep 25, 2020 (6:32 a.m.) FINDINGS: There is stable endotracheal tube, nasogastric tube and right internal jugular venous catheter positioning. Stable Swan-Ganz catheter positioning is also noted. Multiple sternal wires are seen. Chronic appearing increased lung markings are seen. There is no evidence of a pleural effusion or pneumothorax. The cardiac silhouette is mildly enlarged. A radiopaque fixation plate and screws are seen within the proximal right humerus. IMPRESSION: No significant interval change when compared to the prior study, dated Sep 25, 2020 (6:32 a.m.). Electronically Signed   By: Virgina Norfolk M.D.   On: 09/25/2020 19:53   DG Abd Portable 1V  Result Date: 09/26/2020 CLINICAL DATA:  Feeding tube placement EXAM: PORTABLE ABDOMEN - 1 VIEW COMPARISON:  Portable exam 1046 hours compared to 0523 hours FINDINGS: Rotated exam. Tip of feeding tube projects over descending duodenum. Bibasilar lung opacities and questionable small RIGHT pleural effusion. Swan-Ganz catheter projects over RIGHT perihilar region. Visualized bowel gas pattern unremarkable. IMPRESSION: Tip of feeding tube projects over descending duodenum. Electronically Signed   By: Lavonia Dana M.D.   On: 09/26/2020 11:04     Medications:     Scheduled Medications: . sodium chloride   Intravenous Once  . acetaminophen (TYLENOL) oral liquid 160 mg/5 mL  650 mg Per Tube Once   Or  . acetaminophen  650 mg Rectal Once  . aspirin  300 mg Rectal Daily  . bisacodyl  10 mg Rectal Daily  . chlorhexidine gluconate (MEDLINE  KIT)  15 mL Mouth Rinse BID  . Chlorhexidine Gluconate Cloth  6 each Topical Daily  . insulin aspart  0-24 Units Subcutaneous Q4H  . insulin detemir  8 Units Subcutaneous BID  . ipratropium-albuterol  3 mL Nebulization Q6H  . mouth rinse  15 mL Mouth Rinse 10 times per day  . metoCLOPramide (REGLAN) injection  5 mg Intravenous Q6H  . pantoprazole (PROTONIX) IV  40 mg Intravenous Q24H  . sodium chloride flush  10-40 mL Intracatheter Q12H  . sodium chloride flush  3 mL Intravenous Q12H    Infusions: . sodium chloride Stopped (09/26/20 1340)  . sodium chloride    . sodium chloride 20 mL/hr at 09/27/20 0400  . amiodarone 30 mg/hr (09/27/20 0400)  . dexmedetomidine (PRECEDEX) IV infusion 0.7 mcg/kg/hr (09/27/20 0400)  . epinephrine 1 mcg/min (09/27/20 0400)  . fentaNYL infusion INTRAVENOUS 100 mcg/hr (09/27/20 0400)  . furosemide (LASIX) 200 mg in dextrose 5% 100 mL (81m/mL) infusion 10 mg/hr (09/27/20 0400)  . insulin 1.9 Units/hr (09/25/20 0600)  . lactated ringers 20 mL/hr at 09/27/20 0400  . lactated ringers 20 mL/hr at 09/27/20 0400  . midazolam 1 mg/hr (09/26/20 1800)  . milrinone 0.25 mcg/kg/min (09/27/20 0400)  . norepinephrine (LEVOPHED) Adult infusion 5 mcg/min (09/27/20 0400)  . piperacillin-tazobactam (ZOSYN)  IV 3.375 g (09/27/20 0547)  . vasopressin Stopped (09/26/20 1536)    PRN Medications: sodium chloride, albuterol, dextrose, fentaNYL (SUBLIMAZE) injection, metoprolol tartrate, midazolam, ondansetron (ZOFRAN) IV, sodium chloride flush, sodium chloride flush   Assessment/Plan:   1. 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  - hemodynamics ok. CO-OX stable.  -  milrinone 0.25 + norepi 5 +epi 1 mcg -CVP 6-7  Continue lasix drip. Worsening renal function.   2. NSTEMI with critical CAD - 95% LM  and high grade ostial RCA - CABG on 5/6 for critical LM disease (LIMA -> LAD, SVG -> OM, SVG -> RCA) - Continue ASA/statin  - Consider Plavix now that IABP out but abdominal distention may need to wait.   3. Acute hypoxic respiratory failure due to pulmonary edema - on vent - Possible aspiration 5/8 & 5/10  .  - On antibiotics  - CCM following  4. AKI  - due to ATN - Cr 1.0 -> 1.7 -> 1.95-->2.3 ->3 - Support hemodynamics.   5. Ventral Hernia  Increased distention today. Possible aspiration. NG placed to decompress with 800 out within 1 hour.     Length of Stay: [place] NP-C  09/27/2020, 8:58 AM  Advanced Heart Failure Team Pager [phone] (M-F; 7a - 4p)  Please contact [facility] for night-coverage after hours (4p -7a ) and weekends on [email or web address]  Agree.  Remains intubated on multiple pressors. IABP out. Had several episodes of emesis last night. TFs stopped. Swan numbers improved. Diuresing on lasix gtt. Weight unchanged. Rhythm stable   General:  Intubated/sedated HEENT: normal +ETT Neck: supple. + swan Carotids 2+ bilat; no bruits. No lymphadenopathy or thryomegaly appreciated. Cor: PMI nondisplaced. + sternal dressing and CTs Regular rate & rhythm. No rubs, gallops or murmurs. Lungs: coarse Abdomen: soft, nontender, nondistended. + ventral hernia   Good bowel sounds. Extremities: no cyanosis, clubbing, rash, 3+ edema Neuro:intubated/sedated   Hemodynamics much improved. Can wean off epi. Continue milrinone. Use NE to keep MAP > 70. Continue lasix gtt. Continue bowel regimen. Will need more fluid off and ileus to improve prior to extubation. If creatinine continues to climb may need short course of CVVHD. D/w Dr. Lynetta Mare at bedside.   CRITICAL CARE Performed by: Glori Bickers  Total critical care time: 35 minutes  Critical care time was exclusive of separately billable procedures and treating other patients.  Critical care was necessary to treat  or prevent imminent or life-threatening deterioration.  Critical care was time spent personally by me (independent of midlevel providers or residents) on the following activities: development of treatment plan with patient and/or surrogate as well as nursing, discussions with consultants, evaluation of patient's response to treatment, examination of patient, obtaining history from patient or surrogate, ordering and performing treatments and interventions, ordering and review of laboratory studies, ordering and review of radiographic studies, pulse oximetry and re-evaluation of patient's condition.  Glori Bickers, MD  12:54 PM

## 2020-09-27 NOTE — Progress Notes (Signed)
Nutrition Follow Up  DOCUMENTATION CODES:   Not applicable  INTERVENTION:   Recommend TPN if unable to extubate or tolerate tube feeding in the next 24-48 hours   No BM x 4 days- monitor for results, may need to strengthen regimen  Once able to restart tube feeding:  -Pivot 1.5 @ 20 ml/hr via Cortrak -Goal rate- 60 ml/hr (1440 ml) -ProSource TF 45 ml BID  Goal rate provides: 2240 kcals, 157 grams protein, 1093 ml free water.   NUTRITION DIAGNOSIS:   Increased nutrient needs related to post-op healing as evidenced by estimated needs.  Ongoing  GOAL:   Patient will meet greater than or equal to 90% of their needs   Not meeting   MONITOR:   Vent status,Labs,I & O's,Skin,TF tolerance,Weight trends  REASON FOR ASSESSMENT:   Consult Enteral/tube feeding initiation and management  ASSESSMENT:   Patient with PMH significant for DDD, asthma, HTN, and large ventral hernia. Presented to [facility] ED with shortness of breath, was found to be in cardiogenic shock with NSTEMI.   5/06- s/p heart cath, confirmed critical CAD, s/p emergent CABG x3 wih IABP placement 5/08- large vomiting episode, TF held 50/9- s/p IABP removed 5/10- large vomiting episode, TF held  Pressor requirement down. Creatine up and UOP slightly decreased. Agitation precludes extubation. Had another episode of vomiting this am with trickle tube feedings. Now turned off. No bowel movement since admit. Per Xray this am, no evidence of ileus or obstruction. Suspect large hernia with distention is playing role in tube feeding intolerance. Recommend TPN if unable to extubate or if unable to tolerate tube feeding in the next 24-48 hrs.   Patient remains intubated on ventilator support MV: 11.9 L/min Temp (24hrs), Avg:99.2 F (37.3 C), Min:98.96 F (37.2 C), Max:99.5 F (37.5 C)   UOP: 1370 ml x 24 hrs  OG: 750 ml x 24 hrs   Drips: precedex, 200 mg lasix in D5 @ 5 ml/hr, levophed Medications: dulcolax, SS  novolog, levemir, 5 mg reglan QID Labs: Na 132 (L) Cr 2.68- up from yesterday   Diet Order:   Diet Order    None      EDUCATION NEEDS:   Not appropriate for education at this time  Skin:  Skin Assessment: Skin Integrity Issues: Skin Integrity Issues:: Incisions Incisions: bilateral legs, chest  Last BM:  PTA  Height:   Ht Readings from Last 1 Encounters:  09/23/20 5\' 6"  (1.676 m)    Weight:   Wt Readings from Last 1 Encounters:  09/27/20 90 kg   BMI:  Body mass index is 32.02 kg/m.  Estimated Nutritional Needs:   Kcal:  11/27/20 kcal  Protein:  135-180 grams  Fluid:  >/= 2 L/day  [phone] RD, LDN Clinical Nutrition Pager listed in AMION

## 2020-09-27 NOTE — Progress Notes (Signed)
   Received call from staff nurse for increased ectopy.   Telemetry- Increase PVCs/NSVT and intermittent A fib.   Increase amio to 60 mg per hour.   Alek Borges NP-C  4:45 PM

## 2020-09-27 NOTE — Progress Notes (Signed)
NAME:  Jack Vance, MRN:  [id number], DOB:  [birth year], LOS: 4 ADMISSION DATE:  09/23/2020, CONSULTATION DATE:  09/24/2019 REFERRING MD:  Jacinto Halim, CHIEF COMPLAINT:  Acute coronary syndrome   History of Present Illness:  80 year old man who presented with cardiogenic shock from NSTEMI  Presented with new onset RSCP and severe dyspnea. Found to be in acute pulmonary edema and he was intubated and sent for urgent LHC which revealed 3V CAD.  IABP inserted and patient underwent urgent CABG.   Pertinent  Medical History   Past Medical History:  Diagnosis Date  . Arthritis   . Asthma   . DDD (degenerative disc disease), lumbar   . Hernia of abdominal wall   . RLS (restless legs syndrome)      Significant Hospital Events: Including procedures, antibiotic start and stop dates in addition to other pertinent events   . 5/6 CABG with Dr Cliffton Asters with LIMA to LAD, SVG to RCA d and SVG to ramus. LV normal and returned to ICU with no vasopressor support.  . No post-operative bleeding but poor RV performance on hemodynamics.  . 5/8 significant hemodynamic instability with labile blood pressure and vasopressor requirements yesterday.  Episode of emesis but no evidence of bowel obstruction.  Evidence of pericardial tamponade. . 5/9 improved hemodynamic, able now to tolerate balloon pump weaning and removal.  Interim History / Subjective:  Strategy.  Successfully liberated from balloon pump yesterday.  Did not tolerate trickle feeds.  NG tube now on suction.  Weaning vasopressor requirements.  Marginal urine output.  Objective   Blood pressure (!) 117/57, pulse 93, temperature 99.5 F (37.5 C), resp. rate (!) 22, height 5\' 6"  (1.676 m), weight 90 kg, SpO2 100 %. PAP: (27-40)/(15-23) 36/19 CVP:  [4 mmHg-12 mmHg] 8 mmHg CO:  [6.6 L/min-7.3 L/min] 7.3 L/min CI:  [3.3 L/min/m2-3.8 L/min/m2] 3.8 L/min/m2  Vent Mode: PRVC FiO2 (%):  [40 %] 40 % Set Rate:  [22 bmp] 22 bmp Vt Set:  [560 mL] 560  mL PEEP:  [5 cmH20] 5 cmH20 Plateau Pressure:  [22 cmH20-24 cmH20] 23 cmH20   Intake/Output Summary (Last 24 hours) at 09/27/2020 1406 Last data filed at 09/27/2020 1400 Gross per 24 hour  Intake 3044.25 ml  Output 2945 ml  Net 99.25 ml   Filed Weights   09/25/20 0530 09/26/20 0319 09/27/20 0500  Weight: 87.1 kg 89.9 kg 90 kg   Pulmonary artery pulsatility index: 2  Examination: General: obese, chronically ill man intubated and sedated.  HENT: OGT.ETT in place.  Gastric contents draining Lungs: Throughout.  Apneic on SBT. Cardiovascular: Extremities cool, + rub + IABP, midline incision intact, minimal chest tube drainage.  Abdomen: protuberant, soft.  Midline scar..  Bowel sounds present Extremities: Generalized 3+ edema Neuro: sedated with minimal response to painful stimulation..  GU: foley in place with clear urine.   Labs/imaging that I havepersonally reviewed  (right click and "Reselect all SmartList Selections" daily)  Sodium has dropped 132 with creatinine of 2.98, consistent with volume overload. Hemoglobin has dropped to 8.6 consistent with hemodilution.  Platelet count has improved, now 80. Chest x-ray shows bilateral interstitial infiltrates consistent with pulmonary edema.  Chest tubes and balloon pump have been removed.  11/27/20 is in good position. KUB shows both small bore feeding tube and Levine tube tips in the stomach.  Large gastric bubble consistent with gastric ileus  Resolved Hospital Problem list   NSTEMI  Assessment & Plan:  Critically ill due to acute  hypoxic and hypercapnic respiratory failure requiring mechanical ventilation Critically ill due to RV dysfunction following ACS and CPB NSTEMI with culprit RCA involvement ( RV involvement?) Gastric ileus Thrombocytopenia proving Agitated delirium controlled Alcohol abuse. CAD  Plan:  -Maintain MAP greater than 70 to ensure adequate renal perfusion to encourage kidney recovery -Continue diuresis at  current rate. -Once in negative fluid balance, edema resolving and kidney function improving will likely be able to tolerate extubation. -Continue NG drainage. -Continue milrinone and epinephrine for RV support post extubation. -Given patient's agitation previously, continue current sedation regimen and commence SBT's once likely to succeed.  Best practice (right click and "Reselect all SmartList Selections" daily)  Diet:  NPO  Pain/Anxiety/Delirium protocol (if indicated): Yes (RASS goal -3) wean sedation once able to sit up post balloon pump removal VAP protocol (if indicated): Yes DVT prophylaxis: Contraindicated due to thrombocytopenia.  Initiate heparin for DVT prophylaxis once platelet count greater than 90.   GI prophylaxis: PPI Glucose control:  SSI Yes Central venous access:  Yes, and it is still needed Arterial line:  Yes, and it is still needed Foley:  Yes, and it is still needed Mobility:  bed rest  PT consulted: N/A Last date of multidisciplinary goals of care discussion [will update. ] Code Status:  full code Disposition: ICU  CRITICAL CARE Performed by: Lynnell Catalan  Total critical care time: 40 minutes  Critical care time was exclusive of separately billable procedures and treating other patients.  Critical care was necessary to treat or prevent imminent or life-threatening deterioration.  Critical care was time spent personally by me on the following activities: development of treatment plan with patient and/or surrogate as well as nursing, discussions with consultants, evaluation of patient's response to treatment, examination of patient, obtaining history from patient or surrogate, ordering and performing treatments and interventions, ordering and review of laboratory studies, ordering and review of radiographic studies, pulse oximetry, re-evaluation of patient's condition and participation in multidisciplinary rounds.  Lynnell Catalan, MD [facility] ICU Physician [facility]  [facility]  Pager: [phone] Mobile: [phone] After hours: [phone].

## 2020-09-27 NOTE — Progress Notes (Signed)
Patient ID: Jack Vance, male   DOB: 07-Apr-1941, 80 y.o.   MRN: [id number]  TCTS Evening Rounds:   Hemodynamically stable on milrinone 0.25, NE 7. Epi is off. CI = 4.7  Sedated on vent  Urine output good with lasix drip 10  > 200/hr. -1138 cc today so far. CT output low  CBC    Component Value Date/Time   WBC 10.9 (H) 09/27/2020 0928   RBC 3.16 (L) 09/27/2020 0928   HGB 8.6 (L) 09/27/2020 0928   HCT 25.7 (L) 09/27/2020 0928   PLT 80 (L) 09/27/2020 0928   MCV 81.3 09/27/2020 0928   MCH 27.2 09/27/2020 0928   MCHC 33.5 09/27/2020 0928   RDW 17.6 (H) 09/27/2020 0928   LYMPHSABS 0.6 (L) 09/23/2020 0311   MONOABS 1.3 (H) 09/23/2020 0311   EOSABS 0.0 09/23/2020 0311   BASOSABS 0.0 09/23/2020 0311     BMET    Component Value Date/Time   NA 132 (L) 09/27/2020 0518   K 3.9 09/27/2020 0518   CL 99 09/27/2020 0518   CO2 20 (L) 09/27/2020 0518   GLUCOSE 100 (H) 09/27/2020 0518   BUN 68 (H) 09/27/2020 0518   CREATININE 2.98 (H) 09/27/2020 0518   CALCIUM 7.3 (L) 09/27/2020 0518   GFRNONAA 21 (L) 09/27/2020 0518   GFRAA >60 12/08/2017 0510     A/P:  Stable multi-system organ dysfunction. Continue diuresis, vent support. Possible wake up and wean vent tomorrow.

## 2020-09-28 ENCOUNTER — Inpatient Hospital Stay (HOSPITAL_COMMUNITY): Payer: No Typology Code available for payment source

## 2020-09-28 DIAGNOSIS — Z951 Presence of aortocoronary bypass graft: Secondary | ICD-10-CM

## 2020-09-28 DIAGNOSIS — I5021 Acute systolic (congestive) heart failure: Secondary | ICD-10-CM

## 2020-09-28 DIAGNOSIS — I214 Non-ST elevation (NSTEMI) myocardial infarction: Principal | ICD-10-CM

## 2020-09-28 DIAGNOSIS — I10 Essential (primary) hypertension: Secondary | ICD-10-CM

## 2020-09-28 DIAGNOSIS — R140 Abdominal distension (gaseous): Secondary | ICD-10-CM

## 2020-09-28 DIAGNOSIS — I2101 ST elevation (STEMI) myocardial infarction involving left main coronary artery: Secondary | ICD-10-CM

## 2020-09-28 DIAGNOSIS — T17908D Unspecified foreign body in respiratory tract, part unspecified causing other injury, subsequent encounter: Secondary | ICD-10-CM

## 2020-09-28 DIAGNOSIS — N179 Acute kidney failure, unspecified: Secondary | ICD-10-CM

## 2020-09-28 DIAGNOSIS — K567 Ileus, unspecified: Secondary | ICD-10-CM

## 2020-09-28 LAB — CBC
HCT: 26.5 % — ABNORMAL LOW (ref 39.0–52.0)
Hemoglobin: 9.2 g/dL — ABNORMAL LOW (ref 13.0–17.0)
MCH: 27.3 pg (ref 26.0–34.0)
MCHC: 34.7 g/dL (ref 30.0–36.0)
MCV: 78.6 fL — ABNORMAL LOW (ref 80.0–100.0)
Platelets: 93 10*3/uL — ABNORMAL LOW (ref 150–400)
RBC: 3.37 MIL/uL — ABNORMAL LOW (ref 4.22–5.81)
RDW: 17.7 % — ABNORMAL HIGH (ref 11.5–15.5)
WBC: 9.5 10*3/uL (ref 4.0–10.5)
nRBC: 0.4 % — ABNORMAL HIGH (ref 0.0–0.2)

## 2020-09-28 LAB — CULTURE, BLOOD (ROUTINE X 2)
Culture: NO GROWTH
Culture: NO GROWTH

## 2020-09-28 LAB — GLUCOSE, CAPILLARY
Glucose-Capillary: 102 mg/dL — ABNORMAL HIGH (ref 70–99)
Glucose-Capillary: 106 mg/dL — ABNORMAL HIGH (ref 70–99)
Glucose-Capillary: 120 mg/dL — ABNORMAL HIGH (ref 70–99)
Glucose-Capillary: 82 mg/dL (ref 70–99)
Glucose-Capillary: 98 mg/dL (ref 70–99)
Glucose-Capillary: 98 mg/dL (ref 70–99)

## 2020-09-28 LAB — BASIC METABOLIC PANEL
Anion gap: 14 (ref 5–15)
Anion gap: 16 — ABNORMAL HIGH (ref 5–15)
BUN: 74 mg/dL — ABNORMAL HIGH (ref 8–23)
BUN: 75 mg/dL — ABNORMAL HIGH (ref 8–23)
CO2: 23 mmol/L (ref 22–32)
CO2: 22 mmol/L (ref 22–32)
Calcium: 7.8 mg/dL — ABNORMAL LOW (ref 8.9–10.3)
Calcium: 8 mg/dL — ABNORMAL LOW (ref 8.9–10.3)
Chloride: 101 mmol/L (ref 98–111)
Chloride: 101 mmol/L (ref 98–111)
Creatinine, Ser: 3.33 mg/dL — ABNORMAL HIGH (ref 0.61–1.24)
Creatinine, Ser: 3.37 mg/dL — ABNORMAL HIGH (ref 0.61–1.24)
GFR, Estimated: 18 mL/min — ABNORMAL LOW (ref >60)
GFR, Estimated: 18 mL/min — ABNORMAL LOW (ref >60)
Glucose, Bld: 96 mg/dL (ref 70–99)
Glucose, Bld: 114 mg/dL — ABNORMAL HIGH (ref 70–99)
Potassium: 3.1 mmol/L — ABNORMAL LOW (ref 3.5–5.1)
Potassium: 3.5 mmol/L (ref 3.5–5.1)
Sodium: 138 mmol/L (ref 135–145)
Sodium: 139 mmol/L (ref 135–145)

## 2020-09-28 LAB — COOXEMETRY PANEL
Carboxyhemoglobin: 1.1 % (ref 0.5–1.5)
Methemoglobin: 1.1 % (ref 0.0–1.5)
O2 Saturation: 70.9 %
Total hemoglobin: 11.8 g/dL — ABNORMAL LOW (ref 12.0–16.0)

## 2020-09-28 LAB — MAGNESIUM: Magnesium: 2.5 mg/dL — ABNORMAL HIGH (ref 1.7–2.4)

## 2020-09-28 MED ORDER — POTASSIUM CHLORIDE 10 MEQ/50ML IV SOLN
10.0000 meq | INTRAVENOUS | Status: AC
Start: 2020-09-28 — End: 2020-09-28
  Administered 2020-09-28 (×4): 10 meq via INTRAVENOUS
  Filled 2020-09-28: qty 50

## 2020-09-28 MED ORDER — NEOSTIGMINE METHYLSULFATE 10 MG/10ML IV SOLN
0.2500 mg | Freq: Four times a day (QID) | INTRAVENOUS | Status: DC
  Administered 2020-09-28 – 2020-10-06 (×31): 0.25 mg via SUBCUTANEOUS
  Filled 2020-09-28 (×35): qty 0.25

## 2020-09-28 MED ORDER — SODIUM CHLORIDE 0.9 % IV SOLN
2.2500 g | Freq: Three times a day (TID) | INTRAVENOUS | Status: DC
  Administered 2020-09-28 – 2020-09-30 (×6): 2.25 g via INTRAVENOUS
  Filled 2020-09-28 (×8): qty 10

## 2020-09-28 MED ORDER — POTASSIUM CHLORIDE 10 MEQ/100ML IV SOLN: 10.0000 meq | INTRAVENOUS | Status: DC

## 2020-09-28 MED ORDER — NEOSTIGMINE METHYLSULFATE 10 MG/10ML IV SOLN
1.0000 mg | Freq: Three times a day (TID) | INTRAVENOUS | Status: DC
  Filled 2020-09-28 (×2): qty 1

## 2020-09-28 MED ORDER — HEPARIN SODIUM (PORCINE) 5000 UNIT/ML IJ SOLN
5000.0000 [IU] | Freq: Three times a day (TID) | INTRAMUSCULAR | Status: DC
  Administered 2020-09-28 – 2020-10-06 (×24): 5000 [IU] via SUBCUTANEOUS
  Filled 2020-09-28 (×24): qty 1

## 2020-09-28 MED ORDER — SORBITOL 70 % SOLN
960.0000 mL | TOPICAL_OIL | Freq: Once | ORAL | Status: DC
  Filled 2020-09-28: qty 473

## 2020-09-28 MED ORDER — POTASSIUM CHLORIDE 10 MEQ/50ML IV SOLN
10.0000 meq | INTRAVENOUS | Status: AC
  Administered 2020-09-28 (×6): 10 meq via INTRAVENOUS
  Filled 2020-09-28: qty 50

## 2020-09-28 MED FILL — Mannitol IV Soln 20%: INTRAVENOUS | Qty: 500 | Status: AC

## 2020-09-28 MED FILL — Calcium Chloride Inj 10%: INTRAVENOUS | Qty: 10 | Status: AC

## 2020-09-28 MED FILL — Sodium Chloride IV Soln 0.9%: INTRAVENOUS | Qty: 2000 | Status: AC

## 2020-09-28 MED FILL — Electrolyte-R (PH 7.4) Solution: INTRAVENOUS | Qty: 5000 | Status: AC

## 2020-09-28 MED FILL — Heparin Sodium (Porcine) Inj 1000 Unit/ML: INTRAMUSCULAR | Qty: 10 | Status: AC

## 2020-09-28 MED FILL — Sodium Bicarbonate IV Soln 8.4%: INTRAVENOUS | Qty: 50 | Status: AC

## 2020-09-28 NOTE — Hospital Course (Addendum)
Referring: No ref. provider found Primary Care: [facility] Primary Cardiologist:None   Admitting Diagnoses      History of Present Illness:     80 year old male transferred from [facility] following an NSTEMI and cardiogenic shock.  Records are obtained from chart review and discussion with primary physicians.  He underwent a left heart cath which showed severe left main disease as well as ostial right coronary artery disease, and his echocardiogram shows reduced biventricular function.  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  An intra-aortic balloon pump was placed in the Cath Lab and the patient is being brought emergently to the OR for revascularization.  Course in Hospital:  Mr. Corniel was taken to the operating room urgently where three-vessel coronary bypass grafting was accomplished.  The left anterior mammary artery was grafted to the left anterior descending coronary artery.  Saphenous vein was grafted to the distal right and to the ramus coronary arteries.  Following the procedure, uneventfully.  Intra-aortic balloon pump support, which had been initiated prior to surgery, was continued postoperatively.  He was transferred to the surgical ICU in guarded condition on epinephrine and Levophed infusions.  He was followed closely in the ICU by the advanced heart failure team.  On postop day 1, he remained on mechanical ventilatory support.  He was hypotensive on postop day 1 and was started on norepinephrine and milrinone infusions.  He was noted to have significant right ventricular failure.  Decision was made to forego extubation until the following day.  On postop day 2, bedside echocardiography demonstrated ejection fraction of around 30% severe septal hypokinesis.  There was mildly decreased RV systolic function.  The intra-aortic balloon pump was gradually weaned and removed without event.  Chest  tube drainage tapered off and chest tubes were removed on postop day 2.  He developed atrial fibrillation on postop day 2 and was treated with amiodarone protocol.  He continued to require mechanical ventilatory support.  Chest x-ray showed pulmonary edema and bibasilar atelectasis.  Possibility of aspiration pneumonia was considered.  He was started on IV antibiotic coverage accordingly.  He developed acute kidney injury felt to be due to acute tubular necrosis.  Nutritional support was initiated on postop day 3 by way of a nasogastric Cortrak. feeding tube.  He developed abdominal distention over the next 24 hours with an episode of vomiting.  An NG tube was placed for gastric decompression.  Renal function worsened with creatinine rising to 3.  He was started on a Lasix drip and had good response.  Portable abdominal x-ray obtained on 09/28/2020 showed fluid-filled loops of small bowel raising the question of ileus or enteritis.  There was no radiographic evidence of obstruction.  Renal function and hemodynamics gradually improved over the next few days. Urine output was adequate.  He was weaned from vasopressor support.

## 2020-09-28 NOTE — Progress Notes (Addendum)
NAME:  Jack Vance, MRN:  [id number], DOB:  [birth year], LOS: 5 ADMISSION DATE:  09/23/2020, CONSULTATION DATE:  09/24/2019 REFERRING MD:  Jacinto Halim, CHIEF COMPLAINT:  Acute coronary syndrome   History of Present Illness:  80 year old man who presented with cardiogenic shock secondary to NSTEMI.  Presented with new onset RSCP and severe dyspnea. Found to have acute pulmonary edema and heart failure; intubated and sent for urgent LHC which revealed 3V CAD.  IABP inserted and patient underwent urgent CABG. IABP removed 5/9.  Pertinent  Medical History   Past Medical History:  Diagnosis Date  . Arthritis   . Asthma   . DDD (degenerative disc disease), lumbar   . Hernia of abdominal wall   . RLS (restless legs syndrome)    Significant Hospital Events: Including procedures, antibiotic start and stop dates in addition to other pertinent events   . 5/6 CABG with Dr Cliffton Asters with LIMA to LAD, SVG to RCA d and SVG to ramus. LV normal and returned to ICU with no vasopressor support.  . No post-operative bleeding but poor RV performance on hemodynamics.  . 5/8 significant hemodynamic instability with labile blood pressure and vasopressor requirements yesterday.  Episode of emesis but no evidence of bowel obstruction.  Evidence of pericardial tamponade. . 5/9 improved hemodynamic, able now to tolerate balloon pump weaning and removal. . 5/11 Stable hemodynamics, continuing diuresis. Improved UOP. TPN consult for initiation 5/12 in the setting of ileus, prolonged NPO status.  Interim History / Subjective:  Vomited x 1 overnight with turning, trickle TFs discontinued Increasing UOP over the last 48H (5.3L last 24H) on Lasix gtt Did not tolerate trickle TFs, ongoing ileus  Objective   Blood pressure (!) 111/57, pulse 93, temperature (!) 100.4 F (38 C), resp. rate (!) 22, height 5\' 6"  (1.676 m), weight 88.4 kg, SpO2 100 %. PAP: (31-40)/(14-22) 31/18 CVP:  [5 mmHg-13 mmHg] 10 mmHg CO:  [6.3  L/min] 6.3 L/min CI:  [3.2 L/min/m2] 3.2 L/min/m2  Vent Mode: PRVC FiO2 (%):  [40 %-60 %] 60 % Set Rate:  [22 bmp] 22 bmp Vt Set:  [560 mL] 560 mL PEEP:  [5 cmH20] 5 cmH20 Plateau Pressure:  [21 cmH20-26 cmH20] 22 cmH20   Intake/Output Summary (Last 24 hours) at 09/28/2020 0924 Last data filed at 09/28/2020 0853 Gross per 24 hour  Intake 1759.84 ml  Output 7375 ml  Net -5615.16 ml   Filed Weights   09/26/20 0319 09/27/20 0500 09/28/20 0500  Weight: 89.9 kg 90 kg 88.4 kg   Pulmonary artery pulsatility index: 1.2  Physical Examination: General: Chronically ill-appearing elderly man in NAD. HEENT: [place]/AT, anicteric sclera, PERRL (22mm), moist mucous membranes. ETT in place. Neuro: Sedated. Withdraws minimally to pain. Not following commands. +Corneal, +Cough and +Gag  CV: Regular rhythm with PVCs. Midline sternotomy with dressing intact. PULM: Breathing even and unlabored on vent (PEEP 5, FiO2 60%). Lung fields coarse throughout. GI: Obese, soft but protuberant. Mildly distended. Midline prior incision. Large ventral hernia. Hypoactive bowel sounds. Extremities: Bilateral symmetric 2+ pitting LE edema noted. Swan to R groin. Skin: Warm/dry, no rashes noted.  Labs/imaging that I have personally reviewed: (right click and "Reselect all SmartList Selections" daily)  WBC 9.5 (10.9) H&H stable. 9.2/26.5 (8.6/25.7) Plt 93  Na 138, K 3.1 (3.5, on Lasix gtt) - supplementing, CO2 23 BUN 74, Cr 3.33 (slow uptrend) Mg 2.5  Co-ox 70.9 (77.1 5/10)  CXR with small bilateral effusions, bibasilar atelectasis, cardiomegaly with congestion  Resolved Hospital Problem  List   NSTEMI  Assessment & Plan:  Critically ill due to acute hypoxic and hypercapnic respiratory failure requiring mechanical ventilation Possible aspiration - Extubation currently precluded by gross volume overload (improving) - Continue full vent support (6-8cc/kg IBW) - Wean FiO2 for O2 sat > 90% - Daily WUA/SBT when  able to wean sedation - VAP bundle - Pulmonary hygiene - PAD protocol for sedation: Precedex and Fentanyl for goal RASS -1 to -2  - Continue Zosyn for ?aspiration  Critically ill due to RV dysfunction following ACS and CPB Cardiogenic shock NSTEMI with culprit RCA involvement ( RV involvement?) Coronary artery disease Echo 5/6 pre-op with LVEF 20-25%, severe septal hypokinesis. S/p IABP placement, 3V-CABG. IABP removed 5/9. Goal remains diuresis with optimization of volume status for extubation and further cardiac recovery. - Appreciate management of TCTS/HF teams - Continue Lasix gtt for ongoing diuresis today, 5/11 - Goal extubation tomorrow, 5/12 pending volume status/SBT trial toleration - Remains on amiodarone gtt - Continue milrinone and norepinephrine for RV support post-extubation  Acute kidney injury Likely multifactorial; favor primarily prerenal in the setting of hemodynamic insults, cardiogenic shock, aggressive diuresis for volume management, medications. Baseline Cr ~1.0.  - Trend BMP, monitoring closely with aggressive diuresis - Replete electrolytes as indicated - Monitor I&Os - Avoid nephrotoxic agents as able - Ensure adequate renal perfusion, goal MAP > 70  Gastric ileus Increasing abdominal distention and hypoactive bowel sounds. Did not tolerate trickle TFs. - Continue NGT to suction - TPN Consult today, 5/11 - Plan for initiation at 1/2 dose 5/12  Thrombocytopenia, improving - Continue to trend Plt count - Initiate SQH today, given Plt > 90  Agitated delirium controlled Alcohol abuse - Delirium precautions  Best Practice: (right click and "Reselect all SmartList Selections" daily)  Diet:  NPO, TPN consult placed Pain/Anxiety/Delirium protocol (if indicated): Yes (RASS goal -1,-2) wean sedation as able post-IABP removal VAP protocol (if indicated): Yes DVT prophylaxis: Subcutaneous Heparin, Plt count > 90  GI prophylaxis: PPI Glucose control:  SSI  Yes Central venous access:  Yes, and it is still needed Arterial line:  Yes, and it is still needed Foley:  Yes, and it is still needed Mobility:  bed rest  PT consulted: N/A Last date of multidisciplinary goals of care discussion [Per Primary] Code Status:  full code Disposition: ICU  Critical care time: 35 minutes   Tim Lair, PA-C [facility] 09/28/20 9:24 AM  Please see [email or web address] for pager details.  From 7A-7P if no response, please call [phone] After hours, please call ELink [phone]

## 2020-09-28 NOTE — Progress Notes (Addendum)
Advanced Heart Failure Rounding Note   Subjective:    Underwent emergent CABG on 5/6 for critical LM disease (LIMA -> LAD, SVG -> OM, SVG -> RCA)  5/8 Possible aspiration. A fib --> started amio drip.  Given 2u PRBCs. Hypotensive in the evening and started on vasopressin + antibiotics.  5/9 IABP removed 5/10 NG placed >1.5 out   Sedated on vent. FiO2 40% .   Currently on milrinone 0.25, Norepi  9, CO-OX 71%.   On asix drip at 10 with brisk diuresis. Negative 5 liters.    Creatinine 1.83 => 1.95=>2.3 =>3=> 3.3   Vomiting overnight during turn.    Swan numbers: CVP 12-13 PA 35/19  CO 6.3 CI 3.81 PAPi 1.2  Objective:   Weight Range:  Vital Signs:   Temp:  [99.14 F (37.3 C)-100.4 F (38 C)] 100.04 F (37.8 C) (05/11 0600) Pulse Rate:  [93-97] 97 (05/10 1525) Resp:  [0-24] 22 (05/11 0600) BP: (116-152)/(47-65) 128/63 (05/11 0600) SpO2:  [92 %-100 %] 92 % (05/11 0600) Arterial Line BP: (134-190)/(46-83) 187/83 (05/11 0600) FiO2 (%):  [40 %] 40 % (05/11 0600) Weight:  [88.4 kg] 88.4 kg (05/11 0500)    Weight change: Filed Weights   09/26/20 0319 09/27/20 0500 09/28/20 0500  Weight: 89.9 kg 90 kg 88.4 kg    Intake/Output:   Intake/Output Summary (Last 24 hours) at 09/28/2020 0706 Last data filed at 09/28/2020 0553 Gross per 24 hour  Intake 1759.84 ml  Output 6775 ml  Net -5015.16 ml    CVP 12-13  General: Intubated/sedated  HEENT: ETT NG with bilious output. + Cortrack  Neck: supple. Difficult to assess. Carotids 2+ bilat; no bruits. No lymphadenopathy or thryomegaly appreciated. Cor: PMI nondisplaced. Irregular rate & rhythm. No rubs, gallops or murmurs. Lungs: Coarse throughout  Abdomen: soft, nontender, ventral hernia. No hepatosplenomegaly. No bruits or masses. Good bowel sounds. Extremities: no cyanosis, clubbing, rash, R and LLE 2+ edema. R groin swan  Neuro: Sedated on vent  GU: Foley   Telemetry: SR with frequent PVCs.  Labs: Basic  Metabolic Panel: Recent Labs  Lab 09/24/20 1516 09/24/20 2002 09/25/20 0355 09/25/20 0402 09/25/20 1609 09/25/20 1739 09/26/20 0413 09/26/20 0539 09/27/20 0518 09/27/20 1720 09/28/20 0542  NA 135   < > 135   < > 132*   < > 132* 134* 132* 135 138  K 3.8   < > 3.3*   < > 3.9   < > 3.8 3.8 3.9 3.5 3.1*  CL 107  --  105  --  103  --  101  --  99 98 101  CO2 21*  --  21*  --  18*  --  19*  --  20* 21* 23  GLUCOSE 141*  --  141*  --  217*  --  201*  --  100* 96 96  BUN 43*  --  49*  --  55*  --  56*  --  68* 73* 74*  CREATININE 1.83*  --  1.95*  --  2.34*  --  2.30*  --  2.98* 3.22* 3.33*  CALCIUM 7.9*  --  7.5*  --  7.2*  --  7.1*  --  7.3* 7.4* 7.8*  MG 3.3*  --  3.1*  --  2.7*  --  2.5*  --  2.4 2.4 2.5*  PHOS 5.1*  --  4.4  --  3.5  --   --   --   --   --   --    < > =  values in this interval not displayed.    Liver Function Tests: Recent Labs  Lab 09/23/20 0311 09/26/20 0413  AST 131* 54*  ALT 94* 9  ALKPHOS 77 49  BILITOT 0.9 0.6  PROT 7.2 4.5*  ALBUMIN 3.5 1.9*   No results for input(s): LIPASE, AMYLASE in the last 168 hours. No results for input(s): AMMONIA in the last 168 hours.  CBC: Recent Labs  Lab 09/23/20 0311 09/23/20 0859 09/25/20 0355 09/25/20 0402 09/25/20 2009 09/25/20 2230 09/26/20 0248 09/26/20 0413 09/26/20 0539 09/27/20 0928 09/28/20 0542  WBC 14.5*   < > 10.2  --  7.6  --   --  5.4  --  10.9* 9.5  NEUTROABS 12.5*  --   --   --   --   --   --   --   --   --   --   HGB 7.5*   < > 7.8*   < > 10.8*  11.6*   < > 9.9* 10.2* 10.5* 8.6* 9.2*  HCT 24.7*   < > 24.7*   < > 34.0*  34.0*   < > 29.0* 30.8* 31.0* 25.7* 26.5*  MCV 80.7   < > 84.9  --  84.8  --   --  82.6  --  81.3 78.6*  PLT 269   < > 119*  --  83*  --   --  61*  --  80* 93*   < > = values in this interval not displayed.    Cardiac Enzymes: Recent Labs  Lab 09/23/20 0554  CKTOTAL 455*  CKMB 65.8*    BNP: BNP (last 3 results) Recent Labs    09/23/20 0311  BNP 1,191.8*     ProBNP (last 3 results) No results for input(s): PROBNP in the last 8760 hours.    Other results:  Imaging: DG Chest 1 View  Result Date: 09/27/2020 CLINICAL DATA:  ET tube EXAM: CHEST  1 VIEW COMPARISON:  09/26/2020 FINDINGS: Support devices remain in place, unchanged. Cardiomegaly, vascular congestion. Bilateral airspace disease similar to prior study. No visible significant effusions or pneumothorax. IMPRESSION: Stable support devices. Bilateral airspace disease could reflect edema or infection. Electronically Signed   By: Rolm Baptise M.D.   On: 09/27/2020 10:00   DG Abd 1 View  Result Date: 09/27/2020 CLINICAL DATA:  Ileus EXAM: ABDOMEN - 1 VIEW COMPARISON:  09/26/2020 FINDINGS: Gas-filled, nondistended loops of small bowel in the central abdomen measuring up to 3.5 cm in caliber. Non weighted enteric feeding tube projects with tip below the diaphragm in the vicinity of the pylorus. No obvious free air on single supine radiograph which incompletely covers the abdomen. IMPRESSION: Gas-filled, nondistended loops of small bowel in the central abdomen measuring up to 3.5 cm in caliber. No overt evidence of ileus or bowel obstruction. Electronically Signed   By: Eddie Candle M.D.   On: 09/27/2020 08:36   DG Abd Portable 1V  Result Date: 09/27/2020 CLINICAL DATA:  OG tube placement EXAM: PORTABLE ABDOMEN - 1 VIEW COMPARISON:  09/27/2020 FINDINGS: Enteric tube is in place with the tip in the stomach. Feeding tube tip is in the right upper quadrant which could be within the distal stomach or proximal duodenum. IMPRESSION: Enteric tube and feeding tube position as above. Electronically Signed   By: Rolm Baptise M.D.   On: 09/27/2020 10:00   DG Abd Portable 1V  Result Date: 09/26/2020 CLINICAL DATA:  Feeding tube placement EXAM: PORTABLE ABDOMEN - 1 VIEW  COMPARISON:  Portable exam 1046 hours compared to 0523 hours FINDINGS: Rotated exam. Tip of feeding tube projects over descending duodenum.  Bibasilar lung opacities and questionable small RIGHT pleural effusion. Swan-Ganz catheter projects over RIGHT perihilar region. Visualized bowel gas pattern unremarkable. IMPRESSION: Tip of feeding tube projects over descending duodenum. Electronically Signed   By: Lavonia Dana M.D.   On: 09/26/2020 11:04     Medications:     Scheduled Medications: . sodium chloride   Intravenous Once  . acetaminophen (TYLENOL) oral liquid 160 mg/5 mL  650 mg Per Tube Once   Or  . acetaminophen  650 mg Rectal Once  . aspirin  300 mg Rectal Daily  . bisacodyl  10 mg Rectal Daily  . chlorhexidine gluconate (MEDLINE KIT)  15 mL Mouth Rinse BID  . Chlorhexidine Gluconate Cloth  6 each Topical Daily  . insulin aspart  0-24 Units Subcutaneous Q4H  . insulin detemir  8 Units Subcutaneous BID  . ipratropium-albuterol  3 mL Nebulization Q6H  . mouth rinse  15 mL Mouth Rinse 10 times per day  . metoCLOPramide (REGLAN) injection  5 mg Intravenous Q6H  . pantoprazole (PROTONIX) IV  40 mg Intravenous Q24H  . sodium chloride flush  10-40 mL Intracatheter Q12H  . sodium chloride flush  3 mL Intravenous Q12H  . sodium phosphate  1 enema Rectal Once    Infusions: . sodium chloride Stopped (09/26/20 1340)  . sodium chloride    . sodium chloride 20 mL/hr at 09/27/20 1900  . amiodarone 60 mg/hr (09/28/20 0400)  . dexmedetomidine (PRECEDEX) IV infusion 0.7 mcg/kg/hr (09/28/20 0530)  . fentaNYL infusion INTRAVENOUS 120 mcg/hr (09/28/20 0400)  . furosemide (LASIX) 200 mg in dextrose 5% 100 mL ($Remov'2mg'dONIMk$ /mL) infusion 10 mg/hr (09/28/20 0400)  . insulin 1.9 Units/hr (09/25/20 0600)  . lactated ringers 20 mL/hr at 09/27/20 1900  . lactated ringers 20 mL/hr at 09/27/20 1900  . midazolam 1 mg/hr (09/28/20 0400)  . milrinone 0.25 mcg/kg/min (09/28/20 0400)  . norepinephrine (LEVOPHED) Adult infusion 12 mcg/min (09/28/20 0217)  . piperacillin-tazobactam (ZOSYN)  IV 3.375 g (09/28/20 0553)    PRN Medications: sodium  chloride, acetaminophen, albuterol, dextrose, fentaNYL (SUBLIMAZE) injection, metoprolol tartrate, midazolam, ondansetron (ZOFRAN) IV, sodium chloride flush, sodium chloride flush   Assessment/Plan:   1. 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  - hemodynamics ok. CO-OX stable.  - Slowly wean norepi to maintain Map >70 -  milrinone 0.25 + norepi 9  - Remains volume overloaded. Had brisk diuresis. Continue lasix drip.   2. NSTEMI with critical CAD - 95% LM and high grade ostial RCA - CABG on 5/6 for critical LM disease (LIMA -> LAD, SVG -> OM, SVG -> RCA) - Continue ASA/statin  - Consider Plavix now that IABP out but abdominal distention may need to wait.  - See above. Hgb stable at 9.2   3. Acute hypoxic respiratory failure due to pulmonary edema - on vent - Possible aspiration 5/8 & 5/10  - 5/6Blood CX - NG - On antibiotics  - CCM following  4. AKI  - due to ATN - Cr 1.0 -> 1.7 -> 1.95-->2.3 ->3->3.3 - Brisk diuresis noted.  - Support hemodynamics.   5. Ventral Hernia  5/10 NG placed to decompress another 800 out over night.    6. ID  On antibiotic for possible aspiration.  Blood CX 5/6- NGTD TMax 100.4.  WBC 9.5   May be able to remove swan.   Length of Stay: 5   Amy Clegg NP-C  09/28/2020, 7:06 AM  Advanced Heart Failure Team Pager [phone] (M-F; 7a - 4p)  Please contact [facility] for night-coverage after hours (4p -7a ) and weekends on [email or web address]  Agree with above.   Remains intubated. On NE and milrinone. Co-ox looks good. Good diuresis overnight. Renal function starting to level off.   Still struggling with ileus and vomitting  General:  Intubated/sedated HEENT: normal + ETT Neck: supple. no JVD. Carotids 2+ bilat; no bruits. No lymphadenopathy or thryomegaly  appreciated. Cor:  + sternal dressing and CTsRegular rate & rhythm. No rubs, gallops or murmurs. Lungs: coarse Abdomen: soft, nontender, +distended  Large ventral hernia  bowel sounds. Extremities: no cyanosis, clubbing, rash, 2+ edema Neuro: intubated/sedated  Remains very tenuous. Hemodynamics improved but PAPi still marginal. Continue IV diuresis. It appears AKI is plateauing. Need to aggressively rx ileus prior to extubation.Co ntinue abx.   CRITICAL CARE Performed by: Glori Bickers  Total critical care time: 35 minutes  Critical care time was exclusive of separately billable procedures and treating other patients.  Critical care was necessary to treat or prevent imminent or life-threatening deterioration.  Critical care was time spent personally by me (independent of midlevel providers or residents) on the following activities: development of treatment plan with patient and/or surrogate as well as nursing, discussions with consultants, evaluation of patient's response to treatment, examination of patient, obtaining history from patient or surrogate, ordering and performing treatments and interventions, ordering and review of laboratory studies, ordering and review of radiographic studies, pulse oximetry and re-evaluation of patient's condition.  Glori Bickers, MD  8:35 AM

## 2020-09-28 NOTE — Progress Notes (Signed)
Pharmacy Antibiotic Note  Jack Vance is a 80 y.o. male admitted on 09/23/2020 with mvCAD. Pharmacy has been consulted for Zosyn dosing with concerns for aspiration PNA.  Scr rising this AM.  Est CrCl ~ 19 ml/min  Zosyn 5/8 >   5/6 Bcx x2: ngtd    Plan: Adjust Zosyn to 2.25g IV q 8 hrs. Watch Cr closely  Height: 5\' 6"  (167.6 cm) Weight: 88.4 kg (194 lb 14.2 oz) IBW/kg (Calculated) : 63.8  Temp (24hrs), Avg:99.8 F (37.7 C), Min:99.14 F (37.3 C), Max:100.4 F (38 C)  Recent Labs  Lab 09/25/20 0355 09/25/20 0621 09/25/20 1609 09/25/20 1731 09/25/20 2009 09/25/20 2329 09/26/20 0413 09/26/20 0810 09/27/20 0518 09/27/20 0928 09/27/20 1720 09/28/20 0542  WBC 10.2  --   --   --  7.6  --  5.4  --   --  10.9*  --  9.5  CREATININE 1.95*  --  2.34*  --   --   --  2.30*  --  2.98*  --  3.22* 3.33*  LATICACIDVEN  --    < >  --  3.1* 4.0* 3.5* 2.8* 2.4*  --   --   --   --    < > = values in this interval not displayed.    Estimated Creatinine Clearance: 19 mL/min (A) (by C-G formula based on SCr of 3.33 mg/dL (H)).    Allergies  Allergen Reactions  . Cheese     Causes asthma  . Morphine And Related Rash   11/28/20, Reece Leader, [facility] Clinical Pharmacist  09/28/2020 7:47 AM   [facility] pharmacy phone numbers are listed on [email or web address]

## 2020-09-28 NOTE — Progress Notes (Signed)
      [address]       [place] [address]             [phone]      Intubated, sedated  BP (!) 105/53   Pulse 89   Temp (!) 101.3 F (38.5 C)   Resp (!) 22   Ht 5\' 6"  (1.676 m)   Wt 88.4 kg   SpO2 96%   BMI 31.46 kg/m   Intake/Output Summary (Last 24 hours) at 09/28/2020 1907 Last data filed at 09/28/2020 1817 Gross per 24 hour  Intake 3069.82 ml  Output 7125 ml  Net -4055.18 ml   K= 3.5, creatinine 3.37(stable)  Continue current Rx  Adama Ferber C. 11/28/2020, MD [facility] [phone]

## 2020-09-28 NOTE — Progress Notes (Addendum)
Cortrak Tube Team Note:  RD advanced tube via left nare to the 109 cm marking. Secured with existing bridle.   X-ray is required to confirm post pyloric placement, abdominal x-ray has been ordered by the Cortrak team. Please confirm tube placement before using the Cortrak tube.   If the tube becomes dislodged please keep the tube and contact the Cortrak team at [email or web address] ([email or web address]) for replacement.  If after hours and replacement cannot be delayed, place a NG tube and confirm placement with an abdominal x-ray.   Vanessa Kick RD, LDN Clinical Nutrition Pager listed in AMION

## 2020-09-28 NOTE — Progress Notes (Signed)
[address]       [place] [address]             [phone]                 5 Days Post-Op Procedure(s) (LRB): CORONARY ARTERY BYPASS GRAFTING (CABG)  TIMES 4 USING LEFT GREATER SAPHENOUS VEIN HARVESTED ENDOSCOPICALLY AND LEFT INTERNAL MAMMARY ARTERY (N/A) TRANSESOPHAGEAL ECHOCARDIOGRAM (TEE) (N/A)   Events: No events Made good urine overnight _______________________________________________________________ Vitals: BP 122/67   Pulse 89   Temp 99.68 F (37.6 C)   Resp (!) 22   Ht 5\' 6"  (1.676 m)   Wt 88.4 kg   SpO2 100%   BMI 31.46 kg/m   - Neuro: Sedated.  Precedex at 0.7, fentanyl at 125, Versed 1.5  - Cardiovascular: Normal sinus rhythm.  I  Drips:  milrinone at 0.25, Levophed at 4 Lasix at 8 PAP: (30-40)/(14-24) 34/19 CVP:  [5 mmHg-18 mmHg] 11 mmHg CO:  [6.3 L/min] 6.3 L/min CI:  [3.2 L/min/m2] 3.2 L/min/m2  - Pulm:  Vent Mode: PRVC FiO2 (%):  [40 %-60 %] 50 % Set Rate:  [22 bmp] 22 bmp Vt Set:  [560 mL] 560 mL PEEP:  [5 cmH20] 5 cmH20 Plateau Pressure:  [21 cmH20-26 cmH20] 21 cmH20  ABG    Component Value Date/Time   PHART 7.410 09/26/2020 0539   PCO2ART 31.1 (L) 09/26/2020 0539   PO2ART 136 (H) 09/26/2020 0539   HCO3 19.9 (L) 09/26/2020 0539   TCO2 21 (L) 09/26/2020 0539   ACIDBASEDEF 4.0 (H) 09/26/2020 0539   O2SAT 70.9 09/28/2020 0542    - Abd: Distended, ventral hernia  - Extremity: Warm  .Intake/Output      05/10 0701 05/11 0700 05/11 0701 05/12 0700   I.V. (mL/kg) 1583.2 (17.9) 2050.3 (23.2)   NG/GT 0    IV Piggyback 176.6 170.8   Total Intake(mL/kg) 1759.8 (19.9) 2221.1 (25.1)   Urine (mL/kg/hr) 5325 (2.5) 1625 (3)   Emesis/NG output 1450 800   Total Output 6775 2425   Net -5015.2 -203.9           _______________________________________________________________ Labs: CBC Latest Ref Rng & Units 09/28/2020 09/27/2020 09/26/2020  WBC 4.0 - 10.5 K/uL 9.5 10.9(H) -  Hemoglobin 13.0 - 17.0 g/dL 11/26/2020) 1.3(Y)  10.5(L)  Hematocrit 39.0 - 52.0 % 26.5(L) 25.7(L) 31.0(L)  Platelets 150 - 400 K/uL 93(L) 80(L) -   CMP Latest Ref Rng & Units 09/28/2020 09/27/2020 09/27/2020  Glucose 70 - 99 mg/dL 96 96 11/27/2020)  BUN 8 - 23 mg/dL 784(O) 96(E) 95(M)  Creatinine 0.61 - 1.24 mg/dL 84(X) 3.24(M) 0.10(U)  Sodium 135 - 145 mmol/L 138 135 132(L)  Potassium 3.5 - 5.1 mmol/L 3.1(L) 3.5 3.9  Chloride 98 - 111 mmol/L 101 98 99  CO2 22 - 32 mmol/L 23 21(L) 20(L)  Calcium 8.9 - 10.3 mg/dL 7.8(L) 7.4(L) 7.3(L)  Total Protein 6.5 - 8.1 g/dL - - -  Total Bilirubin 0.3 - 1.2 mg/dL - - -  Alkaline Phos 38 - 126 U/L - - -  AST 15 - 41 U/L - - -  ALT 0 - 44 U/L - - -    CXR: Pulmonary vascular congestion with pleural effusions.  _______________________________________________________________  Assessment and Plan: POD 5 s/p emergency CABG.  Neuro: Wean sedation as tolerated. CV: weaning support as tolerated. Pulm: Wean vent as tolerated.  Patient still appears volume overloaded.  Good respiratory mechanics. Renal: stableTolerating diuresis GI: Tube  feeds on hold.  Ileus, continue gastric decompression.  Will start TPN tomorrow Heme: Stable ID: On Zosyn. Endo: Sliding scale insulin. Dispo: Continue ICU care.   Jack Vance 09/28/2020 1:07 PM

## 2020-09-29 ENCOUNTER — Inpatient Hospital Stay (HOSPITAL_COMMUNITY): Payer: No Typology Code available for payment source

## 2020-09-29 DIAGNOSIS — M7989 Other specified soft tissue disorders: Secondary | ICD-10-CM

## 2020-09-29 DIAGNOSIS — R0603 Acute respiratory distress: Secondary | ICD-10-CM | POA: Diagnosis not present

## 2020-09-29 LAB — GLUCOSE, CAPILLARY
Glucose-Capillary: 110 mg/dL — ABNORMAL HIGH (ref 70–99)
Glucose-Capillary: 117 mg/dL — ABNORMAL HIGH (ref 70–99)
Glucose-Capillary: 123 mg/dL — ABNORMAL HIGH (ref 70–99)
Glucose-Capillary: 126 mg/dL — ABNORMAL HIGH (ref 70–99)
Glucose-Capillary: 141 mg/dL — ABNORMAL HIGH (ref 70–99)
Glucose-Capillary: 153 mg/dL — ABNORMAL HIGH (ref 70–99)
Glucose-Capillary: 166 mg/dL — ABNORMAL HIGH (ref 70–99)

## 2020-09-29 LAB — COMPREHENSIVE METABOLIC PANEL
ALT: 9 U/L (ref 0–44)
AST: 29 U/L (ref 15–41)
Albumin: 1.6 g/dL — ABNORMAL LOW (ref 3.5–5.0)
Alkaline Phosphatase: 114 U/L (ref 38–126)
Anion gap: 18 — ABNORMAL HIGH (ref 5–15)
BUN: 77 mg/dL — ABNORMAL HIGH (ref 8–23)
CO2: 23 mmol/L (ref 22–32)
Calcium: 7.9 mg/dL — ABNORMAL LOW (ref 8.9–10.3)
Chloride: 99 mmol/L (ref 98–111)
Creatinine, Ser: 3.46 mg/dL — ABNORMAL HIGH (ref 0.61–1.24)
GFR, Estimated: 17 mL/min — ABNORMAL LOW (ref >60)
Glucose, Bld: 143 mg/dL — ABNORMAL HIGH (ref 70–99)
Potassium: 3.2 mmol/L — ABNORMAL LOW (ref 3.5–5.1)
Sodium: 140 mmol/L (ref 135–145)
Total Bilirubin: 1.1 mg/dL (ref 0.3–1.2)
Total Protein: 5.1 g/dL — ABNORMAL LOW (ref 6.5–8.1)

## 2020-09-29 LAB — CBC
HCT: 27.9 % — ABNORMAL LOW (ref 39.0–52.0)
Hemoglobin: 9.2 g/dL — ABNORMAL LOW (ref 13.0–17.0)
MCH: 27 pg (ref 26.0–34.0)
MCHC: 33 g/dL (ref 30.0–36.0)
MCV: 81.8 fL (ref 80.0–100.0)
Platelets: 95 10*3/uL — ABNORMAL LOW (ref 150–400)
RBC: 3.41 MIL/uL — ABNORMAL LOW (ref 4.22–5.81)
RDW: 18.1 % — ABNORMAL HIGH (ref 11.5–15.5)
WBC: 11.1 10*3/uL — ABNORMAL HIGH (ref 4.0–10.5)
nRBC: 0.4 % — ABNORMAL HIGH (ref 0.0–0.2)

## 2020-09-29 LAB — PHOSPHORUS: Phosphorus: 6.7 mg/dL — ABNORMAL HIGH (ref 2.5–4.6)

## 2020-09-29 LAB — COOXEMETRY PANEL
Carboxyhemoglobin: 1.2 % (ref 0.5–1.5)
Methemoglobin: 1.4 % (ref 0.0–1.5)
O2 Saturation: 87.9 %
Total hemoglobin: 9.4 g/dL — ABNORMAL LOW (ref 12.0–16.0)

## 2020-09-29 LAB — MAGNESIUM: Magnesium: 2.2 mg/dL (ref 1.7–2.4)

## 2020-09-29 MED ORDER — POTASSIUM CHLORIDE 10 MEQ/50ML IV SOLN
10.0000 meq | INTRAVENOUS | Status: AC
  Administered 2020-09-29 (×6): 10 meq via INTRAVENOUS
  Filled 2020-09-29 (×6): qty 50

## 2020-09-29 MED ORDER — THIAMINE HCL 100 MG/ML IJ SOLN
INTRAMUSCULAR | Status: AC
  Filled 2020-09-29: qty 648

## 2020-09-29 MED ORDER — FREE WATER
30.0000 mL | Status: DC
  Administered 2020-09-29 – 2020-10-04 (×28): 30 mL

## 2020-09-29 MED ORDER — SORBITOL 70 % SOLN
960.0000 mL | TOPICAL_OIL | Freq: Once | ORAL | Status: AC
  Administered 2020-09-29: 960 mL via RECTAL
  Filled 2020-09-29: qty 473

## 2020-09-29 MED ORDER — MAGNESIUM SULFATE 2 GM/50ML IV SOLN
2.0000 g | Freq: Once | INTRAVENOUS | Status: AC
  Administered 2020-09-29: 2 g via INTRAVENOUS
  Filled 2020-09-29: qty 50

## 2020-09-29 NOTE — Progress Notes (Addendum)
Advanced Heart Failure Rounding Note   Subjective:    Underwent emergent CABG on 5/6 for critical LM disease (LIMA -> LAD, SVG -> OM, SVG -> RCA) 5/6 Cultures negative.  5/8 Possible aspiration. A fib --> started amio drip.  Given 2u PRBCs. Hypotensive in the evening and started on vasopressin + antibiotics.  5/9 IABP removed 5/10 NG placed >1.5 out  5/11 started neostigmime 5/12 Febrile 102  Sedated on vent. FiO2 40% . No BM   Currently on milrinone 0.25, Norepi  8 CO-OX 71%.   On lasix drip at 8. Negative 3.3 liters.    Creatinine 1.83 => 1.95=>2.3 =>3=> 3.3   Swan numbers: CVP 8 PA 28/18  CO 6.8 CI 3.4  Papi 1.2   Objective:   Weight Range:  Vital Signs:   Temp:  [99.68 F (37.6 C)-102.02 F (38.9 C)] 101.66 F (38.7 C) (05/12 0600) Pulse Rate:  [87-93] 87 (05/11 1955) Resp:  [15-22] 22 (05/12 0600) BP: (94-160)/(40-112) 137/56 (05/12 0600) SpO2:  [90 %-100 %] 96 % (05/12 0600) Arterial Line BP: (115-194)/(43-84) 174/48 (05/12 0600) FiO2 (%):  [40 %-60 %] 40 % (05/12 0600) Weight:  [85.8 kg] 85.8 kg (05/12 0615)    Weight change: Filed Weights   09/27/20 0500 09/28/20 0500 09/29/20 0615  Weight: 90 kg 88.4 kg 85.8 kg    Intake/Output:   Intake/Output Summary (Last 24 hours) at 09/29/2020 0716 Last data filed at 09/29/2020 0616 Gross per 24 hour  Intake 3247.25 ml  Output 6550 ml  Net -3302.75 ml    CVP 8 General:  Intubated/sedated HEENT: ETT Neck: supple. JVP difficult to assess. Carotids 2+ bilat; no bruits. No lymphadenopathy or thryomegaly appreciated. Cor: PMI nondisplaced. Regular rate & rhythm. No rubs, gallops or murmurs. Lungs: Rhoncihi throughout  Abdomen: soft, nontender, hernia distended. No hepatosplenomegaly. No bruits or masses. Good bowel sounds. Extremities: no cyanosis, clubbing, rash, R and LLE 2+ edema. R groin swan Neuro: Intubated/sedated  GU: Foley   Telemetry: SR with frequent PVCs.  Labs: Basic Metabolic  Panel: Recent Labs  Lab 09/24/20 1516 09/24/20 2002 09/25/20 0355 09/25/20 0402 09/25/20 1609 09/25/20 1739 09/26/20 0413 09/26/20 0539 09/27/20 0518 09/27/20 1720 09/28/20 0542 09/28/20 1656 09/29/20 0602  NA 135   < > 135   < > 132*   < > 132*   < > 132* 135 138 139 140  K 3.8   < > 3.3*   < > 3.9   < > 3.8   < > 3.9 3.5 3.1* 3.5 3.2*  CL 107  --  105  --  103  --  101  --  99 98 101 101 99  CO2 21*  --  21*  --  18*  --  19*  --  20* 21* $Remov'23 22 23  'KCdtah$ GLUCOSE 141*  --  141*  --  217*  --  201*  --  100* 96 96 114* 143*  BUN 43*  --  49*  --  55*  --  56*  --  68* 73* 74* 75* 77*  CREATININE 1.83*  --  1.95*  --  2.34*  --  2.30*  --  2.98* 3.22* 3.33* 3.37* 3.46*  CALCIUM 7.9*  --  7.5*  --  7.2*  --  7.1*  --  7.3* 7.4* 7.8* 8.0* 7.9*  MG 3.3*  --  3.1*  --  2.7*  --  2.5*  --  2.4 2.4 2.5*  --  2.2  PHOS 5.1*  --  4.4  --  3.5  --   --   --   --   --   --   --  6.7*   < > = values in this interval not displayed.    Liver Function Tests: Recent Labs  Lab 09/23/20 0311 09/26/20 0413 09/29/20 0602  AST 131* 54* 29  ALT 94* 9 9  ALKPHOS 77 49 114  BILITOT 0.9 0.6 1.1  PROT 7.2 4.5* 5.1*  ALBUMIN 3.5 1.9* 1.6*   No results for input(s): LIPASE, AMYLASE in the last 168 hours. No results for input(s): AMMONIA in the last 168 hours.  CBC: Recent Labs  Lab 09/23/20 0311 09/23/20 0859 09/25/20 2009 09/25/20 2230 09/26/20 0413 09/26/20 0539 09/27/20 0928 09/28/20 0542 09/29/20 0602  WBC 14.5*   < > 7.6  --  5.4  --  10.9* 9.5 11.1*  NEUTROABS 12.5*  --   --   --   --   --   --   --   --   HGB 7.5*   < > 10.8*  11.6*   < > 10.2* 10.5* 8.6* 9.2* 9.2*  HCT 24.7*   < > 34.0*  34.0*   < > 30.8* 31.0* 25.7* 26.5* 27.9*  MCV 80.7   < > 84.8  --  82.6  --  81.3 78.6* 81.8  PLT 269   < > 83*  --  61*  --  80* 93* 95*   < > = values in this interval not displayed.    Cardiac Enzymes: Recent Labs  Lab 09/23/20 0554  CKTOTAL 455*  CKMB 65.8*    BNP: BNP (last 3  results) Recent Labs    09/23/20 0311  BNP 1,191.8*    ProBNP (last 3 results) No results for input(s): PROBNP in the last 8760 hours.    Other results:  Imaging: DG Chest 1 View  Result Date: 09/28/2020 CLINICAL DATA:  Recent open heart surgery, ET tube EXAM: CHEST  1 VIEW COMPARISON:  09/27/2020 FINDINGS: Support devices remain in stable position. Postoperative changes from CABG. Cardiomegaly with vascular congestion. Bibasilar opacities, likely atelectasis. Suspect small effusions. IMPRESSION: Support devices stable. Cardiomegaly with vascular congestion. Suspect small effusions with bibasilar atelectasis. Electronically Signed   By: Rolm Baptise M.D.   On: 09/28/2020 08:53   DG Chest 1 View  Result Date: 09/27/2020 CLINICAL DATA:  ET tube EXAM: CHEST  1 VIEW COMPARISON:  09/26/2020 FINDINGS: Support devices remain in place, unchanged. Cardiomegaly, vascular congestion. Bilateral airspace disease similar to prior study. No visible significant effusions or pneumothorax. IMPRESSION: Stable support devices. Bilateral airspace disease could reflect edema or infection. Electronically Signed   By: Rolm Baptise M.D.   On: 09/27/2020 10:00   DG Abd 1 View  Result Date: 09/27/2020 CLINICAL DATA:  Ileus EXAM: ABDOMEN - 1 VIEW COMPARISON:  09/26/2020 FINDINGS: Gas-filled, nondistended loops of small bowel in the central abdomen measuring up to 3.5 cm in caliber. Non weighted enteric feeding tube projects with tip below the diaphragm in the vicinity of the pylorus. No obvious free air on single supine radiograph which incompletely covers the abdomen. IMPRESSION: Gas-filled, nondistended loops of small bowel in the central abdomen measuring up to 3.5 cm in caliber. No overt evidence of ileus or bowel obstruction. Electronically Signed   By: Eddie Candle M.D.   On: 09/27/2020 08:36   DG Abd Portable 1V  Result Date: 09/28/2020 CLINICAL DATA:  Feeding tube  placement EXAM: PORTABLE ABDOMEN - 1 VIEW  COMPARISON:  Portable exam 1617 hours compared to 1002 hours FINDINGS: Patient is severely rotated to the RIGHT. Feeding tube appears to traverse the stomach and duodenum with tip potentially at ligament of Treitz position, suboptimally assessed due to rotation. Visualized bowel gas pattern normal. Tip of nasogastric tube in stomach. IMPRESSION: Significant patient rotation to RIGHT limits exam. Tip of feeding tube is probably at the region of ligament of Treitz. Electronically Signed   By: Lavonia Dana M.D.   On: 09/28/2020 16:38   DG Abd Portable 1V  Result Date: 09/28/2020 CLINICAL DATA:  Recent surgery with nasogastric and feeding tubes present EXAM: PORTABLE ABDOMEN - 1 VIEW COMPARISON:  Sep 27, 2020 FINDINGS: Nasogastric tube tip and side port in stomach. Feeding tube tip is at the pylorus level. There is fluid throughout much of the bowel. There is no bowel dilatation or air-fluid level to suggest bowel obstruction. No free air evident on supine examination. IMPRESSION: Enteric tubes as noted. Most bowel loops are fluid-filled. While this finding can be seen normally, it raises question of a degree of ileus or enteritis. Bowel obstruction not felt to be likely. No free air evident on supine examination. Electronically Signed   By: Lowella Grip III M.D.   On: 09/28/2020 11:41   DG Abd Portable 1V  Result Date: 09/27/2020 CLINICAL DATA:  OG tube placement EXAM: PORTABLE ABDOMEN - 1 VIEW COMPARISON:  09/27/2020 FINDINGS: Enteric tube is in place with the tip in the stomach. Feeding tube tip is in the right upper quadrant which could be within the distal stomach or proximal duodenum. IMPRESSION: Enteric tube and feeding tube position as above. Electronically Signed   By: Rolm Baptise M.D.   On: 09/27/2020 10:00     Medications:     Scheduled Medications: . sodium chloride   Intravenous Once  . acetaminophen (TYLENOL) oral liquid 160 mg/5 mL  650 mg Per Tube Once   Or  . acetaminophen  650  mg Rectal Once  . aspirin  300 mg Rectal Daily  . bisacodyl  10 mg Rectal Daily  . chlorhexidine gluconate (MEDLINE KIT)  15 mL Mouth Rinse BID  . Chlorhexidine Gluconate Cloth  6 each Topical Daily  . heparin injection (subcutaneous)  5,000 Units Subcutaneous Q8H  . insulin aspart  0-24 Units Subcutaneous Q4H  . ipratropium-albuterol  3 mL Nebulization Q6H  . mouth rinse  15 mL Mouth Rinse 10 times per day  . neostigmine  0.25 mg Subcutaneous Q6H  . pantoprazole (PROTONIX) IV  40 mg Intravenous Q24H  . sodium chloride flush  10-40 mL Intracatheter Q12H  . sodium chloride flush  3 mL Intravenous Q12H    Infusions: . sodium chloride Stopped (09/26/20 1340)  . sodium chloride    . sodium chloride 20 mL/hr at 09/28/20 1900  . amiodarone 30 mg/hr (09/29/20 0616)  . dexmedetomidine (PRECEDEX) IV infusion 0.7 mcg/kg/hr (09/29/20 0615)  . fentaNYL infusion INTRAVENOUS 125 mcg/hr (09/29/20 0128)  . furosemide (LASIX) 200 mg in dextrose 5% 100 mL ($Remov'2mg'rMYuIN$ /mL) infusion 8 mg/hr (09/28/20 2000)  . insulin 1.9 Units/hr (09/25/20 0600)  . lactated ringers 20 mL/hr at 09/28/20 1900  . lactated ringers 20 mL/hr at 09/28/20 1900  . midazolam 2 mg/hr (09/29/20 0127)  . milrinone 0.25 mcg/kg/min (09/29/20 0102)  . norepinephrine (LEVOPHED) Adult infusion 8 mcg/min (09/28/20 2338)  . piperacillin-tazobactam (ZOSYN)  IV 2.25 g (09/29/20 0558)    PRN Medications: sodium chloride,  acetaminophen, albuterol, dextrose, fentaNYL (SUBLIMAZE) injection, metoprolol tartrate, midazolam, ondansetron (ZOFRAN) IV, sodium chloride flush, sodium chloride flush   Assessment/Plan:   1. 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  - hemodynamics ok. CO-OX stable.  - Continue to  wean norepi to maintain Map >70 -  milrinone 0.25 + norepi 8  -  Volume status improving but needs more diuresis.  - Continue lasix drip at 8 mg per hour. Brisk diuresis. -  2. NSTEMI with critical CAD - 95% LM and high grade ostial RCA - CABG on 5/6 for critical LM disease (LIMA -> LAD, SVG -> OM, SVG -> RCA) - Continue ASA/statin  - Consider Plavix now that IABP out but abdominal distention may need to wait.  - See above. Hgb stable at 9.2   3. Acute hypoxic respiratory failure due to pulmonary edema - on vent - Possible aspiration 5/8 & 5/10  - 5/6 Blood CX - NG - On antibiotics  - CCM following  4. AKI  - due to ATN - Cr 1.0 -> 1.7 -> 1.95-->2.3 ->3->3.3->3.5  - Support hemodynamics.   5. Ventral Hernia  5/10 NG placed to decompress   6. ID On antibiotic for possible aspiration.  Blood CX 5/6- NGTD TMax 102.  WBC 11  He has central line. Remove swan. Check lower extremity dopplers.   Discussed with [place] and Dr Lynetta Mare at bedside.    Length of Stay: [place] NP-C  09/29/2020, 7:16 AM  Advanced Heart Failure Team Pager [phone] (M-F; 7a - 4p)  Please contact [facility] for night-coverage after hours (4p -7a ) and weekends on [email or web address]   Agree with above  Remains intubated sedated. Hemodynamics improved but PAPI still low (1.2). Continues to struggle with ileus. Febrile overnight. Has diuresed well. Creatinine up slightly again  General:  Intubated/sedated HEENT: normal + ETT Neck: supple. no JVD. Carotids 2+ bilat; no bruits. No lymphadenopathy or thryomegaly appreciated. Cor: PMI nondisplaced. Sternal wound ok  Regular rate & rhythm.  Lungs: clear Abdomen: soft, nontender, large ventral hernia decreased BS Extremities: no cyanosis, clubbing, rash, 2+ edema Neuro: sedated  Making slow progress but still tenuous. Plan - continue IV diuresis - pull swan - continue bowel regimen - attempt to lighten sedation and possible SBT - wean NE slowly as tolerated - watch renal function closely.   CRITICAL  CARE Performed by: Glori Bickers  Total critical care time: 35 minutes  Critical care time was exclusive of separately billable procedures and treating other patients.  Critical care was necessary to treat or prevent imminent or life-threatening deterioration.  Critical care was time spent personally by me (independent of midlevel providers or residents) on the following activities: development of treatment plan with patient and/or surrogate as well as nursing, discussions with consultants, evaluation of patient's response to treatment, examination of patient, obtaining history from patient or surrogate, ordering and performing treatments and interventions, ordering and review of laboratory studies, ordering and review of radiographic studies, pulse oximetry and re-evaluation of patient's condition.  Glori Bickers, MD  8:32 AM

## 2020-09-29 NOTE — Progress Notes (Addendum)
Nutrition Follow Up  DOCUMENTATION CODES:   Not applicable  INTERVENTION:   TPN started, plan titration to goal- monitor for signs of refeeding   Add thiamine and folic acid   Flush Cortrak tube with 30 ml free water Q4 hours to maintain patency  Once able to restart tube feeding:  -Pivot 1.5 @ 20 ml/hr via Cortrak -Increase by tolerance to goal rate of 60 ml/hr (1440 ml) -ProSource TF 45 ml BID  Goal rate provides: 2240 kcals, 157 grams protein, 1093 ml free water.   NUTRITION DIAGNOSIS:   Increased nutrient needs related to post-op healing as evidenced by estimated needs.  Ongoing  GOAL:   Patient will meet greater than or equal to 90% of their needs   Progressing with TPN  MONITOR:   Vent status,Labs,I & O's,Skin,TF tolerance,Weight trends  REASON FOR ASSESSMENT:   Consult Enteral/tube feeding initiation and management  ASSESSMENT:   Patient with PMH significant for DDD, asthma, HTN, and large ventral hernia. Presented to [facility] ED with shortness of breath, was found to be in cardiogenic shock with NSTEMI.   5/06- s/p heart cath, confirmed critical CAD, s/p emergent CABG x3 wih IABP placement 5/08- large vomiting episode, TF held 5/09- s/p IABP removed, trickle TF started  5/10- large vomiting episode, TF held 5/11- Cortrak tube advanced to ligament of Treitz 5/12- TPN started   Pt discussed during ICU rounds and with RN.   Requiring levophed. Febrile overnight. Had large liquid bowel movement with SMOG enema this afternoon.   TPN to start at 45 ml/hr (provides 65 g protein and 1032 kcal) with plan to increase to goal rate of 95 ml/hr as appropriate (provides 137 g protein and 2178 kcal). Patient at risk for refeeding given lack of nutrition x 7 days.   Cortrak tube advanced to postpyloric position yesterday. Xray reads tube tip located in the distal duodenum or proximal jejunum. Large bore NG placed for intermittent suction. Discussed starting trickles with  CCM/TCTS. Plan to start tomorrow.   Spoke with sister who reports patient did not experience difficulty eating PTA. States he typically eats 3-4 larger meals daily. Has recurrent issues with constipation given large ventral hernia.   Of note, an abdominal CT taken on 12/03/2017 reported large ventral hernia that contains stomach body, antrum, cecum, ascending colon, transverse colon and multiple loops of small bowel including the terminal ileum. Patient was referred to [facility] in 2019 for possible repair with interval appendectomy (given perforated appendicitis) but was treated nonoperatively. He is followed by GI who recommends abdominal binders to slow the growth of hernia and miralax to help with constipation. There are mentions in pervious provider notes of ETOH use, but actual consumption is unknown.   Admission weight: 83 kg  Current weight: 85.8 kg   Patient remains intubated on ventilator support MV: 11.7 L/min Temp (24hrs), Avg:100.8 F (38.2 C), Min:98.8 F (37.1 C), Max:102.02 F (38.9 C)  UOP: 5750 ml x 24 hrs  NG: 800 ml x 24 hrs (bilious)  Drips: precedex, 200 mg lasix in D5 @ 4 ml/hr, levophed, 10 mEq KCl x 6 runs  Medications: dulcolax, SS novolog, protonix  Labs: K 3.2 (L) Phosphorus 6.7 (H) Cr 3.46- trending up  Diet Order:   Diet Order    None      EDUCATION NEEDS:   Not appropriate for education at this time  Skin:  Skin Assessment: Skin Integrity Issues: Skin Integrity Issues:: Incisions Incisions: bilateral legs, chest  Last BM:  5/12  Height:   Ht Readings from Last 1 Encounters:  09/23/20 5\' 6"  (1.676 m)    Weight:   Wt Readings from Last 1 Encounters:  09/29/20 85.8 kg   BMI:  Body mass index is 30.53 kg/m.  Estimated Nutritional Needs:   Kcal:  11/29/20 kcal  Protein:  135-180 grams  Fluid:  >/= 2 L/day  [phone] RD, LDN Clinical Nutrition Pager listed in AMION

## 2020-09-29 NOTE — Progress Notes (Signed)
[address]       [place] [address]             [phone]                 6 Days Post-Op Procedure(s) (LRB): CORONARY ARTERY BYPASS GRAFTING (CABG)  TIMES 4 USING LEFT GREATER SAPHENOUS VEIN HARVESTED ENDOSCOPICALLY AND LEFT INTERNAL MAMMARY ARTERY (N/A) TRANSESOPHAGEAL ECHOCARDIOGRAM (TEE) (N/A)   Events: No events  _______________________________________________________________ Vitals: BP (!) 137/56 (BP Location: Left Arm)   Pulse 98   Temp (!) 100.76 F (38.2 C)   Resp (!) 22   Ht 5\' 6"  (1.676 m)   Wt 85.8 kg   SpO2 96%   BMI 30.53 kg/m   - Neuro: Sedated.  Precedex at 0.7, fentanyl at 125, Versed 2  - Cardiovascular: Normal sinus rhythm.  I  Drips:  milrinone at 0.25, Levophed at 8 Lasix at 8 PAP: (28-47)/(17-25) 29/18 CVP:  [8 mmHg-18 mmHg] 8 mmHg  - Pulm:  Vent Mode: PRVC FiO2 (%):  [40 %-60 %] 40 % Set Rate:  [22 bmp] 22 bmp Vt Set:  [560 mL] 560 mL PEEP:  [5 cmH20] 5 cmH20 Plateau Pressure:  [20 cmH20-22 cmH20] 22 cmH20  ABG    Component Value Date/Time   PHART 7.410 09/26/2020 0539   PCO2ART 31.1 (L) 09/26/2020 0539   PO2ART 136 (H) 09/26/2020 0539   HCO3 19.9 (L) 09/26/2020 0539   TCO2 21 (L) 09/26/2020 0539   ACIDBASEDEF 4.0 (H) 09/26/2020 0539   O2SAT 87.9 09/29/2020 0602    - Abd: Distended, ventral hernia  - Extremity: Warm  .Intake/Output      05/11 0701 05/12 0700 05/12 0701 05/13 0700   I.V. (mL/kg) 2862.1 (33.4)    NG/GT     IV Piggyback 385.1    Total Intake(mL/kg) 3247.3 (37.8)    Urine (mL/kg/hr) 5750 (2.8)    Emesis/NG output 800    Total Output 6550    Net -3302.8            _______________________________________________________________ Labs: CBC Latest Ref Rng & Units 09/29/2020 09/28/2020 09/27/2020  WBC 4.0 - 10.5 K/uL 11.1(H) 9.5 10.9(H)  Hemoglobin 13.0 - 17.0 g/dL 11/27/2020) 4.6(E) 7.0(J)  Hematocrit 39.0 - 52.0 % 27.9(L) 26.5(L) 25.7(L)  Platelets 150 - 400 K/uL 95(L) 93(L) 80(L)    CMP Latest Ref Rng & Units 09/29/2020 09/28/2020 09/28/2020  Glucose 70 - 99 mg/dL 11/28/2020) 938(H) 96  BUN 8 - 23 mg/dL 829(H) 37(J) 69(C)  Creatinine 0.61 - 1.24 mg/dL 78(L) 3.81(O) 1.75(Z)  Sodium 135 - 145 mmol/L 140 139 138  Potassium 3.5 - 5.1 mmol/L 3.2(L) 3.5 3.1(L)  Chloride 98 - 111 mmol/L 99 101 101  CO2 22 - 32 mmol/L 23 22 23   Calcium 8.9 - 10.3 mg/dL 7.9(L) 8.0(L) 7.8(L)  Total Protein 6.5 - 8.1 g/dL 5.1(L) - -  Total Bilirubin 0.3 - 1.2 mg/dL 1.1 - -  Alkaline Phos 38 - 126 U/L 114 - -  AST 15 - 41 U/L 29 - -  ALT 0 - 44 U/L 9 - -    CXR: Pulmonary vascular congestion with pleural effusions.  _______________________________________________________________  Assessment and Plan: POD 6 s/p emergency CABG.  Neuro: Wean sedation as tolerated. CV: weaning support as tolerated. Pulm: Wean vent as tolerated.  Patient still appears volume overloaded.  Good respiratory mechanics. Renal: creat slowly trending up.  Still making good urine on lasix gtt GI: Tube feeds on  hold.  Ileus, continue gastric decompression.  Will start TPN today Heme: Stable ID: On Zosyn. Endo: Sliding scale insulin. Dispo: Continue ICU care.   Corliss Skains 09/29/2020 7:40 AM

## 2020-09-29 NOTE — Progress Notes (Addendum)
PHARMACY - TOTAL PARENTERAL NUTRITION CONSULT NOTE   Indication: Prolonged ileus  Patient Measurements: Height: 5\' 6"  (167.6 cm) Weight: 85.8 kg (189 lb 2.5 oz) IBW/kg (Calculated) : 63.8 TPN AdjBW (KG): 68.6 Body mass index is 30.53 kg/m.  Assessment: 63 yom presenting with cardiogenic shock secondary to NSTEMI, s/p urgent cath revealing 3V CAD and subsequent emergent CABG. Patient with ileus post-op, prolonged NPO status. Cortrak placed 5/11. Trickle tube were attempted x 2 (5/7-5/8, 5/9-5/11) and held both times with vomiting. Pharmacy consulted to start TPN.  Patient currently intubated, sedated in the ICU on fentanyl/Precedex/Versed, continuing on support with milrinone and norepinephrine. Also on amiodarone drip per TCTS.  Glucose / Insulin: No previous hx DM. A1c 6.3% on 09/25/20. CBGs <180. Continues on insulin drip at 1.9 units/hr + utilized 4 units SSI in last 24hrs Electrolytes: K down to 3.2 (s/p K runs x 10 yesterday; goal >/=4 with ileus), phos high 6.7 (Ca x Phos = 66); others WNL Renal: AKI - SCr up to 3.46 (SCr 1 on 5/6), BUN up to 77. Lasix drip at 8 mg/hr, -3.3L Hepatic: LFTs / Tbili / TG WNL. Albumin 1.6, prealbumin pending Intake / Output; MIVF: UOP 2.8 ml/kg/hr, NGT output 800 ml/24hrs GI Imaging: none since TPN start GI Surgeries / Procedures: none since TPN start  Central access: CVC triple lumen placed 09/24/20 TPN start date: 09/29/20  Nutritional Goals (per RD recommendation on 09/27/20): kCal: 11/27/20, Protein: 135-180g, Fluid: >/=2 L/day  Goal TPN rate is 95 mL/hr (provides 137 g of protein and 2178 kcals per day)  Current Nutrition:  NPO  Plan:  Unable to concentrate TPN due to national shortage of Clinisol.  Start TPN at 45 mL/hr at 1800. Titrate to goal as appropriate. TPN will provide 65g protein and 1032kCal, meeting ~47% of patient needs Electrolytes in TPN: initiate standard lytes except reduced phos - Na 52mEq/L, K 22mEq/L, Ca 16mEq/L, Mg  77mEq/L, and reduce Phos to 5 mmol/L. Cl:Ac 1:1 K runs x 6 already ordered per HF provider today Add standard MVI and trace elements + thiamine and folic acid per dietitian recommendation to TPN Continue TCTS q4h SSI for now and adjust as needed. Insulin drip d/c'd this AM per TCTS provider Monitor TPN labs, for possible refeeding F/u plans for extubation, toleration of TF/diet and ability to wean TPN   4m, PharmD, BCPS Please check AMION for all [facility] contact numbers Clinical Pharmacist 09/29/2020 8:17 AM

## 2020-09-29 NOTE — Progress Notes (Signed)
Lower extremity venous bilateral study completed.   Please see CV Proc for preliminary results.   Skyler Carel, RDMS, RVT  

## 2020-09-29 NOTE — Progress Notes (Signed)
NAME:  THOMS BARTHELEMY, MRN:  [id number], DOB:  07-25-1940, LOS: 6 ADMISSION DATE:  09/23/2020, CONSULTATION DATE:  09/24/2019 REFERRING MD:  Jacinto Halim, CHIEF COMPLAINT:  Acute coronary syndrome   History of Present Illness:  80 year old man who presented with cardiogenic shock secondary to NSTEMI.  Presented with new onset RSCP and severe dyspnea. Found to have acute pulmonary edema and heart failure; intubated and sent for urgent LHC which revealed 3V CAD.  IABP inserted and patient underwent urgent CABG. IABP removed 5/9.  Pertinent  Medical History   Past Medical History:  Diagnosis Date  . Arthritis   . Asthma   . DDD (degenerative disc disease), lumbar   . Hernia of abdominal wall   . RLS (restless legs syndrome)    Significant Hospital Events: Including procedures, antibiotic start and stop dates in addition to other pertinent events   . 5/6 CABG with Dr Cliffton Asters with LIMA to LAD, SVG to RCA d and SVG to ramus. LV normal and returned to ICU with no vasopressor support.  . No post-operative bleeding but poor RV performance on hemodynamics.  . 5/8 significant hemodynamic instability with labile blood pressure and vasopressor requirements yesterday.  Episode of emesis but no evidence of bowel obstruction.  Evidence of pericardial tamponade. . 5/9 improved hemodynamic, able now to tolerate balloon pump weaning and removal. . 5/11 Stable hemodynamics, continuing diuresis. Improved UOP. TPN consult for initiation 5/12 in the setting of ileus, prolonged NPO status.   Interim History / Subjective:  No further emesis.  Patient wakes up agitated when stimulated.  Febrile overnight  Objective   Blood pressure (!) 137/56, pulse 98, temperature (!) 100.58 F (38.1 C), resp. rate (!) 22, height 5\' 6"  (1.676 m), weight 85.8 kg, SpO2 97 %. PAP: (28-47)/(17-25) 29/18 CVP:  [8 mmHg-18 mmHg] 10 mmHg CO:  [6.7 L/min] 6.7 L/min CI:  [3.4 L/min/m2] 3.4 L/min/m2  Vent Mode: PRVC FiO2 (%):  [40 %-50  %] 40 % Set Rate:  [22 bmp] 22 bmp Vt Set:  [560 mL] 560 mL PEEP:  [5 cmH20] 5 cmH20 Plateau Pressure:  [20 cmH20-22 cmH20] 22 cmH20   Intake/Output Summary (Last 24 hours) at 09/29/2020 0933 Last data filed at 09/29/2020 0800 Gross per 24 hour  Intake 3247.25 ml  Output 5850 ml  Net -2602.75 ml   Filed Weights   09/27/20 0500 09/28/20 0500 09/29/20 0615  Weight: 90 kg 88.4 kg 85.8 kg   Pulmonary artery pulsatility index: 1.2  Physical Examination: General: Chronically ill-appearing elderly man in NAD. HEENT: [place]/AT, anicteric sclera, PERRL (40mm), moist mucous membranes. ETT in place. Neuro: Sedated.  Agitation with stimulation  CV: Regular rhythm with PVCs. Midline sternotomy with dressing intact. PULM: Breathing even and unlabored on vent (PEEP 5, FiO2 60%). Lung fields coarse throughout. GI: Obese, soft but protuberant. Mildly distended. Midline prior incision. Large ventral hernia. Hypoactive bowel sounds. Extremities: Bilateral symmetric 2+ pitting LE edema noted. Swan to R groin. Skin: Warm/dry, no rashes noted.  Labs/imaging that I have personally reviewed: (right click and "Reselect all SmartList Selections" daily)    Resolved Hospital Problem List   NSTEMI  Assessment & Plan:  Critically ill due to acute hypoxic and hypercapnic respiratory failure requiring mechanical ventilation Possible aspiration Critically ill due to RV dysfunction following ACS and CPB Cardiogenic shock NSTEMI with culprit RCA involvement ( RV involvement?) Coronary artery disease Acute kidney injury Gastric ileus Thrombocytopenia, improving Agitated delirium controlled Alcohol abuse  Plan:  -Continue to wean norepinephrine  off. -Continue milrinone at current dose for RV support.  (Epinephrine and dobutamine have been stopped) -Continue current rate of diuresis.  There has been improvement in his edema but he is still quite volume overloaded. -Reposition OG tube to NG for gastric  drainage.  Continues to have an ileus.  Will need GI decompression even after extubation. -Wean sedation initiate SBT -Initiate TPN with progressive titration of enteral feeds. -Discontinue right groin introducer, duplex Doppler both legs for DVT as possible cause of fever.  Most likely due to ongoing ileus. -Continue antibiotics for now.  Best Practice: (right click and "Reselect all SmartList Selections" daily)  Diet:  NPO, TPN consult placed, will start trickle feeds thereafter Pain/Anxiety/Delirium protocol (if indicated): Yes (RASS goal -1,-2) wean sedation and proceed to SBT l VAP protocol (if indicated): Yes DVT prophylaxis: Subcutaneous Heparin, Plt count > 90  GI prophylaxis: PPI Glucose control:  SSI Yes Central venous access:  Yes, and it is still needed Arterial line:  Yes, and it is still needed Foley:  Yes, and it is still needed Mobility:  bed rest  PT consulted: N/A Last date of multidisciplinary goals of care discussion [Per Primary] Code Status:  full code Disposition: ICU  Critical care time: 40 minutes   Lynnell Catalan, MD [facility] 09/29/20 9:33 AM  Please see [email or web address] for pager details.  From 7A-7P if no response, please call [phone] After hours, please call ELink [phone]

## 2020-09-30 ENCOUNTER — Inpatient Hospital Stay (HOSPITAL_COMMUNITY): Payer: No Typology Code available for payment source

## 2020-09-30 ENCOUNTER — Inpatient Hospital Stay: Payer: Self-pay

## 2020-09-30 DIAGNOSIS — R570 Cardiogenic shock: Secondary | ICD-10-CM | POA: Diagnosis not present

## 2020-09-30 DIAGNOSIS — I5021 Acute systolic (congestive) heart failure: Secondary | ICD-10-CM | POA: Diagnosis not present

## 2020-09-30 DIAGNOSIS — Z951 Presence of aortocoronary bypass graft: Secondary | ICD-10-CM | POA: Diagnosis not present

## 2020-09-30 LAB — URINALYSIS, ROUTINE W REFLEX MICROSCOPIC
Bilirubin Urine: NEGATIVE
Glucose, UA: NEGATIVE mg/dL
Ketones, ur: NEGATIVE mg/dL
Nitrite: NEGATIVE
Protein, ur: NEGATIVE mg/dL
Specific Gravity, Urine: 1.01 (ref 1.005–1.030)
pH: 5 (ref 5.0–8.0)

## 2020-09-30 LAB — ECHO INTRAOPERATIVE TEE
Height: 66 in
MV M vel: 3.64 m/s
MV Peak grad: 53 mmHg
Weight: 2928 oz

## 2020-09-30 LAB — COMPREHENSIVE METABOLIC PANEL
ALT: 7 U/L (ref 0–44)
AST: 29 U/L (ref 15–41)
Albumin: 1.6 g/dL — ABNORMAL LOW (ref 3.5–5.0)
Alkaline Phosphatase: 117 U/L (ref 38–126)
Anion gap: 13 (ref 5–15)
BUN: 80 mg/dL — ABNORMAL HIGH (ref 8–23)
CO2: 29 mmol/L (ref 22–32)
Calcium: 8.4 mg/dL — ABNORMAL LOW (ref 8.9–10.3)
Chloride: 102 mmol/L (ref 98–111)
Creatinine, Ser: 2.92 mg/dL — ABNORMAL HIGH (ref 0.61–1.24)
GFR, Estimated: 21 mL/min — ABNORMAL LOW (ref >60)
Glucose, Bld: 195 mg/dL — ABNORMAL HIGH (ref 70–99)
Potassium: 2.4 mmol/L — CL (ref 3.5–5.1)
Sodium: 144 mmol/L (ref 135–145)
Total Bilirubin: 0.8 mg/dL (ref 0.3–1.2)
Total Protein: 5.3 g/dL — ABNORMAL LOW (ref 6.5–8.1)

## 2020-09-30 LAB — GLUCOSE, CAPILLARY
Glucose-Capillary: 202 mg/dL — ABNORMAL HIGH (ref 70–99)
Glucose-Capillary: 164 mg/dL — ABNORMAL HIGH (ref 70–99)
Glucose-Capillary: 129 mg/dL — ABNORMAL HIGH (ref 70–99)
Glucose-Capillary: 150 mg/dL — ABNORMAL HIGH (ref 70–99)
Glucose-Capillary: 168 mg/dL — ABNORMAL HIGH (ref 70–99)

## 2020-09-30 LAB — COOXEMETRY PANEL
Carboxyhemoglobin: 1.3 % (ref 0.5–1.5)
Methemoglobin: 1.2 % (ref 0.0–1.5)
O2 Saturation: 58.5 %
Total hemoglobin: 7.6 g/dL — ABNORMAL LOW (ref 12.0–16.0)

## 2020-09-30 LAB — BASIC METABOLIC PANEL
Anion gap: 12 (ref 5–15)
BUN: 80 mg/dL — ABNORMAL HIGH (ref 8–23)
CO2: 30 mmol/L (ref 22–32)
Calcium: 8.2 mg/dL — ABNORMAL LOW (ref 8.9–10.3)
Chloride: 100 mmol/L (ref 98–111)
Creatinine, Ser: 2.76 mg/dL — ABNORMAL HIGH (ref 0.61–1.24)
GFR, Estimated: 23 mL/min — ABNORMAL LOW (ref >60)
Glucose, Bld: 162 mg/dL — ABNORMAL HIGH (ref 70–99)
Potassium: 3 mmol/L — ABNORMAL LOW (ref 3.5–5.1)
Sodium: 142 mmol/L (ref 135–145)

## 2020-09-30 LAB — DIFFERENTIAL
Abs Immature Granulocytes: 0.2 10*3/uL — ABNORMAL HIGH (ref 0.00–0.07)
Basophils Absolute: 0 10*3/uL (ref 0.0–0.1)
Basophils Relative: 0 %
Eosinophils Absolute: 0.1 10*3/uL (ref 0.0–0.5)
Eosinophils Relative: 1 %
Immature Granulocytes: 2 %
Lymphocytes Relative: 3 %
Lymphs Abs: 0.3 10*3/uL — ABNORMAL LOW (ref 0.7–4.0)
Monocytes Absolute: 0.6 10*3/uL (ref 0.1–1.0)
Monocytes Relative: 5 %
Neutro Abs: 9.8 10*3/uL — ABNORMAL HIGH (ref 1.7–7.7)
Neutrophils Relative %: 89 %

## 2020-09-30 LAB — PREALBUMIN: Prealbumin: 7.2 mg/dL — ABNORMAL LOW (ref 18–38)

## 2020-09-30 LAB — CBC
HCT: 28.2 % — ABNORMAL LOW (ref 39.0–52.0)
Hemoglobin: 9.2 g/dL — ABNORMAL LOW (ref 13.0–17.0)
MCH: 26.6 pg (ref 26.0–34.0)
MCHC: 32.6 g/dL (ref 30.0–36.0)
MCV: 81.5 fL (ref 80.0–100.0)
Platelets: 90 10*3/uL — ABNORMAL LOW (ref 150–400)
RBC: 3.46 MIL/uL — ABNORMAL LOW (ref 4.22–5.81)
RDW: 17.9 % — ABNORMAL HIGH (ref 11.5–15.5)
WBC: 11 10*3/uL — ABNORMAL HIGH (ref 4.0–10.5)
nRBC: 0 % (ref 0.0–0.2)

## 2020-09-30 LAB — PHOSPHORUS: Phosphorus: 4.4 mg/dL (ref 2.5–4.6)

## 2020-09-30 LAB — MAGNESIUM: Magnesium: 2.4 mg/dL (ref 1.7–2.4)

## 2020-09-30 LAB — TRIGLYCERIDES: Triglycerides: 216 mg/dL — ABNORMAL HIGH (ref <150)

## 2020-09-30 MED ORDER — SODIUM CHLORIDE 0.9% FLUSH
10.0000 mL | INTRAVENOUS | Status: DC | PRN
Start: 2020-09-30 — End: 2020-10-20

## 2020-09-30 MED ORDER — TRAVASOL 10 % IV SOLN
INTRAVENOUS | Status: AC
  Filled 2020-09-30: qty 1008

## 2020-09-30 MED ORDER — PIPERACILLIN-TAZOBACTAM 3.375 G IVPB
3.3750 g | Freq: Three times a day (TID) | INTRAVENOUS | Status: DC
  Administered 2020-09-30 – 2020-10-03 (×9): 3.375 g via INTRAVENOUS
  Filled 2020-09-30 (×10): qty 50

## 2020-09-30 MED ORDER — METOCLOPRAMIDE HCL 5 MG/ML IJ SOLN
5.0000 mg | Freq: Four times a day (QID) | INTRAMUSCULAR | Status: DC
  Administered 2020-09-30 – 2020-10-12 (×46): 5 mg via INTRAVENOUS
  Filled 2020-09-30 (×45): qty 2

## 2020-09-30 MED ORDER — ACETAMINOPHEN 10 MG/ML IV SOLN
1000.0000 mg | Freq: Four times a day (QID) | INTRAVENOUS | Status: AC
  Administered 2020-09-30 – 2020-10-01 (×4): 1000 mg via INTRAVENOUS
  Filled 2020-09-30 (×4): qty 100

## 2020-09-30 MED ORDER — POTASSIUM CHLORIDE 10 MEQ/50ML IV SOLN
10.0000 meq | INTRAVENOUS | Status: DC
  Administered 2020-09-30: 10 meq via INTRAVENOUS
  Filled 2020-09-30: qty 50

## 2020-09-30 MED ORDER — SODIUM CHLORIDE 0.9% FLUSH
10.0000 mL | Freq: Two times a day (BID) | INTRAVENOUS | Status: DC
  Administered 2020-09-30 – 2020-10-03 (×3): 10 mL
  Administered 2020-10-03 – 2020-10-08 (×2): 20 mL
  Administered 2020-10-08 – 2020-10-09 (×2): 10 mL
  Administered 2020-10-09: 20 mL
  Administered 2020-10-10 – 2020-10-14 (×9): 10 mL
  Administered 2020-10-14: 30 mL
  Administered 2020-10-15: 10 mL

## 2020-09-30 MED ORDER — POTASSIUM CHLORIDE 10 MEQ/50ML IV SOLN
10.0000 meq | INTRAVENOUS | Status: AC
  Administered 2020-09-30 (×6): 10 meq via INTRAVENOUS
  Filled 2020-09-30 (×6): qty 50

## 2020-09-30 MED ORDER — POTASSIUM CHLORIDE 10 MEQ/50ML IV SOLN
10.0000 meq | INTRAVENOUS | Status: AC
  Administered 2020-09-30 (×5): 10 meq via INTRAVENOUS
  Filled 2020-09-30 (×5): qty 50

## 2020-09-30 NOTE — Progress Notes (Signed)
Date and time results received: 09/30/20 0540  Test: K+ Critical Value: 2.4  Name of Provider Notified: E-link notified   Orders Received? Or Actions Taken?: Waiting for new orders

## 2020-09-30 NOTE — Progress Notes (Signed)
PHARMACY - TOTAL PARENTERAL NUTRITION CONSULT NOTE   Indication: Prolonged ileus  Patient Measurements: Height: 5\' 6"  (167.6 cm) Weight: 80.4 kg (177 lb 4 oz) IBW/kg (Calculated) : 63.8 TPN AdjBW (KG): 68.6 Body mass index is 28.61 kg/m.  Assessment: 79 yom presenting with cardiogenic shock secondary to NSTEMI, s/p urgent cath revealing 3V CAD and subsequent emergent CABG. Patient with ileus post-op, prolonged NPO status. Cortrak placed 5/11. Trickle tube were attempted x 2 (5/7-5/8, 5/9-5/11) and held both times with vomiting. Pharmacy consulted to start TPN.  Patient currently intubated, sedated in the ICU on fentanyl/Precedex/Versed, continuing on support with milrinone and norepinephrine. Also on amiodarone drip per TCTS.  Glucose / Insulin: No previous hx DM. A1c 6.3% on 09/25/20. CBGs mostly <180. Insulin drip off, utilized 16 units SSI in last 24hrs Electrolytes: K down to 2.4 (s/p K runs x 6 yesterday; goal >/=4 with ileus), Phos down WNL (Ca x Phos = 45); others WNL Renal: AKI - SCr down to 2.92 (SCr 1 on 5/6), BUN up to 80. Lasix drip at 8 mg/hr, -2.7L Hepatic: LFTs / Tbili WNL, TG up 216. Albumin 1.6, prealbumin 7.2 Intake / Output; MIVF: UOP 3.3 ml/kg/hr, NGT output 500 ml/24hrs, +BM s/p SMOG enema  GI Imaging: none since TPN start GI Surgeries / Procedures: none since TPN start  Central access: CVC triple lumen placed 09/24/20 (removed 5/13); PICC 09/30/20 TPN start date: 09/29/20  Nutritional Goals (per RD recommendation on 09/29/20): kCal: 11/29/20, Protein: 135-180g, Fluid: >/=2 L/day  Goal TPN rate is 95 mL/hr (provides 137 g of protein and 2178 kcals per day)  Current Nutrition:  NPO and TPN  May resume trickle feeds today   Plan:  Unable to concentrate TPN due to national shortage of Clinisol.  Increase TPN to 70 mL/hr at 1800. Titrate to goal as appropriate. TPN will provide 101g protein and 1605kCal, meeting ~73% of patient needs Electrolytes in TPN: continue  standard lytes except reduced phos - Na 26mEq/L, K 63mEq/L, Ca 56mEq/L, Mg 42mEq/L, and reduced Phos to 5 mmol/L. Cl:Ac 1:1 (all lytes increased with rate increase of TPN) K runs x 6 already ordered per provider today Add standard MVI and trace elements + thiamine and folic acid per dietitian recommendation to TPN Continue TCTS q4h SSI for now and adjust as needed Monitor TPN labs, for possible refeeding F/u plans for extubation, toleration of TF/diet and ability to wean TPN  Thank you for involving pharmacy in this patient's care.  4m, PharmD, BCPS Clinical Pharmacist Clinical phone for 09/30/2020 until 3p is [phone] 09/30/2020 7:01 AM  **Pharmacist phone directory can be found on [email or web address] listed under [facility]**

## 2020-09-30 NOTE — Progress Notes (Signed)
NAME:  Jack Vance, MRN:  [id number], DOB:  02/27/1941, LOS: 7 ADMISSION DATE:  09/23/2020, CONSULTATION DATE:  09/24/2019 REFERRING MD:  Jacinto Halim, CHIEF COMPLAINT:  Acute coronary syndrome   History of Present Illness:  80 year old man who presented with cardiogenic shock secondary to NSTEMI.  Presented with new onset RSCP and severe dyspnea. Found to have acute pulmonary edema and heart failure; intubated and sent for urgent LHC which revealed 3V CAD.  IABP inserted and patient underwent urgent CABG. IABP removed 5/9.  Pertinent  Medical History   Past Medical History:  Diagnosis Date  . Arthritis   . Asthma   . DDD (degenerative disc disease), lumbar   . Hernia of abdominal wall   . RLS (restless legs syndrome)    Significant Hospital Events: Including procedures, antibiotic start and stop dates in addition to other pertinent events   . 5/6 CABG with Dr Cliffton Asters with LIMA to LAD, SVG to RCA d and SVG to ramus. LV normal and returned to ICU with no vasopressor support.  . No post-operative bleeding but poor RV performance on hemodynamics.  . 5/8 significant hemodynamic instability with labile blood pressure and vasopressor requirements yesterday.  Episode of emesis but no evidence of bowel obstruction.  Evidence of pericardial tamponade. . 5/9 improved hemodynamic, able now to tolerate balloon pump weaning and removal. . 5/11 Stable hemodynamics, continuing diuresis. Improved UOP. TPN consult for initiation 5/12 in the setting of ileus, prolonged NPO status.  . 5/13 sedated, diuresis, milrinone, PICC line today, CVL removed   Interim History / Subjective:   Febrile overnight.  Remains on antibiotics.  Sedated on fentanyl, Versed infusions  Objective   Blood pressure (!) 186/62, pulse 66, temperature 100.2 F (37.9 C), resp. rate (!) 22, height 5\' 6"  (1.676 m), weight 80.4 kg, SpO2 98 %.    Vent Mode: PRVC FiO2 (%):  [40 %] 40 % Set Rate:  [22 bmp] 22 bmp Vt Set:  [560 mL]  560 mL PEEP:  [5 cmH20] 5 cmH20 Plateau Pressure:  [20 cmH20-21 cmH20] 21 cmH20   Intake/Output Summary (Last 24 hours) at 09/30/2020 0802 Last data filed at 09/30/2020 0700 Gross per 24 hour  Intake 2980.32 ml  Output 6301 ml  Net -3320.68 ml   Filed Weights   09/28/20 0500 09/29/20 0615 09/30/20 0132  Weight: 88.4 kg 85.8 kg 80.4 kg   Pulmonary artery pulsatility index: 1.2  Physical Examination: General:  Chronically ill-appearing elderly gentleman intubated on mechanical life support critically ill HEENT:  Endotracheal tube in place Neuro:  Sedated on mechanical support CV: Regular rate rhythm, S1-S2 PULM:  Bilateral ventilated breath sounds GI:  Obese, mildly distended, ventral hernia. Extremities:  Lower extremity edema bilaterally Skin:  Warm dry  Labs/imaging that I have personally reviewed: (right click and "Reselect all SmartList Selections" daily)    Resolved Hospital Problem List   NSTEMI  Assessment & Plan:   Acute hypoxemic hypercapnic respiratory failure requiring intubation mechanical ventilation  Possible aspiration Critically ill due to RV dysfunction following ACS and cardiopulmonary bypass  Cardiogenic shock NSTEMI with culprit RCA involvement ( RV involvement?) Coronary artery disease Acute kidney injury Gastric ileus Thrombocytopenia, improving Agitated delirium controlled Alcohol abuse  Plan:  -Continue to wean from norepinephrine Continue milrinone Continue diuresis as he remains fluid overloaded. Remove central line Plans for PICC line placement Exchange Foley Continue TPN Continue to monitor for fevers, both legs negative for DVT Continue Zosyn Would like to restart trickle feeds as soon  as possible   Best Practice: (right click and "Reselect all SmartList Selections" daily)  Diet:  NPO, TPN consult placed, will start trickle feeds thereafter Pain/Anxiety/Delirium protocol (if indicated): Yes (RASS goal -1,-2) wean sedation and  proceed to SBT l VAP protocol (if indicated): Yes DVT prophylaxis: Subcutaneous Heparin, Plt count > 90  GI prophylaxis: PPI Glucose control:  SSI Yes Central venous access:  Yes, and it is still needed Arterial line:  Yes, and it is still needed Foley:  Yes, and it is still needed Mobility:  bed rest  PT consulted: N/A Last date of multidisciplinary goals of care discussion [Per Primary] Code Status:  full code Disposition: ICU  This patient is critically ill with multiple organ system failure; which, requires frequent high complexity decision making, assessment, support, evaluation, and titration of therapies. This was completed through the application of advanced monitoring technologies and extensive interpretation of multiple databases. During this encounter critical care time was devoted to patient care services described in this note for 33 minutes.  Josephine Igo, DO [facility] 09/30/2020 8:02 AM

## 2020-09-30 NOTE — Progress Notes (Signed)
Pharmacy Antibiotic Note  Jack Vance is a 80 y.o. male admitted on 09/23/2020 with mvCAD. Pharmacy has been consulted for Zosyn dosing with concerns for aspiration PNA.  SCr has improved, CrCl ~21.   Plan: Adjust Zosyn to 3.375g IV q 8 hrs EI Watch Cr closely  Height: 5\' 6"  (167.6 cm) Weight: 80.4 kg (177 lb 4 oz) IBW/kg (Calculated) : 63.8  Temp (24hrs), Avg:100.4 F (38 C), Min:98.8 F (37.1 C), Max:101.8 F (38.8 C)  Recent Labs  Lab 09/25/20 1731 09/25/20 2009 09/25/20 2329 09/26/20 0413 09/26/20 0810 09/27/20 0518 09/27/20 0928 09/27/20 1720 09/28/20 0542 09/28/20 1656 09/29/20 0602 09/30/20 0337  WBC  --  7.6  --  5.4  --   --  10.9*  --  9.5  --  11.1* 11.0*  CREATININE  --   --   --  2.30*  --    < >  --  3.22* 3.33* 3.37* 3.46* 2.92*  LATICACIDVEN 3.1* 4.0* 3.5* 2.8* 2.4*  --   --   --   --   --   --   --    < > = values in this interval not displayed.    Estimated Creatinine Clearance: 20.8 mL/min (A) (by C-G formula based on SCr of 2.92 mg/dL (H)).    Allergies  Allergen Reactions  . Cheese     Causes asthma  . Morphine And Related Rash   10/02/20, PharmD, BCPS, [facility] Clinical Pharmacist [phone] Please check AMION for all [facility] Pharm[facility] numbers 09/30/2020

## 2020-09-30 NOTE — Progress Notes (Signed)
eLink Physician-Brief Progress Note Patient Name: Jack Vance DOB: 01/21/1941 MRN: [id number]   Date of Service  09/30/2020  HPI/Events of Note  Hypokalemia - K+ = 2.4 and Creatinine = 2.92. Patient is on a Lasix IV infusion.  eICU Interventions  Will replace K+. Repeat BMP already ordered for 5 PM.      Intervention Category Major Interventions: Electrolyte abnormality - evaluation and management  [name] 09/30/2020, 5:51 AM

## 2020-09-30 NOTE — Progress Notes (Signed)
Peripherally Inserted Central Catheter Placement  The IV Nurse has discussed with the patient and/or persons authorized to consent for the patient, the purpose of this procedure and the potential benefits and risks involved with this procedure.  The benefits include less needle sticks, lab draws from the catheter, and the patient may be discharged home with the catheter. Risks include, but not limited to, infection, bleeding, blood clot (thrombus formation), and puncture of an artery; nerve damage and irregular heartbeat and possibility to perform a PICC exchange if needed/ordered by physician.  Alternatives to this procedure were also discussed.  Bard Power PICC patient education guide, fact sheet on infection prevention and patient information card has been provided to patient /or left at bedside.    PICC Placement Documentation  PICC Triple Lumen 09/30/20 PICC Left Brachial 43 cm (Active)  Indication for Insertion or Continuance of Line Vasoactive infusions 09/30/20 1000  Site Assessment Clean;Dry;Intact 09/30/20 1000  Lumen #1 Status Flushed;Blood return noted 09/30/20 1000  Lumen #2 Status Flushed;Blood return noted 09/30/20 1000  Lumen #3 Status Flushed;Blood return noted 09/30/20 1000  Dressing Type Transparent 09/30/20 1000  Dressing Status Clean;Dry;Intact 09/30/20 1000  Antimicrobial disc in place? Yes 09/30/20 1000  Dressing Change Due 10/07/20 09/30/20 1000    Telephone Consent   Stacie Glaze [place] 09/30/2020, 10:33 AM

## 2020-09-30 NOTE — Progress Notes (Addendum)
Advanced Heart Failure Rounding Note   Subjective:    Underwent emergent CABG on 5/6 for critical LM disease (LIMA -> LAD, SVG -> OM, SVG -> RCA) 5/6 Cultures negative.  5/8 Possible aspiration. A fib --> started amio drip.  Given 2u PRBCs. Hypotensive in the evening and started on vasopressin + antibiotics.  5/9 IABP removed 5/10 NG placed >1.5 out  5/11 started neostigmime 5/12 Febrile 102  Sedated on vent. FiO2 40% .   Currently on milrinone 0.25. Norepi down to 1. Early AM Co-ox marginal at 59%.   Good diuresis on lasix drip at 8/hr. -6.7L in UOP yesterday w/ wt down 12 lb. Now below pre-op wt.  No CVP setup currently.  SCr improving 1.83 => 1.95=>2.3 =>3=> 3.3=>3.5=>2.9   K 2.4   Febrile overnight. mTemp 101.8. WBC 11. Remains on zosyn.   1 stool occurrence yesterday.   Objective:   Weight Range:  Vital Signs:   Temp:  [98.8 F (37.1 C)-101.8 F (38.8 C)] 100.2 F (37.9 C) (05/13 0700) Pulse Rate:  [66-89] 66 (05/13 0752) Resp:  [16-22] 22 (05/13 0752) BP: (86-186)/(53-72) 186/62 (05/13 0752) SpO2:  [85 %-100 %] 98 % (05/13 0752) Arterial Line BP: (64-198)/(40-107) 185/63 (05/13 0600) FiO2 (%):  [40 %] 40 % (05/13 0752) Weight:  [80.4 kg] 80.4 kg (05/13 0132) Last BM Date: 09/29/20  Weight change: Filed Weights   09/28/20 0500 09/29/20 0615 09/30/20 0132  Weight: 88.4 kg 85.8 kg 80.4 kg    Intake/Output:   Intake/Output Summary (Last 24 hours) at 09/30/2020 0812 Last data filed at 09/30/2020 0700 Gross per 24 hour  Intake 2980.32 ml  Output 6301 ml  Net -3320.68 ml     PHYSICAL EXAM: General:  Intubated and sedated  HEENT: normal + ETT Neck: supple. + transducer in Rt IJ. Carotids 2+ bilat; no bruits. No lymphadenopathy or thyromegaly appreciated. Cor: PMI nondisplaced. Regular rate & rhythm. No rubs, gallops or murmurs. Lungs: intubated and clear Abdomen: soft, nontender, nondistended. No hepatosplenomegaly. + large ventral hernia easily  reducible. Good bowel sounds. Extremities: no cyanosis, clubbing, rash, edema Neuro: intubated and sedated, grimaces to painful stimuli  GU : + foley    Telemetry: ? SR with frequent PVCs. 12 lead EKG pending   Labs: Basic Metabolic Panel: Recent Labs  Lab 09/24/20 1516 09/24/20 2002 09/25/20 0355 09/25/20 0402 09/25/20 1609 09/25/20 1739 09/27/20 0518 09/27/20 1720 09/28/20 0542 09/28/20 1656 09/29/20 0602 09/30/20 0337  NA 135   < > 135   < > 132*   < > 132* 135 138 139 140 144  K 3.8   < > 3.3*   < > 3.9   < > 3.9 3.5 3.1* 3.5 3.2* 2.4*  CL 107  --  105  --  103   < > 99 98 101 101 99 102  CO2 21*  --  21*  --  18*   < > 20* 21* _0 GLUCOSE 141*  --  141*  --  217*   < > 100* 96 96 114* 143* 195*  BUN 43*  --  49*  --  55*   < > 68* 73* 74* 75* 77* 80*  CREATININE 1.83*  --  1.95*  --  2.34*   < > 2.98* 3.22* 3.33* 3.37* 3.46* 2.92*  CALCIUM 7.9*  --  7.5*  --  7.2*   < > 7.3* 7.4* 7.8* 8.0* 7.9* 8.4*  MG 3.3*  --  3.1*  --  2.7*   < > 2.4 2.4 2.5*  --  2.2 2.4  PHOS 5.1*  --  4.4  --  3.5  --   --   --   --   --  6.7* 4.4   < > = values in this interval not displayed.    Liver Function Tests: Recent Labs  Lab 09/26/20 0413 09/29/20 0602 09/30/20 0337  AST 54* 29 29  ALT _0 ALKPHOS 49 114 117  BILITOT 0.6 1.1 0.8  PROT 4.5* 5.1* 5.3*  ALBUMIN 1.9* 1.6* 1.6*   No results for input(s): LIPASE, AMYLASE in the last 168 hours. No results for input(s): AMMONIA in the last 168 hours.  CBC: Recent Labs  Lab 09/26/20 0413 09/26/20 0539 09/27/20 0928 09/28/20 0542 09/29/20 0602 09/30/20 0337  WBC 5.4  --  10.9* 9.5 11.1* 11.0*  NEUTROABS  --   --   --   --   --  9.8*  HGB 10.2* 10.5* 8.6* 9.2* 9.2* 9.2*  HCT 30.8* 31.0* 25.7* 26.5* 27.9* 28.2*  MCV 82.6  --  81.3 78.6* 81.8 81.5  PLT 61*  --  80* 93* 95* 90*    Cardiac Enzymes: No results for input(s): CKTOTAL, CKMB, CKMBINDEX, TROPONINI in the last 168 hours.  BNP: BNP (last 3  results) Recent Labs    09/23/20 0311  BNP 1,191.8*    ProBNP (last 3 results) No results for input(s): PROBNP in the last 8760 hours.    Other results:  Imaging: DG Abd Portable 1V  Result Date: 09/29/2020 CLINICAL DATA:  Enteric tube placement. EXAM: PORTABLE ABDOMEN - 1 VIEW COMPARISON:  Abdominal radiograph dated 09/28/2020. FINDINGS: An enteric tube terminates in the stomach near the pylorus. A feeding tube terminates in the distal duodenum/proximal jejunum, unchanged in positioning accounting for patient rotation. IMPRESSION: Enteric tube terminating in the stomach near the pylorus. Feeding tube not significantly changed in position. Electronically Signed   By: Zerita Boers M.D.   On: 09/29/2020 12:18   DG Abd Portable 1V  Result Date: 09/28/2020 CLINICAL DATA:  Feeding tube placement EXAM: PORTABLE ABDOMEN - 1 VIEW COMPARISON:  Portable exam 1617 hours compared to 1002 hours FINDINGS: Patient is severely rotated to the RIGHT. Feeding tube appears to traverse the stomach and duodenum with tip potentially at ligament of Treitz position, suboptimally assessed due to rotation. Visualized bowel gas pattern normal. Tip of nasogastric tube in stomach. IMPRESSION: Significant patient rotation to RIGHT limits exam. Tip of feeding tube is probably at the region of ligament of Treitz. Electronically Signed   By: Lavonia Dana M.D.   On: 09/28/2020 16:38   DG Abd Portable 1V  Result Date: 09/28/2020 CLINICAL DATA:  Recent surgery with nasogastric and feeding tubes present EXAM: PORTABLE ABDOMEN - 1 VIEW COMPARISON:  Sep 27, 2020 FINDINGS: Nasogastric tube tip and side port in stomach. Feeding tube tip is at the pylorus level. There is fluid throughout much of the bowel. There is no bowel dilatation or air-fluid level to suggest bowel obstruction. No free air evident on supine examination. IMPRESSION: Enteric tubes as noted. Most bowel loops are fluid-filled. While this finding can be seen  normally, it raises question of a degree of ileus or enteritis. Bowel obstruction not felt to be likely. No free air evident on supine examination. Electronically Signed   By: Lowella Grip III M.D.   On: 09/28/2020 11:41   VAS [place] LOWER EXTREMITY VENOUS (DVT)  Result Date: 09/29/2020  Lower Venous DVT Study Patient Name:  KRYSTAL DELDUCA [facility]  Date of Exam:   09/29/2020 Medical Rec #: [id number]      Accession #:    [id number] Date of Birth: March 13, 1941     Patient Gender: M Patient Age:   79Y Exam [facility] Procedure:      VAS [place] LOWER EXTREMITY VENOUS (DVT) Referring Phys: [id number] AMY D CLEGG --------------------------------------------------------------------------------  Indications: Swelling.  Limitations: Bandaging right proximal thigh, s/p cath and poor ultrasound/tissue interface. Comparison Study: No prior studies. Performing Technologist: Darlin Coco RDMS,RVT  Examination Guidelines: A complete evaluation includes B-mode imaging, spectral Doppler, color Doppler, and power Doppler as needed of all accessible portions of each vessel. Bilateral testing is considered an integral part of a complete examination. Limited examinations for reoccurring indications may be performed as noted. The reflux portion of the exam is performed with the patient in reverse Trendelenburg.  +---------+---------------+---------+-----------+----------+-------------------+ RIGHT    CompressibilityPhasicitySpontaneityPropertiesThrombus Aging      +---------+---------------+---------+-----------+----------+-------------------+ CFV                                                   Unable to visualize                                                       due to bandaging    +---------+---------------+---------+-----------+----------+-------------------+ FV Prox  Full                                                              +---------+---------------+---------+-----------+----------+-------------------+ FV Mid   Full                                                             +---------+---------------+---------+-----------+----------+-------------------+ FV DistalFull           Yes      Yes                                      +---------+---------------+---------+-----------+----------+-------------------+ PFV                                                   Not visualized      +---------+---------------+---------+-----------+----------+-------------------+ POP      Full           Yes      Yes                                      +---------+---------------+---------+-----------+----------+-------------------+  PTV      Full                                                             +---------+---------------+---------+-----------+----------+-------------------+ PERO     Full                                                             +---------+---------------+---------+-----------+----------+-------------------+   +---------+---------------+---------+-----------+----------+-------------------+ LEFT     CompressibilityPhasicitySpontaneityPropertiesThrombus Aging      +---------+---------------+---------+-----------+----------+-------------------+ CFV      Full           Yes      Yes                                      +---------+---------------+---------+-----------+----------+-------------------+ SFJ      Full                                                             +---------+---------------+---------+-----------+----------+-------------------+ FV Prox  Full                                                             +---------+---------------+---------+-----------+----------+-------------------+ FV Mid   Full                                                             +---------+---------------+---------+-----------+----------+-------------------+ FV  DistalFull                                                             +---------+---------------+---------+-----------+----------+-------------------+ PFV      Full                                                             +---------+---------------+---------+-----------+----------+-------------------+ POP                     Yes      Yes                  Unable to compress,  limited patient                                                           mobility            +---------+---------------+---------+-----------+----------+-------------------+ PTV      Full                                                             +---------+---------------+---------+-----------+----------+-------------------+ PERO     Full                                                             +---------+---------------+---------+-----------+----------+-------------------+    Summary: RIGHT: - There is no evidence of deep vein thrombosis in the lower extremity. However, portions of this examination were limited- see technologist comments above.  - No cystic structure found in the popliteal fossa.  LEFT: - There is no evidence of deep vein thrombosis in the lower extremity. However, portions of this examination were limited- see technologist comments above.  - No cystic structure found in the popliteal fossa.  *See table(s) above for measurements and observations.    Preliminary    [place] EKG SITE RITE  Result Date: 09/30/2020 If Site Rite image not attached, placement could not be confirmed due to current cardiac rhythm.    Medications:     Scheduled Medications: . acetaminophen (TYLENOL) oral liquid 160 mg/5 mL  650 mg Per Tube Once   Or  . acetaminophen  650 mg Rectal Once  . aspirin  300 mg Rectal Daily  . bisacodyl  10 mg Rectal Daily  . chlorhexidine gluconate (MEDLINE KIT)  15 mL Mouth Rinse BID  . Chlorhexidine  Gluconate Cloth  6 each Topical Daily  . free water  30 mL Per Tube Q4H  . heparin injection (subcutaneous)  5,000 Units Subcutaneous Q8H  . insulin aspart  0-24 Units Subcutaneous Q4H  . ipratropium-albuterol  3 mL Nebulization Q6H  . mouth rinse  15 mL Mouth Rinse 10 times per day  . neostigmine  0.25 mg Subcutaneous Q6H  . pantoprazole (PROTONIX) IV  40 mg Intravenous Q24H  . sodium chloride flush  10-40 mL Intracatheter Q12H  . sodium chloride flush  3 mL Intravenous Q12H    Infusions: . amiodarone 30 mg/hr (09/30/20 0600)  . dexmedetomidine (PRECEDEX) IV infusion 0.7 mcg/kg/hr (09/30/20 0600)  . fentaNYL infusion INTRAVENOUS 125 mcg/hr (09/30/20 0600)  . furosemide (LASIX) 200 mg in dextrose 5% 100 mL ($Remov'2mg'pALMMh$ /mL) infusion 8 mg/hr (09/30/20 0600)  . lactated ringers 20 mL/hr at 09/28/20 1900  . lactated ringers 20 mL/hr at 09/30/20 0600  . milrinone 0.25 mcg/kg/min (09/30/20 0600)  . norepinephrine (LEVOPHED) Adult infusion 4 mcg/min (09/30/20 0600)  . piperacillin-tazobactam (ZOSYN)  IV 2.25 g (09/30/20 5188)  . potassium chloride 10 mEq (09/30/20 0719)  . TPN ADULT (ION) 45 mL/hr at 09/30/20 0600    PRN Medications: acetaminophen, albuterol,  fentaNYL (SUBLIMAZE) injection, metoprolol tartrate, midazolam, ondansetron (ZOFRAN) IV, sodium chloride flush, sodium chloride flush   Assessment/Plan:   1. 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  - hemodynamics ok. CO-OX marginal 59%.  - Continue milrinone 0.25  - Continue to wean norepi to maintain Map >70 - Volume status improving w/ lasix gtt. Now below pre-op wt. D/w RN. Will set up CVP  - GDMT limited by AKI   2. NSTEMI with critical CAD - 95% LM and high grade ostial RCA - CABG on 5/6 for critical LM disease (LIMA -> LAD, SVG -> OM, SVG -> RCA) - Continue  ASA/statin  - Consider Plavix now that IABP out but abdominal distention may need to wait.  - See above. Hgb stable at 9.2   3. Acute hypoxic respiratory failure due to pulmonary edema - on vent - Possible aspiration 5/8 & 5/10  - 5/6 Blood CX - NG - On antibiotics  - CCM following  4. AKI  - due to ATN - Cr 1.0 -> 1.7 -> 1.95-->2.3 ->3->3.3->3.5->2.9  - Support hemodynamics.   5. Ventral Hernia  5/10 NG placed to decompress   6. ID On antibiotic for possible aspiration.  Blood CX 5/6- NGTD TMax 101.8 WBC 11  7. Hypokalemia - K 2.4 w/ diuresis. Mg ok at 2.4  - aggressively supp K. D/w pharmD  - repeat BMP later today   D/w RN. Remove transducer from Rt IJ and place PICC line. Set up CVP monitoring and exchange foley.    Length of Stay: [place] PA-C  09/30/2020, 8:12 AM  Advanced Heart Failure Team Pager [phone] (M-F; 7a - 4p)  Please contact [facility] for night-coverage after hours (4p -7a ) and weekends on [email or web address]  Patient seen and examined with the above-signed Advanced Practice Provider and/or Housestaff. I personally reviewed laboratory data, imaging studies and relevant notes. I independently examined the patient and formulated the important aspects of the plan. I have edited the note to reflect any of my changes or salient points. I have personally discussed the plan with the patient and/or family.  Remains intubated. Febrile overnight. Excellent diuresis. Renal function slightly better. Had BM overnight. On TPN  On milrinone 0.25 co-ox 59%   General:  Sedated on vent HEENT: normal Neck: supple. no JVD. Carotids 2+ bilat; no bruits. No lymphadenopathy or thryomegaly appreciated. Cor: sternal wound ok Regular rate & rhythm. No rubs, gallops or murmurs. Lungs: coarse Abdomen: obese soft, nontender, nondistended. Large ventral hernia  Hypoactive bowel sounds. Extremities: no cyanosis, clubbing, rash, tr edema Neuro: intubated/sedated    He is making slow progress. Volume status much improved. Still with fevers possibly related to aspiration. Continue zosyn and TPN. Continue milrinone for now. Repeat CXR. Will repeat CXR. Hopefully we can get him extubated soon.   CRITICAL CARE Performed by: Glori Bickers  Total critical care time: 35 minutes  Critical care time was exclusive of separately billable procedures and treating other patients.  Critical care was necessary to treat or prevent imminent or life-threatening deterioration.  Critical care was time spent personally by me (independent of midlevel providers or residents) on the following activities: development of treatment plan with patient and/or surrogate as well as nursing, discussions with consultants, evaluation of patient's response to treatment, examination of patient, obtaining history from  patient or surrogate, ordering and performing treatments and interventions, ordering and review of laboratory studies, ordering and review of radiographic studies, pulse oximetry and re-evaluation of patient's condition.  Glori Bickers, MD  10:40 AM

## 2020-09-30 NOTE — Progress Notes (Signed)
[address]       [place] [id number]             [phone]                 7 Days Post-Op Procedure(s) (LRB): CORONARY ARTERY BYPASS GRAFTING (CABG)  TIMES 4 USING LEFT GREATER SAPHENOUS VEIN HARVESTED ENDOSCOPICALLY AND LEFT INTERNAL MAMMARY ARTERY (N/A) TRANSESOPHAGEAL ECHOCARDIOGRAM (TEE) (N/A)   Events: Fevers overnight  _______________________________________________________________ Vitals: BP (!) 187/69 (BP Location: Right Arm)   Pulse 66   Temp (!) 101.1 F (38.4 C) (Core)   Resp 18   Ht 5\' 6"  (1.676 m)   Wt 80.4 kg   SpO2 99%   BMI 28.61 kg/m   - Neuro: Sedated.  Precedex at 0.7, fentanyl at 125, Versed 2  - Cardiovascular: Normal sinus rhythm.  I  Drips:  milrinone at 0.25, Lasix at 8    - Pulm:  Vent Mode: PRVC FiO2 (%):  [40 %] 40 % Set Rate:  [22 bmp] 22 bmp Vt Set:  [560 mL] 560 mL PEEP:  [5 cmH20] 5 cmH20 Plateau Pressure:  [20 cmH20-21 cmH20] 21 cmH20  ABG    Component Value Date/Time   PHART 7.410 09/26/2020 0539   PCO2ART 31.1 (L) 09/26/2020 0539   PO2ART 136 (H) 09/26/2020 0539   HCO3 19.9 (L) 09/26/2020 0539   TCO2 21 (L) 09/26/2020 0539   ACIDBASEDEF 4.0 (H) 09/26/2020 0539   O2SAT 58.5 09/30/2020 0337    - Abd: Distended, ventral hernia  - Extremity: Warm  .Intake/Output      05/12 0701 05/13 0700 05/13 0701 05/14 0700   I.V. (mL/kg) 3602.7 (44.8) 231.7 (2.9)   NG/GT 60 120   IV Piggyback 550 133.7   Total Intake(mL/kg) 4212.7 (52.4) 485.3 (6)   Urine (mL/kg/hr) 6700 (3.5)    Emesis/NG output 500    Stool 1    Total Output 7201    Net -2988.3 +485.3        Stool Occurrence 1 x       _______________________________________________________________ Labs: CBC Latest Ref Rng & Units 09/30/2020 09/29/2020 09/28/2020  WBC 4.0 - 10.5 K/uL 11.0(H) 11.1(H) 9.5  Hemoglobin 13.0 - 17.0 g/dL 11/28/2020) 2.4(M) 0.1(U)  Hematocrit 39.0 - 52.0 % 28.2(L) 27.9(L) 26.5(L)  Platelets 150 - 400 K/uL 90(L) 95(L) 93(L)    CMP Latest Ref Rng & Units 09/30/2020 09/29/2020 09/28/2020  Glucose 70 - 99 mg/dL 11/28/2020) 536(U) 440(H)  BUN 8 - 23 mg/dL 474(Q) 59(D) 63(O)  Creatinine 0.61 - 1.24 mg/dL 75(I) 4.33(I) 9.51(O)  Sodium 135 - 145 mmol/L 144 140 139  Potassium 3.5 - 5.1 mmol/L 2.4(LL) 3.2(L) 3.5  Chloride 98 - 111 mmol/L 102 99 101  CO2 22 - 32 mmol/L 29 23 22   Calcium 8.9 - 10.3 mg/dL 8.41(Y) 7.9(L) 8.0(L)  Total Protein 6.5 - 8.1 g/dL 5.3(L) 5.1(L) -  Total Bilirubin 0.3 - 1.2 mg/dL 0.8 1.1 -  Alkaline Phos 38 - 126 U/L 117 114 -  AST 15 - 41 U/L 29 29 -  ALT 0 - 44 U/L 7 9 -    CXR: pending   _______________________________________________________________  Assessment and Plan: POD 7 s/p emergency CABG.  Neuro: Wean sedation as tolerated. CV: Off levo, on milr.  Will wean as tolerted Pulm: Wean vent as tolerated.  Good respiratory mechanics. Renal: creat trending down.  Still making good urine on lasix gtt GI: Tube feeds on hold.  Ileus, continue  gastric decompression. On TPN Heme: Stable ID: DVT studies negative.  On Zosyn.  Will remove all lines Endo: Sliding scale insulin. Dispo: Continue ICU care.   Corliss Skains 09/30/2020 8:45 AM

## 2020-10-01 DIAGNOSIS — R570 Cardiogenic shock: Secondary | ICD-10-CM | POA: Diagnosis not present

## 2020-10-01 DIAGNOSIS — Z951 Presence of aortocoronary bypass graft: Secondary | ICD-10-CM | POA: Diagnosis not present

## 2020-10-01 DIAGNOSIS — I5021 Acute systolic (congestive) heart failure: Secondary | ICD-10-CM | POA: Diagnosis not present

## 2020-10-01 LAB — GLUCOSE, CAPILLARY
Glucose-Capillary: 150 mg/dL — ABNORMAL HIGH (ref 70–99)
Glucose-Capillary: 156 mg/dL — ABNORMAL HIGH (ref 70–99)
Glucose-Capillary: 171 mg/dL — ABNORMAL HIGH (ref 70–99)
Glucose-Capillary: 173 mg/dL — ABNORMAL HIGH (ref 70–99)
Glucose-Capillary: 176 mg/dL — ABNORMAL HIGH (ref 70–99)
Glucose-Capillary: 184 mg/dL — ABNORMAL HIGH (ref 70–99)
Glucose-Capillary: 209 mg/dL — ABNORMAL HIGH (ref 70–99)

## 2020-10-01 LAB — BASIC METABOLIC PANEL
Anion gap: 13 (ref 5–15)
Anion gap: 9 (ref 5–15)
BUN: 84 mg/dL — ABNORMAL HIGH (ref 8–23)
BUN: 85 mg/dL — ABNORMAL HIGH (ref 8–23)
CO2: 31 mmol/L (ref 22–32)
CO2: 34 mmol/L — ABNORMAL HIGH (ref 22–32)
Calcium: 8.3 mg/dL — ABNORMAL LOW (ref 8.9–10.3)
Calcium: 8.5 mg/dL — ABNORMAL LOW (ref 8.9–10.3)
Chloride: 98 mmol/L (ref 98–111)
Chloride: 100 mmol/L (ref 98–111)
Creatinine, Ser: 2.71 mg/dL — ABNORMAL HIGH (ref 0.61–1.24)
Creatinine, Ser: 2.58 mg/dL — ABNORMAL HIGH (ref 0.61–1.24)
GFR, Estimated: 23 mL/min — ABNORMAL LOW (ref >60)
GFR, Estimated: 25 mL/min — ABNORMAL LOW (ref >60)
Glucose, Bld: 182 mg/dL — ABNORMAL HIGH (ref 70–99)
Glucose, Bld: 174 mg/dL — ABNORMAL HIGH (ref 70–99)
Potassium: 3 mmol/L — ABNORMAL LOW (ref 3.5–5.1)
Potassium: 3.4 mmol/L — ABNORMAL LOW (ref 3.5–5.1)
Sodium: 142 mmol/L (ref 135–145)
Sodium: 143 mmol/L (ref 135–145)

## 2020-10-01 LAB — CBC
HCT: 28 % — ABNORMAL LOW (ref 39.0–52.0)
Hemoglobin: 8.9 g/dL — ABNORMAL LOW (ref 13.0–17.0)
MCH: 26.5 pg (ref 26.0–34.0)
MCHC: 31.8 g/dL (ref 30.0–36.0)
MCV: 83.3 fL (ref 80.0–100.0)
Platelets: 109 10*3/uL — ABNORMAL LOW (ref 150–400)
RBC: 3.36 MIL/uL — ABNORMAL LOW (ref 4.22–5.81)
RDW: 18.2 % — ABNORMAL HIGH (ref 11.5–15.5)
WBC: 11.9 10*3/uL — ABNORMAL HIGH (ref 4.0–10.5)
nRBC: 0 % (ref 0.0–0.2)

## 2020-10-01 LAB — MAGNESIUM: Magnesium: 2.1 mg/dL (ref 1.7–2.4)

## 2020-10-01 LAB — COOXEMETRY PANEL
Carboxyhemoglobin: 1 % (ref 0.5–1.5)
Methemoglobin: 0.9 % (ref 0.0–1.5)
O2 Saturation: 69.8 %
Total hemoglobin: 10.7 g/dL — ABNORMAL LOW (ref 12.0–16.0)

## 2020-10-01 LAB — URINE CULTURE: Culture: NO GROWTH

## 2020-10-01 LAB — MRSA PCR SCREENING: MRSA by PCR: NEGATIVE

## 2020-10-01 LAB — POTASSIUM: Potassium: 3.2 mmol/L — ABNORMAL LOW (ref 3.5–5.1)

## 2020-10-01 MED ORDER — HYDRALAZINE HCL 25 MG PO TABS
25.0000 mg | ORAL_TABLET | Freq: Three times a day (TID) | ORAL | Status: DC
  Administered 2020-10-01 – 2020-10-12 (×31): 25 mg
  Filled 2020-10-01 (×31): qty 1

## 2020-10-01 MED ORDER — TRAVASOL 10 % IV SOLN
INTRAVENOUS | Status: AC
  Filled 2020-10-01: qty 1368

## 2020-10-01 MED ORDER — HYDRALAZINE HCL 25 MG PO TABS: 25.0000 mg | ORAL_TABLET | Freq: Three times a day (TID) | ORAL | Status: DC

## 2020-10-01 MED ORDER — CHLORHEXIDINE GLUCONATE 0.12 % MT SOLN
OROMUCOSAL | Status: AC
  Administered 2020-10-01: 15 mL via OROMUCOSAL
  Filled 2020-10-01: qty 15

## 2020-10-01 MED ORDER — PIVOT 1.5 CAL PO LIQD: 1000.0000 mL | ORAL | Status: DC

## 2020-10-01 MED ORDER — POTASSIUM CHLORIDE 10 MEQ/50ML IV SOLN: 10.0000 meq | INTRAVENOUS | Status: DC

## 2020-10-01 MED ORDER — POTASSIUM CHLORIDE 10 MEQ/50ML IV SOLN
10.0000 meq | INTRAVENOUS | Status: AC
  Administered 2020-10-01 (×4): 10 meq via INTRAVENOUS
  Filled 2020-10-01 (×4): qty 50

## 2020-10-01 MED ORDER — POTASSIUM CHLORIDE 10 MEQ/50ML IV SOLN
10.0000 meq | INTRAVENOUS | Status: AC
  Administered 2020-10-01 (×3): 10 meq via INTRAVENOUS
  Filled 2020-10-01: qty 50

## 2020-10-01 MED ORDER — POTASSIUM CHLORIDE 20 MEQ PO PACK
40.0000 meq | PACK | Freq: Once | ORAL | Status: AC
  Administered 2020-10-01: 40 meq
  Filled 2020-10-01: qty 2

## 2020-10-01 MED ORDER — PIVOT 1.5 CAL PO LIQD
1000.0000 mL | ORAL | Status: DC
  Administered 2020-10-01 – 2020-10-02 (×2): 1000 mL

## 2020-10-01 MED ORDER — ISOSORBIDE DINITRATE 10 MG PO TABS
10.0000 mg | ORAL_TABLET | Freq: Three times a day (TID) | ORAL | Status: DC
  Administered 2020-10-01 – 2020-10-20 (×58): 10 mg
  Filled 2020-10-01 (×58): qty 1

## 2020-10-01 NOTE — Progress Notes (Signed)
PHARMACY - TOTAL PARENTERAL NUTRITION CONSULT NOTE   Indication: Prolonged ileus  Patient Measurements: Height: 5\' 6"  (167.6 cm) Weight: 77.8 kg (171 lb 8.3 oz) IBW/kg (Calculated) : 63.8 TPN AdjBW (KG): 68.6 Body mass index is 27.68 kg/m.  Assessment: 89 yom presenting with cardiogenic shock secondary to NSTEMI, s/p urgent cath revealing 3V CAD and subsequent emergent CABG. Patient with ileus post-op, prolonged NPO status. Cortrak placed 5/11. Trickle tube were attempted x 2 (5/7-5/8, 5/9-5/11) and held both times with vomiting. Pharmacy consulted to start TPN.  Patient currently intubated, sedated in the ICU on fentanyl/Precedex/Versed, continuing on support with milrinone and norepinephrine. Also on amiodarone drip per TCTS.  Glucose / Insulin: No previous hx DM. A1c 6.3% on 09/25/20. CBGs mostly <180. Insulin drip off, utilized 18 units SSI in last 24hrs Electrolytes: K 3 (received 120 meq yesterday; goal >/=4 with ileus), CoCa 10.2; others WNL Renal: AKI - SCr down to 2.71 (SCr 1 on 5/6), BUN up to 84. Lasix drip at 8 mg/hr (to stop today), net -4.8L Hepatic: LFTs / Tbili WNL, TG up 216. Albumin 1.6, prealbumin 7.2 Intake / Output; MIVF: UOP 313 ml/kg/hr, NGT output down 150 ml/24hrs, +BM s/p SMOG enema 5/12  GI Imaging: none since TPN start GI Surgeries / Procedures: none since TPN start  Central access: CVC triple lumen placed 09/24/20 (removed 5/13); triple lumen PICC placed 09/30/20 TPN start date: 09/29/20  Nutritional Goals (per RD recommendation on 09/29/20): kCal: 11/29/20, Protein: 135-180g, Fluid: >/=2 L/day  Goal TPN rate is 95 mL/hr (provides 137 g of protein and 2178 kcals per day)  Current Nutrition:  NPO and TPN   Plan:  Unable to concentrate TPN due to national shortage of Clinisol.  Increase TPN to goal rate of 95 mL/hr at 1800.  TPN will provide 137g protein and 2178 kCal, meeting 100% of patient needs Electrolytes in TPN: continue standard lytes except  reduced Phos/Ca - Na 52mEq/L, K 44mEq/L, decrease Ca 3 mEq/L, Mg 58mEq/L, and reduced Phos to 5 mmol/L. Change to Cl:Ac 2:1 (all lytes increased with rate increase of TPN) K runs x 4 already ordered per provider, will add 3 additional K runs Add standard MVI and trace elements + thiamine and folic acid per dietitian recommendation to TPN Continue TCTS q4h SSI for now and adjust as needed Monitor TPN labs, for possible refeeding F/u plans for extubation, toleration of TF/diet and ability to wean TPN  Thank you for involving pharmacy in this patient's care.  4m, PharmD, BCPS Clinical Pharmacist Clinical phone for 10/01/2020 until 3p is 10/03/2020 10/01/2020 7:04 AM  **Pharmacist phone directory can be found on [email or web address] listed under [facility]**

## 2020-10-01 NOTE — Progress Notes (Signed)
8 Days Post-Op Procedure(s) (LRB): CORONARY ARTERY BYPASS GRAFTING (CABG)  TIMES 4 USING LEFT GREATER SAPHENOUS VEIN HARVESTED ENDOSCOPICALLY AND LEFT INTERNAL MAMMARY ARTERY (N/A) TRANSESOPHAGEAL ECHOCARDIOGRAM (TEE) (N/A) Subjective: sedated  Objective: Vital signs in last 24 hours: Temp:  [98.3 F (36.8 C)-99.8 F (37.7 C)] 98.3 F (36.8 C) (05/14 0738) Pulse Rate:  [80-114] 97 (05/14 0357) Cardiac Rhythm: Normal sinus rhythm (05/14 0800) Resp:  [0-23] 0 (05/14 1000) BP: (98-187)/(44-83) 167/70 (05/14 1000) SpO2:  [95 %-99 %] 96 % (05/14 1000) Arterial Line BP: (121-205)/(26-70) 197/70 (05/14 1000) FiO2 (%):  [40 %] 40 % (05/14 0800) Weight:  [77.8 kg] 77.8 kg (05/14 0550)  Hemodynamic parameters for last 24 hours: CVP:  [3 mmHg-7 mmHg] 3 mmHg  Intake/Output from previous day: 05/13 0701 - 05/14 0700 In: 4138.7 [I.V.:2528.5; NG/GT:270; IV Piggyback:1340.2] Out: 5775 [Urine:5625; Emesis/NG output:150] Intake/Output this shift: Total I/O In: 562 [I.V.:287.1; NG/GT:90; IV Piggyback:185] Out: 1025 [Urine:1025]  Sedated CTA RRR Ext: mild edema   Lab Results: Recent Labs    09/30/20 0337 10/01/20 0319  WBC 11.0* 11.9*  HGB 9.2* 8.9*  HCT 28.2* 28.0*  PLT 90* 109*   BMET:  Recent Labs    09/30/20 1556 10/01/20 0015 10/01/20 0319  NA 142  --  142  K 3.0* 3.2* 3.0*  CL 100  --  98  CO2 30  --  31  GLUCOSE 162*  --  182*  BUN 80*  --  84*  CREATININE 2.76*  --  2.71*  CALCIUM 8.2*  --  8.3*    PT/INR: No results for input(s): LABPROT, INR in the last 72 hours. ABG    Component Value Date/Time   PHART 7.410 09/26/2020 0539   HCO3 19.9 (L) 09/26/2020 0539   TCO2 21 (L) 09/26/2020 0539   ACIDBASEDEF 4.0 (H) 09/26/2020 0539   O2SAT 69.8 10/01/2020 0422   CBG (last 3)  Recent Labs    10/01/20 0013 10/01/20 0409 10/01/20 0726  GLUCAP 156* 184* 150*    Assessment/Plan: S/P Procedure(s) (LRB): CORONARY ARTERY BYPASS GRAFTING (CABG)  TIMES 4 USING  LEFT GREATER SAPHENOUS VEIN HARVESTED ENDOSCOPICALLY AND LEFT INTERNAL MAMMARY ARTERY (N/A) TRANSESOPHAGEAL ECHOCARDIOGRAM (TEE) (N/A) extubate vs. trach   LOS: 8 days    Jack Vance 10/01/2020

## 2020-10-01 NOTE — Progress Notes (Signed)
eLink Physician-Brief Progress Note Patient Name: Jack Vance DOB: 05/07/1941 MRN: [id number]   Date of Service  10/01/2020  HPI/Events of Note  Hypokalemia - K+ = 3.2 and Creatinine = 2.76. Patient is a Lasix IV infusion.   eICU Interventions  Plan: 1. Will replace K+.     Intervention Category Major Interventions: Electrolyte abnormality - evaluation and management  Vendela Troung Eugene 10/01/2020, 3:14 AM

## 2020-10-01 NOTE — Progress Notes (Addendum)
Advanced Heart Failure Rounding Note   Subjective:    Underwent emergent CABG on 5/6 for critical LM disease (LIMA -> LAD, SVG -> OM, SVG -> RCA) 5/6 Cultures negative.  5/8 Possible aspiration. A fib --> started amio drip.  Given 2u PRBCs. Hypotensive in the evening and started on vasopressin + antibiotics.  5/9 IABP removed 5/10 NG placed >1.5 out  5/11 started neostigmime 5/12 Febrile 102  Remains on vent. FiO2 40%. Sedation off except for low-dose Precedex. Tolerating SBT. Following some commands  Remains on milrinone 0.25. Norepi off.  Co-ox 70%  On TPN. Also on lasix gtt at 8/hr. Creatinine stable at 2.7. Weight down another 6 pounds. 12 pounds below baseline.   On zosyn Tm 99.8  Objective:   Weight Range:  Vital Signs:   Temp:  [98.3 F (36.8 C)-99.8 F (37.7 C)] 98.3 F (36.8 C) (05/14 0738) Pulse Rate:  [80-114] 97 (05/14 0357) Resp:  [17-23] 19 (05/14 0800) BP: (98-187)/(44-83) 158/70 (05/14 0800) SpO2:  [95 %-100 %] 98 % (05/14 0800) Arterial Line BP: (121-205)/(26-68) 201/68 (05/14 0800) FiO2 (%):  [40 %] 40 % (05/14 0800) Weight:  [77.8 kg] 77.8 kg (05/14 0550) Last BM Date: 09/29/20  Weight change: Filed Weights   09/29/20 0615 09/30/20 0132 10/01/20 0550  Weight: 85.8 kg 80.4 kg 77.8 kg    Intake/Output:   Intake/Output Summary (Last 24 hours) at 10/01/2020 0850 Last data filed at 10/01/2020 0800 Gross per 24 hour  Intake 3870.99 ml  Output 6075 ml  Net -2204.01 ml     PHYSICAL EXAM: General: On vent. Arousable following some commands HEENT: normal + ETT Neck: supple. no JVD. Carotids 2+ bilat; no bruits. No lymphadenopathy or thryomegaly appreciated. Cor: Sternal wound ok. PMI nondisplaced. Regular rate & rhythm. No rubs, gallops or murmurs. Lungs: coarse  Abdomen: soft, nontender, nondistended. Large ventral hernia  Extremities: no cyanosis, clubbing, rash, edema Neuro: on vent    Telemetry: Sinus 90s Personally  reviewed  Labs: Basic Metabolic Panel: Recent Labs  Lab 09/24/20 1516 09/24/20 2002 09/25/20 0355 09/25/20 0402 09/25/20 1609 09/25/20 1739 09/27/20 0518 09/27/20 1720 09/28/20 0542 09/28/20 1656 09/29/20 0602 09/30/20 0337 09/30/20 1556 10/01/20 0015 10/01/20 0319  NA 135   < > 135   < > 132*   < > 132* 135 138 139 140 144 142  --  142  K 3.8   < > 3.3*   < > 3.9   < > 3.9 3.5 3.1* 3.5 3.2* 2.4* 3.0* 3.2* 3.0*  CL 107  --  105  --  103   < > 99 98 101 101 99 102 100  --  98  CO2 21*  --  21*  --  18*   < > 20* 21* $Remov'23 22 23 29 30  'jEmytE$ --  31  GLUCOSE 141*  --  141*  --  217*   < > 100* 96 96 114* 143* 195* 162*  --  182*  BUN 43*  --  49*  --  55*   < > 68* 73* 74* 75* 77* 80* 80*  --  84*  CREATININE 1.83*  --  1.95*  --  2.34*   < > 2.98* 3.22* 3.33* 3.37* 3.46* 2.92* 2.76*  --  2.71*  CALCIUM 7.9*  --  7.5*  --  7.2*   < > 7.3* 7.4* 7.8* 8.0* 7.9* 8.4* 8.2*  --  8.3*  MG 3.3*  --  3.1*  --  2.7*   < > 2.4 2.4 2.5*  --  2.2 2.4  --   --   --   PHOS 5.1*  --  4.4  --  3.5  --   --   --   --   --  6.7* 4.4  --   --   --    < > = values in this interval not displayed.    Liver Function Tests: Recent Labs  Lab 09/26/20 0413 09/29/20 0602 09/30/20 0337  AST 54* 29 29  ALT $Re'9 9 7  'kSB$ ALKPHOS 49 114 117  BILITOT 0.6 1.1 0.8  PROT 4.5* 5.1* 5.3*  ALBUMIN 1.9* 1.6* 1.6*   No results for input(s): LIPASE, AMYLASE in the last 168 hours. No results for input(s): AMMONIA in the last 168 hours.  CBC: Recent Labs  Lab 09/27/20 0928 09/28/20 0542 09/29/20 0602 09/30/20 0337 10/01/20 0319  WBC 10.9* 9.5 11.1* 11.0* 11.9*  NEUTROABS  --   --   --  9.8*  --   HGB 8.6* 9.2* 9.2* 9.2* 8.9*  HCT 25.7* 26.5* 27.9* 28.2* 28.0*  MCV 81.3 78.6* 81.8 81.5 83.3  PLT 80* 93* 95* 90* 109*    Cardiac Enzymes: No results for input(s): CKTOTAL, CKMB, CKMBINDEX, TROPONINI in the last 168 hours.  BNP: BNP (last 3 results) Recent Labs    09/23/20 0311  BNP 1,191.8*    ProBNP (last  3 results) No results for input(s): PROBNP in the last 8760 hours.    Other results:  Imaging: DG CHEST PORT 1 VIEW  Result Date: 09/30/2020 CLINICAL DATA:  CABG, respiratory dependent.  Pleural effusions. EXAM: PORTABLE CHEST 1 VIEW COMPARISON:  Chest x-ray dated 09/28/2020 and 09/27/2020. FINDINGS: Heart size and mediastinal contours are stable. RIGHT IJ Cordis has been removed. Endotracheal tube and enteric tube appear appropriately positioned. LEFT-sided PICC line has been placed and appears well positioned with tip at the level of the lower SVC. Probable mild interstitial edema. Probable small bilateral pleural effusions and/or mild bibasilar atelectasis. IMPRESSION: 1. Probable small bilateral pleural effusions and/or mild bibasilar atelectasis. 2. Probable mild interstitial edema. 3. Support apparatus appears well positioned. Electronically Signed   By: Franki Cabot M.D.   On: 09/30/2020 15:25   DG Abd Portable 1V  Result Date: 09/29/2020 CLINICAL DATA:  Enteric tube placement. EXAM: PORTABLE ABDOMEN - 1 VIEW COMPARISON:  Abdominal radiograph dated 09/28/2020. FINDINGS: An enteric tube terminates in the stomach near the pylorus. A feeding tube terminates in the distal duodenum/proximal jejunum, unchanged in positioning accounting for patient rotation. IMPRESSION: Enteric tube terminating in the stomach near the pylorus. Feeding tube not significantly changed in position. Electronically Signed   By: Zerita Boers M.D.   On: 09/29/2020 12:18   VAS [place] LOWER EXTREMITY VENOUS (DVT)  Result Date: 09/30/2020  Lower Venous DVT Study Patient Name:  TREA LATNER [facility]  Date of Exam:   09/29/2020 Medical Rec #: [id number]      Accession #:    [id number] Date of Birth: 11-21-1940     Patient Gender: M Patient Age:   79Y Exam Location:  [facility] Procedure:      VAS [place] LOWER EXTREMITY VENOUS (DVT) Referring Phys: [id number] AMY D CLEGG  --------------------------------------------------------------------------------  Indications: Swelling.  Limitations: Bandaging right proximal thigh, s/p cath and poor ultrasound/tissue interface. Comparison Study: No prior studies. Performing Technologist: Darlin Coco RDMS,RVT  Examination Guidelines: A complete evaluation includes B-mode imaging, spectral Doppler, color Doppler, and power Doppler as  needed of all accessible portions of each vessel. Bilateral testing is considered an integral part of a complete examination. Limited examinations for reoccurring indications may be performed as noted. The reflux portion of the exam is performed with the patient in reverse Trendelenburg.  +---------+---------------+---------+-----------+----------+-------------------+ RIGHT    CompressibilityPhasicitySpontaneityPropertiesThrombus Aging      +---------+---------------+---------+-----------+----------+-------------------+ CFV                                                   Unable to visualize                                                       due to bandaging    +---------+---------------+---------+-----------+----------+-------------------+ FV Prox  Full                                                             +---------+---------------+---------+-----------+----------+-------------------+ FV Mid   Full                                                             +---------+---------------+---------+-----------+----------+-------------------+ FV DistalFull           Yes      Yes                                      +---------+---------------+---------+-----------+----------+-------------------+ PFV                                                   Not visualized      +---------+---------------+---------+-----------+----------+-------------------+ POP      Full           Yes      Yes                                       +---------+---------------+---------+-----------+----------+-------------------+ PTV      Full                                                             +---------+---------------+---------+-----------+----------+-------------------+ PERO     Full                                                             +---------+---------------+---------+-----------+----------+-------------------+   +---------+---------------+---------+-----------+----------+-------------------+  LEFT     CompressibilityPhasicitySpontaneityPropertiesThrombus Aging      +---------+---------------+---------+-----------+----------+-------------------+ CFV      Full           Yes      Yes                                      +---------+---------------+---------+-----------+----------+-------------------+ SFJ      Full                                                             +---------+---------------+---------+-----------+----------+-------------------+ FV Prox  Full                                                             +---------+---------------+---------+-----------+----------+-------------------+ FV Mid   Full                                                             +---------+---------------+---------+-----------+----------+-------------------+ FV DistalFull                                                             +---------+---------------+---------+-----------+----------+-------------------+ PFV      Full                                                             +---------+---------------+---------+-----------+----------+-------------------+ POP                     Yes      Yes                  Unable to compress,                                                       limited patient                                                           mobility            +---------+---------------+---------+-----------+----------+-------------------+  PTV      Full                                                             +---------+---------------+---------+-----------+----------+-------------------+  PERO     Full                                                             +---------+---------------+---------+-----------+----------+-------------------+     Summary: RIGHT: - There is no evidence of deep vein thrombosis in the lower extremity. However, portions of this examination were limited- see technologist comments above.  - No cystic structure found in the popliteal fossa.  LEFT: - There is no evidence of deep vein thrombosis in the lower extremity. However, portions of this examination were limited- see technologist comments above.  - No cystic structure found in the popliteal fossa.  *See table(s) above for measurements and observations. Electronically signed by Jamelle Haring on 09/30/2020 at 10:37:57 AM.    Final    [place] EKG SITE RITE  Result Date: 09/30/2020 If Site Rite image not attached, placement could not be confirmed due to current cardiac rhythm.    Medications:     Scheduled Medications: . aspirin  300 mg Rectal Daily  . bisacodyl  10 mg Rectal Daily  . chlorhexidine gluconate (MEDLINE KIT)  15 mL Mouth Rinse BID  . Chlorhexidine Gluconate Cloth  6 each Topical Daily  . free water  30 mL Per Tube Q4H  . heparin injection (subcutaneous)  5,000 Units Subcutaneous Q8H  . insulin aspart  0-24 Units Subcutaneous Q4H  . ipratropium-albuterol  3 mL Nebulization Q6H  . mouth rinse  15 mL Mouth Rinse 10 times per day  . metoCLOPramide (REGLAN) injection  5 mg Intravenous Q6H  . neostigmine  0.25 mg Subcutaneous Q6H  . pantoprazole (PROTONIX) IV  40 mg Intravenous Q24H  . sodium chloride flush  10-40 mL Intracatheter Q12H  . sodium chloride flush  3 mL Intravenous Q12H    Infusions: . amiodarone 30 mg/hr (10/01/20 0800)  . dexmedetomidine (PRECEDEX) IV infusion 0.3 mcg/kg/hr (10/01/20 0701)  . fentaNYL infusion  INTRAVENOUS Stopped (10/01/20 0024)  . furosemide (LASIX) 200 mg in dextrose 5% 100 mL ($Remov'2mg'KNpXnW$ /mL) infusion 8 mg/hr (10/01/20 0800)  . lactated ringers 20 mL/hr at 09/28/20 1900  . lactated ringers Stopped (10/01/20 5929)  . milrinone 0.25 mcg/kg/min (10/01/20 0800)  . norepinephrine (LEVOPHED) Adult infusion Stopped (09/30/20 0826)  . piperacillin-tazobactam (ZOSYN)  IV 12.5 mL/hr at 10/01/20 0800  . potassium chloride 10 mEq (10/01/20 0800)  . TPN ADULT (ION) 70 mL/hr at 10/01/20 0800    PRN Medications: acetaminophen, albuterol, fentaNYL (SUBLIMAZE) injection, metoprolol tartrate, midazolam, ondansetron (ZOFRAN) IV, sodium chloride flush, sodium chloride flush   Assessment/Plan:   1. 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  - Co-ox 70% on milrinone 0.25. will continue. Off NE - Weight well below baseline. Stop lasix gtt today  - GDMT limited by AKI  - BP up in setting of vent wean. Can add hydral/nitrates as needed  2. NSTEMI with critical CAD - 95% LM and high grade ostial RCA - CABG on 5/6 for critical LM disease (LIMA -> LAD, SVG -> OM, SVG -> RCA) - Continue ASA/statin  - Ad Plavix soon   3. Acute hypoxic respiratory failure due to pulmonary edema - on vent -  Possible aspiration 5/8 & 5/10  - 5/6 Blood CX - NG - On antibiotics  - CCM following - SBT today. Hopefully can extubate soon as mental status tolerates   4. AKI  - due to ATN - Cr 1.0 -> 1.7 -> 1.95-->2.3 ->3->3.3->3.5->2.9-> 2.8 -> 2.7 - Support hemodynamics.  - stop lasix  5. Ventral Hernia  5/10 NG placed to decompress   6. ID On antibiotic for possible aspiration.  Blood CX 5/6- NGTD TMax 101.8 WBC 11  7. Hypokalemia - K 3.0 w/ diuresis. Mg ok at 2.4  - aggressively supp K.   CRITICAL CARE Performed by: Glori Bickers  Total critical care time: 35 minutes  Critical care time was exclusive of separately billable procedures and treating other patients.  Critical care was necessary to treat or prevent imminent or life-threatening deterioration.  Critical care was time spent personally by me (independent of midlevel providers or residents) on the following activities: development of treatment plan with patient and/or surrogate as well as nursing, discussions with consultants, evaluation of patient's response to treatment, examination of patient, obtaining history from patient or surrogate, ordering and performing treatments and interventions, ordering and review of laboratory studies, ordering and review of radiographic studies, pulse oximetry and re-evaluation of patient's condition.    Length of Stay: 8   Glori Bickers MD  10/01/2020, 8:50 AM  Advanced Heart Failure Team Pager [phone] (M-F; 7a - 4p)  Please contact [facility] for night-coverage after hours (4p -7a ) and weekends on [email or web address]

## 2020-10-01 NOTE — Progress Notes (Signed)
NAME:  ZAIN LANKFORD, MRN:  [id number], DOB:  29-Oct-1940, LOS: 8 ADMISSION DATE:  09/23/2020, CONSULTATION DATE:  09/24/2019 REFERRING MD:  Jacinto Halim, CHIEF COMPLAINT:  Acute coronary syndrome   History of Present Illness:  80 year old man who presented with cardiogenic shock secondary to NSTEMI.  Presented with new onset RSCP and severe dyspnea. Found to have acute pulmonary edema and heart failure; intubated and sent for urgent LHC which revealed 3V CAD.  IABP inserted and patient underwent urgent CABG. IABP removed 5/9.  Pertinent  Medical History   Past Medical History:  Diagnosis Date  . Arthritis   . Asthma   . DDD (degenerative disc disease), lumbar   . Hernia of abdominal wall   . RLS (restless legs syndrome)    Significant Hospital Events: Including procedures, antibiotic start and stop dates in addition to other pertinent events   . 5/6 CABG with Dr Cliffton Asters with LIMA to LAD, SVG to RCA d and SVG to ramus. LV normal and returned to ICU with no vasopressor support.  . No post-operative bleeding but poor RV performance on hemodynamics.  . 5/8 significant hemodynamic instability with labile blood pressure and vasopressor requirements yesterday.  Episode of emesis but no evidence of bowel obstruction.  Evidence of pericardial tamponade. . 5/9 improved hemodynamic, able now to tolerate balloon pump weaning and removal. . 5/11 Stable hemodynamics, continuing diuresis. Improved UOP. TPN consult for initiation 5/12 in the setting of ileus, prolonged NPO status.  . 5/13 sedated, diuresis, milrinone, PICC line today, CVL removed  . 5/14 tolerated short SBT/SAT on Precedex  Interim History / Subjective:   Off sedation this morning except for minimal amount of Precedex.  Still not really able to follow commands but tolerating SBT in pressure support  Objective   Blood pressure (!) 158/70, pulse 97, temperature 98.3 F (36.8 C), temperature source Oral, resp. rate 19, height 5\' 6"   (1.676 m), weight 77.8 kg, SpO2 98 %. CVP:  [3 mmHg-7 mmHg] 3 mmHg  Vent Mode: PRVC FiO2 (%):  [40 %] 40 % Set Rate:  [22 bmp] 22 bmp Vt Set:  [560 mL] 560 mL PEEP:  [5 cmH20] 5 cmH20 Plateau Pressure:  [18 cmH20-20 cmH20] 20 cmH20   Intake/Output Summary (Last 24 hours) at 10/01/2020 0841 Last data filed at 10/01/2020 0800 Gross per 24 hour  Intake 3810.99 ml  Output 5775 ml  Net -1964.01 ml   Filed Weights   09/29/20 0615 09/30/20 0132 10/01/20 0550  Weight: 85.8 kg 80.4 kg 77.8 kg   Pulmonary artery pulsatility index: 1.2  Physical Examination: General:  Chronically ill-appearing gentleman intubated on mechanical life support critically ill HEENT:  Endotracheal tube in place Neuro:  Sedated on mechanical support CV: R regular rate rhythm, S1-S2 PULM:  Bilateral mechanically ventilated breath sounds GI:  Obese soft nontender ventral hernia Extremities: no significant edema  Skin: BL LE edema   Labs/imaging that I have personally reviewed: (right click and "Reselect all SmartList Selections" daily)   Potassium 3.0 Sodium 142 BUN 84 Serum creatinine 2.71  Resolved Hospital Problem List   NSTEMI  Assessment & Plan:   Acute hypoxemic hypercapnic respiratory failure requiring intubation mechanical ventilation  Possible aspiration Critically ill due to RV dysfunction following ACS and cardiopulmonary bypass  Cardiogenic shock NSTEMI with culprit RCA involvement ( RV involvement?) Coronary artery disease Acute kidney injury Gastric ileus Thrombocytopenia, improving Agitated delirium controlled Alcohol abuse  Plan:  Wean off of norepinephrine.  Remains on a minimal amount.  Continue milrinone per cardiology. Continue ongoing IV diuresis with Lasix. Continue to follow urine output and electrolytes Central venous line was removed yesterday, PICC line placed. Foley exchanged Continue TPN Would like to restart trickle feeds as soon as possible. We will consider  starting trickle feeds later today or tomorrow morning at 10 cc/h depending upon how much output he has had from his NG tube. Continue Zosyn   Best Practice: (right click and "Reselect all SmartList Selections" daily)  Diet:  NPO, TPN consult placed, will start trickle feeds thereafter Pain/Anxiety/Delirium protocol (if indicated): Yes (RASS goal -1,-2) wean sedation and proceed to SBT l VAP protocol (if indicated): Yes DVT prophylaxis: Subcutaneous Heparin, Plt count > 90  GI prophylaxis: PPI Glucose control:  SSI Yes Central venous access:  Yes, and it is still needed Arterial line:  Yes, and it is still needed Foley:  Yes, and it is still needed Mobility:  bed rest  PT consulted: N/A Last date of multidisciplinary goals of care discussion [Per Primary] Code Status:  full code Disposition: ICU  This patient is critically ill with multiple organ system failure; which, requires frequent high complexity decision making, assessment, support, evaluation, and titration of therapies. This was completed through the application of advanced monitoring technologies and extensive interpretation of multiple databases. During this encounter critical care time was devoted to patient care services described in this note for 32 minutes.  Josephine Igo, DO [facility] 10/01/2020 8:41 AM

## 2020-10-01 NOTE — Progress Notes (Signed)
Nutrition Follow-up  DOCUMENTATION CODES:   Not applicable  INTERVENTION:  Initiate trickle tube feeds via post pyloric Cortrak using Pivot 1.5 formula at rate of 10 ml/hr per MD.   TPN per pharmacy.   Once able to advance past trickle rate, recommend advancing feeds by 10 ml as tolerated to goal rate of 60 ml/hr with 45 ml Prosource BID.  Tube feeding regimen at goal to provide 2240 kcal, 157 grams of protein, and 1093 ml free water.   NUTRITION DIAGNOSIS:   Increased nutrient needs related to post-op healing as evidenced by estimated needs; ongoing  GOAL:   Patient will meet greater than or equal to 90% of their needs; met with TPN  MONITOR:   Vent status,Labs,I & O's,Skin,TF tolerance,Weight trends  REASON FOR ASSESSMENT:   Consult Enteral/tube feeding initiation and management  ASSESSMENT:   Patient with PMH significant for DDD, asthma, HTN, and large ventral hernia. Presented to [facility] ED with shortness of breath, was found to be in cardiogenic shock with NSTEMI.  5/06- s/p heart cath, confirmed critical CAD, s/p emergent CABG x3 wih IABP placement 5/08- large vomiting episode, TF held 5/09- s/p IABP removed, trickle TF started  5/10- large vomiting episode, TF held 5/11- Cortrak tube advanced to ligament of Treitz 5/12- TPN started    Patient is currently intubated on ventilator support MV: 9.2 L/min Temp (24hrs), Avg:99.5 F (37.5 C), Min:98.3 F (36.8 C), Max:100.4 F (38 C)  TPN to be increased to goal rate today which will provide 2178 kcal and 137 grams of protein. Plans to initiate trickle feeds today via Cortrak, however only at rate of 10 ml/hr with plans to monitor tolerance. SBT today and pt has tolerated it well. Per MD, hopeful for extubation as mental status tolerates. Tube feeding recommendations stated above once able to advance past trickle feeds.   Labs and medications reviewed.   Diet Order:   Diet Order    None      EDUCATION NEEDS:    Not appropriate for education at this time  Skin:  Skin Assessment: Reviewed RN Assessment Skin Integrity Issues:: Incisions Incisions: bilateral legs, chest  Last BM:  5/12  Height:   Ht Readings from Last 1 Encounters:  09/23/20 _0  (1.676 m)    Weight:   Wt Readings from Last 1 Encounters:  10/01/20 77.8 kg   BMI:  Body mass index is 27.68 kg/m.  Estimated Nutritional Needs:   Kcal:  4680-3212 kcal  Protein:  135-180 grams  Fluid:  >/= 2 L/day  Corrin Parker, MS, RD, LDN RD pager number/after hours weekend pager number on Amion.

## 2020-10-02 ENCOUNTER — Inpatient Hospital Stay (HOSPITAL_COMMUNITY): Payer: No Typology Code available for payment source

## 2020-10-02 DIAGNOSIS — Z951 Presence of aortocoronary bypass graft: Secondary | ICD-10-CM | POA: Diagnosis not present

## 2020-10-02 DIAGNOSIS — R570 Cardiogenic shock: Secondary | ICD-10-CM | POA: Diagnosis not present

## 2020-10-02 DIAGNOSIS — I5021 Acute systolic (congestive) heart failure: Secondary | ICD-10-CM | POA: Diagnosis not present

## 2020-10-02 LAB — GLUCOSE, CAPILLARY
Glucose-Capillary: 138 mg/dL — ABNORMAL HIGH (ref 70–99)
Glucose-Capillary: 172 mg/dL — ABNORMAL HIGH (ref 70–99)
Glucose-Capillary: 174 mg/dL — ABNORMAL HIGH (ref 70–99)
Glucose-Capillary: 179 mg/dL — ABNORMAL HIGH (ref 70–99)
Glucose-Capillary: 185 mg/dL — ABNORMAL HIGH (ref 70–99)
Glucose-Capillary: 197 mg/dL — ABNORMAL HIGH (ref 70–99)

## 2020-10-02 LAB — COOXEMETRY PANEL
Carboxyhemoglobin: 0.9 % (ref 0.5–1.5)
Methemoglobin: 0.8 % (ref 0.0–1.5)
O2 Saturation: 66.7 %
Total hemoglobin: 13.9 g/dL (ref 12.0–16.0)

## 2020-10-02 LAB — CBC
HCT: 28.9 % — ABNORMAL LOW (ref 39.0–52.0)
Hemoglobin: 8.9 g/dL — ABNORMAL LOW (ref 13.0–17.0)
MCH: 26.3 pg (ref 26.0–34.0)
MCHC: 30.8 g/dL (ref 30.0–36.0)
MCV: 85.5 fL (ref 80.0–100.0)
Platelets: 140 10*3/uL — ABNORMAL LOW (ref 150–400)
RBC: 3.38 MIL/uL — ABNORMAL LOW (ref 4.22–5.81)
RDW: 18.4 % — ABNORMAL HIGH (ref 11.5–15.5)
WBC: 12.9 10*3/uL — ABNORMAL HIGH (ref 4.0–10.5)
nRBC: 0.2 % (ref 0.0–0.2)

## 2020-10-02 LAB — BASIC METABOLIC PANEL
Anion gap: 10 (ref 5–15)
BUN: 88 mg/dL — ABNORMAL HIGH (ref 8–23)
CO2: 30 mmol/L (ref 22–32)
Calcium: 8.7 mg/dL — ABNORMAL LOW (ref 8.9–10.3)
Chloride: 105 mmol/L (ref 98–111)
Creatinine, Ser: 2.37 mg/dL — ABNORMAL HIGH (ref 0.61–1.24)
GFR, Estimated: 27 mL/min — ABNORMAL LOW (ref >60)
Glucose, Bld: 181 mg/dL — ABNORMAL HIGH (ref 70–99)
Potassium: 3.6 mmol/L (ref 3.5–5.1)
Sodium: 145 mmol/L (ref 135–145)

## 2020-10-02 MED ORDER — INSULIN GLARGINE 100 UNIT/ML ~~LOC~~ SOLN
8.0000 [IU] | Freq: Two times a day (BID) | SUBCUTANEOUS | Status: DC
  Administered 2020-10-02 – 2020-10-06 (×10): 8 [IU] via SUBCUTANEOUS
  Filled 2020-10-02 (×12): qty 0.08

## 2020-10-02 MED ORDER — AMIODARONE HCL 200 MG PO TABS
200.0000 mg | ORAL_TABLET | Freq: Two times a day (BID) | ORAL | Status: DC
  Administered 2020-10-02 – 2020-10-09 (×16): 200 mg
  Filled 2020-10-02 (×16): qty 1

## 2020-10-02 MED ORDER — TRAVASOL 10 % IV SOLN
INTRAVENOUS | Status: AC
  Filled 2020-10-02: qty 1368

## 2020-10-02 MED ORDER — ASPIRIN 81 MG PO CHEW
324.0000 mg | CHEWABLE_TABLET | Freq: Every day | ORAL | Status: DC
  Administered 2020-10-02 – 2020-10-17 (×16): 324 mg
  Filled 2020-10-02 (×16): qty 4

## 2020-10-02 MED ORDER — LIP MEDEX EX OINT
TOPICAL_OINTMENT | CUTANEOUS | Status: DC | PRN
  Filled 2020-10-02: qty 7

## 2020-10-02 MED ORDER — POTASSIUM CHLORIDE 20 MEQ PO PACK
40.0000 meq | PACK | Freq: Once | ORAL | Status: AC
  Administered 2020-10-02: 40 meq
  Filled 2020-10-02: qty 2

## 2020-10-02 MED ORDER — ACETAMINOPHEN 160 MG/5ML PO SOLN
650.0000 mg | Freq: Three times a day (TID) | ORAL | Status: DC | PRN
  Administered 2020-10-02 – 2020-10-10 (×5): 650 mg
  Filled 2020-10-02 (×5): qty 20.3

## 2020-10-02 MED ORDER — CHLORHEXIDINE GLUCONATE CLOTH 2 % EX PADS
6.0000 | MEDICATED_PAD | Freq: Every day | CUTANEOUS | Status: DC
  Administered 2020-10-02 – 2020-10-20 (×19): 6 via TOPICAL

## 2020-10-02 MED ORDER — FUROSEMIDE 10 MG/ML IJ SOLN
60.0000 mg | Freq: Once | INTRAMUSCULAR | Status: AC
  Administered 2020-10-02: 60 mg via INTRAVENOUS
  Filled 2020-10-02: qty 6

## 2020-10-02 NOTE — Progress Notes (Signed)
NAME:  Jack Vance, MRN:  [id number], DOB:  [birth year], LOS: 9 ADMISSION DATE:  09/23/2020, CONSULTATION DATE:  09/24/2019 REFERRING MD:  Jacinto Halim, CHIEF COMPLAINT:  Acute coronary syndrome   History of Present Illness:  80 year old man who presented with cardiogenic shock secondary to NSTEMI.  Presented with new onset RSCP and severe dyspnea. Found to have acute pulmonary edema and heart failure; intubated and sent for urgent LHC which revealed 3V CAD.  IABP inserted and patient underwent urgent CABG. IABP removed 5/9.  Pertinent  Medical History   Past Medical History:  Diagnosis Date  . Arthritis   . Asthma   . DDD (degenerative disc disease), lumbar   . Hernia of abdominal wall   . RLS (restless legs syndrome)    Significant Hospital Events: Including procedures, antibiotic start and stop dates in addition to other pertinent events   . 5/6 CABG with Dr Cliffton Asters with LIMA to LAD, SVG to RCA d and SVG to ramus. LV normal and returned to ICU with no vasopressor support.  . No post-operative bleeding but poor RV performance on hemodynamics.  . 5/8 significant hemodynamic instability with labile blood pressure and vasopressor requirements yesterday.  Episode of emesis but no evidence of bowel obstruction.  Evidence of pericardial tamponade. . 5/9 improved hemodynamic, able now to tolerate balloon pump weaning and removal. . 5/11 Stable hemodynamics, continuing diuresis. Improved UOP. TPN consult for initiation 5/12 in the setting of ileus, prolonged NPO status.  . 5/13 sedated, diuresis, milrinone, PICC line today, CVL removed  . 5/14 tolerated short SBT/SAT on Precedex  Interim History / Subjective:   Remains off sedation. Tolerating SBT. Follows minimal commands when forced.   Objective   Blood pressure (!) 154/70, pulse (!) 105, temperature 98.5 F (36.9 C), temperature source Oral, resp. rate 15, height 5\' 6"  (1.676 m), weight 78.8 kg, SpO2 98 %. CVP:  [3 mmHg-9 mmHg] 8 mmHg   Vent Mode: PRVC FiO2 (%):  [40 %] 40 % Set Rate:  [22 bmp] 22 bmp Vt Set:  [560 mL] 560 mL PEEP:  [5 cmH20] 5 cmH20 Pressure Support:  [10 cmH20] 10 cmH20 Plateau Pressure:  [15 cmH20-22 cmH20] 21 cmH20   Intake/Output Summary (Last 24 hours) at 10/02/2020 0727 Last data filed at 10/02/2020 0700 Gross per 24 hour  Intake 3861.94 ml  Output 4885 ml  Net -1023.06 ml   Filed Weights   09/30/20 0132 10/01/20 0550 10/02/20 0500  Weight: 80.4 kg 77.8 kg 78.8 kg    Physical Examination: General:  chronically ill appearing male, on mechanical life support  HEENT:  ETT in place  Neuro:  off sedation, follows commands, however sluggish to response  CV: RRR, s1 s2  PULM: BL mechanically ventilated breaths  GI: obese, soft, nt nd Extremities: no significant edema  Skin: trace edema   Labs/imaging that I have personally reviewed: (right click and "Reselect all SmartList Selections" daily)   Potassium 3.0 Sodium 142 BUN 84 Serum creatinine 2.71  Resolved Hospital Problem List   NSTEMI  Assessment & Plan:   Acute hypoxemic hypercapnic respiratory failure requiring intubation mechanical ventilation  Possible aspiration Critically ill due to RV dysfunction following ACS and cardiopulmonary bypass  Cardiogenic shock NSTEMI with culprit RCA involvement Coronary artery disease Acute kidney injury, improved  Gastric ileus, improved Thrombocytopenia, improving Agitated delirium controlled Alcohol abuse  Plan:  Wean off of NEPI Continue milrinone  Lasix stopped yesterday  Tolerating trickle feeding 10cc/hr with post-pyloric cortrack  NGT to  suction Adult mechanic vent protocol, SBT SAT today  If mental status precludes we can extubate today   Continue zosyn    Best Practice: (right click and "Reselect all SmartList Selections" daily)  Diet:  NPO, TPN consult placed, will start trickle feeds thereafter Pain/Anxiety/Delirium protocol (if indicated): Yes (RASS goal -1,-2)  wean sedation and proceed to SBT l VAP protocol (if indicated): Yes DVT prophylaxis: Subcutaneous Heparin, Plt count > 90  GI prophylaxis: PPI Glucose control:  SSI Yes Central venous access:  Yes, and it is still needed Arterial line:  Yes, and it is still needed Foley:  Yes, and it is still needed Mobility:  bed rest  PT consulted: N/A Last date of multidisciplinary goals of care discussion [Per Primary] Code Status:  full code Disposition: ICU  This patient is critically ill with multiple organ system failure; which, requires frequent high complexity decision making, assessment, support, evaluation, and titration of therapies. This was completed through the application of advanced monitoring technologies and extensive interpretation of multiple databases. During this encounter critical care time was devoted to patient care services described in this note for 32 minutes.  Josephine Igo, DO [facility] 10/02/2020 7:29 AM

## 2020-10-02 NOTE — Progress Notes (Addendum)
Advanced Heart Failure Rounding Note   Subjective:    Underwent emergent CABG on 5/6 for critical LM disease (LIMA -> LAD, SVG -> OM, SVG -> RCA) 5/6 Cultures negative.  5/8 Possible aspiration. A fib --> started amio drip.  Given 2u PRBCs. Hypotensive in the evening and started on vasopressin + antibiotics.  5/9 IABP removed 5/10 NG placed >1.5 out  5/11 started neostigmime 5/12 Febrile 102  Remains on vent. FiO2 40%. Sedation turned off this am. Will awaken and follow some commands.   Remains on milrinone 0.25. Norepi off.  Co-ox 67%  Lasix stopped yesterday. Weight up 2 pounds but still well below baseline.   CXR with small effusions and vascular congestion  On TPN and trickle TFs  Creatinine improving 2.6 -> 2.37  On zosyn Tm 101.7  Objective:   Weight Range:  Vital Signs:   Temp:  [98.2 F (36.8 C)-101.7 F (38.7 C)] 98.2 F (36.8 C) (05/15 0748) Pulse Rate:  [105-119] 114 (05/15 0857) Resp:  [9-35] 35 (05/15 0857) BP: (126-181)/(60-86) 163/60 (05/15 0857) SpO2:  [95 %-98 %] 96 % (05/15 0857) Arterial Line BP: (148-208)/(51-75) 178/51 (05/15 0800) FiO2 (%):  [40 %] 40 % (05/15 0857) Weight:  [78.8 kg] 78.8 kg (05/15 0500) Last BM Date: 09/29/20  Weight change: Filed Weights   09/30/20 0132 10/01/20 0550 10/02/20 0500  Weight: 80.4 kg 77.8 kg 78.8 kg    Intake/Output:   Intake/Output Summary (Last 24 hours) at 10/02/2020 0946 Last data filed at 10/02/2020 0900 Gross per 24 hour  Intake 3974.95 ml  Output 4635 ml  Net -660.05 ml     PHYSICAL EXAM: General: On vent. Arousable following some commands but confused HEENT: normal + ETT Neck: supple. no JVD. Carotids 2+ bilat; no bruits. No lymphadenopathy or thryomegaly appreciated. Cor: Sternal wound ok. PMI nondisplaced. Regular rate & rhythm. No rubs, gallops or murmurs. Lungs: coarse  Abdomen: soft, nontender, nondistended. Large ventral hernia  Extremities: no cyanosis, clubbing, rash,  edema Neuro: on vent    Telemetry: Sinus 90s Personally reviewed  Labs: Basic Metabolic Panel: Recent Labs  Lab 09/25/20 1609 09/25/20 1739 09/27/20 1720 09/28/20 0542 09/28/20 1656 09/29/20 0602 09/30/20 0337 09/30/20 1556 10/01/20 0015 10/01/20 0319 10/01/20 1307 10/02/20 0355  NA 132*   < > 135 138   < > 140 144 142  --  142 143 145  K 3.9   < > 3.5 3.1*   < > 3.2* 2.4* 3.0* 3.2* 3.0* 3.4* 3.6  CL 103   < > 98 101   < > 99 102 100  --  98 100 105  CO2 18*   < > 21* 23   < > _0 --  31 34* 30  GLUCOSE 217*   < > 96 96   < > 143* 195* 162*  --  182* 174* 181*  BUN 55*   < > 73* 74*   < > 77* 80* 80*  --  84* 85* 88*  CREATININE 2.34*   < > 3.22* 3.33*   < > 3.46* 2.92* 2.76*  --  2.71* 2.58* 2.37*  CALCIUM 7.2*   < > 7.4* 7.8*   < > 7.9* 8.4* 8.2*  --  8.3* 8.5* 8.7*  MG 2.7*   < > 2.4 2.5*  --  2.2 2.4  --   --   --  2.1  --   PHOS 3.5  --   --   --   --  6.7* 4.4  --   --   --   --   --    < > = values in this interval not displayed.    Liver Function Tests: Recent Labs  Lab 09/26/20 0413 09/29/20 0602 09/30/20 0337  AST 54* 29 29  ALT _0 ALKPHOS 49 114 117  BILITOT 0.6 1.1 0.8  PROT 4.5* 5.1* 5.3*  ALBUMIN 1.9* 1.6* 1.6*   No results for input(s): LIPASE, AMYLASE in the last 168 hours. No results for input(s): AMMONIA in the last 168 hours.  CBC: Recent Labs  Lab 09/28/20 0542 09/29/20 0602 09/30/20 0337 10/01/20 0319 10/02/20 0355  WBC 9.5 11.1* 11.0* 11.9* 12.9*  NEUTROABS  --   --  9.8*  --   --   HGB 9.2* 9.2* 9.2* 8.9* 8.9*  HCT 26.5* 27.9* 28.2* 28.0* 28.9*  MCV 78.6* 81.8 81.5 83.3 85.5  PLT 93* 95* 90* 109* 140*    Cardiac Enzymes: No results for input(s): CKTOTAL, CKMB, CKMBINDEX, TROPONINI in the last 168 hours.  BNP: BNP (last 3 results) Recent Labs    09/23/20 0311  BNP 1,191.8*    ProBNP (last 3 results) No results for input(s): PROBNP in the last 8760 hours.    Other results:  Imaging: DG Chest Port 1  View  Result Date: 10/02/2020 CLINICAL DATA:  Status post open heart surgery. EXAM: PORTABLE CHEST 1 VIEW COMPARISON:  09/30/2020 FINDINGS: The ET tube tip is above the carina. Left IJ catheter tip is in the projection of the right atrium. Feeding tube tip is below the field of view as well as the level of the GE junction. Status post median sternotomy. There is diffuse pulmonary vascular congestion. Small pleural effusions with mild bibasilar atelectasis. IMPRESSION: 1. Small bilateral pleural effusions and bibasilar atelectasis. 2. Pulmonary vascular congestion. Electronically Signed   By: Kerby Moors M.D.   On: 10/02/2020 08:24   DG CHEST PORT 1 VIEW  Result Date: 09/30/2020 CLINICAL DATA:  CABG, respiratory dependent.  Pleural effusions. EXAM: PORTABLE CHEST 1 VIEW COMPARISON:  Chest x-ray dated 09/28/2020 and 09/27/2020. FINDINGS: Heart size and mediastinal contours are stable. RIGHT IJ Cordis has been removed. Endotracheal tube and enteric tube appear appropriately positioned. LEFT-sided PICC line has been placed and appears well positioned with tip at the level of the lower SVC. Probable mild interstitial edema. Probable small bilateral pleural effusions and/or mild bibasilar atelectasis. IMPRESSION: 1. Probable small bilateral pleural effusions and/or mild bibasilar atelectasis. 2. Probable mild interstitial edema. 3. Support apparatus appears well positioned. Electronically Signed   By: Franki Cabot M.D.   On: 09/30/2020 15:25     Medications:     Scheduled Medications: . aspirin  324 mg Per Tube Daily  . bisacodyl  10 mg Rectal Daily  . chlorhexidine gluconate (MEDLINE KIT)  15 mL Mouth Rinse BID  . Chlorhexidine Gluconate Cloth  6 each Topical Daily  . feeding supplement (PIVOT 1.5 CAL)  1,000 mL Per Tube Q24H  . free water  30 mL Per Tube Q4H  . heparin injection (subcutaneous)  5,000 Units Subcutaneous Q8H  . hydrALAZINE  25 mg Per Tube Q8H  . insulin aspart  0-24 Units  Subcutaneous Q4H  . insulin glargine  8 Units Subcutaneous BID  . ipratropium-albuterol  3 mL Nebulization Q6H  . isosorbide dinitrate  10 mg Per Tube TID  . mouth rinse  15 mL Mouth Rinse 10 times per day  . metoCLOPramide (REGLAN) injection  5 mg Intravenous Q6H  . neostigmine  0.25 mg Subcutaneous Q6H  . pantoprazole (PROTONIX) IV  40 mg Intravenous Q24H  . sodium chloride flush  10-40 mL Intracatheter Q12H  . sodium chloride flush  3 mL Intravenous Q12H    Infusions: . amiodarone 30 mg/hr (10/02/20 0900)  . dexmedetomidine (PRECEDEX) IV infusion Stopped (10/02/20 0646)  . fentaNYL infusion INTRAVENOUS Stopped (10/01/20 0024)  . lactated ringers 10 mL/hr at 10/01/20 1900  . lactated ringers Stopped (10/01/20 3709)  . milrinone 0.25 mcg/kg/min (10/02/20 0900)  . norepinephrine (LEVOPHED) Adult infusion Stopped (09/30/20 0826)  . piperacillin-tazobactam (ZOSYN)  IV 12.5 mL/hr at 10/02/20 0900  . TPN ADULT (ION) 95 mL/hr at 10/02/20 0900    PRN Medications: acetaminophen, albuterol, fentaNYL (SUBLIMAZE) injection, metoprolol tartrate, midazolam, ondansetron (ZOFRAN) IV, sodium chloride flush, sodium chloride flush   Assessment/Plan:   1. 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  - Co-ox 70% on milrinone 0.25. will continue. Off NE - Weight well below baseline. Will give one dose IV lasix to help facilitate extubation today  - BP remains elevated. Will increase hydral/nitrates - No ACE/ARB/ARNI/MRA with AKI. No b-blocker yet with shock.   2. NSTEMI with critical CAD - 95% LM and high grade ostial RCA - CABG on 5/6 for critical LM disease (LIMA -> LAD, SVG -> OM, SVG -> RCA) - Continue ASA/statin  - Add Plavix soon   3. Acute hypoxic respiratory failure due to pulmonary edema - on vent - Possible aspiration  5/8 & 5/10  - 5/6 Blood CX - NG - On antibiotics  - CCM following - SBT agan today. Mental status remains major issue. May need trach to enable wean  - Will give one dose IV lasix  4. AKI  - due to ATN - Cr 1.0 -> 1.7 -> 1.95-->2.3 ->3->3.3->3.5->2.9-> 2.8 -> 2.7 -> 2.37 - Support hemodynamics.  - continue to follow  5. Ventral Hernia  5/10 NG placed to decompress   6. ID On zosyn for possible aspiration.  Blood CX 5/6- NGTD TMax 101.7  CRITICAL CARE Performed by: Glori Bickers  Total critical care time: 35 minutes  Critical care time was exclusive of separately billable procedures and treating other patients.  Critical care was necessary to treat or prevent imminent or life-threatening deterioration.  Critical care was time spent personally by me (independent of midlevel providers or residents) on the following activities: development of treatment plan with patient and/or surrogate as well as nursing, discussions with consultants, evaluation of patient's response to treatment, examination of patient, obtaining history from patient or surrogate, ordering and performing treatments and interventions, ordering and review of laboratory studies, ordering and review of radiographic studies, pulse oximetry and re-evaluation of patient's condition.     Length of Stay: 9   Glori Bickers MD  10/02/2020, 9:46 AM  Advanced Heart Failure Team Pager [phone] (M-F; 7a - 4p)  Please contact [facility] for night-coverage after hours (4p -7a ) and weekends on [email or web address]

## 2020-10-02 NOTE — Progress Notes (Signed)
Patient placed back on full vent support due to increased RR (40's) and WOB.

## 2020-10-02 NOTE — Progress Notes (Signed)
9 Days Post-Op Procedure(s) (LRB): CORONARY ARTERY BYPASS GRAFTING (CABG)  TIMES 4 USING LEFT GREATER SAPHENOUS VEIN HARVESTED ENDOSCOPICALLY AND LEFT INTERNAL MAMMARY ARTERY (N/A) TRANSESOPHAGEAL ECHOCARDIOGRAM (TEE) (N/A) Subjective: sedated  Objective: Vital signs in last 24 hours: Temp:  [98.2 F (36.8 C)-101.7 F (38.7 C)] 98.2 F (36.8 C) (05/15 0748) Pulse Rate:  [105-119] 114 (05/15 0857) Cardiac Rhythm: Sinus tachycardia (05/15 0800) Resp:  [9-35] 35 (05/15 0857) BP: (126-181)/(60-86) 163/60 (05/15 0857) SpO2:  [95 %-98 %] 96 % (05/15 0857) Arterial Line BP: (148-208)/(51-75) 178/51 (05/15 0800) FiO2 (%):  [40 %] 40 % (05/15 0857) Weight:  [78.8 kg] 78.8 kg (05/15 0500)  Hemodynamic parameters for last 24 hours: CVP:  [3 mmHg-11 mmHg] 6 mmHg  Intake/Output from previous day: 05/14 0701 - 05/15 0700 In: 3861.9 [I.V.:2743.4; NG/GT:550; IV Piggyback:568.6] Out: 8921 [JHERD:4081; Emesis/NG output:550] Intake/Output this shift: Total I/O In: 300.7 [I.V.:235.6; NG/GT:40; IV Piggyback:25.1] Out: 250 [Urine:150; Emesis/NG output:100]  General appearance: no distress Neurologic: unable to fully assess Heart: tachycardiac Lungs: clear to auscultation bilaterally Abdomen: soft, non-tender; bowel sounds normal; no masses,  no organomegaly Extremities: extremities normal, atraumatic, no cyanosis or edema Wound: c/d/i  Lab Results: Recent Labs    10/01/20 0319 10/02/20 0355  WBC 11.9* 12.9*  HGB 8.9* 8.9*  HCT 28.0* 28.9*  PLT 109* 140*   BMET:  Recent Labs    10/01/20 1307 10/02/20 0355  NA 143 145  K 3.4* 3.6  CL 100 105  CO2 34* 30  GLUCOSE 174* 181*  BUN 85* 88*  CREATININE 2.58* 2.37*  CALCIUM 8.5* 8.7*    PT/INR: No results for input(s): LABPROT, INR in the last 72 hours. ABG    Component Value Date/Time   PHART 7.410 09/26/2020 0539   HCO3 19.9 (L) 09/26/2020 0539   TCO2 21 (L) 09/26/2020 0539   ACIDBASEDEF 4.0 (H) 09/26/2020 0539   O2SAT  66.7 10/02/2020 0355   CBG (last 3)  Recent Labs    10/01/20 2320 10/02/20 0330 10/02/20 0735  GLUCAP 209* 174* 185*    Assessment/Plan: S/P Procedure(s) (LRB): CORONARY ARTERY BYPASS GRAFTING (CABG)  TIMES 4 USING LEFT GREATER SAPHENOUS VEIN HARVESTED ENDOSCOPICALLY AND LEFT INTERNAL MAMMARY ARTERY (N/A) TRANSESOPHAGEAL ECHOCARDIOGRAM (TEE) (N/A) Diabetes control ok to switch to po amiodarone  Vent management per CCM   LOS: 9 days    Linden Dolin 10/02/2020

## 2020-10-02 NOTE — Progress Notes (Signed)
PHARMACY - TOTAL PARENTERAL NUTRITION CONSULT NOTE   Indication: Prolonged ileus  Patient Measurements: Height: 5\' 6"  (167.6 cm) Weight: 78.8 kg (173 lb 11.6 oz) IBW/kg (Calculated) : 63.8 TPN AdjBW (KG): 68.6 Body mass index is 28.04 kg/m.  Assessment: 43 yom presenting with cardiogenic shock secondary to NSTEMI, s/p urgent cath revealing 3V CAD and subsequent emergent CABG. Patient with ileus post-op, prolonged NPO status. Cortrak placed 5/11. Trickle tube were attempted x 2 (5/7-5/8, 5/9-5/11) and held both times with vomiting. Pharmacy consulted to start TPN.  Patient currently intubated, sedated in the ICU on fentanyl/Precedex/Versed, continuing on support with milrinone and norepinephrine. Also on amiodarone drip per TCTS.  Glucose / Insulin: No previous hx DM. A1c 6.3% on 09/25/20. CBGs 150-209. Insulin drip off, utilized 26 units SSI in last 24hrs Electrolytes: K 3.6 (received 140 meq yesterday while on Lasix; goal >/=4 with ileus), CoCa 10.6 (decr in TPN 5/14); others WNL Renal: AKI - SCr down to 2.37 (SCr 1 on 5/6), BUN up to 88. Lasix drip stopped 5/14, net -5.8L Hepatic: LFTs / Tbili WNL, TG up 216. Albumin 1.6, prealbumin 7.2 Intake / Output; MIVF: UOP 2.3 ml/kg/hr, NGT output up 550 ml/24hrs, +BM s/p SMOG enema 5/12  GI Imaging: none since TPN start GI Surgeries / Procedures: none since TPN start  Central access: CVC triple lumen placed 09/24/20 (removed 5/13); triple lumen PICC placed 09/30/20 TPN start date: 09/29/20  Nutritional Goals (per RD recommendation on 09/29/20): kCal: 11/29/20, Protein: 135-180g, Fluid: >/=2 L/day  Goal TPN rate is 95 mL/hr (provides 137 g of protein and 2178 kcals per day) Tube feed regimen provides 2240 kcal and 157 g protein  Current Nutrition:  NPO and TPN  Pivot 1.5 at 10 ml/hr (trickle feeds)  Plan:  Unable to concentrate TPN due to national shortage of Clinisol.  Continue TPN at goal rate of 95 mL/hr at 1800.  TPN will provide 137g  protein and 2178 kCal, meeting 100% of patient needs Electrolytes in TPN: continue standard lytes except reduced Phos/Ca - Na 25mEq/L, K 71mEq/L, remove Ca, Mg 39mEq/L, and reduced Phos to 5 mmol/L. Cont Cl:Ac 2:1 KCl 40 meq per tube x1 Add standard MVI and trace elements + thiamine and folic acid per dietitian recommendation to TPN Continue TCTS q4h SSI for now and adjust as needed Begin Lantus 8 units SQ bid per discussion with Dr 4m Monitor TPN labs, for possible refeeding F/u plans for extubation, toleration of TF/diet and ability to wean TPN  Thank you for involving pharmacy in this patient's care.  Tonia Brooms, PharmD, BCPS Clinical Pharmacist Clinical phone for 10/02/2020 until 3p is 10/04/2020 10/02/2020 7:10 AM  **Pharmacist phone directory can be found on [email or web address] listed under [facility]**

## 2020-10-03 DIAGNOSIS — J9601 Acute respiratory failure with hypoxia: Secondary | ICD-10-CM

## 2020-10-03 DIAGNOSIS — R570 Cardiogenic shock: Secondary | ICD-10-CM | POA: Diagnosis not present

## 2020-10-03 DIAGNOSIS — I5021 Acute systolic (congestive) heart failure: Secondary | ICD-10-CM | POA: Diagnosis not present

## 2020-10-03 DIAGNOSIS — Z9911 Dependence on respirator [ventilator] status: Secondary | ICD-10-CM | POA: Diagnosis not present

## 2020-10-03 DIAGNOSIS — Z951 Presence of aortocoronary bypass graft: Secondary | ICD-10-CM | POA: Diagnosis not present

## 2020-10-03 LAB — COMPREHENSIVE METABOLIC PANEL
ALT: 17 U/L (ref 0–44)
AST: 42 U/L — ABNORMAL HIGH (ref 15–41)
Albumin: 1.7 g/dL — ABNORMAL LOW (ref 3.5–5.0)
Alkaline Phosphatase: 72 U/L (ref 38–126)
Anion gap: 6 (ref 5–15)
BUN: 100 mg/dL — ABNORMAL HIGH (ref 8–23)
CO2: 29 mmol/L (ref 22–32)
Calcium: 8.5 mg/dL — ABNORMAL LOW (ref 8.9–10.3)
Chloride: 112 mmol/L — ABNORMAL HIGH (ref 98–111)
Creatinine, Ser: 2.48 mg/dL — ABNORMAL HIGH (ref 0.61–1.24)
GFR, Estimated: 26 mL/min — ABNORMAL LOW (ref >60)
Glucose, Bld: 160 mg/dL — ABNORMAL HIGH (ref 70–99)
Potassium: 3.7 mmol/L (ref 3.5–5.1)
Sodium: 147 mmol/L — ABNORMAL HIGH (ref 135–145)
Total Bilirubin: 1 mg/dL (ref 0.3–1.2)
Total Protein: 5.4 g/dL — ABNORMAL LOW (ref 6.5–8.1)

## 2020-10-03 LAB — DIFFERENTIAL
Abs Immature Granulocytes: 0.15 10*3/uL — ABNORMAL HIGH (ref 0.00–0.07)
Basophils Absolute: 0.1 10*3/uL (ref 0.0–0.1)
Basophils Relative: 1 %
Eosinophils Absolute: 0.2 10*3/uL (ref 0.0–0.5)
Eosinophils Relative: 1 %
Immature Granulocytes: 1 %
Lymphocytes Relative: 10 %
Lymphs Abs: 1.2 10*3/uL (ref 0.7–4.0)
Monocytes Absolute: 1.1 10*3/uL — ABNORMAL HIGH (ref 0.1–1.0)
Monocytes Relative: 9 %
Neutro Abs: 9.5 10*3/uL — ABNORMAL HIGH (ref 1.7–7.7)
Neutrophils Relative %: 78 %

## 2020-10-03 LAB — CBC
HCT: 26.9 % — ABNORMAL LOW (ref 39.0–52.0)
Hemoglobin: 8.2 g/dL — ABNORMAL LOW (ref 13.0–17.0)
MCH: 26.5 pg (ref 26.0–34.0)
MCHC: 30.5 g/dL (ref 30.0–36.0)
MCV: 87.1 fL (ref 80.0–100.0)
Platelets: 181 10*3/uL (ref 150–400)
RBC: 3.09 MIL/uL — ABNORMAL LOW (ref 4.22–5.81)
RDW: 18.6 % — ABNORMAL HIGH (ref 11.5–15.5)
WBC: 12.1 10*3/uL — ABNORMAL HIGH (ref 4.0–10.5)
nRBC: 0 % (ref 0.0–0.2)

## 2020-10-03 LAB — GLUCOSE, CAPILLARY
Glucose-Capillary: 124 mg/dL — ABNORMAL HIGH (ref 70–99)
Glucose-Capillary: 147 mg/dL — ABNORMAL HIGH (ref 70–99)
Glucose-Capillary: 160 mg/dL — ABNORMAL HIGH (ref 70–99)
Glucose-Capillary: 150 mg/dL — ABNORMAL HIGH (ref 70–99)
Glucose-Capillary: 154 mg/dL — ABNORMAL HIGH (ref 70–99)
Glucose-Capillary: 159 mg/dL — ABNORMAL HIGH (ref 70–99)

## 2020-10-03 LAB — COOXEMETRY PANEL
Carboxyhemoglobin: 0.9 % (ref 0.5–1.5)
Methemoglobin: 0.9 % (ref 0.0–1.5)
O2 Saturation: 67.1 %
Total hemoglobin: 17.6 g/dL — ABNORMAL HIGH (ref 12.0–16.0)

## 2020-10-03 LAB — TRIGLYCERIDES: Triglycerides: 162 mg/dL — ABNORMAL HIGH (ref <150)

## 2020-10-03 LAB — MAGNESIUM: Magnesium: 2.2 mg/dL (ref 1.7–2.4)

## 2020-10-03 LAB — PHOSPHORUS: Phosphorus: 1.5 mg/dL — ABNORMAL LOW (ref 2.5–4.6)

## 2020-10-03 LAB — PREALBUMIN: Prealbumin: 14.7 mg/dL — ABNORMAL LOW (ref 18–38)

## 2020-10-03 MED ORDER — TRAVASOL 10 % IV SOLN
INTRAVENOUS | Status: AC
  Filled 2020-10-03: qty 1368

## 2020-10-03 MED ORDER — POTASSIUM CHLORIDE 20 MEQ PO PACK
20.0000 meq | PACK | Freq: Once | ORAL | Status: AC
  Administered 2020-10-03: 20 meq
  Filled 2020-10-03: qty 1

## 2020-10-03 MED ORDER — K PHOS MONO-SOD PHOS DI & MONO 155-852-130 MG PO TABS
250.0000 mg | ORAL_TABLET | Freq: Once | ORAL | Status: AC
  Administered 2020-10-03: 250 mg
  Filled 2020-10-03: qty 1

## 2020-10-03 MED ORDER — DEXTROSE 5 % IV SOLN: INTRAVENOUS | Status: DC

## 2020-10-03 MED ORDER — PROSOURCE TF PO LIQD
45.0000 mL | Freq: Two times a day (BID) | ORAL | Status: DC
  Administered 2020-10-03 – 2020-10-06 (×6): 45 mL
  Filled 2020-10-03 (×6): qty 45

## 2020-10-03 MED ORDER — POTASSIUM & SODIUM PHOSPHATES 280-160-250 MG PO PACK: 1.0000 | PACK | Freq: Three times a day (TID) | ORAL | Status: DC

## 2020-10-03 MED ORDER — PIVOT 1.5 CAL PO LIQD
1000.0000 mL | ORAL | Status: DC
  Administered 2020-10-03 – 2020-10-06 (×4): 1000 mL

## 2020-10-03 MED ORDER — POTASSIUM & SODIUM PHOSPHATES 280-160-250 MG PO PACK
1.0000 | PACK | Freq: Once | ORAL | Status: AC
  Administered 2020-10-03: 1 via ORAL
  Filled 2020-10-03: qty 1

## 2020-10-03 NOTE — Progress Notes (Signed)
NAME:  Jack Vance, MRN:  [id number], DOB:  April 11, 1941, LOS: 10 ADMISSION DATE:  09/23/2020, CONSULTATION DATE:  09/24/2019 REFERRING MD:  Jacinto Halim, CHIEF COMPLAINT:  Acute coronary syndrome   History of Present Illness:  80 year old man who presented with cardiogenic shock secondary to NSTEMI.  Presented with new onset RSCP and severe dyspnea. Found to have acute pulmonary edema and heart failure; intubated and sent for urgent LHC which revealed 3V CAD.  IABP inserted and patient underwent urgent CABG. IABP removed 5/9.  Pertinent  Medical History   Past Medical History:  Diagnosis Date  . Arthritis   . Asthma   . DDD (degenerative disc disease), lumbar   . Hernia of abdominal wall   . RLS (restless legs syndrome)    Significant Hospital Events: Including procedures, antibiotic start and stop dates in addition to other pertinent events   . 5/6 CABG with Dr Cliffton Asters with LIMA to LAD, SVG to RCA d and SVG to ramus. LV normal and returned to ICU with no vasopressor support.  . No post-operative bleeding but poor RV performance on hemodynamics.  . 5/8 significant hemodynamic instability with labile blood pressure and vasopressor requirements yesterday.  Episode of emesis but no evidence of bowel obstruction.  Evidence of pericardial tamponade. . 5/9 improved hemodynamic, able now to tolerate balloon pump weaning and removal. . 5/11 Stable hemodynamics, continuing diuresis. Improved UOP. TPN consult for initiation 5/12 in the setting of ileus, prolonged NPO status.  . 5/13 sedated, diuresis, milrinone, PICC line today, CVL removed  . 5/14 tolerated short SBT/SAT on Precedex . 5/16 Not tolerating SBT due to significant tachypnea to 50s, placed back on PRVC.  Interim History / Subjective:  No significant events overnight Attempted brief SBT today, became markedly tachypneic to 50s Following commands intermittently  Objective   Blood pressure 131/62, pulse (!) 119, temperature 99.8 F  (37.7 C), temperature source Oral, resp. rate (!) 29, height 5\' 6"  (1.676 m), weight 77.6 kg, SpO2 98 %. CVP:  [0 mmHg-8 mmHg] 2 mmHg  Vent Mode: PRVC FiO2 (%):  [40 %] 40 % Set Rate:  [22 bmp] 22 bmp Vt Set:  [560 mL] 560 mL PEEP:  [5 cmH20] 5 cmH20 Plateau Pressure:  [20 cmH20-28 cmH20] 22 cmH20   Intake/Output Summary (Last 24 hours) at 10/03/2020 0911 Last data filed at 10/03/2020 0800 Gross per 24 hour  Intake 3482.54 ml  Output 4435 ml  Net -952.46 ml   Filed Weights   10/01/20 0550 10/02/20 0500 10/03/20 0515  Weight: 77.8 kg 78.8 kg 77.6 kg   Physical Examination: General: Chronically ill-appearing male in NAD. HEENT: [place]/AT, anicteric sclera, PERRL, moist mucous membranes. ETT/NGT in place. Neuro: Lethargic. Responds to verbal stimuli. Following commands intermittently. Moves all 4 extremities spontaneously.+Cough and +Gag  CV: Tachycardic, no m/g/r. PULM: Tachypneic to 40s with mildly labored breathing/accessory muscle use on vent (PEEP 5, FiO2 40%). Lung fields coarse bilaterally. GI: Soft, nontender, mildly distended with large midline to R sided ventral hernia. Normoactive bowel sounds. Extremities: Trace LE edema noted. Skin: Warm/dry, no rashes.  Labs/imaging that I have personally reviewed: (right click and "Reselect all SmartList Selections" daily)  WBC 12.1, H&H 8.2/26.9/Plt 151  Na 147 (145), K 3.7, CO2 29, BUN 100 (88), Cr 2.48 (2.37), peak 2.9  Glucoses 124-185 last 24H  Co-ox 67.1%  Prealbumin 14.7  TG 162 (216)  CXR 5/15 small bilateral pleural effusions, bibasilar atelectasis, pulmonary vascular congestion  Resolved Hospital Problem List   NSTEMI  Assessment & Plan:   Acute hypoxemic hypercapnic respiratory failure requiring intubation mechanical ventilation Possible aspiration - Continue full vent support (4-8cc/kg IBW) - Wean FiO2 for O2 sat > 90% - Daily WUA/SBT, may require multiple short SBTs given tachypnea/increased WOB - VAP  bundle - Pulmonary hygiene - PAD protocol for sedation: Precedex for goal RASS 0 to -1  - S/p Zosyn course - Extubation currently precluding by weakness (failing SBTs due to marked tachypnea) and waxing/waning mental status  Critically ill due to RV dysfunction following ACS and cardiopulmonary bypass  Cardiogenic shock NSTEMI with culprit RCA involvement Coronary artery disease - Appreciate management of TCTS/HF teams - Diuresis per primary team - Remains on amiodarone - Milrinone for RV support  Acute kidney injury, improved  Likely multifactorial; favor primarily prerenal in the setting of hemodynamic insults, cardiogenic shock, aggressive diuresis for volume management, medications. Baseline Cr ~1.0.  - Trend BMP - Replete electrolytes as indicated - Monitor I&Os - F/u urine studies - Avoid nephrotoxic agents as able - Ensure adequate renal perfusion  Gastric ileus, improved Increasing abdominal distention and hypoactive bowel sounds. Tolerating trickle TFs. - Increase TF rate to 61mL/hr via Cortrak - Continue NGT - Continue TPN  Thrombocytopenia, improving - Trend Plt count - Continue SQH  Agitated delirium controlled Alcohol abuse - Delirium precautions  Best Practice: (right click and "Reselect all SmartList Selections" daily)  Diet:  NPO, TPN consult placed, will start trickle feeds thereafter Pain/Anxiety/Delirium protocol (if indicated): Yes (RASS goal -1,-2) wean sedation and proceed to SBT l VAP protocol (if indicated): Yes DVT prophylaxis: Subcutaneous Heparin, Plt count > 90  GI prophylaxis: PPI Glucose control:  SSI Yes Central venous access:  Yes, and it is still needed Arterial line:  Yes, and it is still needed Foley:  Yes, and it is still needed Mobility:  bed rest  PT consulted: N/A Last date of multidisciplinary goals of care discussion [Per Primary] Code Status:  full code Disposition: ICU  Critical care time: 41 minutes   Tim Lair, PA-C [facility] 10/03/20 9:11 AM  Please see [email or web address] for pager details.  From 7A-7P if no response, please call [phone] After hours, please call E-Link [phone]

## 2020-10-03 NOTE — Progress Notes (Signed)
TCTS BRIEF SICU PROGRESS NOTE  10 Days Post-Op  S/P Procedure(s) (LRB): CORONARY ARTERY BYPASS GRAFTING (CABG)  TIMES 4 USING LEFT GREATER SAPHENOUS VEIN HARVESTED ENDOSCOPICALLY AND LEFT INTERNAL MAMMARY ARTERY (N/A) TRANSESOPHAGEAL ECHOCARDIOGRAM (TEE) (N/A)   Stable day although reportedly did poorly w/ SBT  Plan: Continue current plan  Purcell Nails, MD 10/03/2020 6:51 PM

## 2020-10-03 NOTE — Progress Notes (Addendum)
Advanced Heart Failure Rounding Note   Subjective:    Underwent emergent CABG on 5/6 for critical LM disease (LIMA -> LAD, SVG -> OM, SVG -> RCA) 5/6 Cultures negative.  5/8 Possible aspiration. A fib --> started amio drip.  Given 2u PRBCs. Hypotensive in the evening and started on vasopressin + antibiotics.  5/9 IABP removed 5/10 NG placed >1.5 out  5/11 started neostigmime 5/12 Febrile 102   Remains on vent. FiO2 40%. Will awaken and follow some commands.      Remains on milrinone 0.25. Norepi off.  Co-ox 67%    Good diuresis yesterday w/ IV Lasix -4.2L out. Wt down 2 lb. CVP 6. SCr 2.37>>2.48. BUN 88>>100.  K 3.7    On zosyn Tm 100.4  Objective:   Weight Range:  Vital Signs:   Temp:  [98.4 F (36.9 C)-100.4 F (38 C)] 99.8 F (37.7 C) (05/16 0400) Pulse Rate:  [105-120] 110 (05/16 0355) Resp:  [0-35] 30 (05/16 0700) BP: (99-169)/(56-79) 133/59 (05/16 0700) SpO2:  [95 %-98 %] 98 % (05/16 0700) Arterial Line BP: (146-272)/(51-257) 151/56 (05/15 1300) FiO2 (%):  [40 %] 40 % (05/16 0355) Weight:  [77.6 kg] 77.6 kg (05/16 0515) Last BM Date: 09/29/20  Weight change: Filed Weights   10/01/20 0550 10/02/20 0500 10/03/20 0515  Weight: 77.8 kg 78.8 kg 77.6 kg    Intake/Output:   Intake/Output Summary (Last 24 hours) at 10/03/2020 0754 Last data filed at 10/03/2020 0700 Gross per 24 hour  Intake 3559.4 ml  Output 4475 ml  Net -915.6 ml     PHYSICAL EXAM: CVP 6  General:  intubated. Awake on vent  HEENT: + ETT, + cortrak  Neck: supple. no JVD. Carotids 2+ bilat; no bruits. No lymphadenopathy or thyromegaly appreciated. Cor: PMI nondisplaced. Regular rate & rhythm. No rubs, gallops or murmurs. Lungs: intubated and clear  Abdomen: soft, nontender, nondistended. No hepatosplenomegaly. No bruits or masses. Good bowel sounds. Extremities: no cyanosis, clubbing, rash, edema Neuro: intubated, awake on vent. Responding to some commands.     Telemetry: sinus tach  110s Personally reviewed  Labs: Basic Metabolic Panel: Recent Labs  Lab 09/28/20 0542 09/28/20 1656 09/29/20 0602 09/30/20 0017 09/30/20 1556 10/01/20 0015 10/01/20 0319 10/01/20 1307 10/02/20 0355 10/03/20 0424  NA 138   < > 140 144 142  --  142 143 145 147*  K 3.1*   < > 3.2* 2.4* 3.0* 3.2* 3.0* 3.4* 3.6 3.7  CL 101   < > 99 102 100  --  98 100 105 112*  CO2 23   < > $R'23 29 30  'BE$ --  31 34* 30 29  GLUCOSE 96   < > 143* 195* 162*  --  182* 174* 181* 160*  BUN 74*   < > 77* 80* 80*  --  84* 85* 88* 100*  CREATININE 3.33*   < > 3.46* 2.92* 2.76*  --  2.71* 2.58* 2.37* 2.48*  CALCIUM 7.8*   < > 7.9* 8.4* 8.2*  --  8.3* 8.5* 8.7* 8.5*  MG 2.5*  --  2.2 2.4  --   --   --  2.1  --  2.2  PHOS  --   --  6.7* 4.4  --   --   --   --   --  1.5*   < > = values in this interval not displayed.    Liver Function Tests: Recent Labs  Lab 09/29/20 0602 09/30/20 4944 10/03/20 0424  AST 29 29 42*  ALT $Re'9 7 17  'woV$ ALKPHOS 114 117 72  BILITOT 1.1 0.8 1.0  PROT 5.1* 5.3* 5.4*  ALBUMIN 1.6* 1.6* 1.7*   No results for input(s): LIPASE, AMYLASE in the last 168 hours. No results for input(s): AMMONIA in the last 168 hours.  CBC: Recent Labs  Lab 09/29/20 0602 09/30/20 0337 10/01/20 0319 10/02/20 0355 10/03/20 0424  WBC 11.1* 11.0* 11.9* 12.9* 12.1*  NEUTROABS  --  9.8*  --   --  9.5*  HGB 9.2* 9.2* 8.9* 8.9* 8.2*  HCT 27.9* 28.2* 28.0* 28.9* 26.9*  MCV 81.8 81.5 83.3 85.5 87.1  PLT 95* 90* 109* 140* 181    Cardiac Enzymes: No results for input(s): CKTOTAL, CKMB, CKMBINDEX, TROPONINI in the last 168 hours.  BNP: BNP (last 3 results) Recent Labs    09/23/20 0311  BNP 1,191.8*    ProBNP (last 3 results) No results for input(s): PROBNP in the last 8760 hours.    Other results:  Imaging: DG Chest Port 1 View  Result Date: 10/02/2020 CLINICAL DATA:  Status post open heart surgery. EXAM: PORTABLE CHEST 1 VIEW COMPARISON:  09/30/2020 FINDINGS: The ET tube tip is above the  carina. Left IJ catheter tip is in the projection of the right atrium. Feeding tube tip is below the field of view as well as the level of the GE junction. Status post median sternotomy. There is diffuse pulmonary vascular congestion. Small pleural effusions with mild bibasilar atelectasis. IMPRESSION: 1. Small bilateral pleural effusions and bibasilar atelectasis. 2. Pulmonary vascular congestion. Electronically Signed   By: Kerby Moors M.D.   On: 10/02/2020 08:24     Medications:     Scheduled Medications: . amiodarone  200 mg Per Tube BID  . aspirin  324 mg Per Tube Daily  . bisacodyl  10 mg Rectal Daily  . chlorhexidine gluconate (MEDLINE KIT)  15 mL Mouth Rinse BID  . Chlorhexidine Gluconate Cloth  6 each Topical Daily  . feeding supplement (PIVOT 1.5 CAL)  1,000 mL Per Tube Q24H  . free water  30 mL Per Tube Q4H  . heparin injection (subcutaneous)  5,000 Units Subcutaneous Q8H  . hydrALAZINE  25 mg Per Tube Q8H  . insulin aspart  0-24 Units Subcutaneous Q4H  . insulin glargine  8 Units Subcutaneous BID  . ipratropium-albuterol  3 mL Nebulization Q6H  . isosorbide dinitrate  10 mg Per Tube TID  . mouth rinse  15 mL Mouth Rinse 10 times per day  . metoCLOPramide (REGLAN) injection  5 mg Intravenous Q6H  . neostigmine  0.25 mg Subcutaneous Q6H  . pantoprazole (PROTONIX) IV  40 mg Intravenous Q24H  . phosphorus  250 mg Per Tube Once  . potassium chloride  20 mEq Per Tube Once  . sodium chloride flush  10-40 mL Intracatheter Q12H  . sodium chloride flush  3 mL Intravenous Q12H    Infusions: . dexmedetomidine (PRECEDEX) IV infusion Stopped (10/02/20 0646)  . fentaNYL infusion INTRAVENOUS Stopped (10/01/20 0024)  . lactated ringers 10 mL/hr at 10/01/20 1900  . lactated ringers Stopped (10/01/20 4132)  . milrinone 0.25 mcg/kg/min (10/03/20 0700)  . norepinephrine (LEVOPHED) Adult infusion Stopped (09/30/20 0826)  . piperacillin-tazobactam (ZOSYN)  IV 12.5 mL/hr at 10/03/20 0700   . TPN ADULT (ION) 95 mL/hr at 10/03/20 0700    PRN Medications: acetaminophen (TYLENOL) oral liquid 160 mg/5 mL, albuterol, fentaNYL (SUBLIMAZE) injection, lip balm, metoprolol tartrate, midazolam, ondansetron (ZOFRAN) IV, sodium chloride flush,  sodium chloride flush   Assessment/Plan:   1. 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  - Co-ox 67% on milrinone 0.25. Will continue. Off NE - Weight well below baseline. CVP 6  - No ACE/ARB/ARNI/MRA with AKI. No b-blocker yet with shock.  - c/w hydral/nitrates for afterload reduction   2. NSTEMI with critical CAD - 95% LM and high grade ostial RCA - CABG on 5/6 for critical LM disease (LIMA -> LAD, SVG -> OM, SVG -> RCA) - Continue ASA/statin  - Add Plavix soon   3. Acute hypoxic respiratory failure due to pulmonary edema - on vent - Possible aspiration 5/8 & 5/10  - 5/6 Blood CX - NG - On antibiotics  - CCM following - SBT agan today. Mental status remains major issue. May need trach to enable wean    4. AKI  - due to ATN - Cr 1.0 -> 1.7 -> 1.95-->2.3 ->3->3.3->3.5->2.9-> 2.8 -> 2.7 -> 2.37->2.48  - Support hemodynamics.  - continue to follow and watch BUN   5. Ventral Hernia  - 5/10 NG placed to decompress   6. ID - On zosyn for possible aspiration.  - Blood CX 5/6- NGTD - TMax 100.4   Length of Stay: [place] PA-C 10/03/2020, 7:54 AM  Advanced Heart Failure Team Pager [phone] (M-F; 7a - 4p)  Please contact [facility] for night-coverage after hours (4p -7a ) and weekends on [email or web address]   Agree with above.   Remains intubated. Awake on vent but still agitated. Co-ox 67% on milrinone 0.25. CVP low. Creatinine up slightly after diuresis   General:  Awake on vent. Follows some commands HEENT: normal + ETT + cor-trak Neck:  supple. no JVD. Carotids 2+ bilat; no bruits. No lymphadenopathy or thryomegaly appreciated. Cor: Sternal wound ok  Regular rate & rhythm. No rubs, gallops or murmurs. Lungs: coarse Abdomen: soft, nontender, nondistended. No hepatosplenomegaly. No bruits or masses. + large ventral hernia  Extremities: no cyanosis, clubbing, rash, edema Neuro: as above  Optimized from HF perspective. Would continue milrinone. Hold diuretics. Mental status main issue for extubation. Will d/w CCM. Continue TPN and TFs.   CRITICAL CARE Performed by: Glori Bickers  Total critical care time: 35 minutes  Critical care time was exclusive of separately billable procedures and treating other patients.  Critical care was necessary to treat or prevent imminent or life-threatening deterioration.  Critical care was time spent personally by me (independent of midlevel providers or residents) on the following activities: development of treatment plan with patient and/or surrogate as well as nursing, discussions with consultants, evaluation of patient's response to treatment, examination of patient, obtaining history from patient or surrogate, ordering and performing treatments and interventions, ordering and review of laboratory studies, ordering and review of radiographic studies, pulse oximetry and re-evaluation of patient's condition.  Glori Bickers, MD  8:52 AM

## 2020-10-03 NOTE — Progress Notes (Signed)
Pharmacy Antibiotic Note  Jack Vance is a 80 y.o. male admitted on 09/23/2020 with mvCAD. Pharmacy has been consulted for Zosyn dosing with concerns for aspiration PNA.  Cr is stable, no useful culture data.   Plan: Continue Zosyn 3.375g IV q 8 hrs EI Watch Cr closely  Height: 5\' 6"  (167.6 cm) Weight: 77.6 kg (171 lb 1.2 oz) IBW/kg (Calculated) : 63.8  Temp (24hrs), Avg:99.7 F (37.6 C), Min:98.4 F (36.9 C), Max:100.4 F (38 C)  Recent Labs  Lab 09/26/20 0810 09/27/20 0518 09/29/20 0602 09/30/20 0337 09/30/20 1556 10/01/20 0319 10/01/20 1307 10/02/20 0355 10/03/20 0424  WBC  --    < > 11.1* 11.0*  --  11.9*  --  12.9* 12.1*  CREATININE  --    < > 3.46* 2.92* 2.76* 2.71* 2.58* 2.37* 2.48*  LATICACIDVEN 2.4*  --   --   --   --   --   --   --   --    < > = values in this interval not displayed.    Estimated Creatinine Clearance: 24.1 mL/min (A) (by C-G formula based on SCr of 2.48 mg/dL (H)).    Allergies  Allergen Reactions  . Cheese     Causes asthma  . Morphine And Related Rash   10/05/20, PharmD, BCPS, [facility] Clinical Pharmacist [phone] Please check AMION for all [facility] Pharmacy numbers 10/03/2020

## 2020-10-03 NOTE — Progress Notes (Addendum)
Nutrition Follow Up  DOCUMENTATION CODES:   Not applicable  INTERVENTION:   Continue TPN to meet 100% of needs until enteral nutrition can be advanced and tolerance is established   No BM x 3 days- suppository given    Replace phosphorus  Continue tube feeding  -Pivot 1.5 @ 20 ml/hr via Cortrak -Increase per tolerance to goal rate of 60 ml/hr (1440 ml) -ProSource TF 45 ml BID -Free water 30 ml Q4 hours   Goal rate provides: 2240 kcals, 157 grams protein, 1093 ml free water (1273 ml with flushes)  NUTRITION DIAGNOSIS:   Increased nutrient needs related to post-op healing as evidenced by estimated needs.  Ongoing  GOAL:   Patient will meet greater than or equal to 90% of their needs   Meeting with TPN  MONITOR:   Vent status,Labs,I & O's,Skin,TF tolerance,Weight trends  REASON FOR ASSESSMENT:   Consult Enteral/tube feeding initiation and management  ASSESSMENT:   Patient with PMH significant for DDD, asthma, HTN, and large ventral hernia. Presented to [facility] ED with shortness of breath, was found to be in cardiogenic shock with NSTEMI.   5/06- s/p heart cath, confirmed critical CAD, s/p emergent CABG x3 wih IABP placement 5/08- large vomiting episode, TF held 5/09- s/p IABP removed, trickle TF started  5/10- large vomiting episode, TF held 5/11- Cortrak tube advanced to ligament of Treitz 5/12- TPN started  5/14- Pivot 1.5 restarted at 10 ml/hr   Pt discussed during ICU rounds and with RN.   Off pressors. Failed SBT given tachypnea. Noted new DTI of buttocks. Now hypernatremic, D5 started. No BM since enema four days ago. Per RN, suppository was given this am. Continues with Reglan. Pivot 1.5 increased to 20 ml/hr. Recommend continued slow increase to goal.   TPN running at 95 ml/hr to provide 137 grams protein and 2178 kcal. Continue to meet 100% of needs until enteral nutrition can be advanced and tolerance is established.   Admission weight: 83 kg  Current  weight: 77.6 kg   Patient remains intubated on ventilator support MV: 16.6 L/min Temp (24hrs), Avg:100.4 F (38 C), Min:98.4 F (36.9 C), Max:103.3 F (39.6 C)  UOP: 4175 ml x 24 hrs NG: 300 ml x 24 hrs (bilious)  Drips: precedex, D5 @ 50 ml/hr  Medications: dulcolax, SS novolog, lantus, 5 mg reglan QID Labs: Na 147 (H) BUN 100 Cr 2.48-up from yesterday Phosphorus 1.5 (L)  Diet Order:   Diet Order    None      EDUCATION NEEDS:   Not appropriate for education at this time  Skin:  Skin Assessment: Skin Integrity Issues: Skin Integrity Issues:: DTI DTI: buttocks Incisions: bilateral legs, chest  Last BM:  5/12  Height:   Ht Readings from Last 1 Encounters:  09/23/20 5\' 6"  (1.676 m)    Weight:   Wt Readings from Last 1 Encounters:  10/03/20 77.6 kg   BMI:  Body mass index is 27.61 kg/m.  Estimated Nutritional Needs:   Kcal:  10/05/20 kcal  Protein:  135-180 grams  Fluid:  >/= 2 L/day  [id number] RD, LDN Clinical Nutrition Pager listed in AMION

## 2020-10-03 NOTE — Plan of Care (Signed)
  Problem: Education: Goal: Knowledge of disease or condition will improve Outcome: Progressing   Problem: Cardiac: Goal: Will achieve and/or maintain hemodynamic stability Outcome: Progressing   Problem: Clinical Measurements: Goal: Postoperative complications will be avoided or minimized Outcome: Progressing   Problem: Skin Integrity: Goal: Wound healing without signs and symptoms of infection Outcome: Progressing Goal: Risk for impaired skin integrity will decrease Outcome: Progressing   Problem: Respiratory: Goal: Respiratory status will improve Outcome: Progressing   Problem: Urinary Elimination: Goal: Ability to achieve and maintain adequate renal perfusion and functioning will improve Outcome: Progressing   Problem: Education: Goal: Knowledge of General Education information will improve Description: Including pain rating scale, medication(s)/side effects and non-pharmacologic comfort measures Outcome: Progressing

## 2020-10-03 NOTE — Progress Notes (Signed)
[address]       [place] [address]             [phone]                 10 Days Post-Op Procedure(s) (LRB): CORONARY ARTERY BYPASS GRAFTING (CABG)  TIMES 4 USING LEFT GREATER SAPHENOUS VEIN HARVESTED ENDOSCOPICALLY AND LEFT INTERNAL MAMMARY ARTERY (N/A) TRANSESOPHAGEAL ECHOCARDIOGRAM (TEE) (N/A)   Events: No events Follows more commands  _______________________________________________________________ Vitals: BP 131/62   Pulse (!) 119   Temp 99.8 F (37.7 C) (Oral)   Resp (!) 29   Ht 5\' 6"  (1.676 m)   Wt 77.6 kg   SpO2 98%   BMI 27.61 kg/m   - Neuro: arousable  - Cardiovascular: Normal sinus rhythm.  I  Drips:  milrinone at 0.25 CVP:  [0 mmHg-8 mmHg] 2 mmHg  - Pulm:  Vent Mode: PRVC FiO2 (%):  [40 %] 40 % Set Rate:  [22 bmp] 22 bmp Vt Set:  [560 mL] 560 mL PEEP:  [5 cmH20] 5 cmH20 Plateau Pressure:  [20 cmH20-28 cmH20] 22 cmH20  ABG    Component Value Date/Time   PHART 7.410 09/26/2020 0539   PCO2ART 31.1 (L) 09/26/2020 0539   PO2ART 136 (H) 09/26/2020 0539   HCO3 19.9 (L) 09/26/2020 0539   TCO2 21 (L) 09/26/2020 0539   ACIDBASEDEF 4.0 (H) 09/26/2020 0539   O2SAT 67.1 10/03/2020 0425    - Abd: soft. - Extremity: Warm  .Intake/Output      05/15 0701 05/16 0700 05/16 0701 05/17 0700   I.V. (mL/kg) 2768.2 (35.7) 101.3 (1.3)   NG/GT 650 120   IV Piggyback 141.2 12.5   Total Intake(mL/kg) 3559.4 (45.9) 233.8 (3)   Urine (mL/kg/hr) 4175 (2.2) 210 (1.6)   Emesis/NG output 300    Total Output 4475 210   Net -915.6 +23.8           _______________________________________________________________ Labs: CBC Latest Ref Rng & Units 10/03/2020 10/02/2020 10/01/2020  WBC 4.0 - 10.5 K/uL 12.1(H) 12.9(H) 11.9(H)  Hemoglobin 13.0 - 17.0 g/dL 8.2(L) 8.9(L) 8.9(L)  Hematocrit 39.0 - 52.0 % 26.9(L) 28.9(L) 28.0(L)  Platelets 150 - 400 K/uL 181 140(L) 109(L)   CMP Latest Ref Rng & Units 10/03/2020 10/02/2020 10/01/2020  Glucose 70 - 99  mg/dL 10/03/2020) 149(F) 026(V)  BUN 8 - 23 mg/dL 785(Y) 850(Y) 77(A)  Creatinine 0.61 - 1.24 mg/dL 12(I) 7.86(V) 6.72(C)  Sodium 135 - 145 mmol/L 147(H) 145 143  Potassium 3.5 - 5.1 mmol/L 3.7 3.6 3.4(L)  Chloride 98 - 111 mmol/L 112(H) 105 100  CO2 22 - 32 mmol/L 29 30 34(H)  Calcium 8.9 - 10.3 mg/dL 9.47(S) 9.6(G) 8.3(M)  Total Protein 6.5 - 8.1 g/dL 6.2(H) - -  Total Bilirubin 0.3 - 1.2 mg/dL 1.0 - -  Alkaline Phos 38 - 126 U/L 72 - -  AST 15 - 41 U/L 42(H) - -  ALT 0 - 44 U/L 17 - -    CXR: pending   _______________________________________________________________  Assessment and Plan: POD 10 s/p emergency CABG.  Neuro: Wean sedation as tolerated. CV:  on milr.  Will wean as tolerted Pulm: Wean vent as tolerated.  Good respiratory mechanics.  Will attempt extubation today. Renal: creat trending down.  Net neg since admission GI: On TPN and trickle tube feeds.  NG output less bilious.   Heme: Stable ID: DVT studies negative.  On Zosyn.  Endo: Sliding scale insulin. Dispo:  Continue ICU care.   Corliss Skains 10/03/2020 8:43 AM

## 2020-10-03 NOTE — Progress Notes (Signed)
PHARMACY - TOTAL PARENTERAL NUTRITION CONSULT NOTE   Indication: Prolonged ileus  Patient Measurements: Height: 5\' 6"  (167.6 cm) Weight: 77.6 kg (171 lb 1.2 oz) IBW/kg (Calculated) : 63.8 TPN AdjBW (KG): 68.6 Body mass index is 27.61 kg/m.  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 Patient with ileus post-op, prolonged NPO status. Cortrak placed 5/11. Trickle tube were attempted x 2 (5/7-5/8, 5/9-5/11) and held both times with vomiting. Pharmacy consulted to start TPN.  Patient currently intubated, sedated in the ICU on fentanyl/Precedex/Versed, continuing on support with milrinone and norepinephrine. Also on amiodarone drip per TCTS.  Glucose / Insulin: No previous hx DM. A1c 6.3% on 09/25/20. CBGs 124-197. Insulin drip off, utilized 20 units SSI + Lantus 8 units bid in last 24hrs Electrolytes: Na incr 147, K 3.7 (received 80 meq yesterday and Lasix 60 mg IV; goal >/=4 with ileus), Phos 1.5, CoCa down 10.3 (decr in TPN 5/14); others WNL Renal: AKI - SCr up 2.48 (SCr 1 on 5/6), BUN up to 100. Lasix drip stopped 5/14, net -6.9L Hepatic: mild AST increase / Tbili WNL, TG down 162. Albumin 1.7, prealbumin up 14.7 Intake / Output; MIVF: UOP 2.3 ml/kg/hr, NGT output up 300 ml/24hrs (less bilious per note), +BM s/p SMOG enema 5/12  GI Imaging: none since TPN start GI Surgeries / Procedures: none since TPN start  Central access: CVC triple lumen placed 09/24/20 (removed 5/13); triple lumen PICC placed 09/30/20 TPN start date: 09/29/20  Nutritional Goals (per RD recommendation on 09/29/20): kCal: 11/29/20, Protein: 135-180g, Fluid: >/=2 L/day  Goal TPN rate is 95 mL/hr (provides 137 g of protein and 2178 kcals per day) Tube feed regimen provides 2240 kcal and 157 g protein  Current Nutrition:  NPO and TPN  Pivot 1.5 at 10 ml/hr (trickle feeds)  Plan:  Unable to concentrate TPN due to national  shortage of Clinisol.  Continue TPN at goal rate of 95 mL/hr at 1800.  TPN will provide 137g protein and 2178 kCal, meeting 100% of patient needs Electrolytes in TPN: continue standard lytes except reduced Na/Phos/Ca - decr Na 20mEq/L, K 47mEq/L, removed Ca 5/15, Mg 23mEq/L, and reduced Phos to 5 mmol/L. Change Cl:Ac 1:1 K Phos Neutral 250 mg per tube x1 KCl 20 meq per tube x1 Add standard MVI and trace elements + thiamine and folic acid per dietitian recommendation to TPN Continue TCTS q4h SSI for now and adjust as needed Lantus 8 units SQ bid - adjustment per MD Monitor TPN labs, for possible refeeding F/u plans for extubation, toleration of TF/diet and ability to wean TPN  Thank you for involving pharmacy in this patient's care.  4m, PharmD, BCPS Clinical Pharmacist Clinical phone for 10/03/2020 until 3p is 10/05/2020 10/03/2020 7:20 AM  **Pharmacist phone directory can be found on [email or web address] listed under [facility]**

## 2020-10-04 ENCOUNTER — Inpatient Hospital Stay (HOSPITAL_COMMUNITY): Payer: No Typology Code available for payment source

## 2020-10-04 DIAGNOSIS — J9601 Acute respiratory failure with hypoxia: Secondary | ICD-10-CM | POA: Diagnosis not present

## 2020-10-04 DIAGNOSIS — T17800A Unspecified foreign body in other parts of respiratory tract causing asphyxiation, initial encounter: Secondary | ICD-10-CM | POA: Diagnosis not present

## 2020-10-04 DIAGNOSIS — K567 Ileus, unspecified: Secondary | ICD-10-CM | POA: Diagnosis not present

## 2020-10-04 DIAGNOSIS — R570 Cardiogenic shock: Secondary | ICD-10-CM | POA: Diagnosis not present

## 2020-10-04 DIAGNOSIS — I5021 Acute systolic (congestive) heart failure: Secondary | ICD-10-CM | POA: Diagnosis not present

## 2020-10-04 DIAGNOSIS — Z9911 Dependence on respirator [ventilator] status: Secondary | ICD-10-CM | POA: Diagnosis not present

## 2020-10-04 LAB — GLUCOSE, CAPILLARY
Glucose-Capillary: 159 mg/dL — ABNORMAL HIGH (ref 70–99)
Glucose-Capillary: 165 mg/dL — ABNORMAL HIGH (ref 70–99)
Glucose-Capillary: 169 mg/dL — ABNORMAL HIGH (ref 70–99)
Glucose-Capillary: 188 mg/dL — ABNORMAL HIGH (ref 70–99)
Glucose-Capillary: 203 mg/dL — ABNORMAL HIGH (ref 70–99)
Glucose-Capillary: 209 mg/dL — ABNORMAL HIGH (ref 70–99)

## 2020-10-04 LAB — BASIC METABOLIC PANEL
Anion gap: 8 (ref 5–15)
BUN: 123 mg/dL — ABNORMAL HIGH (ref 8–23)
CO2: 23 mmol/L (ref 22–32)
Calcium: 7.8 mg/dL — ABNORMAL LOW (ref 8.9–10.3)
Chloride: 120 mmol/L — ABNORMAL HIGH (ref 98–111)
Creatinine, Ser: 2.71 mg/dL — ABNORMAL HIGH (ref 0.61–1.24)
GFR, Estimated: 23 mL/min — ABNORMAL LOW (ref >60)
Glucose, Bld: 212 mg/dL — ABNORMAL HIGH (ref 70–99)
Potassium: 4.9 mmol/L (ref 3.5–5.1)
Sodium: 151 mmol/L — ABNORMAL HIGH (ref 135–145)

## 2020-10-04 LAB — CBC
HCT: 26.9 % — ABNORMAL LOW (ref 39.0–52.0)
Hemoglobin: 7.9 g/dL — ABNORMAL LOW (ref 13.0–17.0)
MCH: 29.5 pg (ref 26.0–34.0)
MCHC: 29.4 g/dL — ABNORMAL LOW (ref 30.0–36.0)
MCV: 100.4 fL — ABNORMAL HIGH (ref 80.0–100.0)
Platelets: 196 10*3/uL (ref 150–400)
RBC: 2.68 MIL/uL — ABNORMAL LOW (ref 4.22–5.81)
RDW: 20.6 % — ABNORMAL HIGH (ref 11.5–15.5)
WBC: 9 10*3/uL (ref 4.0–10.5)
nRBC: 0 % (ref 0.0–0.2)

## 2020-10-04 LAB — COMPREHENSIVE METABOLIC PANEL
ALT: 24 U/L (ref 0–44)
AST: 38 U/L (ref 15–41)
Albumin: 1.9 g/dL — ABNORMAL LOW (ref 3.5–5.0)
Alkaline Phosphatase: 65 U/L (ref 38–126)
Anion gap: 6 (ref 5–15)
BUN: 115 mg/dL — ABNORMAL HIGH (ref 8–23)
CO2: 25 mmol/L (ref 22–32)
Calcium: 8.1 mg/dL — ABNORMAL LOW (ref 8.9–10.3)
Chloride: 121 mmol/L — ABNORMAL HIGH (ref 98–111)
Creatinine, Ser: 2.7 mg/dL — ABNORMAL HIGH (ref 0.61–1.24)
GFR, Estimated: 23 mL/min — ABNORMAL LOW (ref >60)
Glucose, Bld: 176 mg/dL — ABNORMAL HIGH (ref 70–99)
Potassium: 4.7 mmol/L (ref 3.5–5.1)
Sodium: 152 mmol/L — ABNORMAL HIGH (ref 135–145)
Total Bilirubin: 1.1 mg/dL (ref 0.3–1.2)
Total Protein: 6.1 g/dL — ABNORMAL LOW (ref 6.5–8.1)

## 2020-10-04 LAB — COOXEMETRY PANEL
Carboxyhemoglobin: 1 % (ref 0.5–1.5)
Methemoglobin: 1.8 % — ABNORMAL HIGH (ref 0.0–1.5)
O2 Saturation: 74.5 %
Total hemoglobin: 9.3 g/dL — ABNORMAL LOW (ref 12.0–16.0)

## 2020-10-04 MED ORDER — FENTANYL CITRATE (PF) 100 MCG/2ML IJ SOLN
100.0000 ug | Freq: Once | INTRAMUSCULAR | Status: AC
  Administered 2020-10-04: 100 ug via INTRAVENOUS

## 2020-10-04 MED ORDER — MIDAZOLAM HCL 2 MG/2ML IJ SOLN
2.0000 mg | Freq: Once | INTRAMUSCULAR | Status: AC
  Administered 2020-10-04: 2 mg via INTRAVENOUS

## 2020-10-04 MED ORDER — TRAVASOL 10 % IV SOLN
INTRAVENOUS | Status: AC
  Filled 2020-10-04: qty 1368

## 2020-10-04 MED ORDER — POLYETHYLENE GLYCOL 3350 17 G PO PACK
17.0000 g | PACK | Freq: Two times a day (BID) | ORAL | Status: DC
  Administered 2020-10-04 – 2020-10-11 (×14): 17 g
  Filled 2020-10-04 (×13): qty 1

## 2020-10-04 MED ORDER — SORBITOL 70 % SOLN: 30.0000 mL | Freq: Every day | Status: DC | PRN

## 2020-10-04 MED ORDER — DEXTROSE 50 % IV SOLN: 12.5000 g | Freq: Once | INTRAVENOUS | Status: DC

## 2020-10-04 MED ORDER — SODIUM CHLORIDE 0.9 % IV SOLN
510.0000 mg | Freq: Once | INTRAVENOUS | Status: AC
  Administered 2020-10-04: 510 mg via INTRAVENOUS
  Filled 2020-10-04: qty 17

## 2020-10-04 MED ORDER — FREE WATER
100.0000 mL | Status: DC
  Administered 2020-10-04 – 2020-10-05 (×6): 100 mL

## 2020-10-04 MED ORDER — ROCURONIUM BROMIDE 10 MG/ML (PF) SYRINGE
PREFILLED_SYRINGE | INTRAVENOUS | Status: AC
  Administered 2020-10-04: 100 mg via INTRAVENOUS
  Filled 2020-10-04: qty 10

## 2020-10-04 MED ORDER — ROCURONIUM BROMIDE 10 MG/ML (PF) SYRINGE: 100.0000 mg | PREFILLED_SYRINGE | Freq: Once | INTRAVENOUS | Status: AC

## 2020-10-04 MED ORDER — SORBITOL 70 % SOLN
30.0000 mL | Freq: Once | Status: AC
  Administered 2020-10-04: 30 mL
  Filled 2020-10-04: qty 30

## 2020-10-04 NOTE — Discharge Summary (Addendum)
Physician Discharge Summary       [address]       [place] [address]             [phone]    Patient ID: Jack Vance MRN: [id number] DOB/AGE: [birth year] 80 y.o.  Admit date: 09/23/2020 Discharge date: 10/22/2020  Admission Diagnoses: 1. NSTEMI (non-ST elevated myocardial infarction) ([place]) 2. Cardiogenic shock, acute systolic heart failure 3. Coronary artery disease 4. Acute hypoxic respiratory failure due to pulmonary edema  Discharge Diagnoses:  1.  S/P emergent CABG x 3 2. Expected post op blood loss anemia 3. AKI 4. History of asthma 5. History of DDD (degenerative disc disease), lumbar 6. History of morbid obesity ([place]) 7. History of hypertension 8. History of RLS (restless legs syndrome) 9. History of hernia of abdominal wall    Consults: Advanced heart failure, Nephrology, CCM/Pulmonary  Procedure (s):  CABG X 3, LIMA LAD, reversesaphenousveingraft to distal right, reverse saphenous vein graft to ramus Endoscopic greater saphenous vein harvest on the left by Dr. Kipp Brood on 09/23/2020. Percutaneous Tracheostomy with Bronchoscopic Guidance by Dr. Valeta Harms on 10/10/2020.  History of Presenting Illness: This is a 80 year old male transferred from [facility] following an NSTEMI and cardiogenic shock.  Records are obtained from chart review and discussion with primary physicians.  He underwent a left heart cath which showed severe left main disease as well as ostial right coronary artery disease, and his echocardiogram shows reduced biventricular function.  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  An intra-aortic balloon pump was placed in the Cath Lab and the patient is being brought emergently to the OR for revascularization.  Brief Hospital Course:  As dictated by Enid Cutter PA-C:  Jack Vance was taken to the operating room urgently where three-vessel coronary  bypass grafting was accomplished.  The left anterior mammary artery was grafted to the left anterior descending coronary artery.  Saphenous vein was grafted to the distal right and to the ramus coronary arteries.  Following the procedure, uneventfully.  Intra-aortic balloon pump support, which had been initiated prior to surgery, was continued postoperatively.  He was transferred to the surgical ICU in guarded condition on epinephrine and Levophed infusions.  He was followed closely in the ICU by the advanced heart failure team.  On postop day 1, he remained on mechanical ventilatory support.  He was hypotensive on postop day 1 and was started on norepinephrine and milrinone infusions.  He was noted to have significant right ventricular failure.  Decision was made to forego extubation until the following day.  On postop day 2, bedside echocardiography demonstrated ejection fraction of around 30% severe septal hypokinesis.  There was mildly decreased RV systolic function.  The intra-aortic balloon pump was gradually weaned and removed without event.  Chest tube drainage tapered off and chest tubes were removed on postop day 2.  He developed atrial fibrillation on postop day 2 and was treated with amiodarone protocol.  He continued to require mechanical ventilatory support.  Chest x-ray showed pulmonary edema and bibasilar atelectasis.  Possibility of aspiration pneumonia was considered.  He was started on IV antibiotic coverage accordingly.  He developed acute kidney injury felt to be due to acute tubular necrosis.  Nutritional support was initiated on postop day 3 by way of a nasogastric Cortrak. feeding tube.  He developed abdominal distention over the next 24 hours with an episode of vomiting.  An NG tube was placed for gastric decompression.  Renal function worsened with creatinine rising to 3.  He was started on a Lasix drip and had good response.  Portable abdominal x-ray obtained on 09/28/2020 showed  fluid-filled loops of small bowel raising the question of ileus or enteritis.  There was no radiographic evidence of obstruction.  Renal function and hemodynamics gradually improved over the next few days. Urine output was adequate.  He was weaned from vasopressor support.   Addendum: He remained intubated/sedated. Pulmonary/CCM continued to manage vent. He had AKI post op. Creatinine initially improved but then increased to 2.7 on 05/17. Per nephrology, no need for HD at this time. Diuretics were held in part to this and CVP less than 5. He developed a fever to 100.6 on 05/16. He was previously on Zosyn (for possible aspiration) but this was stopped and WBC was decreased to 9000. Previous blood cultures show no growth to date. He remained on Milrinone and co ox was monitored daily. This was stopped on 05/20. Patient has VDRF and failed SBT 05/21.He required a trach, which was done on Monday 05/23. Patient has a history of alcohol abuse. He was agitated and had delirium post op. He was on Seroquel, Klonopin, and Valproate. He was able to follow some commands over the weekend of 05/21 and throughout the week. As of 05/26, he was on Plavix 745 mg daily, Isordil 10 mg tid, Hydralazine 37.5 mg tid, and Amiodarone 200 mg daily. PMV in line trial was done with speech therapy on 05/26. Unfortunately, patient's kidney functioned worsened on 05/27 and creatinine increased to 2.43. His creatine plateaued at 3.05 (likley due to ATN) on 05/30 . He had urinary retention the evening of 05/30 and was straight cath'd with 1600 cc out. He also had hypernatremia and sodium was up to 151. This did decrease with free water to 139 on 05/30. Patient had his trach exchanged on 05/30 due to cuff leak. His creatinine continued to trend down. Both heart failure and Dr. Kipp Brood agreed to transfer to [facility] today.   Latest Vital Signs: Blood pressure 114/63, pulse 76, temperature 97.7 F (36.5 C), temperature source Oral, resp. rate  20, height _0  (1.676 m), weight 79 kg, SpO2 97 %.  Physical Exam:  General:  Chronically ill appearing WM w/ trach.  No distress. Awake but slightly agitated. Will follow some commands HEENT: normal Neck: supple. + trach no JVD. Carotids 2+ bilat; no bruits. No lymphadenopathy or thryomegaly appreciated. Cor: PMI nondisplaced. Regular rate & rhythm. No rubs, gallops or murmurs. Lungs: clear Abdomen: soft, nontender, nondistended. No hepatosplenomegaly. No bruits or masses. Good bowel sounds. Extremities: no cyanosis, clubbing, rash, edema Neuro: agitated  Discharge Condition: Stable and discharged to [facility]  Recent laboratory studies:  Lab Results  Component Value Date   WBC 9.8 11/01/2020   HGB 7.5 (L) 11/10/2020   HCT 24.8 (L) 11/06/2020   MCV 92.5 10/21/2020   PLT 278 11/02/2020   Lab Results  Component Value Date   NA 150 (H) 11/15/2020   K 4.4 11/01/2020   CL 112 (H) 11/07/2020   CO2 29 11/16/2020   CREATININE 2.17 (H) 11/13/2020   GLUCOSE 151 (H) 10/26/2020      Diagnostic Studies: DG Chest 1 View  Result Date: 10/17/2020 CLINICAL DATA:  Follow-up patient with tracheostomy tube. Respiratory distress. EXAM: CHEST  1 VIEW COMPARISON:  10/10/2020 and older studies. FINDINGS: Tracheostomy tube is well position. Enteric tube passes below the diaphragm and  below the included field of view. Nasal/orogastric tube present on the prior exam has been removed. Stable changes from prior CABG surgery. Cardiac silhouette mildly enlarged. No mediastinal or hilar masses. Bilateral interstitial thickening. Bilateral lung base opacities obscuring the right and mostly obscuring the left hemidiaphragm is consistent with pleural effusions and dependent atelectasis. Lung base opacities have improved compared to the prior study. No pneumothorax. IMPRESSION: 1. Improved basilar opacities since the prior study, consistent with a decrease in pleural effusions and atelectasis. 2. Persistent bilateral  interstitial thickening, presumed chronic. No new lung abnormalities. 3. Well-positioned tracheostomy tube.  Stable enteric tube. Electronically Signed   By: Lajean Manes M.D.   On: 10/17/2020 09:24   DG Chest 1 View  Result Date: 09/28/2020 CLINICAL DATA:  Recent open heart surgery, ET tube EXAM: CHEST  1 VIEW COMPARISON:  09/27/2020 FINDINGS: Support devices remain in stable position. Postoperative changes from CABG. Cardiomegaly with vascular congestion. Bibasilar opacities, likely atelectasis. Suspect small effusions. IMPRESSION: Support devices stable. Cardiomegaly with vascular congestion. Suspect small effusions with bibasilar atelectasis. Electronically Signed   By: Rolm Baptise M.D.   On: 09/28/2020 08:53   DG Chest 1 View  Result Date: 09/27/2020 CLINICAL DATA:  ET tube EXAM: CHEST  1 VIEW COMPARISON:  09/26/2020 FINDINGS: Support devices remain in place, unchanged. Cardiomegaly, vascular congestion. Bilateral airspace disease similar to prior study. No visible significant effusions or pneumothorax. IMPRESSION: Stable support devices. Bilateral airspace disease could reflect edema or infection. Electronically Signed   By: Rolm Baptise M.D.   On: 09/27/2020 10:00   DG Chest 2 View  Result Date: 09/23/2020 CLINICAL DATA:  Shortness of breath, asthma EXAM: CHEST - 2 VIEW COMPARISON:  Radiograph 01/08/2006 FINDINGS: Extensive multifocal patchy opacities present throughout both lungs including more coalescent density in the right infrahilar lung and left basilar periphery. No pneumothorax. Suspect small bilateral effusions. Prominence of the cardiac silhouette though may be accentuated by portable technique. Pulmonary vascularity is redistributed indistinct. Telemetry leads overlie the chest. Degenerative changes are present in the imaged spine and shoulders. Prior right humeral ORIF without acute complication. IMPRESSION: Multifocal heterogeneous opacities throughout both lungs, could reflect a  multifocal infection (including atypical etiologies such as COVID 19) and/or asymmetric edema with additional features of heart failure including a prominent cardiac silhouette, effusions or pulmonary vascular congestion. Electronically Signed   By: Lovena Le M.D.   On: 09/23/2020 04:34   DG Abd 1 View  Result Date: 09/27/2020 CLINICAL DATA:  Ileus EXAM: ABDOMEN - 1 VIEW COMPARISON:  09/26/2020 FINDINGS: Gas-filled, nondistended loops of small bowel in the central abdomen measuring up to 3.5 cm in caliber. Non weighted enteric feeding tube projects with tip below the diaphragm in the vicinity of the pylorus. No obvious free air on single supine radiograph which incompletely covers the abdomen. IMPRESSION: Gas-filled, nondistended loops of small bowel in the central abdomen measuring up to 3.5 cm in caliber. No overt evidence of ileus or bowel obstruction. Electronically Signed   By: Eddie Candle M.D.   On: 09/27/2020 08:36   DG Abd 1 View  Result Date: 09/26/2020 CLINICAL DATA:  Aspiration, ileus, post CABG EXAM: ABDOMEN - 1 VIEW COMPARISON:  Portable exam 0523 hours compared to 09/25/2020 FINDINGS: Tip of nasogastric tube projects over distal gastric antrum. Few air-filled loops of nondistended bowel in RIGHT upper quadrant. Paucity of bowel gas throughout remainder of abdomen. No urinary tract calcification or acute osseous findings. IMPRESSION: Nonspecific bowel gas pattern. Electronically Signed  By: Lavonia Dana M.D.   On: 09/26/2020 08:11   DG Abd 1 View  Result Date: 09/25/2020 CLINICAL DATA:  Ileus. EXAM: ABDOMEN - 1 VIEW COMPARISON:  One-view chest x-ray 09/25/2020 and 09/24/2020. FINDINGS: Mildly dilated loops of bowel are present in the central abdomen. Bowel gas pattern is otherwise unremarkable. No definite free air is present on the supine images. Side port of the NG tube is in the stomach. Inferior Swan-Ganz catheter terminates distally in the right pulmonary artery, unchanged. Bilateral  pleural effusions noted. IMPRESSION: 1. Mildly dilated loops of bowel in the central abdomen compatible with ileus. 2. Bilateral pleural effusions. Electronically Signed   By: San Morelle M.D.   On: 09/25/2020 09:01   CT HEAD WO CONTRAST  Result Date: 10/06/2020 CLINICAL DATA:  80 year old male with history of delirium. Cardiogenic shock. EXAM: CT HEAD WITHOUT CONTRAST TECHNIQUE: Contiguous axial images were obtained from the base of the skull through the vertex without intravenous contrast. COMPARISON:  No priors. FINDINGS: Brain: Mild cerebral atrophy. Patchy and confluent areas of decreased attenuation are noted throughout the deep and periventricular white matter of the cerebral hemispheres bilaterally, compatible with chronic microvascular ischemic disease. No evidence of acute infarction, hemorrhage, hydrocephalus, extra-axial collection or mass lesion/mass effect. Vascular: No hyperdense vessel or unexpected calcification. Skull: Normal. Negative for fracture or focal lesion. Sinuses/Orbits: Multifocal mucosal thickening throughout the paranasal sinuses with some air-fluid levels noted in the sphenoid sinuses bilaterally. Status post bilateral maxillary antrectomy. Other: None. IMPRESSION: 1. No acute intracranial abnormalities. 2. Mild cerebral atrophy with chronic microvascular ischemic changes in the cerebral white matter, as above. 3. Changes of chronic sinus disease with air-fluid levels in the sphenoid sinuses bilaterally, which could indicate acute sinus infection as well. Electronically Signed   By: Vinnie Langton M.D.   On: 10/06/2020 11:52   CARDIAC CATHETERIZATION  Result Date: 09/23/2020  Ost RCA lesion is 80% stenosed.  Ost LM to Mid LM lesion is 90% stenosed.  1st Diag lesion is 60% stenosed.  Ost Cx to Prox Cx lesion is 90% stenosed.  LV end diastolic pressure is severely elevated.  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  Right and left  heart catheterization 09/23/2020: RA 10/9, mean 9 mmHg. RV 50/7, EDP 11 mmHg PA 48/26, mean 36 mmHg.  PA saturation 69%. PW 26/34, mean 26 mmHg.  Aortic saturation 100%. Cardiac output 8.1, cardiac index 4.21 by Fick.  Patient on the ventilator. LV: 108/15, EDP 27 mmHg.  There was no pressure gradient across the aortic valve. LM: Irregular, ulcerated, 90% ostial stenosis and proximal stenosis.  Extends into the circumflex coronary artery. LAD: Very minimal disease.  D1 is large, proximal 50 to 60% stenosis. RI: Ostium has 50 to 60% stenosis. CX: Ulcerated ostial 90% stenosis. RCA: Ostium 80% stenosis.  Severe damping with engagement of the artery with no backflow. Impression: Patient in cardiogenic shock however cardiac output and index are maintained on pressor support, has surgical disease and has been evaluated by Dr. Kipp Brood and will be taken emergently for CABG.  Intra-aortic balloon pump was placed for hemodynamic support and stability.  Further recommendations to follow.  I have discussed with his sister Diane on the phone.  20 mL contrast utilized to limit contrast nephropathy in a patient with cardiogenic shock. I appreciate Dr. Pierre Bali assisting me with management of patient was seen cardiogenic shock and critically ill.  US RENAL  Result Date: 10/17/2020 CLINICAL DATA:  Acute kidney injury EXAM: RENAL /  URINARY TRACT ULTRASOUND COMPLETE COMPARISON:  CT abdomen/pelvis dated 12/03/2017 FINDINGS: Right Kidney: Renal measurements: 10.6 x 4.8 x 5.0 cm = volume: 132 mL. Echogenicity within normal limits. No mass or hydronephrosis visualized. Left Kidney: Renal measurements: 11.1 x 5.9 x 5.7 cm = volume: 193 mL. Echogenicity within normal limits. 5 mm interpolar calculus. Mild hydronephrosis. Bladder: Appears normal for degree of bladder distention. Other: None. IMPRESSION: 5 mm interpolar left renal calculus with mild left hydronephrosis. Electronically Signed   By: Julian Hy M.D.   On:  10/17/2020 10:18   DG Chest Port 1 View  Result Date: 10/18/2020 CLINICAL DATA:  80 year old male with respiratory failure. Tracheostomy. EXAM: PORTABLE CHEST 1 VIEW COMPARISON:  Portable chest 10/17/2020 and earlier. FINDINGS: Portable AP semi upright view at 0431 hours. Stable mild patient rotation to the right. Tracheostomy tube appears stable. Enteric feeding tube courses to the abdomen, tip not included. There is a new battery or generator device projecting at the right thoracic inlet of unclear etiology and significance. Large lung volumes. Stable cardiac size and mediastinal contours. Prior CABG. Asymmetric increased reticular opacity in the left lung with pleural thickening and/or small pleural effusion on that side. Mild contralateral right pleural thickening suspected but otherwise when allowing for portable technique the right lung is clear. Right lung ventilation has improved since 10/10/2020. Ventilation is stable from 10/17/2020. Previous right humerus ORIF.  Stable visualized osseous structures. IMPRESSION: 1. Stable lines and tubes. 2. Stable ventilation since yesterday with possible left lung infection in the form of asymmetric reticular opacity and pleural thickening or small left effusion. Electronically Signed   By: Genevie Ann M.D.   On: 10/18/2020 05:23   DG Chest Port 1 View  Result Date: 10/10/2020 CLINICAL DATA:  Status post tracheostomy placement EXAM: PORTABLE CHEST 1 VIEW COMPARISON:  10/09/2020 FINDINGS: Tracheostomy tube is now seen in satisfactory position. Gastric catheter and feeding catheter are noted. Left-sided PICC line is again seen and stable. Cardiac shunt eyes is stable. Small effusions and basilar atelectasis are seen bilaterally. IMPRESSION: Tracheostomy tube in satisfactory position. Bibasilar atelectasis and effusions. Electronically Signed   By: Inez Catalina M.D.   On: 10/10/2020 12:23   DG CHEST PORT 1 VIEW  Result Date: 10/09/2020 CLINICAL DATA:  Respiratory  failure.  Ventilator dependent. EXAM: PORTABLE CHEST 1 VIEW COMPARISON:  10/06/2020 FINDINGS: Cardiomegaly, CABG changes, endotracheal tube with tip 4 cm above the carina, NG/OG and feeding tubes entering the UPPER abdomen with tips off the field of view, and LEFT PICC line with tip overlying the SUPERIOR cavoatrial junction again noted. Mild interstitial opacities and trace bilateral pleural effusions are again noted. Mild bibasilar atelectasis again identified. There is no evidence of pneumothorax. IMPRESSION: Unchanged appearance of the chest with mild interstitial opacities, trace bilateral pleural effusions and bibasilar atelectasis. Support apparatus as described. Electronically Signed   By: Margarette Canada M.D.   On: 10/09/2020 08:16   DG CHEST PORT 1 VIEW  Result Date: 10/06/2020 CLINICAL DATA:  Intubation. EXAM: PORTABLE CHEST 1 VIEW COMPARISON:  10/04/2020.  10/02/2020.  01/08/2006. FINDINGS: Endotracheal tube, NG tube, left PICC line in stable position. Prior CABG. Heart size stable. Bilateral mild interstitial prominence suggesting interstitial edema again noted. Tiny bilateral pleural effusions again noted. No pneumothorax. Postsurgical changes right humerus. IMPRESSION: 1.  Lines and tubes in stable position. 2.  Prior CABG.  Heart size stable. 3. Bilateral mild interstitial prominence suggesting interstitial edema again noted. No interim change. Tiny bilateral pleural effusions again noted  without interim change. Electronically Signed   By: Marcello Moores  Register   On: 10/06/2020 06:12   DG CHEST PORT 1 VIEW  Result Date: 10/04/2020 CLINICAL DATA:  Endotracheal tube placement. EXAM: PORTABLE CHEST 1 VIEW COMPARISON:  10/02/2020 FINDINGS: Endotracheal tube is in place with tip approximately 4 centimeters above the carina. LEFT-sided PICC line tip overlies the superior vena cava. Of feeding tube and nasogastric tube replacement tips beyond the edge of the image. The heart size is normal. Patchy  interstitial infiltrates are again identified throughout the lungs, LEFT greater than RIGHT. Postop changes in the RIGHT shoulder. IMPRESSION: Stable appearance of the chest. Persistent bilateral infiltrates LEFT greater than RIGHT. Electronically Signed   By: Nolon Nations M.D.   On: 10/04/2020 14:51   DG Chest Port 1 View  Result Date: 10/02/2020 CLINICAL DATA:  Status post open heart surgery. EXAM: PORTABLE CHEST 1 VIEW COMPARISON:  09/30/2020 FINDINGS: The ET tube tip is above the carina. Left IJ catheter tip is in the projection of the right atrium. Feeding tube tip is below the field of view as well as the level of the GE junction. Status post median sternotomy. There is diffuse pulmonary vascular congestion. Small pleural effusions with mild bibasilar atelectasis. IMPRESSION: 1. Small bilateral pleural effusions and bibasilar atelectasis. 2. Pulmonary vascular congestion. Electronically Signed   By: Kerby Moors M.D.   On: 10/02/2020 08:24   DG CHEST PORT 1 VIEW  Result Date: 09/30/2020 CLINICAL DATA:  CABG, respiratory dependent.  Pleural effusions. EXAM: PORTABLE CHEST 1 VIEW COMPARISON:  Chest x-ray dated 09/28/2020 and 09/27/2020. FINDINGS: Heart size and mediastinal contours are stable. RIGHT IJ Cordis has been removed. Endotracheal tube and enteric tube appear appropriately positioned. LEFT-sided PICC line has been placed and appears well positioned with tip at the level of the lower SVC. Probable mild interstitial edema. Probable small bilateral pleural effusions and/or mild bibasilar atelectasis. IMPRESSION: 1. Probable small bilateral pleural effusions and/or mild bibasilar atelectasis. 2. Probable mild interstitial edema. 3. Support apparatus appears well positioned. Electronically Signed   By: Franki Cabot M.D.   On: 09/30/2020 15:25   DG Chest Port 1 View  Result Date: 09/26/2020 CLINICAL DATA:  Post CABG, aspiration, ileus, intubation EXAM: PORTABLE CHEST 1 VIEW COMPARISON:   Portable exam 0517 hours compared to 09/25/2020 FINDINGS: Tip of endotracheal tube projects 3.1 cm above carina. Nasogastric tube extends into stomach. Swan-Ganz catheter tip projects over RIGHT hilum. RIGHT jugular central venous catheter tip projects over SVC. External pacing leads present. Enlargement of cardiac silhouette with pulmonary vascular congestion. Mild pulmonary infiltrates favoring pulmonary edema. Bibasilar atelectasis and probable small RIGHT pleural effusion. No pneumothorax. IMPRESSION: Bibasilar atelectasis and probable small RIGHT pleural effusion. Enlargement of cardiac silhouette with vascular congestion and minimal pulmonary edema. Electronically Signed   By: Lavonia Dana M.D.   On: 09/26/2020 08:10   DG CHEST PORT 1 VIEW  Result Date: 09/25/2020 CLINICAL DATA:  Possible aspiration. EXAM: PORTABLE CHEST 1 VIEW COMPARISON:  Sep 25, 2020 (6:32 a.m.) FINDINGS: There is stable endotracheal tube, nasogastric tube and right internal jugular venous catheter positioning. Stable Swan-Ganz catheter positioning is also noted. Multiple sternal wires are seen. Chronic appearing increased lung markings are seen. There is no evidence of a pleural effusion or pneumothorax. The cardiac silhouette is mildly enlarged. A radiopaque fixation plate and screws are seen within the proximal right humerus. IMPRESSION: No significant interval change when compared to the prior study, dated Sep 25, 2020 (6:32 a.m.). Electronically Signed  By: Virgina Norfolk M.D.   On: 09/25/2020 19:53   DG CHEST PORT 1 VIEW  Result Date: 09/25/2020 CLINICAL DATA:  Follow-up.  Recent CABG surgery. EXAM: PORTABLE CHEST 1 VIEW COMPARISON:  09/24/2020 and earlier exams. FINDINGS: On stable changes from recent CABG surgery. No mediastinal widening. Patchy interstitial and hazy airspace lung opacities are noted, with more confluent opacity in the left medial lung base, appearance similar to the exams from 05/07 and 09/23/2020. No  pneumothorax. Swan-Ganz catheter tip projects in the peripheral right pulmonary artery. Right internal jugular central venous line, endotracheal tube and nasal/orogastric tube are stable and well positioned. IMPRESSION: 1. No change in the bilateral interstitial and hazy airspace lung opacities favored to reflect pulmonary edema. No new lung abnormalities. 2. No pneumothorax. 3. Tip of the Swan-Ganz catheter appears more peripheral than on the prior exams. No other change in support apparatus. Electronically Signed   By: Lajean Manes M.D.   On: 09/25/2020 08:25   DG Chest Port 1 View  Result Date: 09/24/2020 CLINICAL DATA:  Status post three-vessel CABG EXAM: PORTABLE CHEST 1 VIEW COMPARISON:  Chest radiograph from one day prior. FINDINGS: Endotracheal tube tip is 2.5 cm above the carina. Enteric tube enters stomach with the tip not seen on this image. Right internal jugular central venous catheter terminates in the middle third of the SVC. Inferior approach Swan-Ganz catheter terminates over the right pulmonary artery. Intact sternotomy wires. Stable mediastinal drain and bibasilar chest tubes. Stable cardiomediastinal silhouette with mild cardiomegaly. No pneumothorax. No significant pleural effusions. Mild patchy hazy and reticular opacities throughout both lungs, similar. IMPRESSION: 1. No pneumothorax. Well-positioned support structures. 2. Stable mild cardiomegaly with mild patchy hazy and reticular opacities in both lungs, favor mild pulmonary edema superimposed on underlying nonspecific pulmonary fibrosis. Electronically Signed   By: Ilona Sorrel M.D.   On: 09/24/2020 08:32   DG Chest Port 1 View  Result Date: 09/23/2020 CLINICAL DATA:  Status post coronary bypass grafting EXAM: PORTABLE CHEST 1 VIEW COMPARISON:  Film from earlier in the same day. FINDINGS: Postsurgical changes are now seen. Endotracheal tube, gastric catheter, Swan-Ganz catheter and pericardial drain are seen mediastinal drain is  noted as well. Intra-aortic balloon pump is noted in satisfactory position at the level of the aortic arch. Patchy airspace opacity is noted but improved from the previous exam. No pneumothorax is seen. Right jugular catheter is noted as well. IMPRESSION: Patchy but improved airspace opacities bilaterally. Tubes and lines as described above. Electronically Signed   By: Inez Catalina M.D.   On: 09/23/2020 19:14   DG Chest Portable 1 View  Result Date: 09/23/2020 CLINICAL DATA:  Intubation.  Burning chest pain for 2 days EXAM: PORTABLE CHEST 1 VIEW COMPARISON:  Earlier today FINDINGS: Endotracheal tube with tip between the clavicular heads and carina. Progressive airspace disease which is asymmetric to the right. Borderline heart size. No effusion or pneumothorax. IMPRESSION: 1. New endotracheal tube in unremarkable position. 2. Progressive airspace disease with asymmetry to the right suggesting infection. Electronically Signed   By: Monte Fantasia M.D.   On: 09/23/2020 09:16   DG Abd Portable 1V  Result Date: 09/29/2020 CLINICAL DATA:  Enteric tube placement. EXAM: PORTABLE ABDOMEN - 1 VIEW COMPARISON:  Abdominal radiograph dated 09/28/2020. FINDINGS: An enteric tube terminates in the stomach near the pylorus. A feeding tube terminates in the distal duodenum/proximal jejunum, unchanged in positioning accounting for patient rotation. IMPRESSION: Enteric tube terminating in the stomach near the pylorus. Feeding tube  not significantly changed in position. Electronically Signed   By: Zerita Boers M.D.   On: 09/29/2020 12:18   DG Abd Portable 1V  Result Date: 09/28/2020 CLINICAL DATA:  Feeding tube placement EXAM: PORTABLE ABDOMEN - 1 VIEW COMPARISON:  Portable exam 1617 hours compared to 1002 hours FINDINGS: Patient is severely rotated to the RIGHT. Feeding tube appears to traverse the stomach and duodenum with tip potentially at ligament of Treitz position, suboptimally assessed due to rotation. Visualized  bowel gas pattern normal. Tip of nasogastric tube in stomach. IMPRESSION: Significant patient rotation to RIGHT limits exam. Tip of feeding tube is probably at the region of ligament of Treitz. Electronically Signed   By: Lavonia Dana M.D.   On: 09/28/2020 16:38   DG Abd Portable 1V  Result Date: 09/28/2020 CLINICAL DATA:  Recent surgery with nasogastric and feeding tubes present EXAM: PORTABLE ABDOMEN - 1 VIEW COMPARISON:  Sep 27, 2020 FINDINGS: Nasogastric tube tip and side port in stomach. Feeding tube tip is at the pylorus level. There is fluid throughout much of the bowel. There is no bowel dilatation or air-fluid level to suggest bowel obstruction. No free air evident on supine examination. IMPRESSION: Enteric tubes as noted. Most bowel loops are fluid-filled. While this finding can be seen normally, it raises question of a degree of ileus or enteritis. Bowel obstruction not felt to be likely. No free air evident on supine examination. Electronically Signed   By: Lowella Grip III M.D.   On: 09/28/2020 11:41   DG Abd Portable 1V  Result Date: 09/27/2020 CLINICAL DATA:  OG tube placement EXAM: PORTABLE ABDOMEN - 1 VIEW COMPARISON:  09/27/2020 FINDINGS: Enteric tube is in place with the tip in the stomach. Feeding tube tip is in the right upper quadrant which could be within the distal stomach or proximal duodenum. IMPRESSION: Enteric tube and feeding tube position as above. Electronically Signed   By: Rolm Baptise M.D.   On: 09/27/2020 10:00   DG Abd Portable 1V  Result Date: 09/26/2020 CLINICAL DATA:  Feeding tube placement EXAM: PORTABLE ABDOMEN - 1 VIEW COMPARISON:  Portable exam 1046 hours compared to 0523 hours FINDINGS: Rotated exam. Tip of feeding tube projects over descending duodenum. Bibasilar lung opacities and questionable small RIGHT pleural effusion. Swan-Ganz catheter projects over RIGHT perihilar region. Visualized bowel gas pattern unremarkable. IMPRESSION: Tip of feeding tube  projects over descending duodenum. Electronically Signed   By: Lavonia Dana M.D.   On: 09/26/2020 11:04   ECHOCARDIOGRAM COMPLETE  Result Date: 09/23/2020    ECHOCARDIOGRAM REPORT   Patient Name:   Jack Vance [facility] Date of Exam: 09/23/2020 Medical Rec #:  [id number]     Height:       66.0 in Accession #:    [id number]    Weight:       183.0 lb Date of Birth:  February 28, 1941    BSA:          1.926 m Patient Age:    65 years      BP:           153/97 mmHg Patient Gender: M             HR:           117 bpm. Exam Location:  Inpatient Procedure: 2D Echo, Cardiac Doppler and Color Doppler Indications:     Acute Myocardial Infarction I21.9  History:         Patient has no prior history of Echocardiogram  examinations.  Sonographer:     Jonelle Sidle Dance Referring Phys:  [id number] Renee Pain Diagnosing Phys: Vernell Leep MD IMPRESSIONS  1. Left ventricular ejection fraction, by estimation, is <20%. The left ventricle has severely decreased function. Severe global hypokinesis with akinesis of anterolateral wall. Grade II diastolic dysfunction (pseudonormalization). Elevated left atrial pressure.  2. Right ventricular systolic function is moderately reduced. The right ventricular size is normal. There is moderately elevated pulmonary artery systolic pressure.  3. Left atrial size was mildly dilated.  4. The mitral valve is grossly normal. Mild to moderate mitral valve regurgitation.  5. Tricuspid valve regurgitation is moderate. Peak RA-RV gradient 41 mmHg. FINDINGS  Left Ventricle: Left ventricular ejection fraction, by estimation, is <20%. The left ventricle has severely decreased function. The left ventricle demonstrates regional wall motion abnormalities. The left ventricular internal cavity size was normal in size. There is no left ventricular hypertrophy. Left ventricular diastolic parameters are consistent with Grade II diastolic dysfunction (pseudonormalization). Elevated left atrial pressure. Right Ventricle: The right  ventricular size is normal. No increase in right ventricular wall thickness. Right ventricular systolic function is moderately reduced. There is moderately elevated pulmonary artery systolic pressure. The tricuspid regurgitant velocity is 3.22 m/s, and with an assumed right atrial pressure of 8 mmHg, the estimated right ventricular systolic pressure is 17.4 mmHg. Left Atrium: Left atrial size was mildly dilated. Right Atrium: Right atrial size was normal in size. Pericardium: There is no evidence of pericardial effusion. Mitral Valve: The mitral valve is grossly normal. Mild to moderate mitral valve regurgitation. Tricuspid Valve: The tricuspid valve is grossly normal. Tricuspid valve regurgitation is moderate. Aortic Valve: The aortic valve is tricuspid. There is mild aortic valve annular calcification. Aortic valve regurgitation is not visualized. Aortic valve mean gradient measures 5.0 mmHg. Aortic valve peak gradient measures 10.8 mmHg. Aortic valve area, by VTI measures 1.57 cm. Pulmonic Valve: The pulmonic valve was grossly normal. Pulmonic valve regurgitation is not visualized. Aorta: The aortic root and ascending aorta are structurally normal, with no evidence of dilitation. Venous: IVC assessment for right atrial pressure unable to be performed due to mechanical ventilation. IAS/Shunts: No atrial level shunt detected by color flow Doppler.  LEFT VENTRICLE PLAX 2D LVIDd:         4.70 cm LVIDs:         4.00 cm LV PW:         1.00 cm LV IVS:        1.20 cm LVOT diam:     2.00 cm LV SV:         38 LV SV Index:   20 LVOT Area:     3.14 cm  RIGHT VENTRICLE          IVC RV Basal diam:  3.00 cm  IVC diam: 2.30 cm TAPSE (M-mode): 1.6 cm LEFT ATRIUM             Index       RIGHT ATRIUM           Index LA diam:        5.10 cm 2.65 cm/m  RA Area:     15.20 cm LA Vol (A2C):   66.1 ml 34.32 ml/m RA Volume:   42.50 ml  22.07 ml/m LA Vol (A4C):   77.4 ml 40.19 ml/m LA Biplane Vol: 76.3 ml 39.62 ml/m  AORTIC VALVE  AV Area (Vmax):    1.70 cm AV Area (Vmean):   1.60 cm AV Area (VTI):  1.57 cm AV Vmax:           164.00 cm/s AV Vmean:          107.000 cm/s AV VTI:            0.242 m AV Peak Grad:      10.8 mmHg AV Mean Grad:      5.0 mmHg LVOT Vmax:         88.65 cm/s LVOT Vmean:        54.350 cm/s LVOT VTI:          0.121 m LVOT/AV VTI ratio: 0.50  AORTA Ao Root diam: 3.40 cm Ao Asc diam:  3.20 cm MITRAL VALVE                TRICUSPID VALVE MV Area (PHT): 5.66 cm     TR Peak grad:   41.5 mmHg MV Decel Time: 134 msec     TR Vmax:        322.00 cm/s MV E velocity: 128.50 cm/s                             SHUNTS                             Systemic VTI:  0.12 m                             Systemic Diam: 2.00 cm Vernell Leep MD Electronically signed by Vernell Leep MD Signature Date/Time: 09/23/2020/10:15:02 AM    Final    ECHO INTRAOPERATIVE TEE  Result Date: 09/30/2020  *INTRAOPERATIVE TRANSESOPHAGEAL REPORT *  Patient Name:   Jack Vance [facility] Date of Exam: 09/23/2020 Medical Rec #:  [id number]     Height:       66.0 in Accession #:    [id number]    Weight:       183.0 lb Date of Birth:  01-26-1941    BSA:          1.93 m Patient Age:    50 years      BP: Patient Gender: M             HR: Transesophogeal exam was perform intraoperatively during surgical procedure. Patient was closely monitored under general anesthesia during the entirety of examination. Performing Phys: [id number] HARRELL O LIGHTFOOT POST-OP IMPRESSIONS - Left Ventricle: Post Bypass The patient came off bypass on the initial attempt. Left ventricular contraction did improve with volume replacement. There did not appear to be any new findings from preop exam. The TEE that had been placed uneventfully after induction was removed at the end of the case without difficulty. The patient was later taken to the SICU in stable condition. PRE-OP FINDINGS  Left Ventricle: The left ventricle has severely reduced systolic function, with an ejection fraction of  20-30%. Left Atrium: Left atrial size was dilated. Mitral Valve: The mitral valve is normal in structure. Mitral valve regurgitation is moderate by color flow Doppler. Tricuspid Valve: Tricuspid valve regurgitation is moderate by color flow Doppler. Aortic Valve: The aortic valve is tricuspid. +-------------+-----------++ MR Peak grad:53.0 mmHg   +-------------+-----------++ MR Mean grad:35.0 mmHg   +-------------+-----------++ MR Vmax:     364.00 cm/s +-------------+-----------++ MR Vmean:    281.0 cm/s  +-------------+-----------++  Belinda Block MD Electronically signed by Belinda Block MD Signature Date/Time:  09/30/2020/3:41:58 PM   Final    VAS [place] LOWER EXTREMITY VENOUS (DVT)  Result Date: 09/30/2020  Lower Venous DVT Study Patient Name:  Jack Vance [facility]  Date of Exam:   09/29/2020 Medical Rec #: [id number]      Accession #:    [id number] Date of Birth: [birth year]     Patient Gender: M Patient Age:   79Y Exam Location:  [facility] [place] Procedure:      VAS [place] LOWER EXTREMITY VENOUS (DVT) Referring Phys: [id number] AMY D CLEGG --------------------------------------------------------------------------------  Indications: Swelling.  Limitations: Bandaging right proximal thigh, s/p cath and poor ultrasound/tissue interface. Comparison Study: No prior studies. Performing Technologist: Darlin Coco RDMS,RVT  Examination Guidelines: A complete evaluation includes B-mode imaging, spectral Doppler, color Doppler, and power Doppler as needed of all accessible portions of each vessel. Bilateral testing is considered an integral part of a complete examination. Limited examinations for reoccurring indications may be performed as noted. The reflux portion of the exam is performed with the patient in reverse Trendelenburg.  +---------+---------------+---------+-----------+----------+-------------------+ RIGHT    CompressibilityPhasicitySpontaneityPropertiesThrombus Aging       +---------+---------------+---------+-----------+----------+-------------------+ CFV                                                   Unable to visualize                                                       due to bandaging    +---------+---------------+---------+-----------+----------+-------------------+ FV Prox  Full                                                             +---------+---------------+---------+-----------+----------+-------------------+ FV Mid   Full                                                             +---------+---------------+---------+-----------+----------+-------------------+ FV DistalFull           Yes      Yes                                      +---------+---------------+---------+-----------+----------+-------------------+ PFV                                                   Not visualized      +---------+---------------+---------+-----------+----------+-------------------+ POP      Full           Yes      Yes                                      +---------+---------------+---------+-----------+----------+-------------------+  PTV      Full                                                             +---------+---------------+---------+-----------+----------+-------------------+ PERO     Full                                                             +---------+---------------+---------+-----------+----------+-------------------+   +---------+---------------+---------+-----------+----------+-------------------+ LEFT     CompressibilityPhasicitySpontaneityPropertiesThrombus Aging      +---------+---------------+---------+-----------+----------+-------------------+ CFV      Full           Yes      Yes                                      +---------+---------------+---------+-----------+----------+-------------------+ SFJ      Full                                                              +---------+---------------+---------+-----------+----------+-------------------+ FV Prox  Full                                                             +---------+---------------+---------+-----------+----------+-------------------+ FV Mid   Full                                                             +---------+---------------+---------+-----------+----------+-------------------+ FV DistalFull                                                             +---------+---------------+---------+-----------+----------+-------------------+ PFV      Full                                                             +---------+---------------+---------+-----------+----------+-------------------+ POP                     Yes      Yes                  Unable to compress,  limited patient                                                           mobility            +---------+---------------+---------+-----------+----------+-------------------+ PTV      Full                                                             +---------+---------------+---------+-----------+----------+-------------------+ PERO     Full                                                             +---------+---------------+---------+-----------+----------+-------------------+     Summary: RIGHT: - There is no evidence of deep vein thrombosis in the lower extremity. However, portions of this examination were limited- see technologist comments above.  - No cystic structure found in the popliteal fossa.  LEFT: - There is no evidence of deep vein thrombosis in the lower extremity. However, portions of this examination were limited- see technologist comments above.  - No cystic structure found in the popliteal fossa.  *See table(s) above for measurements and observations. Electronically signed by Jamelle Haring on 09/30/2020 at 10:37:57 AM.     Final    [place] EKG SITE RITE  Result Date: 09/30/2020 If Site Rite image not attached, placement could not be confirmed due to current cardiac rhythm.    Discharge Medications: Allergies as of 11/12/2020      Reactions   Cheese    Causes asthma   Morphine And Related Rash      Medication List    STOP taking these medications   acetaminophen 325 MG tablet Commonly known as: TYLENOL Replaced by: acetaminophen 160 MG/5ML solution   aspirin EC 81 MG tablet Replaced by: aspirin 81 MG chewable tablet   benzonatate 100 MG capsule Commonly known as: TESSALON   diclofenac Sodium 1 % Gel Commonly known as: VOLTAREN   ferrous gluconate 324 MG tablet Commonly known as: FERGON   finasteride 5 MG tablet Commonly known as: PROSCAR   FLUoxetine 20 MG capsule Commonly known as: PROZAC   fluticasone 50 MCG/ACT nasal spray Commonly known as: FLONASE   fluticasone-salmeterol 250-50 MCG/ACT Aepb Commonly known as: ADVAIR   ibuprofen 200 MG tablet Commonly known as: ADVIL   Ipratropium-Albuterol 20-100 MCG/ACT Aers respimat Commonly known as: COMBIVENT   lidocaine 5 % Commonly known as: LIDODERM   montelukast 10 MG tablet Commonly known as: SINGULAIR   multivitamin capsule   omeprazole 40 MG capsule Commonly known as: PRILOSEC   tamsulosin 0.4 MG Caps capsule Commonly known as: FLOMAX   Tiotropium Bromide Monohydrate 2.5 MCG/ACT Aers   traMADol 50 MG tablet Commonly known as: ULTRAM   triamterene-hydrochlorothiazide 37.5-25 MG tablet Commonly known as: MAXZIDE-25     TAKE these medications   acetaminophen 160 MG/5ML solution Commonly known as: TYLENOL Place 20.3 mLs (650 mg total) into feeding tube every  8 (eight) hours as needed for fever. Replaces: acetaminophen 325 MG tablet   albuterol (2.5 MG/3ML) 0.083% nebulizer solution Commonly known as: PROVENTIL Take 3 mLs (2.5 mg total) by nebulization every 4 (four) hours as needed for wheezing or shortness of  breath.   amiodarone 200 MG tablet Commonly known as: PACERONE Place 1 tablet (200 mg total) into feeding tube daily. Start taking on: October 21, 2020   aspirin 81 MG chewable tablet Place 1 tablet (81 mg total) into feeding tube daily. Start taking on: October 21, 2020 Replaces: aspirin EC 81 MG tablet   atorvastatin 80 MG tablet Commonly known as: LIPITOR Place 1 tablet (80 mg total) into feeding tube daily. Start taking on: October 21, 2020 What changed: how to take this   chlorhexidine gluconate (MEDLINE KIT) 0.12 % solution Commonly known as: PERIDEX 15 mLs by Mouth Rinse route 2 (two) times daily.   clopidogrel 75 MG tablet Commonly known as: PLAVIX Place 1 tablet (75 mg total) into feeding tube daily. Start taking on: October 21, 2020   doxazosin 4 MG tablet Commonly known as: CARDURA Place 1 tablet (4 mg total) into feeding tube daily. Start taking on: October 21, 2020   feeding supplement (PIVOT 1.5 CAL) Liqd Place 1,000 mLs into feeding tube continuous.   folic acid 1 MG tablet Commonly known as: FOLVITE Place 1 tablet (1 mg total) into feeding tube daily. Start taking on: October 21, 2020   free water Soln Place 300 mLs into feeding tube every 4 (four) hours.   Gerhardt's butt cream Crea Apply 1 application topically as needed for irritation.   heparin 5000 UNIT/ML injection Inject 1 mL (5,000 Units total) into the skin every 8 (eight) hours.   hydrALAZINE 25 MG tablet Commonly known as: APRESOLINE Place 1.5 tablets (37.5 mg total) into feeding tube every 8 (eight) hours.   insulin aspart 100 UNIT/ML injection Commonly known as: novoLOG Inject 0-24 Units into the skin every 4 (four) hours.   insulin glargine 100 UNIT/ML injection Commonly known as: LANTUS Inject 0.05 mLs (5 Units total) into the skin 2 (two) times daily.   isosorbide dinitrate 10 MG tablet Commonly known as: ISORDIL Place 1 tablet (10 mg total) into feeding tube 3 (three) times daily.   lactated  ringers infusion Inject 10 mLs into the vein continuous.   lip balm ointment Apply topically as needed for lip care.   mouth rinse Liqd solution 15 mLs by Mouth Rinse route daily.   ondansetron 4 MG/2ML Soln injection Commonly known as: ZOFRAN Inject 2 mLs (4 mg total) into the vein every 6 (six) hours as needed for nausea or vomiting.   oxyCODONE 5 MG/5ML solution Commonly known as: ROXICODONE Place 10 mLs (10 mg total) into feeding tube every 6 (six) hours as needed for moderate pain.   pantoprazole sodium 40 mg/20 mL Pack Commonly known as: PROTONIX Place 20 mLs (40 mg total) into feeding tube daily. Start taking on: October 21, 2020   polyethylene glycol 17 g packet Commonly known as: MIRALAX / GLYCOLAX Place 17 g into feeding tube daily. Start taking on: October 21, 2020   QUEtiapine 50 MG tablet Commonly known as: SEROQUEL Place 1 tablet (50 mg total) into feeding tube 2 (two) times daily.   rOPINIRole 0.5 MG tablet Commonly known as: REQUIP Place 1 tablet (0.5 mg total) into feeding tube at bedtime. What changed:   medication strength  how much to take  how to take this  when to take this   sodium chloride flush 0.9 % Soln Commonly known as: NS 10-40 mLs by Intracatheter route as needed (flush).   sorbitol 70 % Soln Place 30 mLs into feeding tube daily as needed for moderate constipation.   thiamine 100 MG tablet Place 1 tablet (100 mg total) into feeding tube daily. Start taking on: October 21, 2020   valproic acid 250 MG/5ML solution Commonly known as: DEPAKENE Place 5 mLs (250 mg total) into feeding tube 2 (two) times daily.     The patient has been discharged on:   1.Beta Blocker:  Yes [   ]                              No   [ x  ]                              If No, reason:Cardiogenic shock and still recovering  2.Ace Inhibitor/ARB: Yes [   ]                                     No  [  x  ]                                     If No,  reason:AKI  3.Statin:   Yes [ x  ]                  No  [   ]                  If No, reason:  4.Ecasa:  Yes  [  x ]                  No   [   ]                  If No, reason:  Follow Up Appointments:  Follow-up Information    Call Adrian Prows, MD.   Specialty: Cardiology Why: To be seen 10 days after discharge Contact information: [place] [address] [phone]        Lajuana Matte, MD Follow up.   Specialty: Cardiothoracic Surgery Contact information: [place] [address] [facility] [place].   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

## 2020-10-04 NOTE — Progress Notes (Signed)
Patient placed back on PS 20, CPAP 5 due to continuous peak airway pressures at 45.  Patient has increased WOB that does not change regardless of the vent mode.  RT attempted PC, PRVC, and PS/CPAP.  Patient obtaining the best VT and low PIP on PS/CPAP.  RN at bedside.  RT will continue to monitor.

## 2020-10-04 NOTE — Procedures (Signed)
Bronchoscopy Procedure Note  Jack Vance  [id number]  1940/06/21  Date:10/04/20  Time:1:49 PM   Provider Performing:Laurencia Roma P Chestine Spore   Procedure(s):  Initial Therapeutic Aspiration of Tracheobronchial Tree [phone])  Indication(s) Mucus plugging  Consent Risks of the procedure as well as the alternatives and risks of each were explained to the patient and/or caregiver.  Consent for the procedure was obtained and is signed in the bedside chart. Sister provided phone consent.  Anesthesia Per MAR   Time Out Verified patient identification, verified procedure, site/side was marked, verified correct patient position, special equipment/implants available, medications/allergies/relevant history reviewed, required imaging and test results available.   Sterile Technique Usual hand hygiene, masks, gowns, and gloves were used   Procedure Description Bronchoscope advanced through endotracheal tube and into airway.  Airways were examined down to subsegmental level with findings noted below.   Following diagnostic evaluation, Therapeutic aspiration performed in ETT after failed attempts to clear secretions that were partially occluding ETT.  Findings: trachea, bronchi, segmental bronchi without secretions. No mucus plugs with lavage of RLL bronchus. ETT with secretions throughout second half of the tube, occluding >50 of the lumen. Despite multiple suction attempts with saline lavage, the secretions remained in place. Decision was made to change tube to 8.0 over exchange catheter. Bronchoscopy removed to allow patient a few minutes of ventilation and facilitate NMB administration before attempting ETT exchange. Following the procedure the peak pressures were 29 with plateau 21. Pre-procedure peaks were in the upper 40s up to 60s with Pplat in the 30s.   Complications/Tolerance mild bleeding from ETT following tube exchange. Chest X-ray is needed post  procedure.   EBL <10cc   Specimen(s) None  Steffanie Dunn, DO 10/04/20 1:53 PM [facility]

## 2020-10-04 NOTE — Progress Notes (Signed)
Patient placed back on full vent support due to increased WOB and RR.

## 2020-10-04 NOTE — Progress Notes (Signed)
PHARMACY - TOTAL PARENTERAL NUTRITION CONSULT NOTE   Indication: Prolonged ileus  Patient Measurements: Height: 5\' 6"  (167.6 cm) Weight: (P) 77.1 kg (169 lb 15.6 oz) IBW/kg (Calculated) : 63.8 TPN AdjBW (KG): 68.6 Body mass index is 27.43 kg/m (pended).  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 Patient with ileus post-op, prolonged NPO status. Cortrak placed 5/11. Trickle tube were attempted x 2 (5/7-5/8, 5/9-5/11) and held both times with vomiting. Pharmacy consulted to start TPN.  Patient currently intubated, sedated in the ICU on fentanyl/Precedex/Versed, continuing on support with milrinone and norepinephrine. Also on amiodarone drip per TCTS.  Glucose / Insulin: No previous hx DM. A1c 6.3% on 09/25/20. CBGs 147-188. Insulin drip off, utilized 16 units SSI + Lantus 8 units bid in last 24hrs Electrolytes: Na incr 152, Cl high 121, K 4.7 (received 20 meq yesterday and no Lasix; goal >/=4 with ileus), Phos 1.5, CoCa down 9.8 (decr in TPN 5/14); others WNL Renal: AKI - SCr up 2.7 (SCr 1 on 5/6), BUN up to 115. Lasix drip stopped 5/14, net -4.7L Hepatic: mild AST increase / Tbili WNL, TG down 162. Albumin 1.9, prealbumin up 14.7 Intake / Output; MIVF: UOP down 1.3 ml/kg/hr, NGT output up 600 ml/24hrs (less bilious per note), +BM s/p SMOG enema 5/12, now on daily bisacodyl supp  GI Imaging: none since TPN start GI Surgeries / Procedures: none since TPN start  Central access: CVC triple lumen placed 09/24/20 (removed 5/13); triple lumen PICC placed 09/30/20 TPN start date: 09/29/20  Nutritional Goals (per RD recommendation on 09/29/20): kCal: 11/29/20, Protein: 135-180g, Fluid: >/=2 L/day  Goal TPN rate is 95 mL/hr (provides 137 g of protein and 2178 kcals per day) Tube feed regimen at goal provides 2240 kcal and 157 g protein  Current Nutrition:  NPO and TPN  Pivot 1.5 at 20 ml/hr - plan for  slow increase to goal Prosource TF 45 ml BID (each provides 40 kcal and 11 g protein)  Plan:  Unable to concentrate TPN due to national shortage of Clinisol.  Continue TPN at goal rate of 95 mL/hr at 1800.  TPN will provide 137g protein and 2178 kCal, meeting 100% of patient needs Electrolytes in TPN: continue standard lytes except reduced Na/Phos/Ca - decr Na 76mEq/L, decrease K 67mEq/L, removed Ca 5/15, Mg 23mEq/L, and reduced Phos to 5 mmol/L. Change Cl:Ac max Ac Add standard MVI and trace elements + thiamine and folic acid per dietitian recommendation to TPN Continue TCTS q4h SSI for now and adjust as needed Lantus 8 units SQ bid - adjustment per MD Monitor TPN labs, for possible refeeding F/u plans for extubation, toleration of TF/diet and ability to wean TPN  Thank you for involving pharmacy in this patient's care.  4m, PharmD, BCPS Clinical Pharmacist Clinical phone for 10/04/2020 until 3p is 10/06/2020 10/04/2020 7:04 AM  **Pharmacist phone directory can be found on [email or web address] listed under [facility]**

## 2020-10-04 NOTE — Progress Notes (Signed)
[address]       [place] [address]             [phone]                 11 Days Post-Op Procedure(s) (LRB): CORONARY ARTERY BYPASS GRAFTING (CABG)  TIMES 4 USING LEFT GREATER SAPHENOUS VEIN HARVESTED ENDOSCOPICALLY AND LEFT INTERNAL MAMMARY ARTERY (N/A) TRANSESOPHAGEAL ECHOCARDIOGRAM (TEE) (N/A)   Events: No events Tolerating SBT  _______________________________________________________________ Vitals: BP 124/82   Pulse (!) 106   Temp (!) 100.6 F (38.1 C) (Oral)   Resp (!) 30   Ht 5\' 6"  (1.676 m)   Wt (P) 77.1 kg   SpO2 99%   BMI (P) 27.43 kg/m   - Neuro: sedated  - Cardiovascular: Normal sinus rhythm.  I  Drips:  milrinone at 0.25 CVP:  [2 mmHg-11 mmHg] 11 mmHg  - Pulm:  Vent Mode: PSV;CPAP FiO2 (%):  [40 %] 40 % Set Rate:  [22 bmp] 22 bmp Vt Set:  [560 mL] 560 mL PEEP:  [5 cmH20] 5 cmH20 Pressure Support:  [20 cmH20] 20 cmH20 Plateau Pressure:  [20 cmH20-34 cmH20] 21 cmH20  ABG    Component Value Date/Time   PHART 7.410 09/26/2020 0539   PCO2ART 31.1 (L) 09/26/2020 0539   PO2ART 136 (H) 09/26/2020 0539   HCO3 19.9 (L) 09/26/2020 0539   TCO2 21 (L) 09/26/2020 0539   ACIDBASEDEF 4.0 (H) 09/26/2020 0539   O2SAT 74.5 10/04/2020 0444    - Abd: distended this am - Extremity: Warm  .Intake/Output      05/16 0701 05/17 0700 05/17 0701 05/18 0700   I.V. (mL/kg) 4455.7 (57.8)    NG/GT 520    IV Piggyback 45.9    Total Intake(mL/kg) 5021.5 (65.1)    Urine (mL/kg/hr) 2250 (1.2)    Emesis/NG output 600    Total Output 2850    Net +2171.5            _______________________________________________________________ Labs: CBC Latest Ref Rng & Units 10/04/2020 10/03/2020 10/02/2020  WBC 4.0 - 10.5 K/uL 9.0 12.1(H) 12.9(H)  Hemoglobin 13.0 - 17.0 g/dL 7.9(L) 8.2(L) 8.9(L)  Hematocrit 39.0 - 52.0 % 26.9(L) 26.9(L) 28.9(L)  Platelets 150 - 400 K/uL 196 181 140(L)   CMP Latest Ref Rng & Units 10/03/2020 10/02/2020 10/01/2020   Glucose 70 - 99 mg/dL 10/03/2020) 086(V) 784(O)  BUN 8 - 23 mg/dL 962(X) 528(U) 13(K)  Creatinine 0.61 - 1.24 mg/dL 44(W) 1.02(V) 2.53(G)  Sodium 135 - 145 mmol/L 147(H) 145 143  Potassium 3.5 - 5.1 mmol/L 3.7 3.6 3.4(L)  Chloride 98 - 111 mmol/L 112(H) 105 100  CO2 22 - 32 mmol/L 29 30 34(H)  Calcium 8.9 - 10.3 mg/dL 6.44(I) 3.4(V) 4.2(V)  Total Protein 6.5 - 8.1 g/dL 9.5(G) - -  Total Bilirubin 0.3 - 1.2 mg/dL 1.0 - -  Alkaline Phos 38 - 126 U/L 72 - -  AST 15 - 41 U/L 42(H) - -  ALT 0 - 44 U/L 17 - -    CXR: pending   _______________________________________________________________  Assessment and Plan: POD 11 s/p emergency CABG.  Neuro: Wean sedation as tolerated. CV:  milr to 0.125 Pulm: Wean vent as tolerated.  Good respiratory mechanics.  Will continue vent sprints Renal: good uop GI: On TPN and trickle tube feeds.  NG output less bilious.  Will hold tube feeds at current rate until return of bowel function Heme: Stable 3.8(V, off zosyn Endo:  Sliding scale insulin. Dispo: Continue ICU care.   Corliss Skains 10/04/2020 8:23 AM

## 2020-10-04 NOTE — Progress Notes (Deleted)
eLink Physician-Brief Progress Note Patient Name: Jack Vance DOB: August 17, 1940 MRN: [id number]   Date of Service  10/04/2020  HPI/Events of Note  Blood sugar is 70 despite a D 5 % bicarb infusion going at 100 ml / hour.  eICU Interventions  D 50 % Water 12.5 gm iv bolus x 1, continue D 5 %  Bicarb infusion, CBG hourly  X 6 hours.        Thomasene Lot Jack Vance 10/04/2020, 4:55 AM

## 2020-10-04 NOTE — Anesthesia Postprocedure Evaluation (Signed)
Anesthesia Post Note  Patient: Jack Vance  Procedure(s) Performed: CORONARY ARTERY BYPASS GRAFTING (CABG)  TIMES 4 USING LEFT GREATER SAPHENOUS VEIN HARVESTED ENDOSCOPICALLY AND LEFT INTERNAL MAMMARY ARTERY (N/A Chest) TRANSESOPHAGEAL ECHOCARDIOGRAM (TEE) (N/A )     Patient location during evaluation: SICU Anesthesia Type: General Level of consciousness: patient remains intubated per anesthesia plan Pain management: pain level controlled Vital Signs Assessment: post-procedure vital signs reviewed and stable Respiratory status: patient remains intubated per anesthesia plan Cardiovascular status: stable Postop Assessment: no apparent nausea or vomiting Anesthetic complications: no   No complications documented.  Last Vitals:  Vitals:   10/04/20 1941 10/04/20 2000  BP:  128/65  Pulse: 99   Resp: (!) 42 (!) 29  Temp:    SpO2: 99% 99%    Last Pain:  Vitals:   10/04/20 1628  TempSrc: Oral  PainSc:                  Kamali Sakata

## 2020-10-04 NOTE — Progress Notes (Signed)
Patient ID: Jack Vance, male   DOB: [birth year], 80 y.o.   MRN: [id number]  TCTS Evening Rounds:   Hemodynamically stable  Milrinone 0.125  Remains on vent. Had bronch today and ETT changed out due to partial occlusion with secretions.   Urine output good    CBC    Component Value Date/Time   WBC 9.0 10/04/2020 0444   RBC 2.68 (L) 10/04/2020 0444   HGB 7.9 (L) 10/04/2020 0444   HCT 26.9 (L) 10/04/2020 0444   PLT 196 10/04/2020 0444   MCV 100.4 (H) 10/04/2020 0444   MCH 29.5 10/04/2020 0444   MCHC 29.4 (L) 10/04/2020 0444   RDW 20.6 (H) 10/04/2020 0444   LYMPHSABS 1.2 10/03/2020 0424   MONOABS 1.1 (H) 10/03/2020 0424   EOSABS 0.2 10/03/2020 0424   BASOSABS 0.1 10/03/2020 0424     BMET    Component Value Date/Time   NA 151 (H) 10/04/2020 1356   K 4.9 10/04/2020 1356   CL 120 (H) 10/04/2020 1356   CO2 23 10/04/2020 1356   GLUCOSE 212 (H) 10/04/2020 1356   BUN 123 (H) 10/04/2020 1356   CREATININE 2.71 (H) 10/04/2020 1356   CALCIUM 7.8 (L) 10/04/2020 1356   GFRNONAA 23 (L) 10/04/2020 1356   GFRAA >60 12/08/2017 0510     A/P: Continue current plans per CCM.

## 2020-10-04 NOTE — Discharge Instructions (Signed)
Discharge Instructions:  1. You may shower, please wash incisions daily with soap and water and keep dry.  If you wish to cover wounds with dressing you may do so but please keep clean and change daily.  No tub baths or swimming until incisions have completely healed.  If your incisions become red or develop any drainage please call our office at [phone]  2. No Driving until cleared by Dr. Lightfoot's office and you are no longer using narcotic pain medications  3. Monitor your weight daily.. Please use the same scale and weigh at same time... If you gain 5-10 lbs in 48 hours with associated lower extremity swelling, please contact our office at [phone]  4. Fever of 101.5 for at least 24 hours with no source, please contact our office at [phone]   Prediabetes Eating Plan Prediabetes is a condition that causes blood sugar (glucose) levels to be higher than normal. This increases the risk for developing type 2 diabetes (type 2 diabetes mellitus). Working with a health care provider or nutrition specialist (dietitian) to make diet and lifestyle changes can help prevent the onset of diabetes. These changes may help you:  Control your blood glucose levels.  Improve your cholesterol levels.  Manage your blood pressure. What are tips for following this plan? Reading food labels  Read food labels to check the amount of fat, salt (sodium), and sugar in prepackaged foods. Avoid foods that have: ? Saturated fats. ? Trans fats. ? Added sugars.  Avoid foods that have more than 300 milligrams (mg) of sodium per serving. Limit your sodium intake to less than 2,300 mg each day. Shopping  Avoid buying pre-made and processed foods.  Avoid buying drinks with added sugar. Cooking  Cook with olive oil. Do not use butter, lard, or ghee.  Bake, broil, grill, steam, or boil foods. Avoid frying. Meal planning  Work with your dietitian to create an eating plan that is right for you. This  may include tracking how many calories you take in each day. Use a food diary, notebook, or mobile application to track what you eat at each meal.  Consider following a Mediterranean diet. This includes: ? Eating several servings of fresh fruits and vegetables each day. ? Eating fish at least twice a week. ? Eating one serving each day of whole grains, beans, nuts, and seeds. ? Using olive oil instead of other fats. ? Limiting alcohol. ? Limiting red meat. ? Using nonfat or low-fat dairy products.  Consider following a plant-based diet. This includes dietary choices that focus on eating mostly vegetables and fruit, grains, beans, nuts, and seeds.  If you have high blood pressure, you may need to limit your sodium intake or follow a diet such as the DASH (Dietary Approaches to Stop Hypertension) eating plan. The DASH diet aims to lower high blood pressure.   Lifestyle  Set weight loss goals with help from your health care team. It is recommended that most people with prediabetes lose 7% of their body weight.  Exercise for at least 30 minutes 5 or more days a week.  Attend a support group or seek support from a mental health counselor.  Take over-the-counter and prescription medicines only as told by your health care provider. What foods are recommended? Fruits Berries. Bananas. Apples. Oranges. Grapes. Papaya. Mango. Pomegranate. Kiwi. Grapefruit. Cherries. Vegetables Lettuce. Spinach. Peas. Beets. Cauliflower. Cabbage. Broccoli. Carrots. Tomatoes. Squash. Eggplant. Herbs. Peppers. Onions. Cucumbers. Brussels sprouts. Grains Whole grains, such as whole-wheat or whole-grain breads,   crackers, cereals, and pasta. Unsweetened oatmeal. Bulgur. Barley. Quinoa. Brown rice. Corn or whole-wheat flour tortillas or taco shells. Meats and other proteins Seafood. Poultry without skin. Lean cuts of pork and beef. Tofu. Eggs. Nuts. Beans. Dairy Low-fat or fat-free dairy products, such as yogurt,  cottage cheese, and cheese. Beverages Water. Tea. Coffee. Sugar-free or diet soda. Seltzer water. Low-fat or nonfat milk. Milk alternatives, such as soy or almond milk. Fats and oils Olive oil. Canola oil. Sunflower oil. Grapeseed oil. Avocado. Walnuts. Sweets and desserts Sugar-free or low-fat pudding. Sugar-free or low-fat ice cream and other frozen treats. Seasonings and condiments Herbs. Sodium-free spices. Mustard. Relish. Low-salt, low-sugar ketchup. Low-salt, low-sugar barbecue sauce. Low-fat or fat-free mayonnaise. The items listed above may not be a complete list of recommended foods and beverages. Contact a dietitian for more information. What foods are not recommended? Fruits Fruits canned with syrup. Vegetables Canned vegetables. Frozen vegetables with butter or cream sauce. Grains Refined white flour and flour products, such as bread, pasta, snack foods, and cereals. Meats and other proteins Fatty cuts of meat. Poultry with skin. Breaded or fried meat. Processed meats. Dairy Full-fat yogurt, cheese, or milk. Beverages Sweetened drinks, such as iced tea and soda. Fats and oils Butter. Lard. Ghee. Sweets and desserts Baked goods, such as cake, cupcakes, pastries, cookies, and cheesecake. Seasonings and condiments Spice mixes with added salt. Ketchup. Barbecue sauce. Mayonnaise. The items listed above may not be a complete list of foods and beverages that are not recommended. Contact a dietitian for more information. Where to find more information  American Diabetes Association: www.diabetes.org Summary  You may need to make diet and lifestyle changes to help prevent the onset of diabetes. These changes can help you control blood sugar, improve cholesterol levels, and manage blood pressure.  Set weight loss goals with help from your health care team. It is recommended that most people with prediabetes lose 7% of their body weight.  Consider following a Mediterranean  diet. This includes eating plenty of fresh fruits and vegetables, whole grains, beans, nuts, seeds, fish, and low-fat dairy, and using olive oil instead of other fats. This information is not intended to replace advice given to you by your health care provider. Make sure you discuss any questions you have with your health care provider. Document Revised: 08/06/2019 Document Reviewed: 08/06/2019 Elsevier Patient Education  2021 [employer].   5. Activity- up as tolerated, please walk at least 3 times per day.  Avoid strenuous activity, no lifting, pushing, or pulling with your arms over 8-10 lbs for a minimum of 6 weeks  6. If any questions or concerns arise, please do not hesitate to contact our office at [phone] 

## 2020-10-04 NOTE — Progress Notes (Addendum)
Advanced Heart Failure Rounding Note   Subjective:    Underwent emergent CABG on 5/6 for critical LM disease (LIMA -> LAD, SVG -> OM, SVG -> RCA) 5/6 Cultures negative.  5/8 Possible aspiration. A fib --> started amio drip.  Given 2u PRBCs. Hypotensive in the evening and started on vasopressin + antibiotics.  5/9 IABP removed 5/10 NG placed >1.5 out  5/11 started neostigmime 5/12 Febrile 102   Remains on vent. FiO2 40%. Failed SBT yesterday.   Remains on milrinone 0.25. Norepi off. Co-ox 75%   Diuretics held yesterday w/ low CVP. CVP remains low 2-3 today. Wt down 5 lb.   Zosyn stopped yesterday. Febrile overnight. mTemp 100.6. WBC 12>>9K.   Objective:   Weight Range:  Vital Signs:   Temp:  [100.1 F (37.8 C)-103.3 F (39.6 C)] 100.6 F (38.1 C) (05/17 0334) Pulse Rate:  [97-119] 106 (05/17 0733) Resp:  [21-39] 30 (05/17 0733) BP: (97-146)/(46-82) 124/82 (05/17 0733) SpO2:  [97 %-99 %] 99 % (05/17 0733) FiO2 (%):  [40 %] 40 % (05/17 0733) Weight:  [77.1 kg] (P) 77.1 kg (05/17 0523) Last BM Date: 09/29/20  Weight change: Filed Weights   10/02/20 0500 10/03/20 0515 10/04/20 0523  Weight: 78.8 kg 77.6 kg (P) 77.1 kg    Intake/Output:   Intake/Output Summary (Last 24 hours) at 10/04/2020 0804 Last data filed at 10/04/2020 0700 Gross per 24 hour  Intake 4787.72 ml  Output 2640 ml  Net 2147.72 ml     PHYSICAL EXAM: CVP 2-3  General:  intubated. Awake on vent but not following commands  HEENT: + ETT, + cortrak  Neck: supple. no JVD. Carotids 2+ bilat; no bruits. No lymphadenopathy or thyromegaly appreciated. Cor: PMI nondisplaced. Regular rate & rhythm. No rubs, gallops or murmurs. Lungs: intubated and clear  Abdomen: soft, nontender, nondistended. No hepatosplenomegaly. No bruits + large ventral hernia easily reducible Good bowel sounds. Extremities: no cyanosis, clubbing, rash, edema Neuro: intubated, awake on vent. Not following commands.       Telemetry: SR w/ PACs 90s Personally reviewed  Labs: Basic Metabolic Panel: Recent Labs  Lab 09/28/20 0542 09/28/20 1656 09/29/20 0602 09/30/20 2641 09/30/20 1556 10/01/20 0015 10/01/20 0319 10/01/20 1307 10/02/20 0355 10/03/20 0424  NA 138   < > 140 144 142  --  142 143 145 147*  K 3.1*   < > 3.2* 2.4* 3.0* 3.2* 3.0* 3.4* 3.6 3.7  CL 101   < > 99 102 100  --  98 100 105 112*  CO2 23   < > _0 --  31 34* 30 29  GLUCOSE 96   < > 143* 195* 162*  --  182* 174* 181* 160*  BUN 74*   < > 77* 80* 80*  --  84* 85* 88* 100*  CREATININE 3.33*   < > 3.46* 2.92* 2.76*  --  2.71* 2.58* 2.37* 2.48*  CALCIUM 7.8*   < > 7.9* 8.4* 8.2*  --  8.3* 8.5* 8.7* 8.5*  MG 2.5*  --  2.2 2.4  --   --   --  2.1  --  2.2  PHOS  --   --  6.7* 4.4  --   --   --   --   --  1.5*   < > = values in this interval not displayed.    Liver Function Tests: Recent Labs  Lab 09/29/20 0602 09/30/20 0337 10/03/20 0424  AST 29 29 42*  ALT _0 ALKPHOS 114 117 72  BILITOT 1.1 0.8 1.0  PROT 5.1* 5.3* 5.4*  ALBUMIN 1.6* 1.6* 1.7*   No results for input(s): LIPASE, AMYLASE in the last 168 hours. No results for input(s): AMMONIA in the last 168 hours.  CBC: Recent Labs  Lab 09/30/20 0337 10/01/20 0319 10/02/20 0355 10/03/20 0424 10/04/20 0444  WBC 11.0* 11.9* 12.9* 12.1* 9.0  NEUTROABS 9.8*  --   --  9.5*  --   HGB 9.2* 8.9* 8.9* 8.2* 7.9*  HCT 28.2* 28.0* 28.9* 26.9* 26.9*  MCV 81.5 83.3 85.5 87.1 100.4*  PLT 90* 109* 140* 181 196    Cardiac Enzymes: No results for input(s): CKTOTAL, CKMB, CKMBINDEX, TROPONINI in the last 168 hours.  BNP: BNP (last 3 results) Recent Labs    09/23/20 0311  BNP 1,191.8*    ProBNP (last 3 results) No results for input(s): PROBNP in the last 8760 hours.    Other results:  Imaging: No results found.   Medications:     Scheduled Medications: . amiodarone  200 mg Per Tube BID  . aspirin  324 mg Per Tube Daily  . bisacodyl  10 mg Rectal  Daily  . chlorhexidine gluconate (MEDLINE KIT)  15 mL Mouth Rinse BID  . Chlorhexidine Gluconate Cloth  6 each Topical Daily  . feeding supplement (PIVOT 1.5 CAL)  1,000 mL Per Tube Q24H  . feeding supplement (PROSource TF)  45 mL Per Tube BID  . free water  30 mL Per Tube Q4H  . heparin injection (subcutaneous)  5,000 Units Subcutaneous Q8H  . hydrALAZINE  25 mg Per Tube Q8H  . insulin aspart  0-24 Units Subcutaneous Q4H  . insulin glargine  8 Units Subcutaneous BID  . ipratropium-albuterol  3 mL Nebulization Q6H  . isosorbide dinitrate  10 mg Per Tube TID  . mouth rinse  15 mL Mouth Rinse 10 times per day  . metoCLOPramide (REGLAN) injection  5 mg Intravenous Q6H  . neostigmine  0.25 mg Subcutaneous Q6H  . pantoprazole (PROTONIX) IV  40 mg Intravenous Q24H  . sodium chloride flush  10-40 mL Intracatheter Q12H  . sodium chloride flush  3 mL Intravenous Q12H    Infusions: . dexmedetomidine (PRECEDEX) IV infusion Stopped (10/04/20 0932)  . dextrose 50 mL/hr at 10/04/20 0700  . fentaNYL infusion INTRAVENOUS Stopped (10/01/20 0024)  . lactated ringers 10 mL/hr at 10/01/20 1900  . lactated ringers Stopped (10/01/20 3557)  . milrinone 0.25 mcg/kg/min (10/04/20 0700)  . norepinephrine (LEVOPHED) Adult infusion Stopped (09/30/20 0826)  . TPN ADULT (ION) 95 mL/hr at 10/04/20 0700    PRN Medications: acetaminophen (TYLENOL) oral liquid 160 mg/5 mL, albuterol, fentaNYL (SUBLIMAZE) injection, lip balm, metoprolol tartrate, midazolam, ondansetron (ZOFRAN) IV, sodium chloride flush, sodium chloride flush   Assessment/Plan:   1. 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  - Co-ox 75% on milrinone 0.25. Wean to 0.125 today. Off NE - Weight well below baseline. CVP 2-3. Continue diuretic hold  - No ACE/ARB/ARNI/MRA with AKI. No  b-blocker yet with shock.  - c/w hydral/nitrates for afterload reduction   2. NSTEMI with critical CAD - 95% LM and high grade ostial RCA - CABG on 5/6 for critical LM disease (LIMA -> LAD, SVG -> OM, SVG -> RCA) - Continue ASA/statin  -  Add Plavix soon   3. Acute hypoxic respiratory failure due to pulmonary edema - on vent - Possible aspiration 5/8 & 5/10  - 5/6 Blood CX - NG  - Antibiotics completed 5/16 - CCM following - SBT agan today. Mental status remains major issue. May need trach to enable wean    4. AKI  - due to ATN - Cr 1.0 -> 1.7 -> 1.95-->2.3 ->3->3.3->3.5->2.9-> 2.8 -> 2.7 -> 2.37->2.48->pending - Support hemodynamics.  - continue to follow and watch BUN   5. Ventral Hernia  - 5/10 NG placed to decompress   6. ID - Finished zosyn for possible aspiration.  - Blood CX 5/6- NGTD - TMax 100.6. Follow fever curve. May need repeat cultures if fevers persists    Length of Stay: [address] PA-C 10/04/2020, 8:04 AM  Advanced Heart Failure Team Pager [phone] (M-F; 7a - 4p)  Please contact [facility] for night-coverage after hours (4p -7a ) and weekends on [email or web address]  Agree with above.   On vent. Struggling with wean. Becomes quite tachypneic. On milrinone. Co-ox ok. Still with low grade fevers. No BM   General:  tachypneic on vent  Will open eyes but not follow commands HEENT: normal + ETT and cor-trak Neck: supple. no JVD. Carotids 2+ bilat; no bruits. No lymphadenopathy or thryomegaly appreciated. Cor: PMI nondisplaced. Regular tachy No rubs, gallops or murmurs. Lungs: coarse. tachypneic Abdomen: soft, nontender, nondistended. No hepatosplenomegaly. No bruits or masses. Large ventral hernia  Extremities: no cyanosis, clubbing, rash, edema Neuro: as above  Remains very tenuous. Struggling with vent wean. Ideally want to avoid trach with fresh sternotomy. Continue weaning attempts. Will increase bowel regimen. Continue milrinone. Will  need free water. Hold diuretics. If fever curve spikes low threshold to add back abx coverage.   CRITICAL CARE Performed by: Glori Bickers  Total critical care time: 35 minutes  Critical care time was exclusive of separately billable procedures and treating other patients.  Critical care was necessary to treat or prevent imminent or life-threatening deterioration.  Critical care was time spent personally by me (independent of midlevel providers or residents) on the following activities: development of treatment plan with patient and/or surrogate as well as nursing, discussions with consultants, evaluation of patient's response to treatment, examination of patient, obtaining history from patient or surrogate, ordering and performing treatments and interventions, ordering and review of laboratory studies, ordering and review of radiographic studies, pulse oximetry and re-evaluation of patient's condition.  Glori Bickers, MD  9:31 AM

## 2020-10-04 NOTE — Progress Notes (Signed)
NAME:  Jack Vance, MRN:  [id number], DOB:  1940-08-27, LOS: 11 ADMISSION DATE:  09/23/2020, CONSULTATION DATE:  09/24/2019 REFERRING MD:  Jacinto Halim, CHIEF COMPLAINT:  Acute coronary syndrome   History of Present Illness:  80 year old man who presented with cardiogenic shock secondary to NSTEMI.  Presented with new onset RSCP and severe dyspnea. Found to have acute pulmonary edema and heart failure; intubated and sent for urgent LHC which revealed 3V CAD.  IABP inserted and patient underwent urgent CABG. IABP removed 5/9.  Pertinent  Medical History   Past Medical History:  Diagnosis Date  . Arthritis   . Asthma   . DDD (degenerative disc disease), lumbar   . Hernia of abdominal wall   . RLS (restless legs syndrome)    Significant Hospital Events: Including procedures, antibiotic start and stop dates in addition to other pertinent events   . 5/6 CABG with Dr Cliffton Asters with LIMA to LAD, SVG to RCA d and SVG to ramus. LV normal and returned to ICU with no vasopressor support.  . No post-operative bleeding but poor RV performance on hemodynamics.  . 5/8 significant hemodynamic instability with labile blood pressure and vasopressor requirements yesterday.  Episode of emesis but no evidence of bowel obstruction.  Evidence of pericardial tamponade. . 5/9 improved hemodynamic, able now to tolerate balloon pump weaning and removal. . 5/11 Stable hemodynamics, continuing diuresis. Improved UOP. TPN consult for initiation 5/12 in the setting of ileus, prolonged NPO status.  . 5/13 sedated, diuresis, milrinone, PICC line today, CVL removed  . 5/14 tolerated short SBT/SAT on Precedex . 5/16 Not tolerating SBT due to significant tachypnea to 50s, placed back on PRVC. Marland Kitchen 5/17 Improved SBT toleration, weaning milrinone, minimizing sedation  Interim History / Subjective:  No significant events overnight Tolerating SBT with support (PS 20/5, FiO2 40%) Remains tachypneic to 30s Following commands  intermittently on minimal sedation  Objective   Blood pressure (!) 128/59, pulse (!) 106, temperature 98.9 F (37.2 C), temperature source Oral, resp. rate (!) 30, height 5\' 6"  (1.676 m), weight 75.7 kg, SpO2 100 %. CVP:  [2 mmHg-11 mmHg] 3 mmHg  Vent Mode: PSV;CPAP FiO2 (%):  [40 %] 40 % Set Rate:  [22 bmp] 22 bmp Vt Set:  [560 mL] 560 mL PEEP:  [5 cmH20] 5 cmH20 Pressure Support:  [20 cmH20] 20 cmH20 Plateau Pressure:  [20 cmH20-34 cmH20] 21 cmH20   Intake/Output Summary (Last 24 hours) at 10/04/2020 1116 Last data filed at 10/04/2020 0900 Gross per 24 hour  Intake 4975.39 ml  Output 2840 ml  Net 2135.39 ml   Filed Weights   10/03/20 0515 10/04/20 0523 10/04/20 0800  Weight: 77.6 kg (P) 77.1 kg 75.7 kg   Physical Examination: General: Chronically ill-appearing male in NAD. Appears uncomfortable. HEENT: [place]/AT, anicteric sclera, PERRL, moist mucous membranes. ETT/OGT/Cortrak in place. Neuro: Lethargic. Responds to verbal stimuli. Following commands intermittently. Moves all 4 extremities spontaneously. CV: Tachycardic to 110s. PULM: Tachypneic to mid 30s with mildly labored breathing, abdominal accessory muscle use on vent (PS 20/5, FiO2 40%). Lung fields coarse. GI: Soft, nontender, mild-moderately distended. Normoactive bowel sounds. Large ventral hernia soft/compressible. Extremities: Trace BLE edema noted. Skin: Warm/dry, no rashes.  Labs/imaging that I have personally reviewed: (right click and "Reselect all SmartList Selections" daily)  WBC 9.0 (12.1), H&H 7.9/26.9 (8.2/26.9), Plt 196 (151)  Na 152 (147), K 4.7 (3.7), CO2 25 (29), BUN 115 (100), Cr 2.7 (2.48), peak 2.9 this admission  Glucoses 124-158 last 24H  Co-ox 74.5 (67.1) %  Prealbumin 14.7  TG 162 (216)  Resolved Hospital Problem List   NSTEMI, Thrombocytopenia  Assessment & Plan:   Acute hypoxemic hypercapnic respiratory failure requiring intubation mechanical ventilation Possible aspiration -  Continue full vent support (4-8cc/kg IBW) - Wean FiO2 for O2 sat > 90% - Daily WUA/SBT, better-tolerating multiple short SBTs (given tachypnea) - VAP bundle - Pulmonary hygiene - PAD protocol for sedation: Precedex for goal RASS 0 to -1, minimizing as able  - S/p Zosyn course - Extubation continues to be precluded by weakness evidenced by increased WOB/tachypnea and mental status with continued need for sedation for vent tolerance - Will need to discuss trach in the upcoming days/weeks if unable to tolerate weaning  Critically ill due to RV dysfunction following ACS and cardiopulmonary bypass  Cardiogenic shock NSTEMI with culprit RCA involvement Coronary artery disease - Appreciate management of TCTS/HF teams - Diuresis per primary team - Remains on amiodarone - Weaning milrinone to 0.125 today  Acute kidney injury Likely multifactorial; favor primarily prerenal in the setting of hemodynamic insults, cardiogenic shock, aggressive diuresis for volume management, medications. Baseline Cr ~1.0.  - Trend BMP - Replete electrolytes as indicated - Monitor I&Os - F/u urine studies - Avoid nephrotoxic agents as able - Ensure adequate renal perfusion  Gastric ileus, improved Increasing abdominal distention and hypoactive bowel sounds. Tolerating trickle TFs. - Increased abdominal distention 5/17, though no e/o feeds in OGT output - Keep TF rate at 48mL/hr via Cortrak - Continue OGT - Continue TPN - Bowel regimen as appropriate  Agitated delirium controlled Alcohol abuse - Delirium Precautions  Best Practice: (right click and "Reselect all SmartList Selections" daily)  Diet:  Tube Feed  + TPN Pain/Anxiety/Delirium protocol (if indicated): Yes (RASS goal 0) to -1 VAP protocol (if indicated): Yes DVT prophylaxis: Subcutaneous Heparin  GI prophylaxis: PPI Glucose control:  SSI Yes Central venous access:  Yes, and it is still needed Arterial line:  Yes, and it is still  needed Foley:  Yes, and it is still needed Mobility:  bed rest  PT consulted: N/A Last date of multidisciplinary goals of care discussion [Per Primary] Code Status:  full code Disposition: ICU  Critical care time: 39 minutes   Tim Lair, PA-C [facility] 10/04/20 11:16 AM  Please see [email or web address] for pager details.  From 7A-7P if no response, please call [phone] After hours, please call E-Link [phone]

## 2020-10-05 DIAGNOSIS — R570 Cardiogenic shock: Secondary | ICD-10-CM | POA: Diagnosis not present

## 2020-10-05 DIAGNOSIS — Z9911 Dependence on respirator [ventilator] status: Secondary | ICD-10-CM | POA: Diagnosis not present

## 2020-10-05 DIAGNOSIS — L8990 Pressure ulcer of unspecified site, unspecified stage: Secondary | ICD-10-CM | POA: Insufficient documentation

## 2020-10-05 DIAGNOSIS — K567 Ileus, unspecified: Secondary | ICD-10-CM | POA: Diagnosis not present

## 2020-10-05 DIAGNOSIS — I5021 Acute systolic (congestive) heart failure: Secondary | ICD-10-CM | POA: Diagnosis not present

## 2020-10-05 DIAGNOSIS — G9340 Encephalopathy, unspecified: Secondary | ICD-10-CM | POA: Diagnosis not present

## 2020-10-05 LAB — COOXEMETRY PANEL
Carboxyhemoglobin: 0.7 % (ref 0.5–1.5)
Methemoglobin: 1.6 % — ABNORMAL HIGH (ref 0.0–1.5)
O2 Saturation: 65.2 %
Total hemoglobin: 11.8 g/dL — ABNORMAL LOW (ref 12.0–16.0)

## 2020-10-05 LAB — URINALYSIS, ROUTINE W REFLEX MICROSCOPIC
Bilirubin Urine: NEGATIVE
Glucose, UA: NEGATIVE mg/dL
Ketones, ur: NEGATIVE mg/dL
Leukocytes,Ua: NEGATIVE
Nitrite: NEGATIVE
Protein, ur: NEGATIVE mg/dL
Specific Gravity, Urine: 1.014 (ref 1.005–1.030)
pH: 5 (ref 5.0–8.0)

## 2020-10-05 LAB — GLUCOSE, CAPILLARY
Glucose-Capillary: 145 mg/dL — ABNORMAL HIGH (ref 70–99)
Glucose-Capillary: 157 mg/dL — ABNORMAL HIGH (ref 70–99)
Glucose-Capillary: 159 mg/dL — ABNORMAL HIGH (ref 70–99)
Glucose-Capillary: 163 mg/dL — ABNORMAL HIGH (ref 70–99)
Glucose-Capillary: 180 mg/dL — ABNORMAL HIGH (ref 70–99)
Glucose-Capillary: 196 mg/dL — ABNORMAL HIGH (ref 70–99)

## 2020-10-05 LAB — BASIC METABOLIC PANEL
Anion gap: 7 (ref 5–15)
BUN: 121 mg/dL — ABNORMAL HIGH (ref 8–23)
CO2: 21 mmol/L — ABNORMAL LOW (ref 22–32)
Calcium: 7.5 mg/dL — ABNORMAL LOW (ref 8.9–10.3)
Chloride: 116 mmol/L — ABNORMAL HIGH (ref 98–111)
Creatinine, Ser: 2.7 mg/dL — ABNORMAL HIGH (ref 0.61–1.24)
GFR, Estimated: 23 mL/min — ABNORMAL LOW (ref >60)
Glucose, Bld: 305 mg/dL — ABNORMAL HIGH (ref 70–99)
Potassium: 4.5 mmol/L (ref 3.5–5.1)
Sodium: 144 mmol/L (ref 135–145)

## 2020-10-05 LAB — CBC
HCT: 23.4 % — ABNORMAL LOW (ref 39.0–52.0)
Hemoglobin: 7.2 g/dL — ABNORMAL LOW (ref 13.0–17.0)
MCH: 33.8 pg (ref 26.0–34.0)
MCHC: 30.8 g/dL (ref 30.0–36.0)
MCV: 109.9 fL — ABNORMAL HIGH (ref 80.0–100.0)
Platelets: 177 10*3/uL (ref 150–400)
RBC: 2.13 MIL/uL — ABNORMAL LOW (ref 4.22–5.81)
RDW: 21.2 % — ABNORMAL HIGH (ref 11.5–15.5)
WBC: 8.5 10*3/uL (ref 4.0–10.5)
nRBC: 0 % (ref 0.0–0.2)

## 2020-10-05 LAB — SODIUM, URINE, RANDOM: Sodium, Ur: <10 mmol/L

## 2020-10-05 MED ORDER — TRAVASOL 10 % IV SOLN
INTRAVENOUS | Status: AC
  Filled 2020-10-05: qty 1368

## 2020-10-05 MED ORDER — QUETIAPINE FUMARATE 25 MG PO TABS
25.0000 mg | ORAL_TABLET | Freq: Two times a day (BID) | ORAL | Status: DC
  Administered 2020-10-05 (×2): 25 mg
  Filled 2020-10-05 (×2): qty 1

## 2020-10-05 MED ORDER — INSULIN ASPART 100 UNIT/ML IJ SOLN
4.0000 [IU] | INTRAMUSCULAR | Status: DC
  Administered 2020-10-05 – 2020-10-06 (×4): 4 [IU] via SUBCUTANEOUS

## 2020-10-05 MED ORDER — FREE WATER
50.0000 mL | Status: DC
  Administered 2020-10-05 – 2020-10-06 (×5): 50 mL

## 2020-10-05 MED ORDER — CLONAZEPAM 1 MG PO TABS: 1.0000 mg | ORAL_TABLET | Freq: Two times a day (BID) | ORAL | Status: DC

## 2020-10-05 MED ORDER — CLONAZEPAM 1 MG PO TABS
1.0000 mg | ORAL_TABLET | Freq: Two times a day (BID) | ORAL | Status: DC
  Administered 2020-10-05 – 2020-10-11 (×14): 1 mg
  Filled 2020-10-05 (×14): qty 1

## 2020-10-05 MED ORDER — SODIUM CHLORIDE 0.45 % IV SOLN: INTRAVENOUS | Status: DC

## 2020-10-05 MED ORDER — ROPINIROLE HCL 1 MG PO TABS
3.0000 mg | ORAL_TABLET | Freq: Three times a day (TID) | ORAL | Status: DC
  Administered 2020-10-05 – 2020-10-11 (×21): 3 mg
  Filled 2020-10-05 (×22): qty 3

## 2020-10-05 NOTE — Progress Notes (Signed)
PHARMACY - TOTAL PARENTERAL NUTRITION CONSULT NOTE   Indication: Prolonged ileus  Patient Measurements: Height: 5\' 6"  (167.6 cm) Weight: 77.6 kg (171 lb 1.2 oz) IBW/kg (Calculated) : 63.8 TPN AdjBW (KG): 68.6 Body mass index is 27.61 kg/m.  Assessment: 80 y/o M presents with cardiogenic shock secondary to NSTEMI, s/p urgent cath revealing 3V CAD and subsequent emergent CABG. Patient with ileus post-op with prolonged NPO status. Cortrak placed 5/11. Trickle tube were attempted x 2 (5/7-5/8, 5/9-5/11) and held both times with vomiting. Pharmacy consulted to start TPN.  Patient currently intubated, sedated in the ICU on Precedex, Seroquel BID, Cardiac: continuing on support with milrinone 0.125 mcg/kg/min. Amio IV>po, hydralazine/tube, Isordil,  GI: Dulcolax supp.daily.Reglan 5mg  IV q6h, IV PPI/24h, Miralax/tube BID,   Glucose / Insulin: No hx DM. A1c 6.3% (09/25/20). CBGs 145-209. Insulin drip off, -Insulin: 32 units SSI + Lantus 8 units bid in last 24hrs on still on D5W  Electrolytes: Na 144 down, Cl 116 down, K 4.5  (goal >/=4 with ileus), Phos 1.5, CoCa down 9.18 (decr in TPN 5/14); others WNL  Renal: AKI - SCr up 2.7 remaining stable (SCr 1 on 5/6), BUN up to 121. Lasix drip stopped 5/14, net -3L  Hepatic: mild AST increase / Tbili WNL, TG down 162. Albumin 1.9, prealbumin up 14.7  Intake / Output; MIVF: UOP 1.3 ml/kg/hr, NGT output up 6>500 ml/24hrs (less bilious per note), +BM noted 5/17, now on daily bisacodyl supp - Dextrose 26ml/hr (204kcal day). Pivot TF up to 59ml/hr, Prosource 24ml BID,   GI Imaging: none since TPN start GI Surgeries / Procedures: none since TPN start  Central access: CVC triple lumen placed 09/24/20 (removed 5/13); triple lumen PICC placed 09/30/20 TPN start date: 09/29/20  Nutritional Goals (per RD recommendation on 09/29/20): kCal: 11/29/20, Protein: 135-180g, Fluid: >/=2 L/day  - Goal TPN rate is 95 mL/hr (provides 137 g of protein and 2178 kcals per  day) - Tube feed regimen at goal provides 2240 kcal and 157 g protein (Pivot 1.5 at 83ml/hr + Prosourse 56ml bid + Free water 95ml q 4h)  Current Nutrition:  NPO and TPN  Pivot 1.5 at 20 ml/hr - plan for slow increase to goal Prosource TF 45 ml BID (each provides 40 kcal and 11 g protein)  Plan:  No change to TPN today. No mention of increasing TF in MD notes. Continue TPN at goal rate of 95 mL/hr at 1800.  TPN will provide 137g protein and 2178 kCal, meeting 100% of patient needs Electrolytes in TPN: no change today Add standard MVI and trace elements + thiamine and folic acid per dietitian. Continue TCTS q4h SSI for now and adjust as needed Lantus 8 units SQ bid - adjustment per MD D/c Dextrose infusion and change to 1/2 NS with improvement in Na levels and this may improve CBGs as well. Monitor TPN labs Mon/Thurs and prn F/u plans for extubation, toleration of TF/diet and ability to wean TPN   Jack Kadlec S. 31m, PharmD, BCPS Clinical Staff Pharmacist [email or web address] **Pharmacist phone directory can be found on [email or web address] listed under [facility]**

## 2020-10-05 NOTE — Progress Notes (Addendum)
Advanced Heart Failure Rounding Note   Subjective:    Underwent emergent CABG on 5/6 for critical LM disease (LIMA -> LAD, SVG -> OM, SVG -> RCA) 5/6 Cultures negative.  5/8 Possible aspiration. A fib --> started amio drip.  Given 2u PRBCs. Hypotensive in the evening and started on vasopressin + antibiotics.  5/9 IABP removed 5/10 NG placed >1.5 out  5/11 started neostigmime 5/12 Febrile 102   Remains on vent. FiO2 40%. Failed SBT again yesterday. Underwent bronchoscopy for attempted therapeutic aspiration for mucus plugging. ETT exchanged.     Remains on milrinone 0.125. Norepi off. Co-ox 65%   Diuretics held yesterday w/ low CVP. Wt up 5 lb. CVP 8-9   Currently off abx. Febrile overnight. mTemp 100.6. WBC 12>>9>>8.5K.   Hypernatremia resolved, NA 151>>144.    Agitation overnight, required fentanyl and versed. Still mildly agitated and not following commands. BUN 121. SCr stable at 2.70.   Objective:   Weight Range:  Vital Signs:   Temp:  [98.7 F (37.1 C)-100.6 F (38.1 C)] 100.6 F (38.1 C) (05/18 0345) Pulse Rate:  [86-112] 95 (05/18 0305) Resp:  [19-42] 26 (05/18 0600) BP: (90-138)/(47-82) 117/53 (05/18 0600) SpO2:  [92 %-100 %] 97 % (05/18 0600) FiO2 (%):  [40 %-60 %] 40 % (05/18 0305) Weight:  [75.7 kg-77.6 kg] 77.6 kg (05/18 0437) Last BM Date: 10/04/20 (Report from day shift)  Weight change: Filed Weights   10/04/20 0523 10/04/20 0800 10/05/20 0437  Weight: (P) 77.1 kg 75.7 kg 77.6 kg    Intake/Output:   Intake/Output Summary (Last 24 hours) at 10/05/2020 0710 Last data filed at 10/05/2020 0600 Gross per 24 hour  Intake 4615.45 ml  Output 2875 ml  Net 1740.45 ml     PHYSICAL EXAM: CVP 8-9  General:  Critically ill appearing. On vent, mild agitation.  HEENT: + ETT normal Neck: supple. no JVD. Carotids 2+ bilat; no bruits. No lymphadenopathy or thyromegaly appreciated. Cor: PMI nondisplaced. Regular rhythm, mildly tachy rate. No rubs, gallops  or murmurs. Lungs: intubated and clear Abdomen: soft, nontender, nondistended. No hepatosplenomegaly. No bruits + large ventral hernia, reducible  Good bowel sounds. Extremities: no cyanosis, clubbing, rash, edema Neuro: awake on vent but not following commands.     Telemetry: SR w/ PACs 90s-low 100s Personally reviewed  Labs: Basic Metabolic Panel: Recent Labs  Lab 09/29/20 0602 09/30/20 0337 09/30/20 1556 10/01/20 1307 10/02/20 0355 10/03/20 0424 10/04/20 0741 10/04/20 1356 10/05/20 0503  NA 140 144   < > 143 145 147* 152* 151* 144  K 3.2* 2.4*   < > 3.4* 3.6 3.7 4.7 4.9 4.5  CL 99 102   < > 100 105 112* 121* 120* 116*  CO2 23 29   < > 34* $Rem'30 29 25 23 'RCQE$ 21*  GLUCOSE 143* 195*   < > 174* 181* 160* 176* 212* 305*  BUN 77* 80*   < > 85* 88* 100* 115* 123* 121*  CREATININE 3.46* 2.92*   < > 2.58* 2.37* 2.48* 2.70* 2.71* 2.70*  CALCIUM 7.9* 8.4*   < > 8.5* 8.7* 8.5* 8.1* 7.8* 7.5*  MG 2.2 2.4  --  2.1  --  2.2  --   --   --   PHOS 6.7* 4.4  --   --   --  1.5*  --   --   --    < > = values in this interval not displayed.    Liver Function Tests: Recent Labs  Lab 09/29/20 0602 09/30/20 0337 10/03/20 0424 10/04/20 0741  AST 29 29 42* 38  ALT $Re'9 7 17 24  'lyP$ ALKPHOS 114 117 72 65  BILITOT 1.1 0.8 1.0 1.1  PROT 5.1* 5.3* 5.4* 6.1*  ALBUMIN 1.6* 1.6* 1.7* 1.9*   No results for input(s): LIPASE, AMYLASE in the last 168 hours. No results for input(s): AMMONIA in the last 168 hours.  CBC: Recent Labs  Lab 09/30/20 0337 10/01/20 0319 10/02/20 0355 10/03/20 0424 10/04/20 0444 10/05/20 0350  WBC 11.0* 11.9* 12.9* 12.1* 9.0 8.5  NEUTROABS 9.8*  --   --  9.5*  --   --   HGB 9.2* 8.9* 8.9* 8.2* 7.9* 7.2*  HCT 28.2* 28.0* 28.9* 26.9* 26.9* 23.4*  MCV 81.5 83.3 85.5 87.1 100.4* 109.9*  PLT 90* 109* 140* 181 196 177    Cardiac Enzymes: No results for input(s): CKTOTAL, CKMB, CKMBINDEX, TROPONINI in the last 168 hours.  BNP: BNP (last 3 results) Recent Labs     09/23/20 0311  BNP 1,191.8*    ProBNP (last 3 results) No results for input(s): PROBNP in the last 8760 hours.    Other results:  Imaging: DG CHEST PORT 1 VIEW  Result Date: 10/04/2020 CLINICAL DATA:  Endotracheal tube placement. EXAM: PORTABLE CHEST 1 VIEW COMPARISON:  10/02/2020 FINDINGS: Endotracheal tube is in place with tip approximately 4 centimeters above the carina. LEFT-sided PICC line tip overlies the superior vena cava. Of feeding tube and nasogastric tube replacement tips beyond the edge of the image. The heart size is normal. Patchy interstitial infiltrates are again identified throughout the lungs, LEFT greater than RIGHT. Postop changes in the RIGHT shoulder. IMPRESSION: Stable appearance of the chest. Persistent bilateral infiltrates LEFT greater than RIGHT. Electronically Signed   By: Nolon Nations M.D.   On: 10/04/2020 14:51     Medications:     Scheduled Medications: . amiodarone  200 mg Per Tube BID  . aspirin  324 mg Per Tube Daily  . bisacodyl  10 mg Rectal Daily  . chlorhexidine gluconate (MEDLINE KIT)  15 mL Mouth Rinse BID  . Chlorhexidine Gluconate Cloth  6 each Topical Daily  . feeding supplement (PIVOT 1.5 CAL)  1,000 mL Per Tube Q24H  . feeding supplement (PROSource TF)  45 mL Per Tube BID  . free water  100 mL Per Tube Q4H  . heparin injection (subcutaneous)  5,000 Units Subcutaneous Q8H  . hydrALAZINE  25 mg Per Tube Q8H  . insulin aspart  0-24 Units Subcutaneous Q4H  . insulin glargine  8 Units Subcutaneous BID  . ipratropium-albuterol  3 mL Nebulization Q6H  . isosorbide dinitrate  10 mg Per Tube TID  . mouth rinse  15 mL Mouth Rinse 10 times per day  . metoCLOPramide (REGLAN) injection  5 mg Intravenous Q6H  . neostigmine  0.25 mg Subcutaneous Q6H  . pantoprazole (PROTONIX) IV  40 mg Intravenous Q24H  . polyethylene glycol  17 g Per Tube BID  . sodium chloride flush  10-40 mL Intracatheter Q12H  . sodium chloride flush  3 mL Intravenous  Q12H    Infusions: . dexmedetomidine (PRECEDEX) IV infusion 0.7 mcg/kg/hr (10/05/20 0600)  . dextrose 75 mL/hr at 10/05/20 0600  . fentaNYL infusion INTRAVENOUS Stopped (10/01/20 0024)  . lactated ringers 10 mL/hr at 10/01/20 1900  . lactated ringers Stopped (10/01/20 1749)  . milrinone 0.125 mcg/kg/min (10/05/20 0600)  . TPN ADULT (ION) 95 mL/hr at 10/05/20 0600    PRN Medications: acetaminophen (  TYLENOL) oral liquid 160 mg/5 mL, albuterol, fentaNYL (SUBLIMAZE) injection, lip balm, metoprolol tartrate, midazolam, ondansetron (ZOFRAN) IV, sodium chloride flush, sodium chloride flush, sorbitol   Assessment/Plan:   1. 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  - Co-ox 65% on milrinone 0.125. Off NE - Wt and volume status trending up off diuretics, CVP 8-9 today. BUN 121. ?HD  - No ACE/ARB/ARNI/MRA with AKI. No b-blocker yet with shock.  - c/w hydral/nitrates for afterload reduction   2. NSTEMI with critical CAD - 95% LM and high grade ostial RCA - CABG on 5/6 for critical LM disease (LIMA -> LAD, SVG -> OM, SVG -> RCA) - Continue ASA/statin  - Add Plavix soon   3. Acute hypoxic respiratory failure due to pulmonary edema - on vent - Possible aspiration 5/8 & 5/10  - 5/6 Blood CX - NG  - Antibiotics completed 5/16 - CCM following - SBT agan today. Mental status remains major issue (suspect uremia contributing, ? CVVH). May need trach to enable wean    4. AKI / Uremia  - due to ATN - Cr 1.0 -> 1.7 -> 1.95-->2.3 ->3->3.3->3.5->2.9-> 2.8 -> 2.7 -> 2.37->2.48->2.71->2.70 - Uremia w/ worsening mental status, BUN 121 - Needs HD. D/w primary team  - Support hemodynamics.    5. Ventral Hernia  - 5/10 NG placed to decompress   6. ID - Finished zosyn for possible aspiration 5/16.  - Blood CX 5/6- NGTD, UC 5/13  NGTD  - TMax 100.6. Follow fever curve. May need restart abx if fevers persists    Length of Stay: [address] Ladoris Gene 10/05/2020, 7:10 AM  Advanced Heart Failure Team Pager [phone] (M-F; [place])  Please contact [facility] for night-coverage after hours (4p -7a ) and weekends on [email or web address]   Agree with above.    Remains on vent. ETT exchanged yesterday for mucous plugging. Remains agitated. On milrinone 0.125. Co-ox 65%  Scr stable at 2.7. Volume status ok. Remains tachypneic with SBT.   Had several BMS overnight.   General:  On vent agitated. Will open eyes. Not following commands HEENT: normal + ETT Neck: supple. JVP 7-8 Carotids 2+ bilat; no bruits. No lymphadenopathy or thryomegaly appreciated. Cor: PMI nondisplaced. Regular rate & rhythm. No rubs, gallops or murmurs. Lungs: clear Abdomen: soft, nontender, nondistended. No hepatosplenomegaly. No bruits or masses. + ventral hernia Extremities: no cyanosis, clubbing, rash, tr edema Neuro: as above  Still struggling with vent wean due to agitation and tachypnea. HF status otherwise optimized. Will continue to hold diuretics. Hopefully BUN/Cr will continue to improve which may help agitation. Appreciate CCM's input.   CRITICAL CARE Performed by: Glori Bickers  Total critical care time: 35 minutes  Critical care time was exclusive of separately billable procedures and treating other patients.  Critical care was necessary to treat or prevent imminent or life-threatening deterioration.  Critical care was time spent personally by me (independent of midlevel providers or residents) on the following activities: development of treatment plan with patient and/or surrogate as well as nursing, discussions with consultants, evaluation of patient's response to treatment, examination of patient, obtaining history from patient or surrogate, ordering and performing treatments and interventions, ordering and review of  laboratory studies, ordering and review of radiographic studies, pulse oximetry and re-evaluation of patient's condition.  Glori Bickers, MD  8:14 AM

## 2020-10-05 NOTE — Consult Note (Signed)
[facility] Consult Note     Date: 10/05/2020                  Patient Name:  Jack Vance  MRN: [id number]  DOB: 10-Oct-1940  Age / Sex: 80 y.o., male         PCP: [facility]                 Service Requesting Consult: PCCM                 Reason for Consult: AKI            Chief Complaint: ACS HPI: 79 year old man with a pertinent PMH of asthma, abdominal hernia, HLD, and CAD who presented to [facility] of burning chest pain.  Patient had associated shortness of breath, paroxysmal nocturnal dyspnea, and orthopnea and states that this progressed over the weeks leading up to his hospitalization.  In the ED the patients shortness of breath progressed to requiring BiPAP symptoms.  He was found to have troponins greater than 7000 and a BNP of 1191.  EKG showed diffuse repolarization abnormalities.  Cardiology was consulted due to concern for NSTEMI, and the patient was started on aspirin and heparin.  Bedside ultrasound at that time by Dr. Pathology showed severe LV systolic dysfunction with EF around 10% and global hypokinesis. He was subsequently transferred to [facility] for emergent cardiac catheterization.  Prior to 2 transfer, the patient developed significant hypotension requiring norepinephrine and SHOB requiring intubation.   Patient underwent left heart cath showing severe left main disease and ostial right coronary artery disease.  Echo showed reduced biventricular function with RV moderately reduced and LV severely reduced less than 20%.  Intra-aortic balloon pump was subsequently placed and the patient was emergently brought to the OR for revascularization.  Patient was transferred to the cardiac ICU status post CABG x4 with labile blood pressures requiring pressors and type 1, second-degree AV block present on EKG.   Patient's cardiac function improved with repeat echo showing LVEF of 30 with severe septal hypokinesis and mildly decreased RV systolic function.   Patient was started on Lasix drip with improvement of CVP despite less than optimal urine output. He was weaned off of pressors and IABP as pressures stabilized and lactic acid trended downward.  Patient's clinical course was subsequently complicated by possible aspiration event, atrial fibrillation, and hypotension with a 2 gram drop in Hgb. For which, he was started on antibiotics, amiodarone, and pressures/2 units of PRBCs, respectively.   Patients respiratory status complicated by increased secretions/mucus plugging, increased agitation resulting in failure of multiple SBT and increased sedation requirements to maintain vent synchrony.   Nephrology was consulted for further evaluation and management of the patients acute kidney injury with concern for uremic encephalopathy. Patients baseline kidney function is creatinine of 1.0 and a GFR greater than 60.  Since presentation the patient's kidney function has progressively worsened (creatinine of 2.70 and a BUN of 121)  in the setting of acute coronary syndrome with cardiogenic shock.  The patient is minimally responsive on evaluation and therefore was not able to answer questions regarding his uremic symptoms.     Past Medical History:  Diagnosis Date  . Arthritis   . Asthma   . DDD (degenerative disc disease), lumbar   . Hernia of abdominal wall   . RLS (restless legs syndrome)     Past Surgical History:  Procedure Laterality Date  . CORONARY ARTERY BYPASS GRAFT  N/A 09/23/2020   Procedure: CORONARY ARTERY BYPASS GRAFTING (CABG)  TIMES 4 USING LEFT GREATER SAPHENOUS VEIN HARVESTED ENDOSCOPICALLY AND LEFT INTERNAL MAMMARY ARTERY;  Surgeon: Lajuana Matte, MD;  Location: [place];  Service: Open Heart Surgery;  Laterality: N/A;  . HERNIA REPAIR    . IABP INSERTION N/A 09/23/2020   Procedure: IABP Insertion;  Surgeon: Adrian Prows, MD;  Location: [facility];  Service: Cardiovascular;  Laterality: N/A;  . RIGHT/LEFT HEART CATH AND  CORONARY ANGIOGRAPHY N/A 09/23/2020   Procedure: RIGHT/LEFT HEART CATH AND CORONARY ANGIOGRAPHY;  Surgeon: Adrian Prows, MD;  Location: [facility] [facility];  Service: Cardiovascular;  Laterality: N/A;  . SHOULDER SURGERY    . TEE WITHOUT CARDIOVERSION N/A 09/23/2020   Procedure: TRANSESOPHAGEAL ECHOCARDIOGRAM (TEE);  Surgeon: Lajuana Matte, MD;  Location: [place];  Service: Open Heart Surgery;  Laterality: N/A;    Family History  Problem Relation Age of Onset  . Hypertension Maternal Aunt    Social History:  reports that he has never smoked. He has never used smokeless tobacco. He reports current alcohol use. He reports that he does not use drugs.  Allergies:  Allergies  Allergen Reactions  . Cheese     Causes asthma  . Morphine And Related Rash    Medications Prior to Admission  Medication Sig Dispense Refill  . acetaminophen (TYLENOL) 325 MG tablet Take 650 mg by mouth every 4 (four) hours as needed for pain.    Marland Kitchen aspirin EC 81 MG tablet Take 81 mg by mouth daily. Swallow whole.    Marland Kitchen atorvastatin (LIPITOR) 80 MG tablet Take 80 mg by mouth daily.    . benzonatate (TESSALON) 100 MG capsule Take 100 mg by mouth 3 (three) times daily as needed for cough.    . diclofenac Sodium (VOLTAREN) 1 % GEL Apply 2 g topically 2 (two) times daily as needed for pain.    . ferrous gluconate (FERGON) 324 MG tablet Take 324 mg by mouth daily.    . finasteride (PROSCAR) 5 MG tablet Take 5 mg by mouth daily.    Marland Kitchen FLUoxetine (PROZAC) 20 MG capsule Take 20 mg by mouth daily.    . fluticasone (FLONASE) 50 MCG/ACT nasal spray Place 1 spray into both nostrils daily.    . fluticasone-salmeterol (ADVAIR) 250-50 MCG/ACT AEPB Inhale 1 puff into the lungs 2 (two) times daily.    Marland Kitchen ibuprofen (ADVIL) 200 MG tablet Take 400 mg by mouth every 6 (six) hours as needed for mild pain.    . Ipratropium-Albuterol (COMBIVENT) 20-100 MCG/ACT AERS respimat Inhale 1 puff into the lungs every 6 (six) hours.    . lidocaine  (LIDODERM) 5 % Place 1 patch onto the skin daily. Remove & Discard patch within 12 hours or as directed by MD    . montelukast (SINGULAIR) 10 MG tablet Take 10 mg by mouth daily.    . Multiple Vitamin (MULTIVITAMIN) capsule Take 1 capsule by mouth daily.    Marland Kitchen omeprazole (PRILOSEC) 40 MG capsule Take 40 mg by mouth daily.    Marland Kitchen rOPINIRole (REQUIP) 2 MG tablet Take 4 mg by mouth 3 (three) times daily.    . tamsulosin (FLOMAX) 0.4 MG CAPS capsule Take 0.4 mg by mouth daily.    . Tiotropium Bromide Monohydrate 2.5 MCG/ACT AERS Inhale 2 puffs into the lungs daily.    . traMADol (ULTRAM) 50 MG tablet Take 1 tablet by mouth 3 (three) times daily as needed for pain.    Marland Kitchen  triamterene-hydrochlorothiazide (MAXZIDE-25) 37.5-25 MG tablet Take 1 tablet by mouth daily.      Results for orders placed or performed during the hospital encounter of 09/23/20 (from the past 48 hour(s))  Glucose, capillary     Status: Abnormal   Collection Time: 10/03/20  3:41 PM  Result Value Ref Range   Glucose-Capillary 150 (H) 70 - 99 mg/dL    Comment: Glucose reference range applies only to samples taken after fasting for at least 8 hours.  Glucose, capillary     Status: Abnormal   Collection Time: 10/03/20  7:20 PM  Result Value Ref Range   Glucose-Capillary 154 (H) 70 - 99 mg/dL    Comment: Glucose reference range applies only to samples taken after fasting for at least 8 hours.  Glucose, capillary     Status: Abnormal   Collection Time: 10/03/20 11:26 PM  Result Value Ref Range   Glucose-Capillary 159 (H) 70 - 99 mg/dL    Comment: Glucose reference range applies only to samples taken after fasting for at least 8 hours.  Glucose, capillary     Status: Abnormal   Collection Time: 10/04/20  3:32 AM  Result Value Ref Range   Glucose-Capillary 188 (H) 70 - 99 mg/dL    Comment: Glucose reference range applies only to samples taken after fasting for at least 8 hours.  Cooxemetry Panel (carboxy, met, total hgb, O2 sat)      Status: Abnormal   Collection Time: 10/04/20  4:44 AM  Result Value Ref Range   Total hemoglobin 9.3 (L) 12.0 - 16.0 g/dL   O2 Saturation 74.5 %   Carboxyhemoglobin 1.0 0.5 - 1.5 %   Methemoglobin 1.8 (H) 0.0 - 1.5 %    Comment: Performed at [facility] [address] [place], [place] [address]  CBC     Status: Abnormal   Collection Time: 10/04/20  4:44 AM  Result Value Ref Range   WBC 9.0 4.0 - 10.5 K/uL   RBC 2.68 (L) 4.22 - 5.81 MIL/uL   Hemoglobin 7.9 (L) 13.0 - 17.0 g/dL   HCT 26.9 (L) 39.0 - 52.0 %   MCV 100.4 (H) 80.0 - 100.0 fL    Comment: REPEATED TO VERIFY DELTA CHECK NOTED    MCH 29.5 26.0 - 34.0 pg   MCHC 29.4 (L) 30.0 - 36.0 g/dL   RDW 20.6 (H) 11.5 - 15.5 %   Platelets 196 150 - 400 K/uL   nRBC 0.0 0.0 - 0.2 %    Comment: Performed at [facility], [place] [address] [place], [place] [address]  Comprehensive metabolic panel     Status: Abnormal   Collection Time: 10/04/20  7:41 AM  Result Value Ref Range   Sodium 152 (H) 135 - 145 mmol/L   Potassium 4.7 3.5 - 5.1 mmol/L    Comment: NO VISIBLE HEMOLYSIS   Chloride 121 (H) 98 - 111 mmol/L   CO2 25 22 - 32 mmol/L   Glucose, Bld 176 (H) 70 - 99 mg/dL    Comment: Glucose reference range applies only to samples taken after fasting for at least 8 hours.   BUN 115 (H) 8 - 23 mg/dL   Creatinine, Ser 2.70 (H) 0.61 - 1.24 mg/dL   Calcium 8.1 (L) 8.9 - 10.3 mg/dL   Total Protein 6.1 (L) 6.5 - 8.1 g/dL   Albumin 1.9 (L) 3.5 - 5.0 g/dL   AST 38 15 - 41 U/L   ALT 24 0 -  44 U/L   Alkaline Phosphatase 65 38 - 126 U/L   Total Bilirubin 1.1 0.3 - 1.2 mg/dL   GFR, Estimated 23 (L) >60 mL/min    Comment: (NOTE) Calculated using the CKD-EPI Creatinine Equation (2021)    Anion gap 6 5 - 15    Comment: Performed at [facility] [address] [place], [place] [address]  Glucose, capillary     Status: Abnormal   Collection Time: 10/04/20  8:15 AM  Result Value Ref Range   Glucose-Capillary 169 (H) 70 - 99  mg/dL    Comment: Glucose reference range applies only to samples taken after fasting for at least 8 hours.  Glucose, capillary     Status: Abnormal   Collection Time: 10/04/20 11:51 AM  Result Value Ref Range   Glucose-Capillary 159 (H) 70 - 99 mg/dL    Comment: Glucose reference range applies only to samples taken after fasting for at least 8 hours.  Basic metabolic panel     Status: Abnormal   Collection Time: 10/04/20  1:56 PM  Result Value Ref Range   Sodium 151 (H) 135 - 145 mmol/L   Potassium 4.9 3.5 - 5.1 mmol/L   Chloride 120 (H) 98 - 111 mmol/L   CO2 23 22 - 32 mmol/L   Glucose, Bld 212 (H) 70 - 99 mg/dL    Comment: Glucose reference range applies only to samples taken after fasting for at least 8 hours.   BUN 123 (H) 8 - 23 mg/dL   Creatinine, Ser 2.71 (H) 0.61 - 1.24 mg/dL   Calcium 7.8 (L) 8.9 - 10.3 mg/dL   GFR, Estimated 23 (L) >60 mL/min    Comment: (NOTE) Calculated using the CKD-EPI Creatinine Equation (2021)    Anion gap 8 5 - 15    Comment: Performed at [facility] [address] [place], [place] [address]  Glucose, capillary     Status: Abnormal   Collection Time: 10/04/20  4:24 PM  Result Value Ref Range   Glucose-Capillary 203 (H) 70 - 99 mg/dL    Comment: Glucose reference range applies only to samples taken after fasting for at least 8 hours.  Glucose, capillary     Status: Abnormal   Collection Time: 10/04/20  7:54 PM  Result Value Ref Range   Glucose-Capillary 165 (H) 70 - 99 mg/dL    Comment: Glucose reference range applies only to samples taken after fasting for at least 8 hours.  Glucose, capillary     Status: Abnormal   Collection Time: 10/04/20 11:42 PM  Result Value Ref Range   Glucose-Capillary 209 (H) 70 - 99 mg/dL    Comment: Glucose reference range applies only to samples taken after fasting for at least 8 hours.  Glucose, capillary     Status: Abnormal   Collection Time: 10/05/20  3:42 AM  Result Value Ref Range    Glucose-Capillary 180 (H) 70 - 99 mg/dL    Comment: Glucose reference range applies only to samples taken after fasting for at least 8 hours.  Cooxemetry Panel (carboxy, met, total hgb, O2 sat)     Status: Abnormal   Collection Time: 10/05/20  3:50 AM  Result Value Ref Range   Total hemoglobin 11.8 (L) 12.0 - 16.0 g/dL   O2 Saturation 65.2 %   Carboxyhemoglobin 0.7 0.5 - 1.5 %   Methemoglobin 1.6 (H) 0.0 - 1.5 %    Comment: Performed at [facility]  [address] [place], [place] [address]  CBC     Status: Abnormal   Collection Time: 10/05/20  3:50 AM  Result Value Ref Range   WBC 8.5 4.0 - 10.5 K/uL   RBC 2.13 (L) 4.22 - 5.81 MIL/uL   Hemoglobin 7.2 (L) 13.0 - 17.0 g/dL   HCT 23.4 (L) 39.0 - 52.0 %   MCV 109.9 (H) 80.0 - 100.0 fL    Comment: REPEATED TO VERIFY DELTA CHECK NOTED    MCH 33.8 26.0 - 34.0 pg   MCHC 30.8 30.0 - 36.0 g/dL   RDW 21.2 (H) 11.5 - 15.5 %   Platelets 177 150 - 400 K/uL   nRBC 0.0 0.0 - 0.2 %    Comment: Performed at [facility], [place] [address] [place], [place] [address]  Basic metabolic panel     Status: Abnormal   Collection Time: 10/05/20  5:03 AM  Result Value Ref Range   Sodium 144 135 - 145 mmol/L   Potassium 4.5 3.5 - 5.1 mmol/L   Chloride 116 (H) 98 - 111 mmol/L   CO2 21 (L) 22 - 32 mmol/L   Glucose, Bld 305 (H) 70 - 99 mg/dL    Comment: Glucose reference range applies only to samples taken after fasting for at least 8 hours.   BUN 121 (H) 8 - 23 mg/dL   Creatinine, Ser 2.70 (H) 0.61 - 1.24 mg/dL   Calcium 7.5 (L) 8.9 - 10.3 mg/dL   GFR, Estimated 23 (L) >60 mL/min    Comment: (NOTE) Calculated using the CKD-EPI Creatinine Equation (2021)    Anion gap 7 5 - 15    Comment: Performed at [facility] [address], [place], [place] [address]  Glucose, capillary     Status: Abnormal   Collection Time: 10/05/20  8:00 AM  Result Value Ref Range   Glucose-Capillary 145 (H) 70 - 99 mg/dL    Comment: Glucose reference  range applies only to samples taken after fasting for at least 8 hours.  Glucose, capillary     Status: Abnormal   Collection Time: 10/05/20 11:25 AM  Result Value Ref Range   Glucose-Capillary 163 (H) 70 - 99 mg/dL    Comment: Glucose reference range applies only to samples taken after fasting for at least 8 hours.   DG CHEST PORT 1 VIEW  Result Date: 10/04/2020 CLINICAL DATA:  Endotracheal tube placement. EXAM: PORTABLE CHEST 1 VIEW COMPARISON:  10/02/2020 FINDINGS: Endotracheal tube is in place with tip approximately 4 centimeters above the carina. LEFT-sided PICC line tip overlies the superior vena cava. Of feeding tube and nasogastric tube replacement tips beyond the edge of the image. The heart size is normal. Patchy interstitial infiltrates are again identified throughout the lungs, LEFT greater than RIGHT. Postop changes in the RIGHT shoulder. IMPRESSION: Stable appearance of the chest. Persistent bilateral infiltrates LEFT greater than RIGHT. Electronically Signed   By: Nolon Nations M.D.   On: 10/04/2020 14:51     Blood pressure (!) 120/56, pulse 95, temperature (!) 97.1 F (36.2 C), temperature source Axillary, resp. rate (!) 30, height $RemoveBe'5\' 6"'oDeDdGmSx$  (1.676 m), weight 77.6 kg, SpO2 100 %. Physical Exam Constitutional:      Appearance: He is ill-appearing and diaphoretic.     Comments: Sedated and minimally responsive  HENT:     Head: Normocephalic and atraumatic.  Eyes:     Pupils: Pupils are equal, round, and reactive to light.  Cardiovascular:     Rate  and Rhythm: Normal rate and regular rhythm.     Pulses: Normal pulses.  Pulmonary:     Comments: Course upper respiratory breath sounds and ET tube in place.  Abdominal:     General: There is distension.     Hernia: A hernia is present.  Musculoskeletal:        General: Swelling (upper and lower extremity swelling (2+ pittin edema)) present.  Skin:    General: Skin is warm.     Coloration: Skin is not jaundiced.       Assessment/Plan 1.  Acute kidney injury secondary to acute tubular necrosis in the setting of cardiogenic shock. Patient has had progressively worsening kidney function with a creatinine of 2.7 which is elevated from his baseline of 1.0.  His GFR is currently 23.  Patient has good urine output with significant dark yellow urine in his Foley bag.  Patient was not responsive enough to participate with interview and therefore unable to comment regarding his clinical symptoms of uremia.  Patient's electrolytes are relatively within normal limits and the patient does not have an anion gap metabolic acidosis as well. I agree with the patients treatment team and believe the patient likely has acute tubular necrosis.  Although the patient's clinical status is tenuous and may ultimately require RRT in the near future, therethere is no emergent need for hemodialysis as I suspect his kidney function will be slow to improve now that his hemodynamics are more stable.  Although the patient's BUN is elevated to the 120s, it is difficult to determine if the patient's symptoms are attributable to uremia as there are many confounding factors contributing to his elevation in BUN.  Specifically, the patient's receiving a high protein diet via TPN, he has a history of iron def anemia which raises concern for occult upper GI bleed, and he has been vigerously diuresis, which can elevated BUN out of proportion to his rising creatinine. -Continue to hold diuretic therapy -Continue pressors as needed to maintain maps greater than 65 -Consider switching milrinone to alternative agent in setting of AKI. -Avoid nephrotoxic medications including ACE/ARB -We will get urine sodium and urinalysis to confirm evidence of ATN -Continue PPI and consider further work-up for upper GI bleed such as FOBT. -We will monitor kidney function, electrolytes and urine output closely. -Consider iron supplementation in the near future.   2.  CAD  complicated by ACS/NSTEMI s/p 4x CABG: -Continue ASA/statin per cardiology and advanced heart failure team  3.  Acute systolic heart failure complicated by cardiogenic shock: -Continue pressors monitor Jshon Ibe ox -Continue with guideline directed medical therapy and agree with avoiding ACE/ARB's and beta-blockers.   4.  Acute respiratory failure secondary to possible aspiration pneumonia - Continue Zosyn for possible aspiration pneumonia - Continue vent management per [place], D.O.  Internal Medicine Resident, PGY-2 [facility]  Pager: [phone] 11:53 AM, 10/05/2020

## 2020-10-05 NOTE — Progress Notes (Signed)
      [address]       [place] [address]             [phone]      Intubated, sedated  BP 112/61   Pulse 95   Temp (!) 100.9 F (38.3 C) (Axillary)   Resp (!) 37   Ht 5\' 6"  (1.676 m)   Wt 77.6 kg   SpO2 98%   BMI 27.61 kg/m  CVP= 4 Milrinone 0.125 PRVC 22/ 40%/ 5 PEEP CBG well controlled  Continue current treatments  Shaylinn Hladik C. , MD [facility] [phone]

## 2020-10-05 NOTE — Progress Notes (Signed)
NAME:  Jack Vance, MRN:  [id number], DOB:  [birth year], LOS: 12 ADMISSION DATE:  09/23/2020, CONSULTATION DATE:  09/24/2019 REFERRING MD:  Jacinto Halim, CHIEF COMPLAINT:  Acute coronary syndrome   History of Present Illness:  80 year old man who presented with cardiogenic shock secondary to NSTEMI.  Presented with new onset RSCP and severe dyspnea. Found to have acute pulmonary edema and heart failure; intubated and sent for urgent LHC which revealed 3V CAD.  IABP inserted and patient underwent urgent CABG. IABP removed 5/9. Continues to require full mechanical ventilation support.  Pertinent  Medical History   Past Medical History:  Diagnosis Date  . Arthritis   . Asthma   . DDD (degenerative disc disease), lumbar   . Hernia of abdominal wall   . RLS (restless legs syndrome)    Significant Hospital Events: Including procedures, antibiotic start and stop dates in addition to other pertinent events   . 5/6 CABG with Dr Cliffton Asters with LIMA to LAD, SVG to RCA d and SVG to ramus. LV normal and returned to ICU with no vasopressor support.  . No post-operative bleeding but poor RV performance on hemodynamics.  . 5/8 significant hemodynamic instability with labile blood pressure and vasopressor requirements yesterday.  Episode of emesis but no evidence of bowel obstruction.  Evidence of pericardial tamponade. . 5/9 improved hemodynamic, able now to tolerate balloon pump weaning and removal. . 5/11 Stable hemodynamics, continuing diuresis. Improved UOP. TPN consult for initiation 5/12 in the setting of ileus, prolonged NPO status.  . 5/13 sedated, diuresis, milrinone, PICC line today, CVL removed  . 5/14 tolerated short SBT/SAT on Precedex . 5/16 Not tolerating SBT due to significant tachypnea to 50s, placed back on PRVC. Marland Kitchen 5/17 Improved SBT toleration, weaning milrinone, minimizing sedation. Bronched w/ c/f mucus plugging, airways clear, ETT exchanged due to significant buildup. Diuresis  held. . 5/18 More agitated overnight requiring increased sedation with addition of Fentanyl/Versed, Seroquel started. BUN continues to rise. Nephro consult.   Interim History / Subjective:  Increased agitation overnight Required increased sedation with addition of Fentanyl/Versed Seroquel started Tachypnea slightly improved, but more restless today  Objective   Blood pressure (!) 161/64, pulse 95, temperature (!) 100.6 F (38.1 C), temperature source Oral, resp. rate (!) 32, height 5\' 6"  (1.676 m), weight 77.6 kg, SpO2 97 %. CVP:  [7 mmHg] 7 mmHg  Vent Mode: PRVC FiO2 (%):  [40 %-60 %] 40 % Set Rate:  [2 bmp-22 bmp] 22 bmp Vt Set:  [560 mL] 560 mL PEEP:  [5 cmH20] 5 cmH20 Pressure Support:  [20 cmH20] 20 cmH20 Plateau Pressure:  [19 cmH20-25 cmH20] 21 cmH20   Intake/Output Summary (Last 24 hours) at 10/05/2020 0832 Last data filed at 10/05/2020 0600 Gross per 24 hour  Intake 4425 ml  Output 2675 ml  Net 1750 ml   Filed Weights   10/04/20 0523 10/04/20 0800 10/05/20 0437  Weight: (P) 77.1 kg 75.7 kg 77.6 kg   Physical Examination: General: Chronically ill-appearing male in NAD. Mildly agitated and appears uncomfortable. HEENT: [place]/AT, anicteric sclera, PERRL, moist mucous membranes. ETT/OG/Cortrak in place. Neuro: Lethargic. Responds to verbal stimuli. Not following commands. Moves all 4 extremities spontaneously. CV: Irregularly irregular rhythm. No murmur noted. PULM: Tachypneic to mid 30s with mildly labored breathing on vent (PEEP 5, FiO2 40%). Lung fields with coarse upper airway sounds. GI: Soft, nontender, moderately distended with protuberant ventral hernia (soft, reducible). Normoactive bowel sounds. Extremities: Trace LE edema noted. Skin: Warm/dry, no rashes.  Labs/imaging  that I have personally reviewed: (right click and "Reselect all SmartList Selections" daily)  WBC 8.1 (9.0), H&H 7.2/23.4 (7.9/26.9), Plt 177 (196)  Na 144 (151), K 4.5, CO2 21 (25), BUN 121  (123), Cr 2.7 (2.71), peak 2.9 this admission  Glucoses 145-209 last 24H  Co-ox 65.2 (74.5) %  CXR 5/17PM (Post-bronch/ETT exchange) persistent bilateral infiltrates L > R  Resolved Hospital Problem List   NSTEMI, Thrombocytopenia  Assessment & Plan:   Acute hypoxemic hypercapnic respiratory failure requiring intubation mechanical ventilation Possible aspiration - Continue full vent support (4-8cc/kg IBW) - Wean FiO2 for O2 sat > 90% - Daily WUA/SBT, continues to tolerate only short SBTs 2/2 tachypnea - VAP bundle - Pulmonary hygiene - PAD protocol for sedation: Precedex for goal RASS 0 to -1; required Fentanyl/Versed overnight - Seroquel added for agitation - S/p Zosyn course - Extubation continues to be precluded by weakness evidenced by increased WOB/tachypnea and mental status with continued need for sedation for vent tolerance - Will need to discuss trach in the upcoming days/weeks if unable to tolerate weaning  Critically ill due to RV dysfunction following ACS and cardiopulmonary bypass  Cardiogenic shock NSTEMI with culprit RCA involvement Coronary artery disease - Appreciate management of TCTS/HF teams - Diuresis per primary team - Continue amiodarone - Milrinone at 0.125  Acute kidney injury Uremia Hypernatremia Likely multifactorial; favor primarily prerenal in the setting of hemodynamic insults, cardiogenic shock, aggressive diuresis for volume management, medications. Baseline Cr ~1.0.  - Trend BMP - Replete electrolytes as indicated - D5 for hypernatremia management; reduce FWF to limit enteral agents - Monitor I&Os - F/u urine studies - Avoid nephrotoxic agents as able - Ensure adequate renal perfusion - Nephro consult today, given worsening uremia  Gastric ileus, improved Increasing abdominal distention and hypoactive bowel sounds. Tolerating trickle TFs. - Increased abdominal distention 5/17, though no e/o feeds in OGT output - TF rate at 25mL/hr  via Cortrak - Continue OGT - Continue TPN - Bowel regimen  Agitated delirium controlled Alcohol abuse - Delirium precautions - Seroquel as above - Begin Klonopin - Consider valproate if ongoing agitation/poor mental status  Best Practice: (right click and "Reselect all SmartList Selections" daily)  Diet:  Tube Feed  + TPN Pain/Anxiety/Delirium protocol (if indicated): Yes (RASS goal 0) to -1 VAP protocol (if indicated): Yes DVT prophylaxis: Subcutaneous Heparin  GI prophylaxis: PPI Glucose control:  SSI Yes Central venous access:  Yes, and it is still needed Arterial line:  Yes, and it is still needed Foley:  Yes, and it is still needed Mobility:  bed rest  PT consulted: N/A Last date of multidisciplinary goals of care discussion [Per Primary] Code Status:  full code Disposition: ICU  Critical care time: 43 minutes   Tim Lair, PA-C [facility] 10/05/20 8:32 AM  Please see [email or web address] for pager details.  From 7A-7P if no response, please call [phone] After hours, please call E-Link [phone]

## 2020-10-05 NOTE — Progress Notes (Addendum)
TCTS DAILY ICU PROGRESS NOTE                   [place]            [employer] [address]          [phone]   12 Days Post-Op Procedure(s) (LRB): CORONARY ARTERY BYPASS GRAFTING (CABG)  TIMES 4 USING LEFT GREATER SAPHENOUS VEIN HARVESTED ENDOSCOPICALLY AND LEFT INTERNAL MAMMARY ARTERY (N/A) TRANSESOPHAGEAL ECHOCARDIOGRAM (TEE) (N/A)  Total Length of Stay:  LOS: 12 days   Subjective: Patient remains on vent, sedated  Objective: Vital signs in last 24 hours: Temp:  [98.7 F (37.1 C)-100.6 F (38.1 C)] 100.6 F (38.1 C) (05/18 0345) Pulse Rate:  [86-112] 95 (05/18 0305) Cardiac Rhythm: Normal sinus rhythm;Atrial fibrillation (05/18 0400) Resp:  [19-42] 32 (05/18 0700) BP: (90-161)/(47-69) 161/64 (05/18 0700) SpO2:  [92 %-100 %] 97 % (05/18 0700) FiO2 (%):  [40 %-60 %] 40 % (05/18 0305) Weight:  [77.6 kg] 77.6 kg (05/18 0437)  Filed Weights   10/04/20 0523 10/04/20 0800 10/05/20 0437  Weight: (P) 77.1 kg 75.7 kg 77.6 kg    Weight change:    Hemodynamic parameters for last 24 hours: CVP:  [7 mmHg] 7 mmHg  Intake/Output from previous day: 05/17 0701 - 05/18 0700 In: 4615.5 [I.V.:3966; NG/GT:530; IV Piggyback:119.5] Out: 2875 [Urine:2375; Emesis/NG output:500]  Intake/Output this shift: No intake/output data recorded.  Current Meds: Scheduled Meds: . amiodarone  200 mg Per Tube BID  . aspirin  324 mg Per Tube Daily  . bisacodyl  10 mg Rectal Daily  . chlorhexidine gluconate (MEDLINE KIT)  15 mL Mouth Rinse BID  . Chlorhexidine Gluconate Cloth  6 each Topical Daily  . feeding supplement (PIVOT 1.5 CAL)  1,000 mL Per Tube Q24H  . feeding supplement (PROSource TF)  45 mL Per Tube BID  . free water  100 mL Per Tube Q4H  . heparin injection (subcutaneous)  5,000 Units Subcutaneous Q8H  . hydrALAZINE  25 mg Per Tube Q8H  . insulin aspart  0-24 Units Subcutaneous Q4H  . insulin glargine  8 Units Subcutaneous BID  . ipratropium-albuterol  3 mL  Nebulization Q6H  . isosorbide dinitrate  10 mg Per Tube TID  . mouth rinse  15 mL Mouth Rinse 10 times per day  . metoCLOPramide (REGLAN) injection  5 mg Intravenous Q6H  . neostigmine  0.25 mg Subcutaneous Q6H  . pantoprazole (PROTONIX) IV  40 mg Intravenous Q24H  . polyethylene glycol  17 g Per Tube BID  . sodium chloride flush  10-40 mL Intracatheter Q12H  . sodium chloride flush  3 mL Intravenous Q12H   Continuous Infusions: . dexmedetomidine (PRECEDEX) IV infusion 0.7 mcg/kg/hr (10/05/20 0600)  . dextrose 75 mL/hr at 10/05/20 0600  . fentaNYL infusion INTRAVENOUS Stopped (10/01/20 0024)  . lactated ringers 10 mL/hr at 10/01/20 1900  . lactated ringers Stopped (10/01/20 1497)  . milrinone 0.125 mcg/kg/min (10/05/20 0600)  . TPN ADULT (ION) 95 mL/hr at 10/05/20 0600   PRN Meds:.acetaminophen (TYLENOL) oral liquid 160 mg/5 mL, albuterol, fentaNYL (SUBLIMAZE) injection, lip balm, metoprolol tartrate, midazolam, ondansetron (ZOFRAN) IV, sodium chloride flush, sodium chloride flush, sorbitol  Heart: Tachycardic Lungs: Clear breath sounds Abdomen: Soft, fairly large ventral hernia, occasional bowel sounds Extremities: No LE edema Wounds:Clean and dry  Lab Results: CBC: Recent Labs    10/04/20 0444 10/05/20 0350  WBC 9.0 8.5  HGB 7.9* 7.2*  HCT 26.9* 23.4*  PLT  196 177   BMET:  Recent Labs    10/04/20 1356 10/05/20 0503  NA 151* 144  K 4.9 4.5  CL 120* 116*  CO2 23 21*  GLUCOSE 212* 305*  BUN 123* 121*  CREATININE 2.71* 2.70*  CALCIUM 7.8* 7.5*    CMET: Lab Results  Component Value Date   WBC 8.5 10/05/2020   HGB 7.2 (L) 10/05/2020   HCT 23.4 (L) 10/05/2020   PLT 177 10/05/2020   GLUCOSE 305 (H) 10/05/2020   CHOL 65 09/25/2020   TRIG 162 (H) 10/03/2020   HDL 13 (L) 09/25/2020   LDLCALC 28 09/25/2020   ALT 24 10/04/2020   AST 38 10/04/2020   NA 144 10/05/2020   K 4.5 10/05/2020   CL 116 (H) 10/05/2020   CREATININE 2.70 (H) 10/05/2020   BUN 121 (H)  10/05/2020   CO2 21 (L) 10/05/2020   INR 1.5 (H) 09/23/2020   HGBA1C 6.3 (H) 09/25/2020    PT/INR: No results for input(s): LABPROT, INR in the last 72 hours. Radiology: DG CHEST PORT 1 VIEW  Result Date: 10/04/2020 CLINICAL DATA:  Endotracheal tube placement. EXAM: PORTABLE CHEST 1 VIEW COMPARISON:  10/02/2020 FINDINGS: Endotracheal tube is in place with tip approximately 4 centimeters above the carina. LEFT-sided PICC line tip overlies the superior vena cava. Of feeding tube and nasogastric tube replacement tips beyond the edge of the image. The heart size is normal. Patchy interstitial infiltrates are again identified throughout the lungs, LEFT greater than RIGHT. Postop changes in the RIGHT shoulder. IMPRESSION: Stable appearance of the chest. Persistent bilateral infiltrates LEFT greater than RIGHT. Electronically Signed   By: Nolon Nations M.D.   On: 10/04/2020 14:51     Assessment/Plan: S/P Procedure(s) (LRB): CORONARY ARTERY BYPASS GRAFTING (CABG)  TIMES 4 USING LEFT GREATER SAPHENOUS VEIN HARVESTED ENDOSCOPICALLY AND LEFT INTERNAL MAMMARY ARTERY (N/A) TRANSESOPHAGEAL ECHOCARDIOGRAM (TEE) (N/A) 1. CV-S/p NSTEMI. On ec asa and per HF, likely to add Plavix soon. Also, on Isordil 10 mg tid, Hydralazine 25 mg tid, Milrinone drip. Co ox this am decreased to 65.2 2. Pulmonary-Acute hypoxic respiratory failure, pulmonary edema. On vent. Per pulmonary/CCM. Will check CXR in am. Continue Duo Nebs 3. AKI-creatinine this am 2.7. Likely needs nephrology consult and possible CVVH 4. Expected post op blood loss anemia-H and H this am decreased to 7.2 and 23.4 5. CBGs 209/180/145. Pre op HGA1C 6.3. 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 Will continue accu checks as on TFs 6. 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 GI-NPO, TFs. Continue Neostigmine and Reglan. Has NGT and ventral hernia.  Donielle Liston Alba PA-C 10/05/2020 8:10 AM   Agree with above. Continues to have  significant NG tube output, however he had 2 bowel movements yesterday. His sedation has been changed and Klonopin has been added, and he appears to be more comfortable on the ventilator. Will assess need for tracheostomy.  Shahla Betsill Bary Leriche

## 2020-10-05 NOTE — Progress Notes (Deleted)
NAME:  Jack Vance, MRN:  [id number], DOB:  April 21, 1941, LOS: 12 ADMISSION DATE:  09/23/2020, CONSULTATION DATE:  09/24/2019 REFERRING MD:  Jacinto Halim, CHIEF COMPLAINT:  Acute coronary syndrome   History of Present Illness:  80 year old man who presented with cardiogenic shock secondary to NSTEMI.  Presented with new onset RSCP and severe dyspnea. Found to have acute pulmonary edema and heart failure; intubated and sent for urgent LHC which revealed 3V CAD.  IABP inserted and patient underwent urgent CABG. IABP removed 5/9.  Pertinent  Medical History   Past Medical History:  Diagnosis Date  . Arthritis   . Asthma   . DDD (degenerative disc disease), lumbar   . Hernia of abdominal wall   . RLS (restless legs syndrome)    Significant Hospital Events: Including procedures, antibiotic start and stop dates in addition to other pertinent events   . 5/6 CABG with Dr Cliffton Asters with LIMA to LAD, SVG to RCA d and SVG to ramus. LV normal and returned to ICU with no vasopressor support.  . No post-operative bleeding but poor RV performance on hemodynamics.  . 5/8 significant hemodynamic instability with labile blood pressure and vasopressor requirements yesterday.  Episode of emesis but no evidence of bowel obstruction.  Evidence of pericardial tamponade. . 5/9 improved hemodynamic, able now to tolerate balloon pump weaning and removal. . 5/11 Stable hemodynamics, continuing diuresis. Improved UOP. TPN consult for initiation 5/12 in the setting of ileus, prolonged NPO status.  . 5/13 sedated, diuresis, milrinone, PICC line today, CVL removed  . 5/14 tolerated short SBT/SAT on Precedex . 5/16 Not tolerating SBT due to significant tachypnea to 50s, placed back on PRVC. Marland Kitchen 5/17 Weaning milrinone, minimizing sedation. Bronchoscopy and ETT exchange.  Interim History / Subjective:  Episodes of agitation last night requiring PRN fentanyl and versed Not tolerating SBT yesterday S/P bronchoscopy and ETT  exchange yesterday Remains tachypneic to 30s Not following commands today on minimal sedation  Objective   Blood pressure (!) 120/56, pulse 95, temperature (!) 97.1 F (36.2 C), temperature source Axillary, resp. rate (!) 30, height 5\' 6"  (1.676 m), weight 77.6 kg, SpO2 100 %. CVP:  [3 mmHg-7 mmHg] 7 mmHg  Vent Mode: PRVC FiO2 (%):  [40 %-60 %] 40 % Set Rate:  [2 bmp-22 bmp] 22 bmp Vt Set:  [560 mL] 560 mL PEEP:  [5 cmH20] 5 cmH20 Pressure Support:  [20 cmH20] 20 cmH20 Plateau Pressure:  [19 cmH20-25 cmH20] 23 cmH20   Intake/Output Summary (Last 24 hours) at 10/05/2020 1148 Last data filed at 10/05/2020 1000 Gross per 24 hour  Intake 5388.62 ml  Output 2830 ml  Net 2558.62 ml   Filed Weights   10/04/20 0523 10/04/20 0800 10/05/20 0437  Weight: (P) 77.1 kg 75.7 kg 77.6 kg   Physical Examination: General: Chronically ill-appearing male in NAD. Appears uncomfortable. HEENT: [place]/AT, anicteric sclera, PERRL, moist mucous membranes. ETT/OGT/Cortrak in place. Neuro: Lethargic. Withdraws to pain in all 4 extremities. Not following commands. Moves all 4 extremities spontaneously. Opens eyes to voice. CV: Normal S1 and S2, irregularly irregular rhythm. No M/R/G appreciated. PULM: Tachypneic, mildly labored breathing, abdominal accessory muscle use on vent (PRVC 5, 20, FiO2 40%, 560). Upper airway noises bilaterally. GI: Soft, nontender, mild-moderately distended. Normoactive bowel sounds. Large ventral hernia soft/compressible. Extremities: Negative BLE edema. Skin: Warm/dry, no rashes.  Labs/imaging that I have personally reviewed: (right click and "Reselect all SmartList Selections" daily)  WBC 8.5 (12.1), H&H 7.2/23.4 (8.2/26.9), Plt 177 (151)  Na  144 (147), K 4.5 (3.7), CO2 21 (29), BUN 121 (100), Cr 2.7 (2.48), peak 2.9 this admission  Glucoses 145-209 last 24H  Co-ox 75.2 (67.1) %  Resolved Hospital Problem List   NSTEMI, Thrombocytopenia  Assessment & Plan:   Acute  hypoxemic hypercapnic respiratory failure requiring intubation mechanical ventilation Possible aspiration - Continue full vent support (4-8cc/kg IBW) - Wean FiO2 for O2 sat > 90% - Daily WUA/SBT - VAP bundle - Pulmonary hygiene - PAD protocol for sedation: Precedex for goal RASS 0 to -1, minimizing as able  - Start Seroquel and klonopin to help with sedation status; restarting home Requip for RLS - Extubation continues to be precluded by weakness evidenced by increased WOB/tachypnea and mental status with continued need for sedation for vent tolerance - Will need to discuss trach in the upcoming days/weeks if unable to tolerate weaning - S/p Zosyn course  Critically ill due to RV dysfunction following ACS and cardiopulmonary bypass  Cardiogenic shock NSTEMI with culprit RCA involvement Coronary artery disease - Appreciate management of TCTS/HF teams - Diuresis per primary team - Remains on amiodarone - On milrinone to 0.125 today  Acute kidney injury Likely multifactorial; favor uremic given BUN 121 complicated by prerenal in the setting of hemodynamic insults, cardiogenic shock, aggressive diuresis for volume management, medications. Baseline Cr ~1.0. - Nephrology consulted for HD given concern for uremia; appreciate recs - Trend BMP - Replete electrolytes as indicated - Monitor I&Os - Avoid nephrotoxic agents as able - Ensure adequate renal perfusion  Gastric ileus, improving Tolerating trickle TFs. 1x BM overnight - Keep TF rate at 47mL/hr via Cortrak - Continue OGT - Continue TPN - Bowel regimen as appropriate  Hypernatremia Improving; Na 151->144 - Decrease FWF - Continue D5 - Continue to monitor  Fever Patient with fever overnight likely due to bronchoscopy yesterday - Continue to trend fever curve - S/P Zosyn course - Would consider abx if patient continues to fever  DM with hyperglycemia - SSI and CBGs - Continue Lantus - Goal BG 140-180  Agitated  delirium controlled Alcohol abuse - Delirium Precautions  Best Practice: (right click and "Reselect all SmartList Selections" daily)  Diet:  Tube Feed  + TPN Pain/Anxiety/Delirium protocol (if indicated): Yes (RASS goal 0) to -1 VAP protocol (if indicated): Yes DVT prophylaxis: Subcutaneous Heparin  GI prophylaxis: PPI Glucose control:  SSI Yes Central venous access:  Yes, and it is still needed Arterial line:  Yes, and it is still needed Foley:  Yes, and it is still needed Mobility:  bed rest  PT consulted: N/A Last date of multidisciplinary goals of care discussion [Per Primary] Code Status:  full code Disposition: ICU  Critical care time: 30 minutes   Melvyn Neth, Medical Student

## 2020-10-06 ENCOUNTER — Inpatient Hospital Stay (HOSPITAL_COMMUNITY): Payer: No Typology Code available for payment source

## 2020-10-06 DIAGNOSIS — J9601 Acute respiratory failure with hypoxia: Secondary | ICD-10-CM | POA: Diagnosis not present

## 2020-10-06 DIAGNOSIS — N179 Acute kidney failure, unspecified: Secondary | ICD-10-CM | POA: Diagnosis not present

## 2020-10-06 DIAGNOSIS — I5021 Acute systolic (congestive) heart failure: Secondary | ICD-10-CM | POA: Diagnosis not present

## 2020-10-06 DIAGNOSIS — Z9911 Dependence on respirator [ventilator] status: Secondary | ICD-10-CM | POA: Diagnosis not present

## 2020-10-06 LAB — COMPREHENSIVE METABOLIC PANEL
ALT: 23 U/L (ref 0–44)
AST: 25 U/L (ref 15–41)
Albumin: 1.5 g/dL — ABNORMAL LOW (ref 3.5–5.0)
Alkaline Phosphatase: 46 U/L (ref 38–126)
Anion gap: 5 (ref 5–15)
BUN: 127 mg/dL — ABNORMAL HIGH (ref 8–23)
CO2: 22 mmol/L (ref 22–32)
Calcium: 7.3 mg/dL — ABNORMAL LOW (ref 8.9–10.3)
Chloride: 118 mmol/L — ABNORMAL HIGH (ref 98–111)
Creatinine, Ser: 2.51 mg/dL — ABNORMAL HIGH (ref 0.61–1.24)
GFR, Estimated: 26 mL/min — ABNORMAL LOW (ref >60)
Glucose, Bld: 227 mg/dL — ABNORMAL HIGH (ref 70–99)
Potassium: 3.9 mmol/L (ref 3.5–5.1)
Sodium: 145 mmol/L (ref 135–145)
Total Bilirubin: 0.7 mg/dL (ref 0.3–1.2)
Total Protein: 5 g/dL — ABNORMAL LOW (ref 6.5–8.1)

## 2020-10-06 LAB — CBC
HCT: 18.2 % — ABNORMAL LOW (ref 39.0–52.0)
HCT: 20 % — ABNORMAL LOW (ref 39.0–52.0)
Hemoglobin: 5.9 g/dL — CL (ref 13.0–17.0)
Hemoglobin: 6.1 g/dL — CL (ref 13.0–17.0)
MCH: 33.5 pg (ref 26.0–34.0)
MCH: 29.6 pg (ref 26.0–34.0)
MCHC: 32.4 g/dL (ref 30.0–36.0)
MCHC: 30.5 g/dL (ref 30.0–36.0)
MCV: 103.4 fL — ABNORMAL HIGH (ref 80.0–100.0)
MCV: 97.1 fL (ref 80.0–100.0)
Platelets: 155 10*3/uL (ref 150–400)
Platelets: 182 10*3/uL (ref 150–400)
RBC: 1.76 MIL/uL — ABNORMAL LOW (ref 4.22–5.81)
RBC: 2.06 MIL/uL — ABNORMAL LOW (ref 4.22–5.81)
RDW: 21 % — ABNORMAL HIGH (ref 11.5–15.5)
RDW: 19.9 % — ABNORMAL HIGH (ref 11.5–15.5)
WBC: 5.9 10*3/uL (ref 4.0–10.5)
WBC: 6.3 10*3/uL (ref 4.0–10.5)
nRBC: 0 % (ref 0.0–0.2)
nRBC: 0 % (ref 0.0–0.2)

## 2020-10-06 LAB — GLUCOSE, CAPILLARY
Glucose-Capillary: 131 mg/dL — ABNORMAL HIGH (ref 70–99)
Glucose-Capillary: 144 mg/dL — ABNORMAL HIGH (ref 70–99)
Glucose-Capillary: 155 mg/dL — ABNORMAL HIGH (ref 70–99)
Glucose-Capillary: 157 mg/dL — ABNORMAL HIGH (ref 70–99)
Glucose-Capillary: 165 mg/dL — ABNORMAL HIGH (ref 70–99)
Glucose-Capillary: 199 mg/dL — ABNORMAL HIGH (ref 70–99)

## 2020-10-06 LAB — UREA NITROGEN, URINE: Urea Nitrogen, Ur: 1043 mg/dL

## 2020-10-06 LAB — COOXEMETRY PANEL
Carboxyhemoglobin: 1.2 % (ref 0.5–1.5)
Methemoglobin: 1.9 % — ABNORMAL HIGH (ref 0.0–1.5)
O2 Saturation: 59.3 %
Total hemoglobin: 6.1 g/dL — CL (ref 12.0–16.0)

## 2020-10-06 LAB — PHOSPHORUS: Phosphorus: 3.2 mg/dL (ref 2.5–4.6)

## 2020-10-06 LAB — HEMOGLOBIN AND HEMATOCRIT, BLOOD
HCT: 25.6 % — ABNORMAL LOW (ref 39.0–52.0)
Hemoglobin: 8 g/dL — ABNORMAL LOW (ref 13.0–17.0)

## 2020-10-06 LAB — PREPARE RBC (CROSSMATCH)

## 2020-10-06 LAB — MAGNESIUM: Magnesium: 2.6 mg/dL — ABNORMAL HIGH (ref 1.7–2.4)

## 2020-10-06 MED ORDER — VALPROIC ACID 250 MG/5ML PO SOLN
250.0000 mg | Freq: Two times a day (BID) | ORAL | Status: DC
  Administered 2020-10-06 – 2020-10-13 (×16): 250 mg
  Filled 2020-10-06 (×16): qty 5

## 2020-10-06 MED ORDER — POTASSIUM CHLORIDE 10 MEQ/50ML IV SOLN
10.0000 meq | INTRAVENOUS | Status: AC
  Administered 2020-10-06 (×2): 10 meq via INTRAVENOUS
  Filled 2020-10-06 (×2): qty 50

## 2020-10-06 MED ORDER — PIVOT 1.5 CAL PO LIQD
1000.0000 mL | ORAL | Status: DC
  Administered 2020-10-06: 1000 mL

## 2020-10-06 MED ORDER — TRAVASOL 10 % IV SOLN
INTRAVENOUS | Status: AC
  Filled 2020-10-06: qty 1368

## 2020-10-06 MED ORDER — QUETIAPINE FUMARATE 50 MG PO TABS
50.0000 mg | ORAL_TABLET | Freq: Two times a day (BID) | ORAL | Status: DC
  Administered 2020-10-06 – 2020-10-08 (×5): 50 mg
  Filled 2020-10-06 (×5): qty 1

## 2020-10-06 MED ORDER — FREE WATER: 100.0000 mL | Status: DC

## 2020-10-06 MED ORDER — FREE WATER
150.0000 mL | Status: DC
  Administered 2020-10-06 – 2020-10-10 (×24): 150 mL

## 2020-10-06 MED ORDER — INSULIN ASPART 100 UNIT/ML IJ SOLN
6.0000 [IU] | INTRAMUSCULAR | Status: DC
  Administered 2020-10-06 – 2020-10-18 (×54): 6 [IU] via SUBCUTANEOUS

## 2020-10-06 NOTE — Progress Notes (Signed)
NAME:  Jack Vance, MRN:  [id number], DOB:  [birth year], LOS: 13 ADMISSION DATE:  09/23/2020, CONSULTATION DATE:  09/24/2019 REFERRING MD:  Jacinto Halim, CHIEF COMPLAINT:  Acute coronary syndrome   History of Present Illness:  80 year old man who presented with cardiogenic shock secondary to NSTEMI.  Presented with new onset RSCP and severe dyspnea. Found to have acute pulmonary edema and heart failure; intubated and sent for urgent LHC which revealed 3V CAD.  IABP inserted and patient underwent urgent CABG. IABP removed 5/9. Continues to require full mechanical ventilation support.  Pertinent  Medical History   Past Medical History:  Diagnosis Date  . Arthritis   . Asthma   . DDD (degenerative disc disease), lumbar   . Hernia of abdominal wall   . RLS (restless legs syndrome)    Significant Hospital Events: Including procedures, antibiotic start and stop dates in addition to other pertinent events   . 5/6 CABG with Dr Cliffton Asters with LIMA to LAD, SVG to RCA d and SVG to ramus. LV normal and returned to ICU with no vasopressor support.  . No post-operative bleeding but poor RV performance on hemodynamics.  . 5/8 significant hemodynamic instability with labile blood pressure and vasopressor requirements yesterday.  Episode of emesis but no evidence of bowel obstruction.  Evidence of pericardial tamponade. . 5/9 improved hemodynamic, able now to tolerate balloon pump weaning and removal. . 5/11 Stable hemodynamics, continuing diuresis. Improved UOP. TPN consult for initiation 5/12 in the setting of ileus, prolonged NPO status.  . 5/13 sedated, diuresis, milrinone, PICC line today, CVL removed  . 5/14 tolerated short SBT/SAT on Precedex . 5/16 Not tolerating SBT due to significant tachypnea to 50s, placed back on PRVC. Marland Kitchen 5/17 Improved SBT toleration, weaning milrinone, minimizing sedation. Bronched w/ c/f mucus plugging, airways clear, ETT exchanged due to significant buildup. Diuresis  held. . 5/18 More agitated overnight requiring increased sedation with addition of Fentanyl/Versed, Seroquel started. BUN continues to rise. Nephro consult.   Interim History / Subjective:  Still agitated.   Objective   Blood pressure (!) 117/50, pulse 95, temperature 99.3 F (37.4 C), temperature source Oral, resp. rate (!) 31, height 5\' 6"  (1.676 m), weight 78.7 kg, SpO2 98 %. CVP:  [1 mmHg-14 mmHg] 7 mmHg  Vent Mode: PRVC FiO2 (%):  [40 %] 40 % Set Rate:  [22 bmp] 22 bmp Vt Set:  [560 mL] 560 mL PEEP:  [5 cmH20] 5 cmH20 Plateau Pressure:  [20 cmH20-26 cmH20] 26 cmH20   Intake/Output Summary (Last 24 hours) at 10/06/2020 1101 Last data filed at 10/06/2020 1000 Gross per 24 hour  Intake 4967.6 ml  Output 2555 ml  Net 2412.6 ml   Filed Weights   10/04/20 0800 10/05/20 0437 10/06/20 0500  Weight: 75.7 kg 77.6 kg 78.7 kg   Physical Examination: General: chronically ill appearing man lying in bed in NAD HEENT: /AT, eyes anicteric, ETT in place Neuro: RASS -2, but moving L>R extremities spontaneously, mildly agitated but not pulling at lines currently. CV: reg rate, irreg rhythm PULM: tachypneic in any vent mode, clear ETT secretions, rhonchi cleared with suctioning GI: large ventral hernia, soft, NT, ND Extremities: minimal LE edema, no cyanosis or clubbing Skin: pallor, no rashes  Labs/imaging that I have personally reviewed: (right click and "Reselect all SmartList Selections" daily)   Cl 118 BUN 127 Creatinine 2.51 Phos 3.2 Magnesium 2.6 albumin 1.5 WBC H/H 6.1/20 Coox 59%  Resolved Hospital Problem List   NSTEMI, Thrombocytopenia  Assessment &  Plan:   Acute hypoxemic hypercapnic respiratory failure requiring mechanical ventilation. Prolonged vent weaning due to NM weakness. Possible aspiration -Con't LTVV, 4-8cc/kg IBW, goal Pplat<30 and DP<15 -VAP prevention protocol -PAD protocol for sedation. Adding valproic acid, increasing seroquel, con't klonopin. -  Wean FiO2 for O2 sat > 90% - Failed SBT again today; con't daily. Likely will need trach - monitoring off antibiotics  Critically ill due to RV dysfunction following ACS and cardiopulmonary bypass  Cardiogenic shock NSTEMI with culprit RCA involvement Coronary artery disease - Appreciate management of TCTS/HF teams. Con't milrinone. -Con't enteral amiodarone  Acute kidney injury Uremia Hypernatremia Likely multifactorial; favor primarily prerenal in the setting of hemodynamic insults, cardiogenic shock, aggressive diuresis for volume management, medications. Baseline Cr ~1.0.  -serial BMPs -appreciate nephrology's assistance -strict I/Os -renally dose meds and avoid nephrotoxic meds  Gastric ileus, improved Increasing abdominal distention and hypoactive bowel sounds. Tolerating trickle TFs. - Increase TF to 30cc/h, working on decreasing TPN volume. Agree with considering lower protein content. - Continue TPN - Bowel regimen  Agitated delirium controlled H/o alcohol abuse - Delirium precautions - Seroquel, klonopin, valproate -supplemental vitamins via TPN  Best Practice: (right click and "Reselect all SmartList Selections" daily)  Diet:  Tube Feed  + TPN Pain/Anxiety/Delirium protocol (if indicated): Yes (RASS goal 0) to -1 VAP protocol (if indicated): Yes DVT prophylaxis: Subcutaneous Heparin  GI prophylaxis: PPI Glucose control:  SSI Yes Central venous access:  Yes, and it is still needed Arterial line:  Yes, and it is still needed Foley:  Yes, and it is still needed Mobility:  bed rest  PT consulted: N/A Last date of multidisciplinary goals of care discussion [Per Primary] Code Status:  full code Disposition: ICU  Critical care time: 40 min.    Steffanie Dunn, DO 10/06/20 11:36 AM [facility]

## 2020-10-06 NOTE — Progress Notes (Signed)
Nutrition Follow Up  DOCUMENTATION CODES:   Not applicable  INTERVENTION:   Continue TPN to meet 100% of needs until enteral nutrition can be further advanced and tolerance is established   Continue tube feeding  -Pivot 1.5 @ 30 ml/hr via Cortrak -Increase per tolerance to goal rate of 60 ml/hr (1440 ml) -Free water 150 ml Q4 hours   Goal rate provides: 2160 kcals, 135 grams protein, 1093 ml free water (1993 ml with flushes)  NUTRITION DIAGNOSIS:   Increased nutrient needs related to post-op healing as evidenced by estimated needs.  Ongoing  GOAL:   Patient will meet greater than or equal to 90% of their needs   Meeting with TPN  MONITOR:   Vent status,Labs,I & O's,Skin,TF tolerance,Weight trends  REASON FOR ASSESSMENT:   Consult Enteral/tube feeding initiation and management  ASSESSMENT:   Patient with PMH significant for DDD, asthma, HTN, and large ventral hernia. Presented to [facility] ED with shortness of breath, was found to be in cardiogenic shock with NSTEMI.   5/06- s/p heart cath, confirmed critical CAD, s/p emergent CABG x3 wih IABP placement 5/08- large vomiting episode, TF held 5/09- s/p IABP removed, trickle TF started  5/10- large vomiting episode, TF held 5/11- Cortrak tube advanced to ligament of Treitz 5/12- TPN started  5/14- Pivot 1.5 restarted at 10 ml/hr  5/16- Pivot 1.5 advanced to 20 ml/hr 5/17- s/p bronch showed c/f mucus plugging, ETT exchanged    Pt discussed during ICU rounds and with RN.   Remains agitated. Unable to tolerate SBT, will likely require trach. Hypernatremia improved with increased free water.   TPN running at 95 ml/hr to provide 137 grams of protein and 2178 kcal. Meets 100% of needs. Noted nephrology's concern for protein content in TPN given ongoing azotemia. This is likely multifactorial given intravascular volume depletion and possible GI bleed. Patient is receiving additional protein from tube feeding but given patient's  inability to demonstrate tolerance of tube feeding past trickle rate and potential for malabsorption, would not decrease protein in TPN at this time. Patient with elevated protein needs given new skin breakdown and post-op nutrition set backs. Will continue to assess and adjust recommendations accordingly.   Pivot 1.5 advanced to 30 ml/hr per CCM. Patient had large BM yesterday and abdomen is less firm today. If patient requires trach and long term feeding access, placement of feeding tube may be difficult given large ventral hernia.   Admission weight: 83 kg  Current weight: 78.7 kg (stable over the week)  Patient remains intubated on ventilator support MV: 18.9 L/min Temp (24hrs), Avg:99 F (37.2 C), Min:97.9 F (36.6 C), Max:100.5 F (38.1 C)   UOP: 1890 ml x 24 hrs  NG: 850 ml x 24 hrs   Drips: precedex Medications: dulcolax, SS novolog, lantus, 5 mg reglan QID, miralax Labs: Mg 2.6 (H) Cr 2.51- down from yesterday BUN 127- slightly up from yesterday CBG 154-305  Diet Order:   Diet Order    None      EDUCATION NEEDS:   Not appropriate for education at this time  Skin:  Skin Assessment: Skin Integrity Issues: Skin Integrity Issues:: Stage II: buttocks  Incisions: bilateral legs, chest  Last BM:  5/18- large liquid  Height:   Ht Readings from Last 1 Encounters:  09/23/20 5\' 6"  (1.676 m)    Weight:   Wt Readings from Last 1 Encounters:  10/06/20 78.7 kg   BMI:  Body mass index is 28 kg/m.  Estimated Nutritional  Needs:   Kcal:  8416-6063 kcal  Protein:  135-180 grams  Fluid:  >/= 2 L/day  Vanessa Kick RD, LDN Clinical Nutrition Pager listed in AMION

## 2020-10-06 NOTE — Progress Notes (Signed)
[facility] Progress Note    Assessment/ Plan:   1. AKI, prerenal azotemia on the setting of cardiogenic shock 2. At risk for ATN  Minimally overnight from 2.70 to 2.51.  Bicarb and anion gap stable from yesterday.  Hypernatremia improving with increased free water pushes and decreasing normal saline.  Otherwise no significant electrolyte abnormalities. He continues to make urine with 2.1 L output yesterday.  Patient's clinical picture suggest intravascular volume depletion which is consistent with urine sodium less than 10 consistent with prerenal azotemia.  Patient also has significant hypoalbuminemia of 1.5 which is likely contributing to this lack of intravascular volume.  Considering his drop in hemoglobin he would benefit from blood transfusion as this would help his intravascular oncotic pressure as well.  BUN has remained stable and elevated at 127.  This is likely secondary to to a combination of high-protein diet with TPN, prerenal azotemia, and possible GI bleed.  Considering patient's tenuous cardiovascular status, he is high risk for ATN.  Currently no emergent needs for hemodialysis. -Continue pressors as needed to maintain maps greater than 65 -Consider switching milrinone to alternative agent in setting of AKI. -Avoid nephrotoxic medications including ACE/ARB -Continue PPI and consider further work-up for upper GI bleed -consider decreasing protein load and TPN as possible -We will monitor kidney function, electrolytes and urine output closely.  2.  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  Acute systolic heart failure complicated by cardiogenic shock: -Continue pressors monitor Co-ox -Continue with guideline directed medical therapy and agree with avoiding ACE/ARB's and beta-blockers.  4.  Acute respiratory failure secondary to possible aspiration pneumonia - Continue Zosyn for  possible aspiration pneumonia - Continue vent management per PCCM  Subjective:   Patient resting in bed with ET tube in place.  Patient unable to participate with interview.   Objective:   BP (!) 105/50   Pulse 95   Temp 99.3 F (37.4 C) (Oral)   Resp (!) 29   Ht $R'5\' 6"'Al$  (1.676 m)   Wt 78.7 kg   SpO2 99%   BMI 28.00 kg/m   Intake/Output Summary (Last 24 hours) at 10/06/2020 0950 Last data filed at 10/06/2020 0600 Gross per 24 hour  Intake 5113.09 ml  Output 2565 ml  Net 2548.09 ml   Weight change: 3 kg  Physical Exam: Physical Exam Constitutional:      Appearance: He is ill-appearing and diaphoretic.  HENT:     Head: Normocephalic and atraumatic.     Mouth/Throat:     Comments: ET tube in place with minimal perioral dried secretion  Cardiovascular:     Rate and Rhythm: Normal rate and regular rhythm.     Pulses: Normal pulses.     Heart sounds: Murmur heard.    Pulmonary:     Breath sounds: Rales (bilateral) present.     Comments: Course upper respiratory sounds Abdominal:     General: Bowel sounds are normal. There is distension.     Palpations: Abdomen is soft.     Hernia: A hernia is present.  Musculoskeletal:        General: Swelling (upper and lower extremity pitting edema) present.  Skin:    General: Skin is warm.      Imaging: DG CHEST PORT 1 VIEW  Result Date: 10/06/2020 CLINICAL DATA:  Intubation. EXAM: PORTABLE CHEST 1 VIEW COMPARISON:  10/04/2020.  10/02/2020.  01/08/2006. FINDINGS: Endotracheal tube, NG tube, left PICC  line in stable position. Prior CABG. Heart size stable. Bilateral mild interstitial prominence suggesting interstitial edema again noted. Tiny bilateral pleural effusions again noted. No pneumothorax. Postsurgical changes right humerus. IMPRESSION: 1.  Lines and tubes in stable position. 2.  Prior CABG.  Heart size stable. 3. Bilateral mild interstitial prominence suggesting interstitial edema again noted. No interim change. Tiny  bilateral pleural effusions again noted without interim change. Electronically Signed   By: Marcello Moores  Register   On: 10/06/2020 06:12   DG CHEST PORT 1 VIEW  Result Date: 10/04/2020 CLINICAL DATA:  Endotracheal tube placement. EXAM: PORTABLE CHEST 1 VIEW COMPARISON:  10/02/2020 FINDINGS: Endotracheal tube is in place with tip approximately 4 centimeters above the carina. LEFT-sided PICC line tip overlies the superior vena cava. Of feeding tube and nasogastric tube replacement tips beyond the edge of the image. The heart size is normal. Patchy interstitial infiltrates are again identified throughout the lungs, LEFT greater than RIGHT. Postop changes in the RIGHT shoulder. IMPRESSION: Stable appearance of the chest. Persistent bilateral infiltrates LEFT greater than RIGHT. Electronically Signed   By: Nolon Nations M.D.   On: 10/04/2020 14:51    Labs: BMET Recent Labs  Lab 09/30/20 0092 09/30/20 1556 10/01/20 1307 10/02/20 0355 10/03/20 0424 10/04/20 0741 10/04/20 1356 10/05/20 0503 10/06/20 0456  NA 144   < > 143 145 147* 152* 151* 144 145  K 2.4*   < > 3.4* 3.6 3.7 4.7 4.9 4.5 3.9  CL 102   < > 100 105 112* 121* 120* 116* 118*  CO2 29   < > 34* $Rem'30 29 25 23 'xVJO$ 21* 22  GLUCOSE 195*   < > 174* 181* 160* 176* 212* 305* 227*  BUN 80*   < > 85* 88* 100* 115* 123* 121* 127*  CREATININE 2.92*   < > 2.58* 2.37* 2.48* 2.70* 2.71* 2.70* 2.51*  CALCIUM 8.4*   < > 8.5* 8.7* 8.5* 8.1* 7.8* 7.5* 7.3*  PHOS 4.4  --   --   --  1.5*  --   --   --  3.2   < > = values in this interval not displayed.   CBC Recent Labs  Lab 09/30/20 0337 10/01/20 0319 10/03/20 0424 10/04/20 0444 10/05/20 0350 10/06/20 0320 10/06/20 0434  WBC 11.0*   < > 12.1* 9.0 8.5 5.9 6.3  NEUTROABS 9.8*  --  9.5*  --   --   --   --   HGB 9.2*   < > 8.2* 7.9* 7.2* 5.9* 6.1*  HCT 28.2*   < > 26.9* 26.9* 23.4* 18.2* 20.0*  MCV 81.5   < > 87.1 100.4* 109.9* 103.4* 97.1  PLT 90*   < > 181 196 177 155 182   < > = values in this  interval not displayed.    Medications:    . amiodarone  200 mg Per Tube BID  . aspirin  324 mg Per Tube Daily  . bisacodyl  10 mg Rectal Daily  . chlorhexidine gluconate (MEDLINE KIT)  15 mL Mouth Rinse BID  . Chlorhexidine Gluconate Cloth  6 each Topical Daily  . clonazePAM  1 mg Per Tube BID  . feeding supplement (PIVOT 1.5 CAL)  1,000 mL Per Tube Q24H  . feeding supplement (PROSource TF)  45 mL Per Tube BID  . free water  150 mL Per Tube Q4H  . hydrALAZINE  25 mg Per Tube Q8H  . insulin aspart  0-24 Units Subcutaneous Q4H  . insulin  aspart  6 Units Subcutaneous Q4H  . insulin glargine  8 Units Subcutaneous BID  . ipratropium-albuterol  3 mL Nebulization Q6H  . isosorbide dinitrate  10 mg Per Tube TID  . mouth rinse  15 mL Mouth Rinse 10 times per day  . metoCLOPramide (REGLAN) injection  5 mg Intravenous Q6H  . pantoprazole (PROTONIX) IV  40 mg Intravenous Q24H  . polyethylene glycol  17 g Per Tube BID  . QUEtiapine  50 mg Per Tube BID  . rOPINIRole  3 mg Per Tube TID  . sodium chloride flush  10-40 mL Intracatheter Q12H  . sodium chloride flush  3 mL Intravenous Q12H  . valproic acid  250 mg Per Tube BID    Lawerance Cruel, D.O.  Internal Medicine Resident, PGY-2 [facility]  Pager: [phone] 9:50 AM, 10/06/2020

## 2020-10-06 NOTE — Progress Notes (Signed)
Patient taken to CT & back to room 2H07 on the ventilator with no problems.

## 2020-10-06 NOTE — Progress Notes (Addendum)
Advanced Heart Failure Rounding Note   Subjective:    Underwent emergent CABG on 5/6 for critical LM disease (LIMA -> LAD, SVG -> OM, SVG -> RCA) 5/6 Cultures negative.  5/8 Possible aspiration. A fib --> started amio drip.  Given 2u PRBCs. Hypotensive in the evening and started on vasopressin + antibiotics.  5/9 IABP removed 5/10 NG placed >1.5 out  5/11 started neostigmime 5/12 Febrile 102 5/17 Failed SBT. Underwent bronchoscopy for attempted therapeutic aspiration for mucus plugging. ETT exchanged.    Remains on vent. FiO2 40%.  Sedated.    Remains on milrinone 0.125. Norepi off. Co-ox 59%   Hgb low at 6.1   Diuretics held yesterday w/ low CVP. Wt trending up, CVP now 9. SCr trending down, 2.7>>2.5. BUN remains elevated at 127. Nephrology following. 1.9L in UOP yesterday.   AFebrile overnight. WBC 6.3     Objective:   Weight Range:  Vital Signs:   Temp:  [97.1 F (36.2 C)-100.9 F (38.3 C)] 99.3 F (37.4 C) (05/19 0737) Resp:  [19-37] 29 (05/19 0600) BP: (94-139)/(43-74) 105/50 (05/19 0600) SpO2:  [97 %-100 %] 97 % (05/19 0600) FiO2 (%):  [40 %] 40 % (05/19 0249) Weight:  [78.7 kg] 78.7 kg (05/19 0500) Last BM Date: 10/05/20  Weight change: Filed Weights   10/04/20 0800 10/05/20 0437 10/06/20 0500  Weight: 75.7 kg 77.6 kg 78.7 kg    Intake/Output:   Intake/Output Summary (Last 24 hours) at 10/06/2020 0743 Last data filed at 10/06/2020 0600 Gross per 24 hour  Intake 5479.01 ml  Output 2740 ml  Net 2739.01 ml     PHYSICAL EXAM: CVP 9   General:  Critically ill WM, sedated on vent  HEENT: + ETT normal Neck: supple. JVD 8 cm. Carotids 2+ bilat; no bruits. No lymphadenopathy or thyromegaly appreciated. Cor: PMI nondisplaced. RRR. No rubs, gallops or murmurs. Lungs: intubated and course Abdomen: soft, nontender, nondistended. No hepatosplenomegaly. No bruits + large ventral hernia, reducible  Good bowel sounds. Extremities: no cyanosis, clubbing,  rash, edema Neuro: intubated and sedated    Telemetry: NSR 80s-90s Personally reviewed  Labs: Basic Metabolic Panel: Recent Labs  Lab 09/30/20 0337 09/30/20 1556 10/01/20 1307 10/02/20 0355 10/03/20 0424 10/04/20 0741 10/04/20 1356 10/05/20 0503 10/06/20 0456  NA 144   < > 143   < > 147* 152* 151* 144 145  K 2.4*   < > 3.4*   < > 3.7 4.7 4.9 4.5 3.9  CL 102   < > 100   < > 112* 121* 120* 116* 118*  CO2 29   < > 34*   < > $R'29 25 23 '[place]$ 21* 22  GLUCOSE 195*   < > 174*   < > 160* 176* 212* 305* 227*  BUN 80*   < > 85*   < > 100* 115* 123* 121* 127*  CREATININE 2.92*   < > 2.58*   < > 2.48* 2.70* 2.71* 2.70* 2.51*  CALCIUM 8.4*   < > 8.5*   < > 8.5* 8.1* 7.8* 7.5* 7.3*  MG 2.4  --  2.1  --  2.2  --   --   --  2.6*  PHOS 4.4  --   --   --  1.5*  --   --   --  3.2   < > = values in this interval not displayed.    Liver Function Tests: Recent Labs  Lab 09/30/20 9371 10/03/20 0424 10/04/20 0741 10/06/20 0456  AST 29 42* 38 25  ALT $Re'7 17 24 23  'rQJ$ ALKPHOS 117 72 65 46  BILITOT 0.8 1.0 1.1 0.7  PROT 5.3* 5.4* 6.1* 5.0*  ALBUMIN 1.6* 1.7* 1.9* 1.5*   No results for input(s): LIPASE, AMYLASE in the last 168 hours. No results for input(s): AMMONIA in the last 168 hours.  CBC: Recent Labs  Lab 09/30/20 0337 10/01/20 0319 10/03/20 0424 10/04/20 0444 10/05/20 0350 10/06/20 0320 10/06/20 0434  WBC 11.0*   < > 12.1* 9.0 8.5 5.9 6.3  NEUTROABS 9.8*  --  9.5*  --   --   --   --   HGB 9.2*   < > 8.2* 7.9* 7.2* 5.9* 6.1*  HCT 28.2*   < > 26.9* 26.9* 23.4* 18.2* 20.0*  MCV 81.5   < > 87.1 100.4* 109.9* 103.4* 97.1  PLT 90*   < > 181 196 177 155 182   < > = values in this interval not displayed.    Cardiac Enzymes: No results for input(s): CKTOTAL, CKMB, CKMBINDEX, TROPONINI in the last 168 hours.  BNP: BNP (last 3 results) Recent Labs    09/23/20 0311  BNP 1,191.8*    ProBNP (last 3 results) No results for input(s): PROBNP in the last 8760 hours.    Other  results:  Imaging: DG CHEST PORT 1 VIEW  Result Date: 10/06/2020 CLINICAL DATA:  Intubation. EXAM: PORTABLE CHEST 1 VIEW COMPARISON:  10/04/2020.  10/02/2020.  01/08/2006. FINDINGS: Endotracheal tube, NG tube, left PICC line in stable position. Prior CABG. Heart size stable. Bilateral mild interstitial prominence suggesting interstitial edema again noted. Tiny bilateral pleural effusions again noted. No pneumothorax. Postsurgical changes right humerus. IMPRESSION: 1.  Lines and tubes in stable position. 2.  Prior CABG.  Heart size stable. 3. Bilateral mild interstitial prominence suggesting interstitial edema again noted. No interim change. Tiny bilateral pleural effusions again noted without interim change. Electronically Signed   By: Marcello Moores  Register   On: 10/06/2020 06:12   DG CHEST PORT 1 VIEW  Result Date: 10/04/2020 CLINICAL DATA:  Endotracheal tube placement. EXAM: PORTABLE CHEST 1 VIEW COMPARISON:  10/02/2020 FINDINGS: Endotracheal tube is in place with tip approximately 4 centimeters above the carina. LEFT-sided PICC line tip overlies the superior vena cava. Of feeding tube and nasogastric tube replacement tips beyond the edge of the image. The heart size is normal. Patchy interstitial infiltrates are again identified throughout the lungs, LEFT greater than RIGHT. Postop changes in the RIGHT shoulder. IMPRESSION: Stable appearance of the chest. Persistent bilateral infiltrates LEFT greater than RIGHT. Electronically Signed   By: Nolon Nations M.D.   On: 10/04/2020 14:51     Medications:     Scheduled Medications: . amiodarone  200 mg Per Tube BID  . aspirin  324 mg Per Tube Daily  . bisacodyl  10 mg Rectal Daily  . chlorhexidine gluconate (MEDLINE KIT)  15 mL Mouth Rinse BID  . Chlorhexidine Gluconate Cloth  6 each Topical Daily  . clonazePAM  1 mg Per Tube BID  . feeding supplement (PIVOT 1.5 CAL)  1,000 mL Per Tube Q24H  . feeding supplement (PROSource TF)  45 mL Per Tube BID   . free water  150 mL Per Tube Q4H  . heparin injection (subcutaneous)  5,000 Units Subcutaneous Q8H  . hydrALAZINE  25 mg Per Tube Q8H  . insulin aspart  0-24 Units Subcutaneous Q4H  . insulin aspart  4 Units Subcutaneous Q4H  . insulin glargine  8  Units Subcutaneous BID  . ipratropium-albuterol  3 mL Nebulization Q6H  . isosorbide dinitrate  10 mg Per Tube TID  . mouth rinse  15 mL Mouth Rinse 10 times per day  . metoCLOPramide (REGLAN) injection  5 mg Intravenous Q6H  . neostigmine  0.25 mg Subcutaneous Q6H  . pantoprazole (PROTONIX) IV  40 mg Intravenous Q24H  . polyethylene glycol  17 g Per Tube BID  . QUEtiapine  25 mg Per Tube BID  . rOPINIRole  3 mg Per Tube TID  . sodium chloride flush  10-40 mL Intracatheter Q12H  . sodium chloride flush  3 mL Intravenous Q12H    Infusions: . dexmedetomidine (PRECEDEX) IV infusion 0.3 mcg/kg/hr (10/06/20 0700)  . fentaNYL infusion INTRAVENOUS Stopped (10/01/20 0024)  . lactated ringers 10 mL/hr at 10/01/20 1900  . lactated ringers Stopped (10/01/20 3810)  . milrinone 0.125 mcg/kg/min (10/06/20 0500)  . TPN ADULT (ION) 95 mL/hr at 10/06/20 0500    PRN Medications: acetaminophen (TYLENOL) oral liquid 160 mg/5 mL, albuterol, fentaNYL (SUBLIMAZE) injection, lip balm, metoprolol tartrate, midazolam, ondansetron (ZOFRAN) IV, sodium chloride flush, sodium chloride flush, sorbitol   Assessment/Plan:   1. 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  - Co-ox 59% on milrinone 0.125. Off NE - Wt and volume status trending up off diuretics, CVP 9 today. BUN 127.   - No ACE/ARB/ARNI/MRA with AKI. No b-blocker yet with shock.  - c/w hydral/nitrates for afterload reduction  - diuretics per nephrology   2. NSTEMI with critical CAD - 95% LM and high grade ostial RCA - CABG on 5/6 for  critical LM disease (LIMA -> LAD, SVG -> OM, SVG -> RCA) - Continue ASA/statin   - Add Plavix soon   3. Acute hypoxic respiratory failure due to pulmonary edema - on vent - Possible aspiration 5/8 & 5/10  - 5/6 Blood CX - NG  - Antibiotics completed 5/16 - CCM following - SBT per PCCM. Mental status remains major issue May need trach to enable wean    4. AKI / Uremia  - due to ATN - Cr 1.0 -> 1.7 -> 1.95-->2.3 ->3->3.3->3.5->2.9-> 2.8 -> 2.7 -> 2.37->2.48->2.71->2.70->2.5 - BUN elevated 127 but likely due to protein load in TPN.  - Nephrology following, no indication for HD currently.  - Support hemodynamics.    5. Ventral Hernia  - 5/10 NG placed to decompress   6. ID - Finished zosyn for possible aspiration 5/16.  - Blood CX 5/6- NGTD, UC 5/13 NGTD  - AF overnight. WBC nl   7. Anemia - Hgb 6.1 - transfuse 1 U PRBC     Length of Stay: Galliano PA-C 10/06/2020, 7:43 AM  Advanced Heart Failure Team Pager [phone] (M-F; 7a - 4p)  Please contact [facility] for night-coverage after hours (4p -7a ) and weekends on [email or web address]   Agree with above.   Continues to struggle with agitation and vent wean as well as gastric ileus. Volume status and output ok. Creatinine slightly better.   General:  On vent. Will awake but not follow commands HEENT: normal + ETT NGT and cor-trak Neck: supple.JVP 8-9 . Carotids 2+ bilat; no bruits. No lymphadenopathy or thryomegaly appreciated. Cor: PMI nondisplaced. Regular rate & rhythm. No rubs, gallops or murmurs. Lungs: coarse Abdomen: soft, nontender, nondistended. No hepatosplenomegaly.  No bruits or masses. + ventral hernia  Extremities: no cyanosis, clubbing, rash, edema Neuro: as above  Continues to struggle with vent wean and agitation. Would continue milrinone for now. No diuretics today. Hopefully mental status will clear some as BUN comes down. Continue TPN and trickle TFs.   CRITICAL CARE Performed by:  Glori Bickers  Total critical care time: 35 minutes  Critical care time was exclusive of separately billable procedures and treating other patients.  Critical care was necessary to treat or prevent imminent or life-threatening deterioration.  Critical care was time spent personally by me (independent of midlevel providers or residents) on the following activities: development of treatment plan with patient and/or surrogate as well as nursing, discussions with consultants, evaluation of patient's response to treatment, examination of patient, obtaining history from patient or surrogate, ordering and performing treatments and interventions, ordering and review of laboratory studies, ordering and review of radiographic studies, pulse oximetry and re-evaluation of patient's condition.  Glori Bickers, MD  8:59 AM

## 2020-10-06 NOTE — Progress Notes (Signed)
eLink Physician-Brief Progress Note Patient Name: Jack Vance DOB: 11-28-1940 MRN: [id number]   Date of Service  10/06/2020  HPI/Events of Note  Hemoglobin 6.1, no obvious bleeding focus .  eICU Interventions  Transfusion of one unit PRBC ordered.        Jack Vance 10/06/2020, 5:53 AM

## 2020-10-06 NOTE — Progress Notes (Signed)
NAME:  Jack Vance, MRN:  [id number], DOB:  [birth year], LOS: 13 ADMISSION DATE:  09/23/2020, CONSULTATION DATE:  09/24/2019 REFERRING MD:  Jacinto Halim, CHIEF COMPLAINT:  Acute coronary syndrome   History of Present Illness:  80 year old man who presented with cardiogenic shock secondary to NSTEMI.  Presented with new onset RSCP and severe dyspnea. Found to have acute pulmonary edema and heart failure; intubated and sent for urgent LHC which revealed 3V CAD.  IABP inserted and patient underwent urgent CABG. IABP removed 5/9. Continues to require full mechanical ventilation support.  Pertinent  Medical History   Past Medical History:  Diagnosis Date  . Arthritis   . Asthma   . DDD (degenerative disc disease), lumbar   . Hernia of abdominal wall   . RLS (restless legs syndrome)    Significant Hospital Events: Including procedures, antibiotic start and stop dates in addition to other pertinent events   . 5/6 CABG with Dr Cliffton Asters with LIMA to LAD, SVG to RCA d and SVG to ramus. LV normal and returned to ICU with no vasopressor support.  . No post-operative bleeding but poor RV performance on hemodynamics.  . 5/8 significant hemodynamic instability with labile blood pressure and vasopressor requirements yesterday.  Episode of emesis but no evidence of bowel obstruction.  Evidence of pericardial tamponade. . 5/9 improved hemodynamic, able now to tolerate balloon pump weaning and removal. . 5/11 Stable hemodynamics, continuing diuresis. Improved UOP. TPN consult for initiation 5/12 in the setting of ileus, prolonged NPO status.  . 5/13 sedated, diuresis, milrinone, PICC line today, CVL removed  . 5/14 tolerated short SBT/SAT on Precedex . 5/16 Not tolerating SBT due to significant tachypnea to 50s, placed back on PRVC. Marland Kitchen 5/17 Improved SBT toleration, weaning milrinone, minimizing sedation. Bronched w/ c/f mucus plugging, airways clear, ETT exchanged due to significant buildup. Diuresis  held. . 5/18 More agitated overnight requiring increased sedation with addition of Fentanyl/Versed, Seroquel started. BUN continues to rise. Nephro consult.  . 5/19 Continues to be agitated overnight requiring increased sedation with fentanyl/versed. Started Klonopin, Seroquel and Requip yesterday. Started on valproate and seroquel increased.  Interim History / Subjective:  Increased agitation overnight Required increased sedation with addition of Fentanyl/Versed Seroquel, Klonopin and Requip started Continues on milrinone 0.125 and precedex 0.7  Objective   Blood pressure (!) 105/50, pulse 95, temperature 99.3 F (37.4 C), temperature source Oral, resp. rate (!) 29, height 5\' 6"  (1.676 m), weight 78.7 kg, SpO2 99 %. CVP:  [1 mmHg-14 mmHg] 1 mmHg  Vent Mode: PRVC FiO2 (%):  [40 %] 40 % Set Rate:  [22 bmp] 22 bmp Vt Set:  [560 mL] 560 mL PEEP:  [5 cmH20] 5 cmH20 Plateau Pressure:  [20 cmH20-26 cmH20] 26 cmH20   Intake/Output Summary (Last 24 hours) at 10/06/2020 0935 Last data filed at 10/06/2020 0600 Gross per 24 hour  Intake 5113.09 ml  Output 2565 ml  Net 2548.09 ml   Filed Weights   10/04/20 0800 10/05/20 0437 10/06/20 0500  Weight: 75.7 kg 77.6 kg 78.7 kg   Physical Examination: General: Chronically ill-appearing male in NAD. Mildly agitated and appears uncomfortable. HEENT: [place]/AT, anicteric sclera, PERRL, moist mucous membranes. ETT/OG/Cortrak in place. Neuro: Lethargic. Withdraws to pain in all 4 extremities. Opens eyes to voice but does not track. Does not respond to questions. Not following commands. Moves all 4 extremities spontaneously. CV: Irregularly irregular rhythm. No M/R/G appreciated. PULM: Mildly labored breathing on vent (RR 22, PEEP 5, FiO2 40%, TV 560).  Rhonchorous breath sounds throughout lung fields. GI: Soft, nontender, moderately distended with protuberant ventral hernia (soft, reducible). Normoactive bowel sounds. Extremities: Trace LE edema noted. SCDs  in place. Skin: Warm/dry, no rashes.  Labs/imaging that I have personally reviewed: (right click and "Reselect all SmartList Selections" daily)  WBC 6.3 (9.0), H&H 6.1/20 (7.9/26.9), Plt 182 (196)  Na 145 (151), K 3.9, CO2 22 (25), BUN 127 (123), Cr 2.5 (2.71), peak 2.9 this admission  Mg 2.6, Phos 3.2  Glucoses 145-199 last 24H  Co-ox 59.3 (74.5) %  PORTABLE CHEST 1 VIEW IMPRESSION: 1.  Lines and tubes in stable position. 2.  Prior CABG.  Heart size stable. 3. Bilateral mild interstitial prominence suggesting interstitial edema again noted. No interim change. Tiny bilateral pleural effusions again noted without interim change. Resolved Hospital Problem List   NSTEMI, Thrombocytopenia, Hypernatremia  Assessment & Plan:   Acute hypoxemic hypercapnic respiratory failure requiring intubation mechanical ventilation Possible aspiration - Continue full vent support (4-8cc/kg IBW) - Wean FiO2 for O2 sat > 90% - Daily WUA/SBT, continues to tolerate only short SBTs 2/2 tachypnea - VAP bundle - Pulmonary hygiene - PAD protocol for sedation: Precedex for goal RASS 0 to -1; required Fentanyl/Versed overnight - Seroquel increased today; Continue Klonopin - Start valproate - S/p Zosyn course - CT head to rule out neurologic cause of encephalopathy - Extubation continues to be precluded by weakness evidenced by increased WOB/tachypnea and mental status with continued need for sedation for vent tolerance - Will need to discuss trach in the upcoming days/weeks if unable to tolerate weaning  Critically ill due to RV dysfunction following ACS and cardiopulmonary bypass  Cardiogenic shock NSTEMI with culprit RCA involvement Coronary artery disease - Appreciate management of TCTS/HF teams - Diuresis per nephrology - Continue amiodarone - Milrinone at 0.125  Acute kidney injury Uremia? Likely multifactorial; favor prerenal in the setting of hemodynamic insults, cardiogenic shock,  aggressive diuresis for volume management, medications and Urine Na <10. Baseline Cr ~1.0. Nephrology thinks less likely uremic in nature despite rising BUN. - Nephro consulted and following; appreciates recs - Maintain HDS - Trend BMP - Replete electrolytes as indicated - D5 for hypernatremia management - Monitor I&Os - F/u urine studies - Avoid nephrotoxic agents as able - Ensure adequate renal perfusion  Gastric ileus, improved Increasing abdominal distention and hypoactive bowel sounds. Tolerating trickle TFs. - TF rate at 49mL/hr via Cortrak - Continue OGT - Continue TPN - Bowel regimen  Agitated delirium controlled Alcohol abuse - Delirium precautions - Continue seroquel - Continue Klonopin - Start valproate  Anemia (baseline appears to be ~10) Patient with Hgb 6.1 today. Do not think this is an upper GI bleed in nature given no blood in NGT.  - Transfuse 2x pRBC - Continue to monitor - Holding Heparin  RLS - Continue home Requip  DM - Lantus, novolog, SSI and CBG  Best Practice: (right click and "Reselect all SmartList Selections" daily)  Diet:  Tube Feed  + TPN Pain/Anxiety/Delirium protocol (if indicated): Yes (RASS goal 0) to -1 VAP protocol (if indicated): Yes DVT prophylaxis: Subcutaneous Heparin  GI prophylaxis: PPI Glucose control:  SSI Yes Central venous access:  Yes, and it is still needed Arterial line:  Yes, and it is still needed Foley:  Yes, and it is still needed Mobility:  bed rest  PT consulted: N/A Last date of multidisciplinary goals of care discussion [Per Primary] Code Status:  full code Disposition: ICU  Critical care time: 30 minutes  Melvyn Neth, Medical Student

## 2020-10-06 NOTE — Progress Notes (Signed)
[address]       [place] [address]             [phone]                 13 Days Post-Op Procedure(s) (LRB): CORONARY ARTERY BYPASS GRAFTING (CABG)  TIMES 4 USING LEFT GREATER SAPHENOUS VEIN HARVESTED ENDOSCOPICALLY AND LEFT INTERNAL MAMMARY ARTERY (N/A) TRANSESOPHAGEAL ECHOCARDIOGRAM (TEE) (N/A)   Events: No events   _______________________________________________________________ Vitals: BP (!) 105/50   Pulse 95   Temp 99.3 F (37.4 C) (Oral)   Resp (!) 29   Ht 5\' 6"  (1.676 m)   Wt 78.7 kg   SpO2 97%   BMI 28.00 kg/m   - Neuro: sedated.    - Cardiovascular: Normal sinus rhythm.  I  Drips:  milrinone at 0.125 CVP:  [1 mmHg-14 mmHg] 1 mmHg  - Pulm:  Vent Mode: PRVC FiO2 (%):  [40 %] 40 % Set Rate:  [22 bmp] 22 bmp Vt Set:  [560 mL] 560 mL PEEP:  [5 cmH20] 5 cmH20 Plateau Pressure:  [20 cmH20-25 cmH20] 25 cmH20  ABG    Component Value Date/Time   PHART 7.410 09/26/2020 0539   PCO2ART 31.1 (L) 09/26/2020 0539   PO2ART 136 (H) 09/26/2020 0539   HCO3 19.9 (L) 09/26/2020 0539   TCO2 21 (L) 09/26/2020 0539   ACIDBASEDEF 4.0 (H) 09/26/2020 0539   O2SAT 59.3 10/06/2020 0341    - Abd: distended, but soft - Extremity: Warm  .Intake/Output      05/18 0701 05/19 0700 05/19 0701 05/20 0700   I.V. (mL/kg) 3809 (48.4)    NG/GT 1670    IV Piggyback     Total Intake(mL/kg) 5479 (69.6)    Urine (mL/kg/hr) 1890 (1)    Emesis/NG output 850    Stool     Total Output 2740    Net +2739            _______________________________________________________________ Labs: CBC Latest Ref Rng & Units 10/06/2020 10/06/2020 10/05/2020  WBC 4.0 - 10.5 K/uL 6.3 5.9 8.5  Hemoglobin 13.0 - 17.0 g/dL 6.1(LL) 5.9(LL) 7.2(L)  Hematocrit 39.0 - 52.0 % 20.0(L) 18.2(L) 23.4(L)  Platelets 150 - 400 K/uL 182 155 177   CMP Latest Ref Rng & Units 10/06/2020 10/05/2020 10/04/2020  Glucose 70 - 99 mg/dL 10/06/2020) 235(T) 732(K)  BUN 8 - 23 mg/dL 025(K) 270(W)  237(S)  Creatinine 0.61 - 1.24 mg/dL 283(T) 5.17(O) 1.60(V)  Sodium 135 - 145 mmol/L 145 144 151(H)  Potassium 3.5 - 5.1 mmol/L 3.9 4.5 4.9  Chloride 98 - 111 mmol/L 118(H) 116(H) 120(H)  CO2 22 - 32 mmol/L 22 21(L) 23  Calcium 8.9 - 10.3 mg/dL 7.3(L) 7.5(L) 7.8(L)  Total Protein 6.5 - 8.1 g/dL 5.0(L) - -  Total Bilirubin 0.3 - 1.2 mg/dL 0.7 - -  Alkaline Phos 38 - 126 U/L 46 - -  AST 15 - 41 U/L 25 - -  ALT 0 - 44 U/L 23 - -    CXR: Interstitial edema  _______________________________________________________________  Assessment and Plan: POD 13 s/p emergency CABG.  Neuro: Wean sedation as tolerated. CV:  milr to 0.125 Pulm: Wean vent per CCM.  Pt likely will need trach in the next week or so. Renal: nephrology on board GI: On TPN and trickle tube feeds.  NG output 850.  Pt did have bowel movement 2 days ago. Heme: Stable 3.71(G, off zosyn Endo: Sliding scale insulin. Dispo: Continue  ICU care.   Corliss Skains 10/06/2020 7:38 AM

## 2020-10-06 NOTE — Progress Notes (Signed)
PHARMACY - TOTAL PARENTERAL NUTRITION CONSULT NOTE   Indication: Prolonged ileus  Patient Measurements: Height: 5\' 6"  (167.6 cm) Weight: 78.7 kg (173 lb 8 oz) IBW/kg (Calculated) : 63.8 TPN AdjBW (KG): 68.6 Body mass index is 28 kg/m.  Assessment: 80 y/o M presents with cardiogenic shock secondary to NSTEMI, s/p urgent cath revealing 3V CAD and subsequent emergent CABG. Patient with ileus post-op with prolonged NPO status. Cortrak placed 5/11. Trickle tube were attempted x 2 (5/7-5/8, 5/9-5/11) and held both times with vomiting. Pharmacy consulted to start TPN.  Patient currently intubated, sedated in the ICU on Precedex, Seroquel BID, Cardiac: continuing on support with milrinone 0.125 mcg/kg/min. Amio IV>po, hydralazine/tube, Isordil,  GI: Dulcolax supp.daily.Reglan 5mg  IV q6h, IV PPI/24h, Miralax/tube BID,   Glucose / Insulin: No hx DM. A1c 6.3% (09/25/20). CBGs . Insulin drip off, -Insulin: 30 units SSI + Lantus 8 units bid in last 24hrs - off D5W  Electrolytes: Na 145, Cl 118, K 3.9  (goal >/=4 with ileus), Phos 3.2, Mag 2.6, others WNL  Renal: AKI - Scr down 2.51 (SCr 1 on 5/6), BUN up to 127. Lasix drip stopped 5/14  Hepatic: LFTs WNL, TG down 162. Albumin 1.5, prealbumin up 14.7  Intake / Output; MIVF: UOP 1 ml/kg/hr, NGT output up 850 ml/24hrs, +BM noted 5/17, now on daily bisacodyl supp -  Pivot 1.5 TF at 62ml/hr, Prosource 78ml BID,   GI Imaging: none since TPN start GI Surgeries / Procedures: none since TPN start  Central access: CVC triple lumen placed 09/24/20 (removed 5/13); triple lumen PICC placed 09/30/20 TPN start date: 09/29/20  Nutritional Goals (per RD recommendation on 09/29/20): kCal: 11/29/20, Protein: 135-180g, Fluid: >/=2 L/day  - Goal TPN rate is 95 mL/hr (provides 137 g of protein and 2178 kcals per day) - Tube feed regimen when at goal provides 2240 kcal and 157 g protein (Pivot 1.5 at 34ml/hr + Prosourse 46ml bid + Free water 35ml q 4h)  Current  Nutrition:  NPO and TPN  Pivot 1.5 at 20 ml/hr - plan for slow increase to goal Prosource TF 45 ml BID (each provides 40 kcal and 11 g protein)  Plan:  No change to TPN today. No mention of increasing TF in MD notes. Continue TPN at goal rate of 95 mL/hr at 1800.  TPN will provide 137g protein and 2178 kCal, meeting 100% of patient needs Electrolytes in TPN: Incr K to 50 meq/l and decr Mag to 3 meq/L Add standard MVI and trace elements + thiamine and folic acid per dietitian. KCL 10 meq / 50 ml IV x 2 Continue TCTS q4h SSI for now and adjust as needed Lantus 8 units SQ bid - adjustment per MD Monitor TPN labs Mon/Thurs and prn F/u plans for extubation, toleration of TF/diet and ability to wean TPN  31m, PharmD, [facility] Clinical Pharmacist Please see AMION for all Pharmacists' Contact Phone Numbers 10/06/2020, 8:48 AM

## 2020-10-07 DIAGNOSIS — I214 Non-ST elevation (NSTEMI) myocardial infarction: Secondary | ICD-10-CM | POA: Diagnosis not present

## 2020-10-07 DIAGNOSIS — I5021 Acute systolic (congestive) heart failure: Secondary | ICD-10-CM | POA: Diagnosis not present

## 2020-10-07 LAB — GLUCOSE, CAPILLARY
Glucose-Capillary: 146 mg/dL — ABNORMAL HIGH (ref 70–99)
Glucose-Capillary: 133 mg/dL — ABNORMAL HIGH (ref 70–99)
Glucose-Capillary: 143 mg/dL — ABNORMAL HIGH (ref 70–99)
Glucose-Capillary: 142 mg/dL — ABNORMAL HIGH (ref 70–99)
Glucose-Capillary: 179 mg/dL — ABNORMAL HIGH (ref 70–99)

## 2020-10-07 LAB — BASIC METABOLIC PANEL
Anion gap: 7 (ref 5–15)
Anion gap: 6 (ref 5–15)
BUN: 116 mg/dL — ABNORMAL HIGH (ref 8–23)
BUN: 112 mg/dL — ABNORMAL HIGH (ref 8–23)
CO2: 22 mmol/L (ref 22–32)
CO2: 25 mmol/L (ref 22–32)
Calcium: 7.7 mg/dL — ABNORMAL LOW (ref 8.9–10.3)
Calcium: 7.7 mg/dL — ABNORMAL LOW (ref 8.9–10.3)
Chloride: 118 mmol/L — ABNORMAL HIGH (ref 98–111)
Chloride: 118 mmol/L — ABNORMAL HIGH (ref 98–111)
Creatinine, Ser: 2.36 mg/dL — ABNORMAL HIGH (ref 0.61–1.24)
Creatinine, Ser: 2.32 mg/dL — ABNORMAL HIGH (ref 0.61–1.24)
GFR, Estimated: 27 mL/min — ABNORMAL LOW (ref >60)
GFR, Estimated: 28 mL/min — ABNORMAL LOW (ref >60)
Glucose, Bld: 159 mg/dL — ABNORMAL HIGH (ref 70–99)
Glucose, Bld: 132 mg/dL — ABNORMAL HIGH (ref 70–99)
Potassium: 4.3 mmol/L (ref 3.5–5.1)
Potassium: 4 mmol/L (ref 3.5–5.1)
Sodium: 147 mmol/L — ABNORMAL HIGH (ref 135–145)
Sodium: 149 mmol/L — ABNORMAL HIGH (ref 135–145)

## 2020-10-07 LAB — TYPE AND SCREEN
ABO/RH(D): A POS
Antibody Screen: NEGATIVE
Unit division: 0
Unit division: 0

## 2020-10-07 LAB — BPAM RBC
Blood Product Expiration Date: 202206052359
Blood Product Expiration Date: 202206052359
ISSUE DATE / TIME: 202205191243
ISSUE DATE / TIME: 202205191243
Unit Type and Rh: 6200
Unit Type and Rh: 6200

## 2020-10-07 LAB — MAGNESIUM: Magnesium: 2.5 mg/dL — ABNORMAL HIGH (ref 1.7–2.4)

## 2020-10-07 LAB — CBC
HCT: 25.5 % — ABNORMAL LOW (ref 39.0–52.0)
Hemoglobin: 8.1 g/dL — ABNORMAL LOW (ref 13.0–17.0)
MCH: 28.3 pg (ref 26.0–34.0)
MCHC: 31.8 g/dL (ref 30.0–36.0)
MCV: 89.2 fL (ref 80.0–100.0)
Platelets: 211 10*3/uL (ref 150–400)
RBC: 2.86 MIL/uL — ABNORMAL LOW (ref 4.22–5.81)
RDW: 17.8 % — ABNORMAL HIGH (ref 11.5–15.5)
WBC: 7.1 10*3/uL (ref 4.0–10.5)
nRBC: 0 % (ref 0.0–0.2)

## 2020-10-07 LAB — COOXEMETRY PANEL
Carboxyhemoglobin: 0.9 % (ref 0.5–1.5)
Methemoglobin: 0.8 % (ref 0.0–1.5)
O2 Saturation: 76.8 %
Total hemoglobin: 16.2 g/dL — ABNORMAL HIGH (ref 12.0–16.0)

## 2020-10-07 MED ORDER — DEXTROSE 5 % IV SOLN: INTRAVENOUS | Status: DC

## 2020-10-07 MED ORDER — TRAVASOL 10 % IV SOLN
INTRAVENOUS | Status: DC
  Filled 2020-10-07: qty 1368

## 2020-10-07 MED ORDER — FUROSEMIDE 10 MG/ML IJ SOLN
40.0000 mg | Freq: Once | INTRAMUSCULAR | Status: AC
  Administered 2020-10-07: 40 mg via INTRAVENOUS
  Filled 2020-10-07: qty 4

## 2020-10-07 MED ORDER — INSULIN GLARGINE 100 UNIT/ML ~~LOC~~ SOLN
15.0000 [IU] | Freq: Two times a day (BID) | SUBCUTANEOUS | Status: DC
  Administered 2020-10-07 (×2): 15 [IU] via SUBCUTANEOUS
  Filled 2020-10-07 (×4): qty 0.15

## 2020-10-07 MED ORDER — PIVOT 1.5 CAL PO LIQD
1000.0000 mL | ORAL | Status: DC
  Administered 2020-10-07 – 2020-10-19 (×15): 1000 mL

## 2020-10-07 MED ORDER — ATORVASTATIN CALCIUM 80 MG PO TABS
80.0000 mg | ORAL_TABLET | Freq: Every day | ORAL | Status: DC
  Administered 2020-10-07 – 2020-10-20 (×14): 80 mg
  Filled 2020-10-07 (×14): qty 1

## 2020-10-07 NOTE — Progress Notes (Addendum)
Advanced Heart Failure Rounding Note   Subjective:    Underwent emergent CABG on 5/6 for critical LM disease (LIMA -> LAD, SVG -> OM, SVG -> RCA) 5/6 Cultures negative.  5/8 Possible aspiration. A fib --> started amio drip.  Given 2u PRBCs. Hypotensive in the evening and started on vasopressin + antibiotics.  5/9 IABP removed 5/10 NG placed >1.5 out  5/11 started neostigmime 5/12 Febrile 102 5/17 Failed SBT. Underwent bronchoscopy for attempted therapeutic aspiration for mucus plugging. ETT exchanged.  5/19 Hgb 6.1 given 1UPRBC   Remains on vent. FiO2 40%.  Agitated this morning.    Remains on milrinone 0.125. CO-OX 77%.     Objective:   Weight Range:  Vital Signs:   Temp:  [98.6 F (37 C)-100.5 F (38.1 C)] 99.2 F (37.3 C) (05/20 0759) Pulse Rate:  [86] 86 (05/20 0744) Resp:  [13-36] 28 (05/20 0744) BP: (103-156)/(50-78) 139/68 (05/20 0600) SpO2:  [97 %-99 %] 99 % (05/20 0744) FiO2 (%):  [40 %] 40 % (05/20 0744) Weight:  [79.8 kg] 79.8 kg (05/20 0400) Last BM Date: 10/05/20  Weight change: Filed Weights   10/05/20 0437 10/06/20 0500 10/07/20 0400  Weight: 77.6 kg 78.7 kg 79.8 kg    Intake/Output:   Intake/Output Summary (Last 24 hours) at 10/07/2020 0815 Last data filed at 10/07/2020 0600 Gross per 24 hour  Intake 5022.99 ml  Output 3400 ml  Net 1622.99 ml     PHYSICAL EXAM: CVP 11-12   General:  Intubated  HEENT: ETT tube /NG Neck: supple. JVP hard to assess . Carotids 2+ bilat; no bruits. No lymphadenopathy or thryomegaly appreciated. Cor: PMI nondisplaced. Regular rate & rhythm. No rubs, gallops or murmurs. Lungs: Coarse throughout.  Abdomen: soft, nontender, nondistended. No hepatosplenomegaly. No bruits or masses. Good bowel sounds. Extremities: no cyanosis, clubbing, rash, edema Neuro: Opens eyes. Not following commands. MAE   Telemetry:NSR personally reviewed.   Labs: Basic Metabolic Panel: Recent Labs  Lab 10/01/20 1307 10/02/20 0355  10/03/20 0424 10/04/20 0741 10/04/20 1356 10/05/20 0503 10/06/20 0456 10/07/20 0327  NA 143   < > 147* 152* 151* 144 145 147*  K 3.4*   < > 3.7 4.7 4.9 4.5 3.9 4.3  CL 100   < > 112* 121* 120* 116* 118* 118*  CO2 34*   < > $R'29 25 23 'Lz$ 21* 22 22  GLUCOSE 174*   < > 160* 176* 212* 305* 227* 159*  BUN 85*   < > 100* 115* 123* 121* 127* 116*  CREATININE 2.58*   < > 2.48* 2.70* 2.71* 2.70* 2.51* 2.36*  CALCIUM 8.5*   < > 8.5* 8.1* 7.8* 7.5* 7.3* 7.7*  MG 2.1  --  2.2  --   --   --  2.6* 2.5*  PHOS  --   --  1.5*  --   --   --  3.2  --    < > = values in this interval not displayed.    Liver Function Tests: Recent Labs  Lab 10/03/20 0424 10/04/20 0741 10/06/20 0456  AST 42* 38 25  ALT $Re'17 24 23  'JVt$ ALKPHOS 72 65 46  BILITOT 1.0 1.1 0.7  PROT 5.4* 6.1* 5.0*  ALBUMIN 1.7* 1.9* 1.5*   No results for input(s): LIPASE, AMYLASE in the last 168 hours. No results for input(s): AMMONIA in the last 168 hours.  CBC: Recent Labs  Lab 10/03/20 0424 10/04/20 0444 10/05/20 0350 10/06/20 0320 10/06/20 0434 10/06/20 2155 10/07/20  0327  WBC 12.1* 9.0 8.5 5.9 6.3  --  7.1  NEUTROABS 9.5*  --   --   --   --   --   --   HGB 8.2* 7.9* 7.2* 5.9* 6.1* 8.0* 8.1*  HCT 26.9* 26.9* 23.4* 18.2* 20.0* 25.6* 25.5*  MCV 87.1 100.4* 109.9* 103.4* 97.1  --  89.2  PLT 181 196 177 155 182  --  211    Cardiac Enzymes: No results for input(s): CKTOTAL, CKMB, CKMBINDEX, TROPONINI in the last 168 hours.  BNP: BNP (last 3 results) Recent Labs    09/23/20 0311  BNP 1,191.8*    ProBNP (last 3 results) No results for input(s): PROBNP in the last 8760 hours.    Other results:  Imaging: CT HEAD WO CONTRAST  Result Date: 10/06/2020 CLINICAL DATA:  80 year old male with history of delirium. Cardiogenic shock. EXAM: CT HEAD WITHOUT CONTRAST TECHNIQUE: Contiguous axial images were obtained from the base of the skull through the vertex without intravenous contrast. COMPARISON:  No priors. FINDINGS: Brain:  Mild cerebral atrophy. Patchy and confluent areas of decreased attenuation are noted throughout the deep and periventricular white matter of the cerebral hemispheres bilaterally, compatible with chronic microvascular ischemic disease. No evidence of acute infarction, hemorrhage, hydrocephalus, extra-axial collection or mass lesion/mass effect. Vascular: No hyperdense vessel or unexpected calcification. Skull: Normal. Negative for fracture or focal lesion. Sinuses/Orbits: Multifocal mucosal thickening throughout the paranasal sinuses with some air-fluid levels noted in the sphenoid sinuses bilaterally. Status post bilateral maxillary antrectomy. Other: None. IMPRESSION: 1. No acute intracranial abnormalities. 2. Mild cerebral atrophy with chronic microvascular ischemic changes in the cerebral white matter, as above. 3. Changes of chronic sinus disease with air-fluid levels in the sphenoid sinuses bilaterally, which could indicate acute sinus infection as well. Electronically Signed   By: Vinnie Langton M.D.   On: 10/06/2020 11:52   DG CHEST PORT 1 VIEW  Result Date: 10/06/2020 CLINICAL DATA:  Intubation. EXAM: PORTABLE CHEST 1 VIEW COMPARISON:  10/04/2020.  10/02/2020.  01/08/2006. FINDINGS: Endotracheal tube, NG tube, left PICC line in stable position. Prior CABG. Heart size stable. Bilateral mild interstitial prominence suggesting interstitial edema again noted. Tiny bilateral pleural effusions again noted. No pneumothorax. Postsurgical changes right humerus. IMPRESSION: 1.  Lines and tubes in stable position. 2.  Prior CABG.  Heart size stable. 3. Bilateral mild interstitial prominence suggesting interstitial edema again noted. No interim change. Tiny bilateral pleural effusions again noted without interim change. Electronically Signed   By: Marcello Moores  Register   On: 10/06/2020 06:12     Medications:     Scheduled Medications: . amiodarone  200 mg Per Tube BID  . aspirin  324 mg Per Tube Daily  .  bisacodyl  10 mg Rectal Daily  . chlorhexidine gluconate (MEDLINE KIT)  15 mL Mouth Rinse BID  . Chlorhexidine Gluconate Cloth  6 each Topical Daily  . clonazePAM  1 mg Per Tube BID  . feeding supplement (PIVOT 1.5 CAL)  1,000 mL Per Tube Q24H  . free water  150 mL Per Tube Q4H  . hydrALAZINE  25 mg Per Tube Q8H  . insulin aspart  0-24 Units Subcutaneous Q4H  . insulin aspart  6 Units Subcutaneous Q4H  . insulin glargine  15 Units Subcutaneous BID  . ipratropium-albuterol  3 mL Nebulization Q6H  . isosorbide dinitrate  10 mg Per Tube TID  . mouth rinse  15 mL Mouth Rinse 10 times per day  . metoCLOPramide (REGLAN) injection  5 mg Intravenous Q6H  . pantoprazole (PROTONIX) IV  40 mg Intravenous Q24H  . polyethylene glycol  17 g Per Tube BID  . QUEtiapine  50 mg Per Tube BID  . rOPINIRole  3 mg Per Tube TID  . sodium chloride flush  10-40 mL Intracatheter Q12H  . sodium chloride flush  3 mL Intravenous Q12H  . valproic acid  250 mg Per Tube BID    Infusions: . dexmedetomidine (PRECEDEX) IV infusion 0.7 mcg/kg/hr (10/07/20 0600)  . dextrose    . fentaNYL infusion INTRAVENOUS Stopped (10/01/20 0024)  . lactated ringers Stopped (10/01/20 2836)  . milrinone 0.125 mcg/kg/min (10/07/20 0600)  . TPN ADULT (ION) 95 mL/hr at 10/07/20 0600  . TPN ADULT (ION)      PRN Medications: acetaminophen (TYLENOL) oral liquid 160 mg/5 mL, albuterol, fentaNYL (SUBLIMAZE) injection, lip balm, metoprolol tartrate, midazolam, ondansetron (ZOFRAN) IV, sodium chloride flush, sodium chloride flush, sorbitol   Assessment/Plan:   1. 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  - Co-ox 77%. Stop milrinone.  - CVP trending up 11-12. Give 40 mg IV lasix x1.  - No ACE/ARB/ARNI/MRA with AKI. No b-blocker yet with shock.  - c/w hydral/nitrates for  afterload reduction   2. NSTEMI with critical CAD - 95% LM and high grade ostial RCA - CABG on 5/6 for critical LM disease (LIMA -> LAD, SVG -> OM, SVG -> RCA) - Continue ASA.  - Add statin.  - Add Plavix soon   3. Acute hypoxic respiratory failure due to pulmonary edema - on vent - Possible aspiration 5/8 & 5/10  - 5/6 Blood CX - NG  - Antibiotics completed 5/16 - CCM following - SBT per PCCM.    4. AKI / Uremia  - due to ATN - Cr peaked 3.5->2.4  - BUN elevated 116 but likely due to protein load in TPN.  - Nephrology following, no indication for HD currently.  - Support hemodynamics.   5. Ventral Hernia  - 5/10 NG placed to decompress   6. ID - Finished zosyn for possible aspiration 5/16.  - Blood CX 5/6- NGTD, UC 5/13 NGTD  - T 100.5  - WBC 7.1   7. Anemia - Given 1IPRBC 5/19. - Hgb up from 6.1>8.1.   8. Hypernatremia Getting free water.    Length of Stay: [place] NP-C  10/07/2020, 8:15 AM  Advanced Heart Failure Team Pager [phone] (M-F; 7a - 4p)  Please contact [facility] for night-coverage after hours (4p -7a ) and weekends on [email or web address]  Agree with above.   Remains intubated. Failing SBT due to tachypnea and agitation. Co-ox stable on milrinone. Had large BM overnight. CVP 10-11.   General:  On vent will arouse but not follow commands HEENT: normal Neck: supple. no JVD 10 Carotids 2+ bilat; no bruits. No lymphadenopathy or thryomegaly appreciated. Cor: PMI nondisplaced. Regular rate & rhythm. No rubs, gallops or murmurs. Lungs: clear Abdomen: soft, nontender, nondistended. No hepatosplenomegaly. No bruits or masses. Large ventral hernia  Extremities: no cyanosis, clubbing, rash, edema Neuro: as above  Continues to fail SBT. May need trach next week (understanding high risk of sternal infection) but no other option. Can stop milrinone. Give 1 dose of lasix. With rising co-ox watch closely for sepsis.   CRITICAL CARE Performed by:  Yaris Ferrell,  Shian Goodnow  Total critical care time: 35 minutes  Critical care time was exclusive of separately billable procedures and treating other patients.  Critical care was necessary to treat or prevent imminent or life-threatening deterioration.  Critical care was time spent personally by me (independent of midlevel providers or residents) on the following activities: development of treatment plan with patient and/or surrogate as well as nursing, discussions with consultants, evaluation of patient's response to treatment, examination of patient, obtaining history from patient or surrogate, ordering and performing treatments and interventions, ordering and review of laboratory studies, ordering and review of radiographic studies, pulse oximetry and re-evaluation of patient's condition.  Glori Bickers, MD  4:43 PM

## 2020-10-07 NOTE — Progress Notes (Signed)
[facility] Progress Note    Assessment/ Plan:   1. AKI, prerenal azotemia on the setting of cardiogenic shock 2. At risk for ATN  Kidney function minimally improved overnight from 2.51 to 2.36. overnight from 2.70 to 2.51. He continues to make urine with 1.9 out overnight. His acidosis has resolved. BUN is trending downward. He is more responsive today and able to follow commands. Sodium remains elevated in the setting of insensible losses and a calculated free water deficit of 2.4 liter. He has had a roughly 2 kg increase in weight in the last two days with concern for 3rd spacing/pulmonary edema in the setting of low oncotic pressure.  -Continue pressors as needed to maintain maps greater than 65 -Avoid nephrotoxic medications including ACE/ARB -Continue PPI and consider further work-up for upper GI bleed -Consider decreasing protein load and TPN as possible -Consider restarting lasix today - Will need to start D5W for hypernatremia.  -We will monitor kidney function, electrolytes and urine output closely.   2.  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  Acute systolic heart failure complicated by cardiogenic shock: -Continue pressors monitor Co-ox -Continue with guideline directed medical therapy and agree with avoiding ACE/ARB's and beta-blockers.  4.  Acute respiratory failure secondary to possible aspiration pneumonia - Continue Zosyn for possible aspiration pneumonia - Continue vent management per PCCM  Subjective:   Patient resting in bed with ET tube in place. He is able to follow commands today but remains weak.    Objective:   BP 139/68   Pulse 86   Temp 99.2 F (37.3 C) (Oral)   Resp (!) 28   Ht _0  (1.676 m)   Wt 79.8 kg   SpO2 99%   BMI 28.40 kg/m   Intake/Output Summary (Last 24 hours) at 10/07/2020 4098 Last data filed at 10/07/2020 0600 Gross per 24 hour  Intake  5022.99 ml  Output 3400 ml  Net 1622.99 ml   Weight change: 1.1 kg  Physical Exam: Physical Exam Constitutional:      Appearance: He is ill-appearing and diaphoretic.  HENT:     Head: Normocephalic and atraumatic.     Mouth/Throat:     Comments: ET tube in place   Cardiovascular:     Rate and Rhythm: Normal rate and regular rhythm.     Pulses: Normal pulses.     Heart sounds: Murmur heard.    Pulmonary:     Breath sounds: Rales (bilateral) present.     Comments: Course upper respiratory sounds Abdominal:     General: Bowel sounds are normal. There is distension.     Palpations: Abdomen is soft.     Hernia: A hernia is present.  Musculoskeletal:        General: Swelling (upper and lower extremity pitting edema) present.  Skin:    General: Skin is warm.  Neurological:     Comments: Following commands      Imaging: CT HEAD WO CONTRAST  Result Date: 10/06/2020 CLINICAL DATA:  80 year old male with history of delirium. Cardiogenic shock. EXAM: CT HEAD WITHOUT CONTRAST TECHNIQUE: Contiguous axial images were obtained from the base of the skull through the vertex without intravenous contrast. COMPARISON:  No priors. FINDINGS: Brain: Mild cerebral atrophy. Patchy and confluent areas of decreased attenuation are noted throughout the deep and periventricular white matter of the cerebral hemispheres bilaterally, compatible with chronic microvascular ischemic disease. No evidence of acute infarction,  hemorrhage, hydrocephalus, extra-axial collection or mass lesion/mass effect. Vascular: No hyperdense vessel or unexpected calcification. Skull: Normal. Negative for fracture or focal lesion. Sinuses/Orbits: Multifocal mucosal thickening throughout the paranasal sinuses with some air-fluid levels noted in the sphenoid sinuses bilaterally. Status post bilateral maxillary antrectomy. Other: None. IMPRESSION: 1. No acute intracranial abnormalities. 2. Mild cerebral atrophy with chronic  microvascular ischemic changes in the cerebral white matter, as above. 3. Changes of chronic sinus disease with air-fluid levels in the sphenoid sinuses bilaterally, which could indicate acute sinus infection as well. Electronically Signed   By: Vinnie Langton M.D.   On: 10/06/2020 11:52   DG CHEST PORT 1 VIEW  Result Date: 10/06/2020 CLINICAL DATA:  Intubation. EXAM: PORTABLE CHEST 1 VIEW COMPARISON:  10/04/2020.  10/02/2020.  01/08/2006. FINDINGS: Endotracheal tube, NG tube, left PICC line in stable position. Prior CABG. Heart size stable. Bilateral mild interstitial prominence suggesting interstitial edema again noted. Tiny bilateral pleural effusions again noted. No pneumothorax. Postsurgical changes right humerus. IMPRESSION: 1.  Lines and tubes in stable position. 2.  Prior CABG.  Heart size stable. 3. Bilateral mild interstitial prominence suggesting interstitial edema again noted. No interim change. Tiny bilateral pleural effusions again noted without interim change. Electronically Signed   By: Marcello Moores  Register   On: 10/06/2020 06:12    Labs: BMET Recent Labs  Lab 10/02/20 0355 10/03/20 0424 10/04/20 0741 10/04/20 1356 10/05/20 0503 10/06/20 0456 10/07/20 0327  NA 145 147* 152* 151* 144 145 147*  K 3.6 3.7 4.7 4.9 4.5 3.9 4.3  CL 105 112* 121* 120* 116* 118* 118*  CO2 _0 21* 22 22  GLUCOSE 181* 160* 176* 212* 305* 227* 159*  BUN 88* 100* 115* 123* 121* 127* 116*  CREATININE 2.37* 2.48* 2.70* 2.71* 2.70* 2.51* 2.36*  CALCIUM 8.7* 8.5* 8.1* 7.8* 7.5* 7.3* 7.7*  PHOS  --  1.5*  --   --   --  3.2  --    CBC Recent Labs  Lab 10/03/20 0424 10/04/20 0444 10/05/20 0350 10/06/20 0320 10/06/20 0434 10/06/20 2155 10/07/20 0327  WBC 12.1*   < > 8.5 5.9 6.3  --  7.1  NEUTROABS 9.5*  --   --   --   --   --   --   HGB 8.2*   < > 7.2* 5.9* 6.1* 8.0* 8.1*  HCT 26.9*   < > 23.4* 18.2* 20.0* 25.6* 25.5*  MCV 87.1   < > 109.9* 103.4* 97.1  --  89.2  PLT 181   < > 177 155 182   --  211   < > = values in this interval not displayed.    Medications:    . amiodarone  200 mg Per Tube BID  . aspirin  324 mg Per Tube Daily  . bisacodyl  10 mg Rectal Daily  . chlorhexidine gluconate (MEDLINE KIT)  15 mL Mouth Rinse BID  . Chlorhexidine Gluconate Cloth  6 each Topical Daily  . clonazePAM  1 mg Per Tube BID  . feeding supplement (PIVOT 1.5 CAL)  1,000 mL Per Tube Q24H  . free water  150 mL Per Tube Q4H  . hydrALAZINE  25 mg Per Tube Q8H  . insulin aspart  0-24 Units Subcutaneous Q4H  . insulin aspart  6 Units Subcutaneous Q4H  . insulin glargine  15 Units Subcutaneous BID  . ipratropium-albuterol  3 mL Nebulization Q6H  . isosorbide dinitrate  10 mg Per Tube TID  . mouth  rinse  15 mL Mouth Rinse 10 times per day  . metoCLOPramide (REGLAN) injection  5 mg Intravenous Q6H  . pantoprazole (PROTONIX) IV  40 mg Intravenous Q24H  . polyethylene glycol  17 g Per Tube BID  . QUEtiapine  50 mg Per Tube BID  . rOPINIRole  3 mg Per Tube TID  . sodium chloride flush  10-40 mL Intracatheter Q12H  . sodium chloride flush  3 mL Intravenous Q12H  . valproic acid  250 mg Per Tube BID    Lawerance Cruel, D.O.  Internal Medicine Resident, PGY-2 [facility]  Pager: [phone] 8:19 AM, 10/07/2020

## 2020-10-07 NOTE — Progress Notes (Signed)
[address]       [place] [address]             [phone]                 14 Days Post-Op Procedure(s) (LRB): CORONARY ARTERY BYPASS GRAFTING (CABG)  TIMES 4 USING LEFT GREATER SAPHENOUS VEIN HARVESTED ENDOSCOPICALLY AND LEFT INTERNAL MAMMARY ARTERY (N/A) TRANSESOPHAGEAL ECHOCARDIOGRAM (TEE) (N/A)   Events: No events More awake today Large BM this am   _______________________________________________________________ Vitals: BP 139/68   Pulse 86   Temp 99 F (37.2 C) (Oral)   Resp (!) 28   Ht 5\' 6"  (1.676 m)   Wt 79.8 kg   SpO2 99%   BMI 28.40 kg/m   - Neuro: arousable  - Cardiovascular: Normal sinus rhythm.  I  Drips:  milrinone at 0.125 CVP:  [6 mmHg-12 mmHg] 12 mmHg  - Pulm:  Vent Mode: PRVC FiO2 (%):  [40 %] 40 % Set Rate:  [22 bmp] 22 bmp Vt Set:  [560 mL] 560 mL PEEP:  [5 cmH20] 5 cmH20 Plateau Pressure:  [17 cmH20-27 cmH20] 19 cmH20  ABG    Component Value Date/Time   PHART 7.410 09/26/2020 0539   PCO2ART 31.1 (L) 09/26/2020 0539   PO2ART 136 (H) 09/26/2020 0539   HCO3 19.9 (L) 09/26/2020 0539   TCO2 21 (L) 09/26/2020 0539   ACIDBASEDEF 4.0 (H) 09/26/2020 0539   O2SAT 76.8 10/07/2020 0327    - Abd: distended, but soft - Extremity: Warm  .Intake/Output      05/19 0701 05/20 0700 05/20 0701 05/21 0700   I.V. (mL/kg) 2994.5 (37.5)    Blood 697.5    NG/GT 1281 250   IV Piggyback 50    Total Intake(mL/kg) 5023 (62.9) 250 (3.1)   Urine (mL/kg/hr) 2400 (1.3) 1550 (4)   Emesis/NG output 1000    Total Output 3400 1550   Net +1623 -1300           _______________________________________________________________ Labs: CBC Latest Ref Rng & Units 10/07/2020 10/06/2020 10/06/2020  WBC 4.0 - 10.5 K/uL 7.1 - 6.3  Hemoglobin 13.0 - 17.0 g/dL 8.1(L) 8.0(L) 6.1(LL)  Hematocrit 39.0 - 52.0 % 25.5(L) 25.6(L) 20.0(L)  Platelets 150 - 400 K/uL 211 - 182   CMP Latest Ref Rng & Units 10/07/2020 10/06/2020 10/05/2020  Glucose 70 -  99 mg/dL 10/07/2020) 440(H) 474(Q)  BUN 8 - 23 mg/dL 595(G) 387(F) 643(P)  Creatinine 0.61 - 1.24 mg/dL 295(J) 8.84(Z) 6.60(Y)  Sodium 135 - 145 mmol/L 147(H) 145 144  Potassium 3.5 - 5.1 mmol/L 4.3 3.9 4.5  Chloride 98 - 111 mmol/L 118(H) 118(H) 116(H)  CO2 22 - 32 mmol/L 22 22 21(L)  Calcium 8.9 - 10.3 mg/dL 7.7(L) 7.3(L) 7.5(L)  Total Protein 6.5 - 8.1 g/dL - 5.0(L) -  Total Bilirubin 0.3 - 1.2 mg/dL - 0.7 -  Alkaline Phos 38 - 126 U/L - 46 -  AST 15 - 41 U/L - 25 -  ALT 0 - 44 U/L - 23 -    CXR: pending  _______________________________________________________________  Assessment and Plan: POD 14 s/p emergency CABG.  Neuro: Wean sedation as tolerated. CV:  milr to 0.125.  Will defer to HF on wean Pulm: Wean vent per CCM.  Pt likely will need trach in the next week or so. Renal: nephrology on board GI: On TPN and trickle tube feeds.  NG output 850.  Pt did have bowel movement.  Will  advance tube feeds, and wean TPN Heme: Stable ON:[id number] fevers.  off zosyn Endo: Sliding scale insulin. Dispo: Continue ICU care.   Corliss Skains 10/07/2020 11:55 AM

## 2020-10-07 NOTE — Progress Notes (Signed)
Patient ID: Jack Vance, male   DOB: 1941-03-23, 80 y.o.   MRN: [id number]  TCTS Evening Rounds:   Hemodynamically stable  Milrinone off today.  Remains on vent. Did not do well with SBT today with tachypnea.  Urine output good with lasix. Bowels moved today Tube feeds advanced to 50/hr. Goal is 60. Still on TNA.  CBC    Component Value Date/Time   WBC 7.1 10/07/2020 0327   RBC 2.86 (L) 10/07/2020 0327   HGB 8.1 (L) 10/07/2020 0327   HCT 25.5 (L) 10/07/2020 0327   PLT 211 10/07/2020 0327   MCV 89.2 10/07/2020 0327   MCH 28.3 10/07/2020 0327   MCHC 31.8 10/07/2020 0327   RDW 17.8 (H) 10/07/2020 0327   LYMPHSABS 1.2 10/03/2020 0424   MONOABS 1.1 (H) 10/03/2020 0424   EOSABS 0.2 10/03/2020 0424   BASOSABS 0.1 10/03/2020 0424     BMET    Component Value Date/Time   NA 149 (H) 10/07/2020 1428   K 4.0 10/07/2020 1428   CL 118 (H) 10/07/2020 1428   CO2 25 10/07/2020 1428   GLUCOSE 132 (H) 10/07/2020 1428   BUN 112 (H) 10/07/2020 1428   CREATININE 2.32 (H) 10/07/2020 1428   CALCIUM 7.7 (L) 10/07/2020 1428   GFRNONAA 28 (L) 10/07/2020 1428   GFRAA >60 12/08/2017 0510     A/P: Stable day. Continue current plans.

## 2020-10-07 NOTE — Progress Notes (Addendum)
NAME:  Jack Vance, MRN:  [id number], DOB:  [birth year], LOS: 14 ADMISSION DATE:  09/23/2020, CONSULTATION DATE:  09/24/2019 REFERRING MD:  Jacinto Halim, CHIEF COMPLAINT:  Acute coronary syndrome   History of Present Illness:  80 year old man who presented with cardiogenic shock secondary to NSTEMI.  Presented with new onset RSCP and severe dyspnea. Found to have acute pulmonary edema and heart failure; intubated and sent for urgent LHC which revealed 3V CAD.  IABP inserted and patient underwent urgent CABG. IABP removed 5/9. Continues to require full mechanical ventilation support.  Pertinent  Medical History   Past Medical History:  Diagnosis Date  . Arthritis   . Asthma   . DDD (degenerative disc disease), lumbar   . Hernia of abdominal wall   . RLS (restless legs syndrome)    Significant Hospital Events: Including procedures, antibiotic start and stop dates in addition to other pertinent events   . 5/6 CABG with Dr Cliffton Asters with LIMA to LAD, SVG to RCA d and SVG to ramus. LV normal and returned to ICU with no vasopressor support.  . No post-operative bleeding but poor RV performance on hemodynamics.  . 5/8 significant hemodynamic instability with labile blood pressure and vasopressor requirements yesterday.  Episode of emesis but no evidence of bowel obstruction.  Evidence of pericardial tamponade. . 5/9 improved hemodynamic, able now to tolerate balloon pump weaning and removal. . 5/11 Stable hemodynamics, continuing diuresis. Improved UOP. TPN consult for initiation 5/12 in the setting of ileus, prolonged NPO status.  . 5/13 sedated, diuresis, milrinone, PICC line today, CVL removed  . 5/14 tolerated short SBT/SAT on Precedex . 5/16 Not tolerating SBT due to significant tachypnea to 50s, placed back on PRVC. Marland Kitchen 5/17 Improved SBT toleration, weaning milrinone, minimizing sedation. Bronched w/ c/f mucus plugging, airways clear, ETT exchanged due to significant buildup. Diuresis  held. . 5/18 More agitated overnight requiring increased sedation with addition of Fentanyl/Versed, Seroquel started. BUN continues to rise. Nephro consult.   5/19 Continues to be agitated overnight requiring increased sedation with fentanyl/versed. Started Klonopin, Seroquel and Requip yesterday. Started on valproate and seroquel increased.  5/20 Patient more interactive following commands and maintaining eyes open and tracking.  Interim History / Subjective:  Still agitated requiring PRN versed and fentanyl. Continues precedex 0.7 and TCTS stopping milrinone More interactive today tracking and following commands.  Objective   Blood pressure 139/68, pulse 95, temperature 99.1 F (37.3 C), temperature source Axillary, resp. rate (!) 27, height 5\' 6"  (1.676 m), weight 79.8 kg, SpO2 99 %. CVP:  [6 mmHg-12 mmHg] 12 mmHg  Vent Mode: PRVC FiO2 (%):  [40 %] 40 % Set Rate:  [22 bmp] 22 bmp Vt Set:  [560 mL] 560 mL PEEP:  [5 cmH20] 5 cmH20 Plateau Pressure:  [17 cmH20-26 cmH20] 21 cmH20   Intake/Output Summary (Last 24 hours) at 10/07/2020 0742 Last data filed at 10/07/2020 0600 Gross per 24 hour  Intake 5022.99 ml  Output 3400 ml  Net 1622.99 ml   Filed Weights   10/05/20 0437 10/06/20 0500 10/07/20 0400  Weight: 77.6 kg 78.7 kg 79.8 kg   Physical Examination: General: chronically ill appearing man lying in bed in NAD HEENT: [place]/AT, anicteric sclera, ETT in place Neuro: RASS -1, moving all extremities spontaneously, mildly agitated but not pulling at lines currently. Following some commands with minimal delay. Open eyes and tracking. CV: reg rate, irreg rhythm, no M/R/G appreciated PULM: tachypneic in any vent mode (RR 22, PEEP 5, FiO2 40%,  TV 560)., clear ETT secretions, rhonchi cleared with suctioning GI: large ventral hernia, soft, NT, ND, BS normoactive Extremities: minimal LE edema, no cyanosis or clubbing Skin: Warm/dry, pallor, no rashes  Labs/imaging that I have personally  reviewed: (right click and "Reselect all SmartList Selections" daily)  WBC 7.1 (9.0), H&H 8.1/25.5 (7.9/26.9), Plt 211 (196)  Na 147 (151), K 4.3, CO2 22 (25), BUN 116 (123), Cr 2.36 (2.71), peak 2.9 this admission  Mg 2.5  Glucoses 131-165 last 24H  Co-ox 76.8% (74.5%)  CT HEAD WITHOUT CONTRAST IMPRESSION: 1. No acute intracranial abnormalities. 2. Mild cerebral atrophy with chronic microvascular ischemic changes in the cerebral white matter, as above. 3. Changes of chronic sinus disease with air-fluid levels in the sphenoid sinuses bilaterally, which could indicate acute sinus infection as well.  Resolved Hospital Problem List   NSTEMI, Thrombocytopenia  Assessment & Plan:   Acute hypoxemic hypercapnic respiratory failure requiring mechanical ventilation. Prolonged vent weaning due to NM weakness. Possible aspiration - Con't LTVV, 4-8cc/kg IBW, goal Pplat<30 and DP<15 - VAP prevention protocol - PAD protocol for sedation. Continue valproic acid, seroquel, klonopin - Wean FiO2 for O2 sat > 90% - Continue daily SAT/SBT. Plan for trach Monday 5/23. - Monitoring off antibiotics  Critically ill due to RV dysfunction following ACS and cardiopulmonary bypass  Cardiogenic shock NSTEMI with culprit RCA involvement Coronary artery disease - Appreciate management of TCTS/HF teams. Stopping milrinone. - Continue enteral amiodarone  Acute kidney injury Uremia Hypernatremia Likely multifactorial; favor primarily prerenal in the setting of hemodynamic insults, cardiogenic shock, aggressive diuresis for volume management, medications. Baseline Cr ~1.0.  - Appreciate nephrology's assistance - Serial BMPs - Strict I/Os - Renally dose meds and avoid nephrotoxic meds  Gastric ileus, improved Increasing abdominal distention and hypoactive bowel sounds. Tolerating trickle TFs. - Increase TF to goal 60 ml/hr; currently at 30 ml/hr, working on decreasing TPN volume. - Continue  TPN; will try to stop - Bowel regimen  Anemia (Baseline ~10) Patient with Hgb 6.1 yesterday now s/p 2x pRBC with Hgb 8.1. Do not think this is an upper GI bleed in nature given no blood in NGT.  - Continue to monitor - Heparin held in the setting of low Hgb; can reassess after tomorrow Hgb  Agitated delirium controlled H/o alcohol abuse - Delirium precautions - Seroquel, klonopin, valproate - Supplemental vitamins via TPN  RLS - Continue home Requip  DM - Continue Lantus, novolog, SSI and CBG  Best Practice: (right click and "Reselect all SmartList Selections" daily)  Diet:  Tube Feed  + TPN Pain/Anxiety/Delirium protocol (if indicated): Yes (RASS goal 0) to -1 VAP protocol (if indicated): Yes DVT prophylaxis: Subcutaneous Heparin- held given low Hgb GI prophylaxis: PPI Glucose control:  SSI Yes, lantus, novolog Central venous access:  Yes, and it is still needed Arterial line:  Yes, and it is still needed Foley:  Yes, and it is still needed Mobility:  bed rest  PT consulted: N/A Last date of multidisciplinary goals of care discussion [Per Primary] Code Status:  full code Disposition: ICU  Critical care time: 30 min   Melvyn Neth, [facility] 10/07/20 7:42 AM   Attending note: I have seen and examined the patient. History, labs and imaging reviewed.  80 Y/O with multiple medical issues presenting with NSTEMI, underwent emergent CABG On precedex and milrinone Got 2 units PRBCs in 24 hrs More awake  Blood pressure 139/68, pulse 86, temperature 99.2 F (37.3 C), temperature source Oral, resp. rate (!) 28,  height 5\' 6"  (1.676 m), weight 79.8 kg, SpO2 99 %. Gen:      No acute distress HEENT:  EOMI, sclera anicteric Neck:     No masses; no thyromegaly, ETT Lungs:    Clear to auscultation bilaterally; normal respiratory effort CV:         Regular rate and rhythm; no murmurs Abd:      + bowel sounds; soft, non-tender; no palpable masses, no distension Ext:    No edema;  adequate peripheral perfusion Skin:      Warm and dry; no rash Neuro: Somnolent, arousable  Labs/Imaging personally reviewed, significant for Cr 2.36 Na 146  CT head 5/19 witb no acute abnormalities  Assessment/plan: NSTEMI. S/P CABG Cardiogenic shock Weaning milrinone Lasix  Anemia of chronic illness> No evidence of acute bleed Follow CBC Resume heparin prophylaxis tomorrow if Hb is stable  Acute respiratory failure He is failing weaning trials Discussed with Dr. 6/19. Trach next week  AKI> Cr is better Nephrology on board  Ileus Increasing tube feeds.  Hopeful to DC TPN soon  DM Increase the lantus dose to 15 bid.   The patient is critically ill with multiple organ systems failure and requires high complexity decision making for assessment and support, frequent evaluation and titration of therapies, application of advanced monitoring technologies and extensive interpretation of multiple databases.  Critical care time - 35 mins. This represents my time independent of the NPs time taking care of the pt.  Cliffton Asters MD [facility] 10/07/2020, 9:04 AM

## 2020-10-07 NOTE — Progress Notes (Signed)
Nutrition Follow Up  DOCUMENTATION CODES:   Not applicable  INTERVENTION:   Wean TPN upon tolerance of increased tube feeding  Continue tube feeding  -Pivot 1.5 @ 30 ml/hr via Cortrak -Increase by 10 ml Q4 hours to goal rate of 60 ml/hr (1440 ml) -Free water 150 ml Q4 hours   Goal rate provides: 2160 kcals, 135 grams protein, 1093 ml free water (1993 ml with flushes)  NUTRITION DIAGNOSIS:   Increased nutrient needs related to post-op healing as evidenced by estimated needs.  Ongoing  GOAL:   Patient will meet greater than or equal to 90% of their needs   Meeting with TPN  MONITOR:   Vent status,Labs,I & O's,Skin,TF tolerance,Weight trends  REASON FOR ASSESSMENT:   Consult Enteral/tube feeding initiation and management  ASSESSMENT:   Patient with PMH significant for DDD, asthma, HTN, and large ventral hernia. Presented to [facility] ED with shortness of breath, was found to be in cardiogenic shock with NSTEMI.   5/06- s/p heart cath, confirmed critical CAD, s/p emergent CABG x3 wih IABP placement 5/08- large vomiting episode, TF held 5/09- s/p IABP removed, trickle TF started  5/10- large vomiting episode, TF held 5/11- Cortrak tube advanced to ligament of Treitz 5/12- TPN started  5/14- Pivot 1.5 restarted at 10 ml/hr  5/16- Pivot 1.5 advanced to 20 ml/hr 5/17- s/p bronch showed c/f mucus plugging, ETT exchanged    Pt discussed during ICU rounds and with RN.   Slightly agitated but following commands. Plan for trach Monday. D5 started for hypernatremia. Monitor for hyperglycemia as patient is receiving TPN, D5, and tube feeding.   Okay to advance Pivot 1.5 to goal of 60 ml/hr per CCM. TPN running at 95 ml/hr to provide 137 grams and 2178 kcal. Wean as appropriate.   If patient requires trach and long term feeding access, placement of feeding tube may be difficult given large ventral hernia.   Admission weight: 83 kg  Current weight: 79.8 kg   Patient remains  intubated on ventilator support MV: 13.5 L/min Temp (24hrs), Avg:99.7 F (37.6 C), Min:99 F (37.2 C), Max:100.5 F (38.1 C)   UOP: 2400 ml x 24 hrs  NG: 1000 ml x 24 hrs   Drips: precedex, D5 @ 75 ml/hr  Medications: dulcolax, SS novolog, lantus, 5 mg reglan QID, miralax Labs: Na 147 (H) Mg 2.5 (H) Cr 2.36- down from yesterday CBG 131-155  Diet Order:   Diet Order    None      EDUCATION NEEDS:   Not appropriate for education at this time  Skin:  Skin Assessment: Skin Integrity Issues: Skin Integrity Issues:: Stage II: buttocks  Incisions: bilateral legs, chest  Last BM:  5/20  Height:   Ht Readings from Last 1 Encounters:  09/23/20 5\' 6"  (1.676 m)    Weight:   Wt Readings from Last 1 Encounters:  10/07/20 79.8 kg   BMI:  Body mass index is 28.4 kg/m.  Estimated Nutritional Needs:   Kcal:  10/09/20 kcal  Protein:  135-180 grams  Fluid:  >/= 2 L/day  [id number] RD, LDN Clinical Nutrition Pager listed in AMION

## 2020-10-07 NOTE — Progress Notes (Addendum)
PHARMACY - TOTAL PARENTERAL NUTRITION CONSULT NOTE   Indication: Prolonged ileus  Patient Measurements: Height: 5\' 6"  (167.6 cm) Weight: 79.8 kg (175 lb 14.8 oz) IBW/kg (Calculated) : 63.8 TPN AdjBW (KG): 68.6 Body mass index is 28.4 kg/m.  Assessment: 80 y/o M presents with cardiogenic shock secondary to NSTEMI, s/p urgent cath revealing 3V CAD and subsequent emergent CABG. Patient with ileus post-op with prolonged NPO status. Cortrak placed 5/11. Trickle tube were attempted x 2 (5/7-5/8, 5/9-5/11) and held both times with vomiting. Pharmacy consulted to start TPN.  Patient currently intubated, sedated in the ICU on Precedex, Seroquel BID and clonazapam , Cardiac: continuing on support with milrinone 0.125 mcg/kg/min. Amio IV>po, hydralazine/tube, Isordil,  GI: Dulcolax supp.daily.Reglan 5mg  IV q6h, IV PPI/24h, Miralax/tube BID,   Glucose / Insulin: No hx DM. A1c 6.3% (09/25/20). CBGs 131-165. Insulin drip off, -Insulin: 48 units SSI + Lantus 8 units bid in last 24hrs - off D5W  Electrolytes: Na 147, Cl 118, K 4.3  (goal >/=4 with ileus), Phos 3.2, Mag 2.5, others WNL  Renal: AKI - Scr down 2.36 (SCr 1 on 5/6), BUN down to 116. Lasix drip stopped 5/14  Hepatic: LFTs WNL, TG down 162. Albumin 1.5, prealbumin up 14.7  Intake / Output; MIVF: UOP 1.3 ml/kg/hr, NGT output up 1000 ml/24hrs, +BM noted 5/17, now on daily bisacodyl supp -  Pivot 1.5 TF at 46ml/hr, Prosource 1ml BID,   GI Imaging: none since TPN start GI Surgeries / Procedures: none since TPN start  Central access: CVC triple lumen placed 09/24/20 (removed 5/13); triple lumen PICC placed 09/30/20 TPN start date: 09/29/20  Nutritional Goals (per RD recommendation on 09/29/20): kCal: 11/29/20, Protein: 135-180g, Fluid: >/=2 L/day  - Goal TPN rate is 95 mL/hr (provides 137 g of protein and 2178 kcals per day) - Tube feed regimen when at goal provides 2240 kcal and 157 g protein (Pivot 1.5 at 97ml/hr + Prosourse 74ml bid + Free  water 92ml q 4h)  Current Nutrition:  NPO and TPN  Pivot 1.5 at 30 ml/hr - plan for slow increase to goal Prosource TF 45 ml BID (each provides 40 kcal and 11 g protein)  Plan:  No change to TPN today given ongoing NGT output.  Continue TPN at goal rate of 95 mL/hr at 1800.  TPN will provide 137g protein and 2178 kCal, meeting 100% of patient needs Electrolytes in TPN: Decr Na to 30 meq/L Add standard MVI and trace elements + thiamine and folic acid per dietitian. Continue TCTS q4h SSI for now and adjust as needed Recommend increase Lantus to 15 units SQ bid Monitor TPN labs Mon/Thurs and prn F/u plans for extubation, toleration of TF/diet and ability to wean TPN  31m, PharmD, [facility] Clinical Pharmacist Please see AMION for all Pharmacists' Contact Phone Numbers 10/07/2020, 8:06 AM

## 2020-10-08 DIAGNOSIS — I509 Heart failure, unspecified: Secondary | ICD-10-CM

## 2020-10-08 DIAGNOSIS — J9601 Acute respiratory failure with hypoxia: Secondary | ICD-10-CM | POA: Diagnosis not present

## 2020-10-08 LAB — COOXEMETRY PANEL
Carboxyhemoglobin: 0.9 % (ref 0.5–1.5)
Methemoglobin: 0.7 % (ref 0.0–1.5)
O2 Saturation: 69 %
Total hemoglobin: 14.6 g/dL (ref 12.0–16.0)

## 2020-10-08 LAB — BASIC METABOLIC PANEL
Anion gap: 8 (ref 5–15)
BUN: 104 mg/dL — ABNORMAL HIGH (ref 8–23)
CO2: 25 mmol/L (ref 22–32)
Calcium: 7.7 mg/dL — ABNORMAL LOW (ref 8.9–10.3)
Chloride: 111 mmol/L (ref 98–111)
Creatinine, Ser: 2.1 mg/dL — ABNORMAL HIGH (ref 0.61–1.24)
GFR, Estimated: 32 mL/min — ABNORMAL LOW (ref >60)
Glucose, Bld: 192 mg/dL — ABNORMAL HIGH (ref 70–99)
Potassium: 4.2 mmol/L (ref 3.5–5.1)
Sodium: 144 mmol/L (ref 135–145)

## 2020-10-08 LAB — GLUCOSE, CAPILLARY
Glucose-Capillary: 112 mg/dL — ABNORMAL HIGH (ref 70–99)
Glucose-Capillary: 127 mg/dL — ABNORMAL HIGH (ref 70–99)
Glucose-Capillary: 147 mg/dL — ABNORMAL HIGH (ref 70–99)
Glucose-Capillary: 159 mg/dL — ABNORMAL HIGH (ref 70–99)
Glucose-Capillary: 190 mg/dL — ABNORMAL HIGH (ref 70–99)
Glucose-Capillary: 195 mg/dL — ABNORMAL HIGH (ref 70–99)
Glucose-Capillary: 214 mg/dL — ABNORMAL HIGH (ref 70–99)

## 2020-10-08 LAB — CBC
HCT: 33.1 % — ABNORMAL LOW (ref 39.0–52.0)
Hemoglobin: 10.3 g/dL — ABNORMAL LOW (ref 13.0–17.0)
MCH: 27.7 pg (ref 26.0–34.0)
MCHC: 31.1 g/dL (ref 30.0–36.0)
MCV: 89 fL (ref 80.0–100.0)
Platelets: 202 10*3/uL (ref 150–400)
RBC: 3.72 MIL/uL — ABNORMAL LOW (ref 4.22–5.81)
RDW: 17.7 % — ABNORMAL HIGH (ref 11.5–15.5)
WBC: 6.7 10*3/uL (ref 4.0–10.5)
nRBC: 0 % (ref 0.0–0.2)

## 2020-10-08 MED ORDER — QUETIAPINE FUMARATE 100 MG PO TABS
100.0000 mg | ORAL_TABLET | Freq: Two times a day (BID) | ORAL | Status: DC
  Administered 2020-10-08 – 2020-10-11 (×7): 100 mg
  Filled 2020-10-08 (×7): qty 1

## 2020-10-08 MED ORDER — IPRATROPIUM-ALBUTEROL 0.5-2.5 (3) MG/3ML IN SOLN
3.0000 mL | Freq: Three times a day (TID) | RESPIRATORY_TRACT | Status: DC
  Administered 2020-10-08 – 2020-10-09 (×6): 3 mL via RESPIRATORY_TRACT
  Filled 2020-10-08 (×6): qty 3

## 2020-10-08 MED ORDER — INSULIN GLARGINE 100 UNIT/ML ~~LOC~~ SOLN
5.0000 [IU] | Freq: Two times a day (BID) | SUBCUTANEOUS | Status: DC
  Administered 2020-10-08 – 2020-10-20 (×25): 5 [IU] via SUBCUTANEOUS
  Filled 2020-10-08 (×26): qty 0.05

## 2020-10-08 MED ORDER — FOLIC ACID 1 MG PO TABS
1.0000 mg | ORAL_TABLET | Freq: Every day | ORAL | Status: DC
  Administered 2020-10-09 – 2020-10-20 (×12): 1 mg
  Filled 2020-10-08 (×12): qty 1

## 2020-10-08 MED ORDER — FUROSEMIDE 10 MG/ML IJ SOLN
40.0000 mg | Freq: Two times a day (BID) | INTRAMUSCULAR | Status: AC
  Administered 2020-10-08 (×2): 40 mg via INTRAVENOUS
  Filled 2020-10-08 (×2): qty 4

## 2020-10-08 MED ORDER — THIAMINE HCL 100 MG PO TABS
100.0000 mg | ORAL_TABLET | Freq: Every day | ORAL | Status: DC
  Administered 2020-10-09 – 2020-10-20 (×12): 100 mg
  Filled 2020-10-08 (×11): qty 1

## 2020-10-08 NOTE — Progress Notes (Signed)
Patient ID: Jack Vance, male   DOB: 05/17/1941, 80 y.o.   MRN: [id number]    Advanced Heart Failure Rounding Note   Subjective:    Underwent emergent CABG on 5/6 for critical LM disease (LIMA -> LAD, SVG -> OM, SVG -> RCA) 5/6 Cultures negative.  5/8 Possible aspiration. A fib --> started amio drip.  Given 2u PRBCs. Hypotensive in the evening and started on vasopressin + antibiotics.  5/9 IABP removed 5/10 NG placed >1.5 out  5/11 started neostigmime 5/12 Febrile 102 5/17 Failed SBT. Underwent bronchoscopy for attempted therapeutic aspiration for mucus plugging. ETT exchanged.  5/19 Hgb 6.1 given 1UPRBC   Remains on vent. FiO2 40%.  Agitated this morning, on Precedex.    Off milrinone, co-ox 69%.  On hydralazine/isordil.  BUN 104/creatinine 2.1 (lower).  Good UOP but overall I/Os 900 cc positive.  Weight up. CVP about 15 though poor waveform.   Appears to be in Wenckebach block.     Objective:   Weight Range:  Vital Signs:   Temp:  [97.9 F (36.6 C)-99.2 F (37.3 C)] 97.9 F (36.6 C) (05/21 0700) Pulse Rate:  [74-81] 76 (05/21 0801) Resp:  [21-34] 32 (05/21 0801) BP: (100-142)/(41-75) 129/55 (05/21 0700) SpO2:  [98 %-99 %] 99 % (05/21 0801) FiO2 (%):  [40 %] 40 % (05/21 0801) Weight:  [81.5 kg] 81.5 kg (05/21 0500) Last BM Date: 10/07/20  Weight change: Filed Weights   10/06/20 0500 10/07/20 0400 10/08/20 0500  Weight: 78.7 kg 79.8 kg 81.5 kg    Intake/Output:   Intake/Output Summary (Last 24 hours) at 10/08/2020 0857 Last data filed at 10/08/2020 0816 Gross per 24 hour  Intake 6709 ml  Output 5620 ml  Net 1089 ml     PHYSICAL EXAM: CVP 15   General: Vent Neck: JVP 12 cm, no thyromegaly or thyroid nodule.  Lungs: Decreased bases.  CV: Nondisplaced PMI.  Heart irregular S1/S2, no S3/S4, no murmur.  No peripheral edema.   Abdomen: Soft, nontender, no hepatosplenomegaly, no distention.  Skin: Intact without lesions or rashes.  Neurologic:  Agitated. Extremities: No clubbing or cyanosis.  HEENT: Normal.    Telemetry: Sinus with Wenckebach periodicity (personally reviewed)  Labs: Basic Metabolic Panel: Recent Labs  Lab 10/01/20 1307 10/02/20 0355 10/03/20 0424 10/04/20 0741 10/05/20 0503 10/06/20 0456 10/07/20 0327 10/07/20 1428 10/08/20 0338  NA 143   < > 147*   < > 144 145 147* 149* 144  K 3.4*   < > 3.7   < > 4.5 3.9 4.3 4.0 4.2  CL 100   < > 112*   < > 116* 118* 118* 118* 111  CO2 34*   < > 29   < > 21* _0 GLUCOSE 174*   < > 160*   < > 305* 227* 159* 132* 192*  BUN 85*   < > 100*   < > 121* 127* 116* 112* 104*  CREATININE 2.58*   < > 2.48*   < > 2.70* 2.51* 2.36* 2.32* 2.10*  CALCIUM 8.5*   < > 8.5*   < > 7.5* 7.3* 7.7* 7.7* 7.7*  MG 2.1  --  2.2  --   --  2.6* 2.5*  --   --   PHOS  --   --  1.5*  --   --  3.2  --   --   --    < > = values in this interval not displayed.  Liver Function Tests: Recent Labs  Lab 10/03/20 0424 10/04/20 0741 10/06/20 0456  AST 42* 38 25  ALT _0 ALKPHOS 72 65 46  BILITOT 1.0 1.1 0.7  PROT 5.4* 6.1* 5.0*  ALBUMIN 1.7* 1.9* 1.5*   No results for input(s): LIPASE, AMYLASE in the last 168 hours. No results for input(s): AMMONIA in the last 168 hours.  CBC: Recent Labs  Lab 10/03/20 0424 10/04/20 0444 10/05/20 0350 10/06/20 0320 10/06/20 0434 10/06/20 2155 10/07/20 0327 10/08/20 0338  WBC 12.1*   < > 8.5 5.9 6.3  --  7.1 6.7  NEUTROABS 9.5*  --   --   --   --   --   --   --   HGB 8.2*   < > 7.2* 5.9* 6.1* 8.0* 8.1* 10.3*  HCT 26.9*   < > 23.4* 18.2* 20.0* 25.6* 25.5* 33.1*  MCV 87.1   < > 109.9* 103.4* 97.1  --  89.2 89.0  PLT 181   < > 177 155 182  --  211 202   < > = values in this interval not displayed.    Cardiac Enzymes: No results for input(s): CKTOTAL, CKMB, CKMBINDEX, TROPONINI in the last 168 hours.  BNP: BNP (last 3 results) Recent Labs    09/23/20 0311  BNP 1,191.8*    ProBNP (last 3 results) No results for  input(s): PROBNP in the last 8760 hours.    Other results:  Imaging: CT HEAD WO CONTRAST  Result Date: 10/06/2020 CLINICAL DATA:  80 year old male with history of delirium. Cardiogenic shock. EXAM: CT HEAD WITHOUT CONTRAST TECHNIQUE: Contiguous axial images were obtained from the base of the skull through the vertex without intravenous contrast. COMPARISON:  No priors. FINDINGS: Brain: Mild cerebral atrophy. Patchy and confluent areas of decreased attenuation are noted throughout the deep and periventricular white matter of the cerebral hemispheres bilaterally, compatible with chronic microvascular ischemic disease. No evidence of acute infarction, hemorrhage, hydrocephalus, extra-axial collection or mass lesion/mass effect. Vascular: No hyperdense vessel or unexpected calcification. Skull: Normal. Negative for fracture or focal lesion. Sinuses/Orbits: Multifocal mucosal thickening throughout the paranasal sinuses with some air-fluid levels noted in the sphenoid sinuses bilaterally. Status post bilateral maxillary antrectomy. Other: None. IMPRESSION: 1. No acute intracranial abnormalities. 2. Mild cerebral atrophy with chronic microvascular ischemic changes in the cerebral white matter, as above. 3. Changes of chronic sinus disease with air-fluid levels in the sphenoid sinuses bilaterally, which could indicate acute sinus infection as well. Electronically Signed   By: Vinnie Langton M.D.   On: 10/06/2020 11:52     Medications:     Scheduled Medications: . amiodarone  200 mg Per Tube BID  . aspirin  324 mg Per Tube Daily  . atorvastatin  80 mg Per Tube Daily  . chlorhexidine gluconate (MEDLINE KIT)  15 mL Mouth Rinse BID  . Chlorhexidine Gluconate Cloth  6 each Topical Daily  . clonazePAM  1 mg Per Tube BID  . feeding supplement (PIVOT 1.5 CAL)  1,000 mL Per Tube Q24H  . [START ON 0/26/3785] folic acid  1 mg Per Tube Daily  . free water  150 mL Per Tube Q4H  . furosemide  40 mg Intravenous  BID  . hydrALAZINE  25 mg Per Tube Q8H  . insulin aspart  0-24 Units Subcutaneous Q4H  . insulin aspart  6 Units Subcutaneous Q4H  . insulin glargine  5 Units Subcutaneous BID  . ipratropium-albuterol  3 mL Nebulization TID  .  isosorbide dinitrate  10 mg Per Tube TID  . mouth rinse  15 mL Mouth Rinse 10 times per day  . metoCLOPramide (REGLAN) injection  5 mg Intravenous Q6H  . pantoprazole (PROTONIX) IV  40 mg Intravenous Q24H  . polyethylene glycol  17 g Per Tube BID  . QUEtiapine  50 mg Per Tube BID  . rOPINIRole  3 mg Per Tube TID  . sodium chloride flush  10-40 mL Intracatheter Q12H  . sodium chloride flush  3 mL Intravenous Q12H  . [START ON 10/09/2020] thiamine  100 mg Per Tube Daily  . valproic acid  250 mg Per Tube BID    Infusions: . dexmedetomidine (PRECEDEX) IV infusion 0.5 mcg/kg/hr (10/08/20 0800)  . dextrose 100 mL/hr at 10/08/20 0800  . fentaNYL infusion INTRAVENOUS Stopped (10/01/20 0024)  . lactated ringers Stopped (10/01/20 0607)    PRN Medications: acetaminophen (TYLENOL) oral liquid 160 mg/5 mL, albuterol, fentaNYL (SUBLIMAZE) injection, lip balm, metoprolol tartrate, midazolam, ondansetron (ZOFRAN) IV, sodium chloride flush, sodium chloride flush, sorbitol   Assessment/Plan:   1. 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  - Co-ox 69% off milrinone.   - CVP 15 with weight up, responded well to 1 dose IV Lasix yesterday. Lasix 40 mg IV bid today.  - No ACE/ARB/ARNI/MRA with AKI. No b-blocker yet with shock.  - c/w hydral/nitrates for afterload reduction   2. NSTEMI with critical CAD - 95% LM and high grade ostial RCA - CABG on 5/6 for critical LM disease (LIMA -> LAD, SVG -> OM, SVG -> RCA) - Continue ASA.  - Continue statin.  - Add Plavix soon   3. Acute hypoxic respiratory failure due  to pulmonary edema - on vent - Possible aspiration 5/8 & 5/10  - 5/6 Blood CX - NG  - Antibiotics completed 5/16 - CCM following - Likely will need trach next week.   4. AKI / Uremia  - due to ATN - Cr peaked 3.5->2.4->2.1 - BUN elevated 104 but likely due to protein load in TPN.  - Gradually improving - Support hemodynamics.   5. Ventral Hernia  - 5/10 NG placed to decompress   6. ID - Finished zosyn for possible aspiration 5/16.  - Blood CX 5/6- NGTD, UC 5/13 NGTD  - Afebrile with normal WBCs.  7. Anemia - Given 1IPRBC 5/19. - Hgb up from 6.1>8.1>10.3.   8. Hypernatremia Getting free water.   9. Atrial fibrillation - Transient, appears sinus today with Wenckebach periodicity.  Will get ECG.  - Continue po amiodarone for now.    Length of Stay: [place] Performed by: Loralie Champagne  Total critical care time: 35 minutes  Critical care time was exclusive of separately billable procedures and treating other patients.  Critical care was necessary to treat or prevent imminent or life-threatening deterioration.  Critical care was time spent personally by me (independent of midlevel providers or residents) on the following activities: development of treatment plan with patient and/or surrogate as well as nursing, discussions with consultants, evaluation of patient's response to treatment, examination of patient, obtaining history from patient or surrogate, ordering and performing treatments and interventions, ordering and review of laboratory studies, ordering and review of radiographic studies, pulse oximetry and re-evaluation of patient's condition.  Loralie Champagne, MD  8:57 AM

## 2020-10-08 NOTE — Progress Notes (Signed)
15 Days Post-Op Procedure(s) (LRB): CORONARY ARTERY BYPASS GRAFTING (CABG)  TIMES 4 USING LEFT GREATER SAPHENOUS VEIN HARVESTED ENDOSCOPICALLY AND LEFT INTERNAL MAMMARY ARTERY (N/A) TRANSESOPHAGEAL ECHOCARDIOGRAM (TEE) (N/A) Subjective: Intubated and sedated on vent but eyes open and moving around. Follows some commands for me.  Objective: Vital signs in last 24 hours: Temp:  [98.2 F (36.8 C)-99.2 F (37.3 C)] 98.8 F (37.1 C) (05/21 0400) Pulse Rate:  [74-81] 74 (05/21 0345) Cardiac Rhythm: Heart block (05/21 0400) Resp:  [21-36] 24 (05/21 0700) BP: (100-142)/(41-75) 129/55 (05/21 0700) SpO2:  [98 %-99 %] 98 % (05/21 0700) FiO2 (%):  [40 %] 40 % (05/21 0400) Weight:  [81.5 kg] 81.5 kg (05/21 0500)  Hemodynamic parameters for last 24 hours: CVP:  [6 mmHg-13 mmHg] 9 mmHg  Intake/Output from previous day: 05/20 0701 - 05/21 0700 In: 6617.3 [I.V.:4847.3; NG/GT:1770] Out: 5720 [Urine:4920; Emesis/NG output:800] Intake/Output this shift: No intake/output data recorded.  General appearance: intubated on vent. moving all extremities well. Neurologic: intact and follows some commands Heart: regular rate and rhythm Lungs: clear to auscultation bilaterally Abdomen: soft, non-tender; bowel sounds normal, protuberant ventral hernia  Extremities: edema minimal Wound: incision healing.  Lab Results: Recent Labs    10/07/20 0327 10/08/20 0338  WBC 7.1 6.7  HGB 8.1* 10.3*  HCT 25.5* 33.1*  PLT 211 202   BMET:  Recent Labs    10/07/20 1428 10/08/20 0338  NA 149* 144  K 4.0 4.2  CL 118* 111  CO2 25 25  GLUCOSE 132* 192*  BUN 112* 104*  CREATININE 2.32* 2.10*  CALCIUM 7.7* 7.7*    PT/INR: No results for input(s): LABPROT, INR in the last 72 hours. ABG    Component Value Date/Time   PHART 7.410 09/26/2020 0539   HCO3 19.9 (L) 09/26/2020 0539   TCO2 21 (L) 09/26/2020 0539   ACIDBASEDEF 4.0 (H) 09/26/2020 0539   O2SAT 69.0 10/08/2020 0338   CBG (last 3)  Recent  Labs    10/07/20 2015 10/08/20 0049 10/08/20 0337  GLUCAP 179* 214* 190*    Assessment/Plan: S/P Procedure(s) (LRB): CORONARY ARTERY BYPASS GRAFTING (CABG)  TIMES 4 USING LEFT GREATER SAPHENOUS VEIN HARVESTED ENDOSCOPICALLY AND LEFT INTERNAL MAMMARY ARTERY (N/A) TRANSESOPHAGEAL ECHOCARDIOGRAM (TEE) (N/A)  POD 15 Hemodynamically stable in sinus rhythm on amio per tube. Co-ox 69% off milrinone.  VDRF: weaning per CCM. Plan trach Monday if no progress over weekend.  AKI/ prerenal azotemia with hypernatremia: nephrology following. Excellent urine output. Na, BUN and creat improving. Wt is increasing but still below preop. Getting free water per tube in addition to D5W IV.  Tolerating TF at goal. Will stop TNA. Glucose under adequate control.  CXR in am.    LOS: 15 days    Jack Vance 10/08/2020

## 2020-10-08 NOTE — Progress Notes (Signed)
[facility] Progress Note    Assessment/ Plan:   1. AKI, prerenal azotemia on the setting of cardiogenic shock 2. At risk for ATN  Baseline creatinine 1 likely with ATN and possible cholesterol embolic injury.  Creatinine and BUN are improving slowly.  Unclear where his kidney function will settle out as cholesterol embolic injury sometimes does not return to normal.  However, given his improvement we will sign off at this time. -Continue to monitor creatinine trend -BUN elevation out of proportion to creatinine likely related to TPN and possible oozing GI bleeding -Given AMS likely multifactorial and his poor dialysis candidacy would not do dialysis empirically -diuretic management per primary team -We will sign off at this time.  Please let us know if further assistance is needed   2.  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  Acute systolic heart failure complicated by cardiogenic shock: -Continue with guideline directed medical therapy as deemed by primary team  4.  Acute respiratory failure secondary to possible aspiration pneumonia - Continue Zosyn for possible aspiration pneumonia - Continue vent management per PCCM  5. Hypernatremia: High NG tube output making free water flushes and ins difficult.  Also came very precipitated by high urea load (as with TPN) causing high urine output.  Sodium improved today.  Continue D5 water at 100 and adjust as needed to achieve goal.  Switch to free water flushes as tolerated  Subjective:   Patient lying in bed unable to communicate any complaints.  Creatinine down to 2.1 with great urine output of 4.6 L.   Objective:   BP (!) 129/55   Pulse 76   Temp 97.9 F (36.6 C) (Axillary)   Resp (!) 32   Ht _0  (1.676 m)   Wt 81.5 kg   SpO2 99%   BMI 29.00 kg/m   Intake/Output Summary (Last 24 hours) at 10/08/2020 1610 Last data filed at 10/08/2020  9604 Gross per 24 hour  Intake 6709 ml  Output 5895 ml  Net 814 ml   Weight change: 1.7 kg  Physical Exam:  GEN: Ill-appearing, lying in bed, agitated ENT: [employer] material leaving via NG tube, dry lips EYES: no scleral icterus, eomi CV: normal rate, systolic murmur present PULM: Coarse bilateral breath sounds, ventilated, no increased work of breathing ABD: NABS, non-distended SKIN: no rashes or jaundice EXT: Pitting edema present in the bilateral upper and lower extremities, warm and well perfused  Physical Exam Constitutional:      Appearance: He is ill-appearing and diaphoretic.  HENT:     Head: Normocephalic and atraumatic.     Mouth/Throat:     Comments: ET tube in place   Cardiovascular:     Rate and Rhythm: Normal rate and regular rhythm.     Pulses: Normal pulses.     Heart sounds: Murmur heard.    Pulmonary:     Breath sounds: Rales (bilateral) present.     Comments: Course upper respiratory sounds Abdominal:     General: Bowel sounds are normal. There is distension.     Palpations: Abdomen is soft.     Hernia: A hernia is present.  Musculoskeletal:        General: Swelling (upper and lower extremity pitting edema) present.  Skin:    General: Skin is warm.  Neurological:     Comments: Following commands      Imaging: CT HEAD WO CONTRAST  Result Date: 10/06/2020  CLINICAL DATA:  80 year old male with history of delirium. Cardiogenic shock. EXAM: CT HEAD WITHOUT CONTRAST TECHNIQUE: Contiguous axial images were obtained from the base of the skull through the vertex without intravenous contrast. COMPARISON:  No priors. FINDINGS: Brain: Mild cerebral atrophy. Patchy and confluent areas of decreased attenuation are noted throughout the deep and periventricular white matter of the cerebral hemispheres bilaterally, compatible with chronic microvascular ischemic disease. No evidence of acute infarction, hemorrhage, hydrocephalus, extra-axial collection or mass  lesion/mass effect. Vascular: No hyperdense vessel or unexpected calcification. Skull: Normal. Negative for fracture or focal lesion. Sinuses/Orbits: Multifocal mucosal thickening throughout the paranasal sinuses with some air-fluid levels noted in the sphenoid sinuses bilaterally. Status post bilateral maxillary antrectomy. Other: None. IMPRESSION: 1. No acute intracranial abnormalities. 2. Mild cerebral atrophy with chronic microvascular ischemic changes in the cerebral white matter, as above. 3. Changes of chronic sinus disease with air-fluid levels in the sphenoid sinuses bilaterally, which could indicate acute sinus infection as well. Electronically Signed   By: Vinnie Langton M.D.   On: 10/06/2020 11:52    Labs: BMET Recent Labs  Lab 10/03/20 0424 10/04/20 0741 10/04/20 1356 10/05/20 0503 10/06/20 0456 10/07/20 0327 10/07/20 1428 10/08/20 0338  NA 147* 152* 151* 144 145 147* 149* 144  K 3.7 4.7 4.9 4.5 3.9 4.3 4.0 4.2  CL 112* 121* 120* 116* 118* 118* 118* 111  CO2 _0 21* _1 GLUCOSE 160* 176* 212* 305* 227* 159* 132* 192*  BUN 100* 115* 123* 121* 127* 116* 112* 104*  CREATININE 2.48* 2.70* 2.71* 2.70* 2.51* 2.36* 2.32* 2.10*  CALCIUM 8.5* 8.1* 7.8* 7.5* 7.3* 7.7* 7.7* 7.7*  PHOS 1.5*  --   --   --  3.2  --   --   --    CBC Recent Labs  Lab 10/03/20 0424 10/04/20 0444 10/06/20 0320 10/06/20 0434 10/06/20 2155 10/07/20 0327 10/08/20 0338  WBC 12.1*   < > 5.9 6.3  --  7.1 6.7  NEUTROABS 9.5*  --   --   --   --   --   --   HGB 8.2*   < > 5.9* 6.1* 8.0* 8.1* 10.3*  HCT 26.9*   < > 18.2* 20.0* 25.6* 25.5* 33.1*  MCV 87.1   < > 103.4* 97.1  --  89.2 89.0  PLT 181   < > 155 182  --  211 202   < > = values in this interval not displayed.    Medications:    . amiodarone  200 mg Per Tube BID  . aspirin  324 mg Per Tube Daily  . atorvastatin  80 mg Per Tube Daily  . chlorhexidine gluconate (MEDLINE KIT)  15 mL Mouth Rinse BID  . Chlorhexidine Gluconate  Cloth  6 each Topical Daily  . clonazePAM  1 mg Per Tube BID  . feeding supplement (PIVOT 1.5 CAL)  1,000 mL Per Tube Q24H  . [START ON 9/81/1914] folic acid  1 mg Per Tube Daily  . free water  150 mL Per Tube Q4H  . hydrALAZINE  25 mg Per Tube Q8H  . insulin aspart  0-24 Units Subcutaneous Q4H  . insulin aspart  6 Units Subcutaneous Q4H  . insulin glargine  5 Units Subcutaneous BID  . ipratropium-albuterol  3 mL Nebulization TID  . isosorbide dinitrate  10 mg Per Tube TID  . mouth rinse  15 mL Mouth Rinse 10 times per day  . metoCLOPramide (REGLAN)  injection  5 mg Intravenous Q6H  . pantoprazole (PROTONIX) IV  40 mg Intravenous Q24H  . polyethylene glycol  17 g Per Tube BID  . QUEtiapine  50 mg Per Tube BID  . rOPINIRole  3 mg Per Tube TID  . sodium chloride flush  10-40 mL Intracatheter Q12H  . sodium chloride flush  3 mL Intravenous Q12H  . [START ON 10/09/2020] thiamine  100 mg Per Tube Daily  . valproic acid  250 mg Per Tube BID    8:34 AM, 10/08/2020

## 2020-10-08 NOTE — Progress Notes (Addendum)
NAME:  Jack Vance, MRN:  [id number], DOB:  08/31/1940, LOS: 15 ADMISSION DATE:  09/23/2020, CONSULTATION DATE:  09/24/2019 REFERRING MD:  Jacinto Halim, CHIEF COMPLAINT:  Acute coronary syndrome   History of Present Illness:  80 year old man who presented with cardiogenic shock secondary to NSTEMI.  Presented with new onset RSCP and severe dyspnea. Found to have acute pulmonary edema and heart failure; intubated and sent for urgent LHC which revealed 3V CAD.  IABP inserted and patient underwent urgent CABG. IABP removed 5/9. Continues to require full mechanical ventilation support.  Pertinent  Medical History   Past Medical History:  Diagnosis Date  . Arthritis   . Asthma   . DDD (degenerative disc disease), lumbar   . Hernia of abdominal wall   . RLS (restless legs syndrome)    Significant Hospital Events: Including procedures, antibiotic start and stop dates in addition to other pertinent events   . 5/6 CABG with Dr Cliffton Asters with LIMA to LAD, SVG to RCA d and SVG to ramus. LV normal and returned to ICU with no vasopressor support.  . No post-operative bleeding but poor RV performance on hemodynamics.  . 5/8 significant hemodynamic instability with labile blood pressure and vasopressor requirements yesterday.  Episode of emesis but no evidence of bowel obstruction.  Evidence of pericardial tamponade. . 5/9 improved hemodynamic, able now to tolerate balloon pump weaning and removal. . 5/11 Stable hemodynamics, continuing diuresis. Improved UOP. TPN consult for initiation 5/12 in the setting of ileus, prolonged NPO status.  . 5/13 sedated, diuresis, milrinone, PICC line today, CVL removed  . 5/14 tolerated short SBT/SAT on Precedex . 5/16 Not tolerating SBT due to significant tachypnea to 50s, placed back on PRVC. Marland Kitchen 5/17 Improved SBT toleration, weaning milrinone, minimizing sedation. Bronched w/ c/f mucus plugging, airways clear, ETT exchanged due to significant buildup. Diuresis  held. . 5/18 More agitated overnight requiring increased sedation with addition of Fentanyl/Versed, Seroquel started. BUN continues to rise. Nephro consult.   5/19 Continues to be agitated overnight requiring increased sedation with fentanyl/versed. Started Klonopin, Seroquel and Requip yesterday. Started on valproate and seroquel increased.  5/20 Patient more interactive following commands and maintaining eyes open and tracking.  5/21 failing SBT's.  Plan for trach next week  Interim History / Subjective:   Failing SBT's, continues milrinone, Precedex  Objective   Blood pressure (!) 129/55, pulse 76, temperature 97.9 F (36.6 C), temperature source Axillary, resp. rate (!) 32, height 5\' 6"  (1.676 m), weight 81.5 kg, SpO2 99 %. CVP:  [6 mmHg-13 mmHg] 9 mmHg  Vent Mode: PRVC FiO2 (%):  [40 %] 40 % Set Rate:  [22 bmp] 22 bmp Vt Set:  [560 mL] 560 mL PEEP:  [5 cmH20] 5 cmH20 Plateau Pressure:  [19 cmH20-26 cmH20] 26 cmH20   Intake/Output Summary (Last 24 hours) at 10/08/2020 10/10/2020 Last data filed at 10/08/2020 0900 Gross per 24 hour  Intake 6736.07 ml  Output 5700 ml  Net 1036.07 ml   Filed Weights   10/06/20 0500 10/07/20 0400 10/08/20 0500  Weight: 78.7 kg 79.8 kg 81.5 kg   Physical Examination: Gen:      No acute distress HEENT:  EOMI, sclera anicteric Neck:     No masses; no thyromegaly, ETT Lungs:    Clear to auscultation bilaterally; normal respiratory effort CV:         Regular rate and rhythm; no murmurs Abd:      Large ventral hernia Ext:    No edema; adequate  peripheral perfusion Skin:      Warm and dry; no rash Neuro: Sedated  Labs/imaging that I have personally reviewed: (right click and "Reselect all SmartList Selections" daily)   Labs are stable  Resolved Hospital Problem List   NSTEMI, Thrombocytopenia  Assessment & Plan:   Acute hypoxemic hypercapnic respiratory failure requiring mechanical ventilation. Prolonged vent weaning due to NM  weakness. Possible aspiration Keep supported on the vent, follow intermittent chest X ray Continue daily SAT/SBT. Plan for trach Monday 5/23. Monitoring off antibiotics  Critically ill due to RV dysfunction following ACS and cardiopulmonary bypass  Cardiogenic shock NSTEMI with culprit RCA involvement Coronary artery disease Continue amiodarone, diuresis with Lasix Hydralazine and isosorbide for afterload reduction  Acute kidney injury Uremia Hypernatremia Renal function is improving.  Nephrology has signed off  Gastric ileus, improved Increasing abdominal distention and hypoactive bowel sounds. -Tube feeds are at goal.  Will stop TPN later today  Anemia (Baseline ~10) No evidence of active bleed.  - Continue to monitor  Agitated delirium controlled H/o alcohol abuse - Delirium precautions - Seroquel, klonopin, valproate.  Increase Seroquel dose - Supplemental vitamins via TPN  RLS - Continue home Requip  DM - Continue Lantus, novolog, SSI and CBG  Best Practice: (right click and "Reselect all SmartList Selections" daily)  Diet:  Tube Feed  + TPN Pain/Anxiety/Delirium protocol (if indicated): Yes (RASS goal 0) to -1 VAP protocol (if indicated): Yes DVT prophylaxis: Subcutaneous Heparin- held given low Hgb GI prophylaxis: PPI Glucose control:  SSI Yes, lantus, novolog Central venous access:  Yes, and it is still needed Arterial line:  Yes, and it is still needed Foley:  Yes, and it is still needed Mobility:  bed rest  PT consulted: N/A Last date of multidisciplinary goals of care discussion [Per Primary] Code Status:  full code Disposition: ICU  Critical care time:    The patient is critically ill with multiple organ system failure and requires high complexity decision making for assessment and support, frequent evaluation and titration of therapies, advanced monitoring, review of radiographic studies and interpretation of complex data.   Critical Care Time  devoted to patient care services, exclusive of separately billable procedures, described in this note is 45 minutes.   Chilton Greathouse MD [facility] See Amion for pager  If no response to pager , please call [phone] until 7pm After 7:00 pm call Elink  [phone] 10/08/2020, 9:54 AM

## 2020-10-08 NOTE — Progress Notes (Signed)
Tube holder changed 

## 2020-10-08 NOTE — Progress Notes (Signed)
PHARMACY - TOTAL PARENTERAL NUTRITION CONSULT NOTE   Indication: Prolonged ileus  Patient Measurements: Height: 5\' 6"  (167.6 cm) Weight: 81.5 kg (179 lb 10.8 oz) IBW/kg (Calculated) : 63.8 TPN AdjBW (KG): 68.6 Body mass index is 29 kg/m.  Assessment: 80 y/o M presents with cardiogenic shock secondary to NSTEMI, s/p urgent cath revealing 3V CAD and subsequent emergent CABG. Patient with ileus post-op with prolonged NPO status. Cortrak placed 5/11. Trickle tube were attempted x 2 (5/7-5/8, 5/9-5/11) and held both times with vomiting. Pharmacy consulted to start TPN.  Glucose / Insulin: No hx DM. A1c 6.3% (09/25/20). CBGs 140-215. Insulin drip off, Insulin: 50 units SSI + Lantus 15 units bid in last 24hrs, D5W@100 , TF advanced to goal rate Electrolytes: Na 144, Cl 111, K 4.2 (goal >/=4 with ileus), Phos 3.2, Mag 2.5, others WNL Renal: AKI - Scr down 2.1 << 2.36 (SCr 1 on 5/6), BUN down to 104. Lasix drip stopped 5/14 Hepatic: LFTs WNL, TG down 162. Albumin 1.5, prealbumin up 14.7 Intake / Output; MIVF: UOP 2.5 ml/kg/hr, NGT output 800 cc/24h, +BM noted 5/20, now on daily bisacodyl supp -  Pivot 1.5 TF at 2ml/hr, Prosource 60ml BID,  GI Imaging: none since TPN start GI Surgeries / Procedures: none since TPN start Central access: CVC triple lumen placed 09/24/20 (removed 5/13); triple lumen PICC placed 09/30/20 TPN start date: 09/29/20  Nutritional Goals (per RD recommendation on 10/06/20): kCal: 10/08/20, Protein: 135-180g, Fluid: >/=2 L/day  - Goal TPN rate is 95 mL/hr (provides 137 g of protein and 2178 kcals per day) - Tube feed regimen when at goal provides 2240 kcal and 157 g protein (Pivot 1.5 at 51ml/hr + Prosourse 38ml bid + Free water 28ml q 4h)  Current Nutrition:  NPO and TPN  Pivot 1.5 at goal rate of 60 ml/hr Prosource TF 45 ml BID (each provides 40 kcal and 11 g protein)  Plan:  - Will plan to discontinue TPN with TF advancement to goal rate (already stopped by CVTS this  AM) - Given stop of TPN and likely d/c of D5W (ordered by renal) with normalization of sodium - will plan to reduce lantus to 5 units bid  - Will adjust thiamine and folic acid to be given via tube instead of TPN - Will d/c TPN labs - Will plan to sign off of consult - please re-consult 31m if further TPN is needed  Thank you for allowing pharmacy to be a part of this patient's care.  [place], PharmD, BCPS Clinical Pharmacist Clinical phone for 10/08/2020: [phone] 10/08/2020 7:57 AM   **Pharmacist phone directory can now be found on [email or web address] ([email or web address]).  Listed under [facility].

## 2020-10-08 NOTE — Progress Notes (Signed)
Patient ID: Jack Vance, male   DOB: 06-Apr-1941, 80 y.o.   MRN: [id number]  TCTS Evening Rounds:   Hemodynamically stable   Restless on vent. Seroquel increased.  Urine output good    CBC    Component Value Date/Time   WBC 6.7 10/08/2020 0338   RBC 3.72 (L) 10/08/2020 0338   HGB 10.3 (L) 10/08/2020 0338   HCT 33.1 (L) 10/08/2020 0338   PLT 202 10/08/2020 0338   MCV 89.0 10/08/2020 0338   MCH 27.7 10/08/2020 0338   MCHC 31.1 10/08/2020 0338   RDW 17.7 (H) 10/08/2020 0338   LYMPHSABS 1.2 10/03/2020 0424   MONOABS 1.1 (H) 10/03/2020 0424   EOSABS 0.2 10/03/2020 0424   BASOSABS 0.1 10/03/2020 0424     BMET    Component Value Date/Time   NA 144 10/08/2020 0338   K 4.2 10/08/2020 0338   CL 111 10/08/2020 0338   CO2 25 10/08/2020 0338   GLUCOSE 192 (H) 10/08/2020 0338   BUN 104 (H) 10/08/2020 0338   CREATININE 2.10 (H) 10/08/2020 0338   CALCIUM 7.7 (L) 10/08/2020 0338   GFRNONAA 32 (L) 10/08/2020 0338   GFRAA >60 12/08/2017 0510     A/P:  Continue present care.

## 2020-10-09 ENCOUNTER — Inpatient Hospital Stay (HOSPITAL_COMMUNITY): Payer: No Typology Code available for payment source

## 2020-10-09 DIAGNOSIS — I5043 Acute on chronic combined systolic (congestive) and diastolic (congestive) heart failure: Secondary | ICD-10-CM

## 2020-10-09 DIAGNOSIS — I509 Heart failure, unspecified: Secondary | ICD-10-CM | POA: Diagnosis not present

## 2020-10-09 LAB — CBC
HCT: 27.2 % — ABNORMAL LOW (ref 39.0–52.0)
Hemoglobin: 8.5 g/dL — ABNORMAL LOW (ref 13.0–17.0)
MCH: 27.8 pg (ref 26.0–34.0)
MCHC: 31.3 g/dL (ref 30.0–36.0)
MCV: 88.9 fL (ref 80.0–100.0)
Platelets: 233 10*3/uL (ref 150–400)
RBC: 3.06 MIL/uL — ABNORMAL LOW (ref 4.22–5.81)
RDW: 17.4 % — ABNORMAL HIGH (ref 11.5–15.5)
WBC: 8.3 10*3/uL (ref 4.0–10.5)
nRBC: 0 % (ref 0.0–0.2)

## 2020-10-09 LAB — BASIC METABOLIC PANEL
Anion gap: 6 (ref 5–15)
BUN: 92 mg/dL — ABNORMAL HIGH (ref 8–23)
CO2: 27 mmol/L (ref 22–32)
Calcium: 7.6 mg/dL — ABNORMAL LOW (ref 8.9–10.3)
Chloride: 105 mmol/L (ref 98–111)
Creatinine, Ser: 1.86 mg/dL — ABNORMAL HIGH (ref 0.61–1.24)
GFR, Estimated: 37 mL/min — ABNORMAL LOW (ref >60)
Glucose, Bld: 158 mg/dL — ABNORMAL HIGH (ref 70–99)
Potassium: 4 mmol/L (ref 3.5–5.1)
Sodium: 138 mmol/L (ref 135–145)

## 2020-10-09 LAB — GLUCOSE, CAPILLARY
Glucose-Capillary: 156 mg/dL — ABNORMAL HIGH (ref 70–99)
Glucose-Capillary: 142 mg/dL — ABNORMAL HIGH (ref 70–99)
Glucose-Capillary: 105 mg/dL — ABNORMAL HIGH (ref 70–99)
Glucose-Capillary: 110 mg/dL — ABNORMAL HIGH (ref 70–99)
Glucose-Capillary: 121 mg/dL — ABNORMAL HIGH (ref 70–99)

## 2020-10-09 LAB — COOXEMETRY PANEL
Carboxyhemoglobin: 1.1 % (ref 0.5–1.5)
Methemoglobin: 1 % (ref 0.0–1.5)
O2 Saturation: 81 %
Total hemoglobin: 7.6 g/dL — ABNORMAL LOW (ref 12.0–16.0)

## 2020-10-09 MED ORDER — IPRATROPIUM-ALBUTEROL 0.5-2.5 (3) MG/3ML IN SOLN
3.0000 mL | Freq: Two times a day (BID) | RESPIRATORY_TRACT | Status: DC
  Administered 2020-10-10 – 2020-10-13 (×8): 3 mL via RESPIRATORY_TRACT
  Filled 2020-10-09 (×8): qty 3

## 2020-10-09 MED ORDER — FUROSEMIDE 10 MG/ML IJ SOLN
40.0000 mg | Freq: Two times a day (BID) | INTRAMUSCULAR | Status: AC
  Administered 2020-10-09 (×2): 40 mg via INTRAVENOUS
  Filled 2020-10-09 (×2): qty 4

## 2020-10-09 MED ORDER — DEXTROSE 5 % IV SOLN: INTRAVENOUS | Status: DC

## 2020-10-09 NOTE — Progress Notes (Signed)
Patient ID: Jack Vance, male   DOB: 1940/12/11, 80 y.o.   MRN: [id number]  TCTS Evening Rounds:   Hemodynamically stable   Remains on vent: no progress with weaning this weekend. Needs trach.  Urine output good    CBC    Component Value Date/Time   WBC 8.3 10/09/2020 0450   RBC 3.06 (L) 10/09/2020 0450   HGB 8.5 (L) 10/09/2020 0450   HCT 27.2 (L) 10/09/2020 0450   PLT 233 10/09/2020 0450   MCV 88.9 10/09/2020 0450   MCH 27.8 10/09/2020 0450   MCHC 31.3 10/09/2020 0450   RDW 17.4 (H) 10/09/2020 0450   LYMPHSABS 1.2 10/03/2020 0424   MONOABS 1.1 (H) 10/03/2020 0424   EOSABS 0.2 10/03/2020 0424   BASOSABS 0.1 10/03/2020 0424     BMET    Component Value Date/Time   NA 138 10/09/2020 0450   K 4.0 10/09/2020 0450   CL 105 10/09/2020 0450   CO2 27 10/09/2020 0450   GLUCOSE 158 (H) 10/09/2020 0450   BUN 92 (H) 10/09/2020 0450   CREATININE 1.86 (H) 10/09/2020 0450   CALCIUM 7.6 (L) 10/09/2020 0450   GFRNONAA 37 (L) 10/09/2020 0450   GFRAA >60 12/08/2017 0510     A/P:  Stable day. Will discuss trach plans with HL.

## 2020-10-09 NOTE — Progress Notes (Signed)
Patient ID: Jack Vance, male   DOB: 1940/10/07, 80 y.o.   MRN: [id number]    Advanced Heart Failure Rounding Note   Subjective:    Underwent emergent CABG on 5/6 for critical LM disease (LIMA -> LAD, SVG -> OM, SVG -> RCA) 5/6 Cultures negative.  5/8 Possible aspiration. A fib --> started amio drip.  Given 2u PRBCs. Hypotensive in the evening and started on vasopressin + antibiotics.  5/9 IABP removed 5/10 NG placed >1.5 out  5/11 started neostigmime 5/12 Febrile 102 5/17 Failed SBT. Underwent bronchoscopy for attempted therapeutic aspiration for mucus plugging. ETT exchanged.  5/19 Hgb 6.1 given 1UPRBC   Remains on vent, currently with SBT ongoing.  Failed SBT yesterday. FiO2 40%.  Agitated this morning, on Precedex. Per nurse, follows commands.    Off milrinone, co-ox 81%.  On hydralazine/isordil.  BUN/creatinine trending down.  Negative I/Os yesterday with Lasix 40 mg IV bid.  Na normal. CVP about 10 this morning.   TPN off, getting TFs.   NSR with 1st degree AVB currently, had some Wenckebach block overnight.     Objective:   Weight Range:  Vital Signs:   Temp:  [97.6 F (36.4 C)-99.8 F (37.7 C)] 97.6 F (36.4 C) (05/22 0300) Pulse Rate:  [65-73] 65 (05/22 0430) Resp:  [18-36] 36 (05/22 0812) BP: (93-122)/(43-62) 107/48 (05/22 0600) SpO2:  [97 %-100 %] 99 % (05/22 0812) FiO2 (%):  [40 %] 40 % (05/22 0812) Weight:  [81.5 kg] 81.5 kg (05/22 0500) Last BM Date: 10/07/20  Weight change: Filed Weights   10/07/20 0400 10/08/20 0500 10/09/20 0500  Weight: 79.8 kg 81.5 kg 81.5 kg    Intake/Output:   Intake/Output Summary (Last 24 hours) at 10/09/2020 0828 Last data filed at 10/09/2020 0800 Gross per 24 hour  Intake 2566.64 ml  Output 4055 ml  Net -1488.36 ml     PHYSICAL EXAM: CVP 10  General: Agitated, moving in bed. Intubated.  Neck: JVP 10 cm, no thyromegaly or thyroid nodule.  Lungs: Clear to auscultation bilaterally with normal respiratory  effort. CV: Nondisplaced PMI.  Heart regular S1/S2, no S3/S4, no murmur.  No peripheral edema.   Abdomen: Soft, nontender, no hepatosplenomegaly, no distention.  Skin: Intact without lesions or rashes.  Neurologic: Will follow some commands. Extremities: No clubbing or cyanosis.  HEENT: Normal.   Telemetry: Sinus with 1st degree AVB, has had episodes of Wenckebach periodicity (personally reviewed)  Labs: Basic Metabolic Panel: Recent Labs  Lab 10/03/20 0424 10/04/20 0741 10/06/20 0456 10/07/20 0327 10/07/20 1428 10/08/20 0338 10/09/20 0450  NA 147*   < > 145 147* 149* 144 138  K 3.7   < > 3.9 4.3 4.0 4.2 4.0  CL 112*   < > 118* 118* 118* 111 105  CO2 29   < > _0 GLUCOSE 160*   < > 227* 159* 132* 192* 158*  BUN 100*   < > 127* 116* 112* 104* 92*  CREATININE 2.48*   < > 2.51* 2.36* 2.32* 2.10* 1.86*  CALCIUM 8.5*   < > 7.3* 7.7* 7.7* 7.7* 7.6*  MG 2.2  --  2.6* 2.5*  --   --   --   PHOS 1.5*  --  3.2  --   --   --   --    < > = values in this interval not displayed.    Liver Function Tests: Recent Labs  Lab 10/03/20 0424 10/04/20 0741 10/06/20 0456  AST 42* 38 25  ALT _0 ALKPHOS 72 65 46  BILITOT 1.0 1.1 0.7  PROT 5.4* 6.1* 5.0*  ALBUMIN 1.7* 1.9* 1.5*   No results for input(s): LIPASE, AMYLASE in the last 168 hours. No results for input(s): AMMONIA in the last 168 hours.  CBC: Recent Labs  Lab 10/03/20 0424 10/04/20 0444 10/06/20 0320 10/06/20 0434 10/06/20 2155 10/07/20 0327 10/08/20 0338 10/09/20 0450  WBC 12.1*   < > 5.9 6.3  --  7.1 6.7 8.3  NEUTROABS 9.5*  --   --   --   --   --   --   --   HGB 8.2*   < > 5.9* 6.1* 8.0* 8.1* 10.3* 8.5*  HCT 26.9*   < > 18.2* 20.0* 25.6* 25.5* 33.1* 27.2*  MCV 87.1   < > 103.4* 97.1  --  89.2 89.0 88.9  PLT 181   < > 155 182  --  211 202 233   < > = values in this interval not displayed.    Cardiac Enzymes: No results for input(s): CKTOTAL, CKMB, CKMBINDEX, TROPONINI in the last 168  hours.  BNP: BNP (last 3 results) Recent Labs    09/23/20 0311  BNP 1,191.8*    ProBNP (last 3 results) No results for input(s): PROBNP in the last 8760 hours.    Other results:  Imaging: DG CHEST PORT 1 VIEW  Result Date: 10/09/2020 CLINICAL DATA:  Respiratory failure.  Ventilator dependent. EXAM: PORTABLE CHEST 1 VIEW COMPARISON:  10/06/2020 FINDINGS: Cardiomegaly, CABG changes, endotracheal tube with tip 4 cm above the carina, NG/OG and feeding tubes entering the UPPER abdomen with tips off the field of view, and LEFT PICC line with tip overlying the SUPERIOR cavoatrial junction again noted. Mild interstitial opacities and trace bilateral pleural effusions are again noted. Mild bibasilar atelectasis again identified. There is no evidence of pneumothorax. IMPRESSION: Unchanged appearance of the chest with mild interstitial opacities, trace bilateral pleural effusions and bibasilar atelectasis. Support apparatus as described. Electronically Signed   By: Margarette Canada M.D.   On: 10/09/2020 08:16     Medications:     Scheduled Medications: . amiodarone  200 mg Per Tube BID  . aspirin  324 mg Per Tube Daily  . atorvastatin  80 mg Per Tube Daily  . chlorhexidine gluconate (MEDLINE KIT)  15 mL Mouth Rinse BID  . Chlorhexidine Gluconate Cloth  6 each Topical Daily  . clonazePAM  1 mg Per Tube BID  . feeding supplement (PIVOT 1.5 CAL)  1,000 mL Per Tube Q24H  . folic acid  1 mg Per Tube Daily  . free water  150 mL Per Tube Q4H  . furosemide  40 mg Intravenous BID  . hydrALAZINE  25 mg Per Tube Q8H  . insulin aspart  0-24 Units Subcutaneous Q4H  . insulin aspart  6 Units Subcutaneous Q4H  . insulin glargine  5 Units Subcutaneous BID  . ipratropium-albuterol  3 mL Nebulization TID  . isosorbide dinitrate  10 mg Per Tube TID  . mouth rinse  15 mL Mouth Rinse 10 times per day  . metoCLOPramide (REGLAN) injection  5 mg Intravenous Q6H  . pantoprazole (PROTONIX) IV  40 mg Intravenous  Q24H  . polyethylene glycol  17 g Per Tube BID  . QUEtiapine  100 mg Per Tube BID  . rOPINIRole  3 mg Per Tube TID  . sodium chloride flush  10-40 mL Intracatheter Q12H  . sodium  chloride flush  3 mL Intravenous Q12H  . thiamine  100 mg Per Tube Daily  . valproic acid  250 mg Per Tube BID    Infusions: . dexmedetomidine (PRECEDEX) IV infusion 0.7 mcg/kg/hr (10/09/20 0400)  . fentaNYL infusion INTRAVENOUS Stopped (10/01/20 0024)  . lactated ringers Stopped (10/01/20 0607)    PRN Medications: acetaminophen (TYLENOL) oral liquid 160 mg/5 mL, albuterol, fentaNYL (SUBLIMAZE) injection, lip balm, metoprolol tartrate, midazolam, ondansetron (ZOFRAN) IV, sodium chloride flush, sodium chloride flush, sorbitol   Assessment/Plan:   1. 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  - Co-ox 81% off milrinone.   - CVP 10, responded well to IV Lasix yesterday.  Continue Lasix 40 mg IV bid for 2 more doses.  - No ACE/ARB/ARNI/MRA with AKI. No b-blocker yet with shock.  - c/w hydral/nitrates for afterload reduction   2. NSTEMI with critical CAD - 95% LM and high grade ostial RCA - CABG on 5/6 for critical LM disease (LIMA -> LAD, SVG -> OM, SVG -> RCA) - Continue ASA.  - Continue statin.  - Add Plavix soon   3. Acute hypoxic respiratory failure due to pulmonary edema - on vent - Possible aspiration 5/8 & 5/10  - 5/6 Blood CX - NG  - Antibiotics completed 5/16 - CCM following - Failed SBT yesterday, trying again today.  If fails, will get trach tomorrow.   4. AKI / Uremia  - due to ATN - Cr peaked 3.5->2.4->2.1->1.86 - Gradually improving - Support hemodynamics.   5. Ventral Hernia  - 5/10 NG placed to decompress   6. ID - Finished zosyn for possible aspiration 5/16.  - Blood CX 5/6- NGTD, UC 5/13 NGTD  - Afebrile  with normal WBCs.  7. Anemia - Given 1 unit PRBC 5/19. - Hgb down again today at 8.5, transfuse < 8.   8. Hypernatremia - Can stop D5W today.  - Continue free water 150 cc q4 hrs.    9. Atrial fibrillation - Transient, appears sinus today with occasional episodes of Wenckebach periodicity.    - Continue po amiodarone for now.    Length of Stay: [place] Performed by: Loralie Champagne  Total critical care time: 35 minutes  Critical care time was exclusive of separately billable procedures and treating other patients.  Critical care was necessary to treat or prevent imminent or life-threatening deterioration.  Critical care was time spent personally by me (independent of midlevel providers or residents) on the following activities: development of treatment plan with patient and/or surrogate as well as nursing, discussions with consultants, evaluation of patient's response to treatment, examination of patient, obtaining history from patient or surrogate, ordering and performing treatments and interventions, ordering and review of laboratory studies, ordering and review of radiographic studies, pulse oximetry and re-evaluation of patient's condition.  Loralie Champagne, MD  8:28 AM

## 2020-10-09 NOTE — Progress Notes (Signed)
NAME:  Jack Vance, MRN:  [id number], DOB:  06-18-1940, LOS: 16 ADMISSION DATE:  09/23/2020, CONSULTATION DATE:  09/24/2019 REFERRING MD:  Jacinto Halim, CHIEF COMPLAINT:  Acute coronary syndrome   History of Present Illness:  80 year old man who presented with cardiogenic shock secondary to NSTEMI.  Presented with new onset RSCP and severe dyspnea. Found to have acute pulmonary edema and heart failure; intubated and sent for urgent LHC which revealed 3V CAD.  IABP inserted and patient underwent urgent CABG. IABP removed 5/9. Continues to require full mechanical ventilation support.  Pertinent  Medical History   Past Medical History:  Diagnosis Date  . Arthritis   . Asthma   . DDD (degenerative disc disease), lumbar   . Hernia of abdominal wall   . RLS (restless legs syndrome)    Significant Hospital Events: Including procedures, antibiotic start and stop dates in addition to other pertinent events   . 5/6 CABG with Dr Cliffton Asters with LIMA to LAD, SVG to RCA d and SVG to ramus. LV normal and returned to ICU with no vasopressor support.  . No post-operative bleeding but poor RV performance on hemodynamics.  . 5/8 significant hemodynamic instability with labile blood pressure and vasopressor requirements yesterday.  Episode of emesis but no evidence of bowel obstruction.  Evidence of pericardial tamponade. . 5/9 improved hemodynamic, able now to tolerate balloon pump weaning and removal. . 5/11 Stable hemodynamics, continuing diuresis. Improved UOP. TPN consult for initiation 5/12 in the setting of ileus, prolonged NPO status.  . 5/13 sedated, diuresis, milrinone, PICC line today, CVL removed  . 5/14 tolerated short SBT/SAT on Precedex . 5/16 Not tolerating SBT due to significant tachypnea to 50s, placed back on PRVC. Marland Kitchen 5/17 Improved SBT toleration, weaning milrinone, minimizing sedation. Bronched w/ c/f mucus plugging, airways clear, ETT exchanged due to significant buildup. Diuresis  held. . 5/18 More agitated overnight requiring increased sedation with addition of Fentanyl/Versed, Seroquel started. BUN continues to rise. Nephro consult.   5/19 Continues to be agitated overnight requiring increased sedation with fentanyl/versed. Started Klonopin, Seroquel and Requip yesterday. Started on valproate and seroquel increased.  5/20 Patient more interactive following commands and maintaining eyes open and tracking.  5/21 failing SBT's.  Plan for trach next week  Interim History / Subjective:   Failing SBT's, none.  Continues on Precedex for agitation  Objective   Blood pressure (!) 107/48, pulse 65, temperature 97.6 F (36.4 C), temperature source Oral, resp. rate (!) 36, height 5\' 6"  (1.676 m), weight 81.5 kg, SpO2 99 %. CVP:  [0 mmHg-16 mmHg] 4 mmHg  Vent Mode: PRVC FiO2 (%):  [40 %] 40 % Set Rate:  [22 bmp] 22 bmp Vt Set:  [560 mL] 560 mL PEEP:  [5 cmH20] 5 cmH20 Plateau Pressure:  [19 cmH20-27 cmH20] 27 cmH20   Intake/Output Summary (Last 24 hours) at 10/09/2020 1024 Last data filed at 10/09/2020 0940 Gross per 24 hour  Intake 1866.27 ml  Output 4200 ml  Net -2333.73 ml   Filed Weights   10/07/20 0400 10/08/20 0500 10/09/20 0500  Weight: 79.8 kg 81.5 kg 81.5 kg   Physical Examination: Blood pressure (!) 107/48, pulse 65, temperature 97.6 F (36.4 C), temperature source Oral, resp. rate (!) 36, height 5\' 6"  (1.676 m), weight 81.5 kg, SpO2 99 %. Gen:      No acute distress, chronically ill-appearing HEENT:  EOMI, sclera anicteric Neck:     No masses; no thyromegaly, ETT Lungs:    Clear to auscultation  bilaterally; normal respiratory effort CV:         Regular rate and rhythm; no murmurs Abd:      Large ventral hernia, positive bowel sounds, soft, nontender Ext:    No edema; adequate peripheral perfusion Skin:      Warm and dry; no rash Neuro: Sedated  Labs/imaging that I have personally reviewed: (right click and "Reselect all SmartList Selections"  daily)   Creatinine improved to 1.86, CBC is stable Chest x-ray with unchanged bilateral interstitial opacities and atelectasis  Resolved Hospital Problem List   NSTEMI, Thrombocytopenia  Assessment & Plan:   Acute hypoxemic hypercapnic respiratory failure requiring mechanical ventilation. Prolonged vent weaning due to NM weakness. Possible aspiration Continue vent support, failing SBT's due to agitation, tachypnea Plan for trach on 5/23 Monitor off antibiotic  Critically ill due to RV dysfunction following ACS and cardiopulmonary bypass  Cardiogenic shock NSTEMI with culprit RCA involvement Coronary artery disease Continue amiodarone, diuresis with Lasix Hydralazine and isosorbide for afterload reduction  Acute kidney injury Uremia Hypernatremia Renal function is improving.  Nephrology has signed off  Gastric ileus, improved Continue tube feeds.  TPN has been discontinued  Anemia (Baseline ~10) No evidence of active bleed.  New to monitor Resume subcu heparin for DVT prophylaxis  Agitated delirium controlled H/o alcohol abuse - Delirium precautions - Seroquel, klonopin, valproate.  Increase Seroquel dose on 5/21 - Supplemental vitamins via TPN  RLS - Continue home Requip  DM - Continue Lantus, novolog, SSI and CBG  Best Practice: (right click and "Reselect all SmartList Selections" daily)  Diet:  Tube Feed   Pain/Anxiety/Delirium protocol (if indicated): Yes (RASS goal -1) VAP protocol (if indicated): Yes DVT prophylaxis: Subcutaneous Heparin GI prophylaxis: PPI Glucose control:  SSI Yes and Basal insulin Yes, Central venous access:  Yes, and it is still needed Arterial line:  N/A Foley:  Yes, and it is still needed Mobility:  bed rest  PT consulted: N/A Last date of multidisciplinary goals of care discussion [Defer to Primary] Code Status:  full code Disposition: ICU  Critical care time:    The patient is critically ill with multiple organ system  failure and requires high complexity decision making for assessment and support, frequent evaluation and titration of therapies, advanced monitoring, review of radiographic studies and interpretation of complex data.   Critical Care Time devoted to patient care services, exclusive of separately billable procedures, described in this note is 35 minutes.   Chilton Greathouse MD [facility] See Amion for pager  If no response to pager , please call [phone] until 7pm After 7:00 pm call Elink  [phone] 10/09/2020, 10:34 AM

## 2020-10-09 NOTE — Progress Notes (Signed)
16 Days Post-Op Procedure(s) (LRB): CORONARY ARTERY BYPASS GRAFTING (CABG)  TIMES 4 USING LEFT GREATER SAPHENOUS VEIN HARVESTED ENDOSCOPICALLY AND LEFT INTERNAL MAMMARY ARTERY (N/A) TRANSESOPHAGEAL ECHOCARDIOGRAM (TEE) (N/A) Subjective:  Intubated on vent. CPAP/PS this am with RR 31-34.  CVP has been 4 overnight. -1168 cc/24 hrs but wt unchanged if accurate.   Objective: Vital signs in last 24 hours: Temp:  [97.6 F (36.4 C)-99.8 F (37.7 C)] 97.6 F (36.4 C) (05/22 0300) Pulse Rate:  [65-73] 65 (05/22 0430) Cardiac Rhythm: Heart block (05/22 0600) Resp:  [18-36] 36 (05/22 0812) BP: (93-122)/(43-62) 107/48 (05/22 0600) SpO2:  [97 %-100 %] 99 % (05/22 0812) FiO2 (%):  [40 %] 40 % (05/22 0812) Weight:  [81.5 kg] 81.5 kg (05/22 0500)  Hemodynamic parameters for last 24 hours: CVP:  [0 mmHg-16 mmHg] 4 mmHg  Intake/Output from previous day: 05/21 0701 - 05/22 0700 In: 2832.2 [I.V.:1682.2; NG/GT:1150] Out: 4000 [Urine:4000] Intake/Output this shift: Total I/O In: -  Out: 230 [Urine:230]  General appearance: restless on vent Neurologic: moves all extremities strongly. Follows commands but very restless Heart: regular rate and rhythm Lungs: coarse Abdomen: soft, non-tender; bowel sounds normal; no masses,  no organomegaly Extremities: extremities normal, atraumatic, no cyanosis or edema Wound: incision healing well.  Lab Results: Recent Labs    10/08/20 0338 10/09/20 0450  WBC 6.7 8.3  HGB 10.3* 8.5*  HCT 33.1* 27.2*  PLT 202 233   BMET:  Recent Labs    10/08/20 0338 10/09/20 0450  NA 144 138  K 4.2 4.0  CL 111 105  CO2 25 27  GLUCOSE 192* 158*  BUN 104* 92*  CREATININE 2.10* 1.86*  CALCIUM 7.7* 7.6*    PT/INR: No results for input(s): LABPROT, INR in the last 72 hours. ABG    Component Value Date/Time   PHART 7.410 09/26/2020 0539   HCO3 19.9 (L) 09/26/2020 0539   TCO2 21 (L) 09/26/2020 0539   ACIDBASEDEF 4.0 (H) 09/26/2020 0539   O2SAT 81.0  10/09/2020 0627   CBG (last 3)  Recent Labs    10/08/20 2349 10/09/20 0431 10/09/20 0747  GLUCAP 159* 156* 142*   CXR stable with small bilateral pleural effusions.  Assessment/Plan: S/P Procedure(s) (LRB): CORONARY ARTERY BYPASS GRAFTING (CABG)  TIMES 4 USING LEFT GREATER SAPHENOUS VEIN HARVESTED ENDOSCOPICALLY AND LEFT INTERNAL MAMMARY ARTERY (N/A) TRANSESOPHAGEAL ECHOCARDIOGRAM (TEE) (N/A)  POD 16  Hemodynamically stable in sinus rhythm on amio per tube. Co-ox 81 but probably not that high.  VDRF: weaning per CCM. Plan trach Monday if no progress over weekend.  AKI/ prerenal azotemia with hypernatremia: nephrology following. Excellent urine output. Na, BUN and creat improving. Getting free water per tube in addition to D5W IV. Could stop D5W.  Tolerating TF at goal.  Glucose under adequate control.   LOS: 16 days    Alleen Borne 10/09/2020

## 2020-10-10 ENCOUNTER — Inpatient Hospital Stay (HOSPITAL_COMMUNITY): Payer: No Typology Code available for payment source

## 2020-10-10 DIAGNOSIS — I2101 ST elevation (STEMI) myocardial infarction involving left main coronary artery: Secondary | ICD-10-CM | POA: Diagnosis not present

## 2020-10-10 DIAGNOSIS — J9611 Chronic respiratory failure with hypoxia: Secondary | ICD-10-CM | POA: Diagnosis not present

## 2020-10-10 DIAGNOSIS — I5043 Acute on chronic combined systolic (congestive) and diastolic (congestive) heart failure: Secondary | ICD-10-CM | POA: Diagnosis not present

## 2020-10-10 DIAGNOSIS — Z951 Presence of aortocoronary bypass graft: Secondary | ICD-10-CM | POA: Diagnosis not present

## 2020-10-10 LAB — BASIC METABOLIC PANEL
Anion gap: 9 (ref 5–15)
BUN: 93 mg/dL — ABNORMAL HIGH (ref 8–23)
CO2: 27 mmol/L (ref 22–32)
Calcium: 7.7 mg/dL — ABNORMAL LOW (ref 8.9–10.3)
Chloride: 102 mmol/L (ref 98–111)
Creatinine, Ser: 1.92 mg/dL — ABNORMAL HIGH (ref 0.61–1.24)
GFR, Estimated: 35 mL/min — ABNORMAL LOW (ref >60)
Glucose, Bld: 139 mg/dL — ABNORMAL HIGH (ref 70–99)
Potassium: 3.6 mmol/L (ref 3.5–5.1)
Sodium: 138 mmol/L (ref 135–145)

## 2020-10-10 LAB — PROTIME-INR
INR: 1.1 (ref 0.8–1.2)
Prothrombin Time: 14.3 seconds (ref 11.4–15.2)

## 2020-10-10 LAB — CBC
HCT: 29.2 % — ABNORMAL LOW (ref 39.0–52.0)
Hemoglobin: 9.1 g/dL — ABNORMAL LOW (ref 13.0–17.0)
MCH: 27.7 pg (ref 26.0–34.0)
MCHC: 31.2 g/dL (ref 30.0–36.0)
MCV: 88.8 fL (ref 80.0–100.0)
Platelets: 230 10*3/uL (ref 150–400)
RBC: 3.29 MIL/uL — ABNORMAL LOW (ref 4.22–5.81)
RDW: 17.6 % — ABNORMAL HIGH (ref 11.5–15.5)
WBC: 6.8 10*3/uL (ref 4.0–10.5)
nRBC: 0 % (ref 0.0–0.2)

## 2020-10-10 LAB — GLUCOSE, CAPILLARY
Glucose-Capillary: 124 mg/dL — ABNORMAL HIGH (ref 70–99)
Glucose-Capillary: 126 mg/dL — ABNORMAL HIGH (ref 70–99)
Glucose-Capillary: 128 mg/dL — ABNORMAL HIGH (ref 70–99)
Glucose-Capillary: 130 mg/dL — ABNORMAL HIGH (ref 70–99)
Glucose-Capillary: 140 mg/dL — ABNORMAL HIGH (ref 70–99)
Glucose-Capillary: 144 mg/dL — ABNORMAL HIGH (ref 70–99)
Glucose-Capillary: 148 mg/dL — ABNORMAL HIGH (ref 70–99)

## 2020-10-10 LAB — COOXEMETRY PANEL
Carboxyhemoglobin: 0.9 % (ref 0.5–1.5)
Methemoglobin: 1 % (ref 0.0–1.5)
O2 Saturation: 65 %
Total hemoglobin: 10.8 g/dL — ABNORMAL LOW (ref 12.0–16.0)

## 2020-10-10 LAB — APTT: aPTT: 28 seconds (ref 24–36)

## 2020-10-10 MED ORDER — VECURONIUM BROMIDE 10 MG IV SOLR
10.0000 mg | Freq: Once | INTRAVENOUS | Status: AC
  Administered 2020-10-10: 10 mg via INTRAVENOUS
  Filled 2020-10-10: qty 10

## 2020-10-10 MED ORDER — PANTOPRAZOLE SODIUM 40 MG PO PACK
40.0000 mg | PACK | Freq: Every day | ORAL | Status: DC
  Administered 2020-10-10 – 2020-10-20 (×11): 40 mg
  Filled 2020-10-10 (×11): qty 20

## 2020-10-10 MED ORDER — PROPOFOL 1000 MG/100ML IV EMUL
5.0000 ug/kg/min | INTRAVENOUS | Status: DC
  Administered 2020-10-10: 5 ug/kg/min via INTRAVENOUS
  Filled 2020-10-10: qty 100

## 2020-10-10 MED ORDER — FENTANYL CITRATE (PF) 100 MCG/2ML IJ SOLN
200.0000 ug | Freq: Once | INTRAMUSCULAR | Status: AC
  Administered 2020-10-10: 100 ug via INTRAVENOUS
  Filled 2020-10-10: qty 4

## 2020-10-10 MED ORDER — NOREPINEPHRINE 16 MG/250ML-% IV SOLN
0.0000 ug/min | INTRAVENOUS | Status: DC
  Administered 2020-10-10: 1 ug/min via INTRAVENOUS
  Filled 2020-10-10: qty 250

## 2020-10-10 MED ORDER — POTASSIUM CHLORIDE 10 MEQ/50ML IV SOLN
10.0000 meq | INTRAVENOUS | Status: AC
  Administered 2020-10-10 (×3): 10 meq via INTRAVENOUS
  Filled 2020-10-10 (×2): qty 50

## 2020-10-10 MED ORDER — PROPOFOL 10 MG/ML IV BOLUS
500.0000 mg | Freq: Once | INTRAVENOUS | Status: DC
  Filled 2020-10-10: qty 60

## 2020-10-10 MED ORDER — AMIODARONE HCL 200 MG PO TABS
200.0000 mg | ORAL_TABLET | Freq: Every day | ORAL | Status: DC
  Administered 2020-10-10 – 2020-10-20 (×11): 200 mg
  Filled 2020-10-10 (×11): qty 1

## 2020-10-10 MED ORDER — MIDAZOLAM HCL 2 MG/2ML IJ SOLN
5.0000 mg | Freq: Once | INTRAMUSCULAR | Status: AC
  Administered 2020-10-10: 4 mg via INTRAVENOUS
  Filled 2020-10-10: qty 6

## 2020-10-10 MED ORDER — HEPARIN SODIUM (PORCINE) 5000 UNIT/ML IJ SOLN
5000.0000 [IU] | Freq: Three times a day (TID) | INTRAMUSCULAR | Status: DC
  Administered 2020-10-10 – 2020-10-20 (×31): 5000 [IU] via SUBCUTANEOUS
  Filled 2020-10-10 (×28): qty 1

## 2020-10-10 MED ORDER — ETOMIDATE 2 MG/ML IV SOLN
40.0000 mg | Freq: Once | INTRAVENOUS | Status: AC
  Administered 2020-10-10: 20 mg via INTRAVENOUS
  Filled 2020-10-10: qty 20

## 2020-10-10 MED FILL — Medication: Qty: 1 | Status: AC

## 2020-10-10 NOTE — Progress Notes (Signed)
NAME:  Jack Vance, MRN:  [id number], DOB:  Sep 04, 1940, LOS: 17 ADMISSION DATE:  09/23/2020, CONSULTATION DATE:  09/24/2019 REFERRING MD:  Jacinto Halim, CHIEF COMPLAINT:  Acute coronary syndrome   History of Present Illness:  80 year old man who presented with cardiogenic shock secondary to NSTEMI.  Presented with new onset RSCP and severe dyspnea. Found to have acute pulmonary edema and heart failure; intubated and sent for urgent LHC which revealed 3V CAD.  IABP inserted and patient underwent urgent CABG. IABP removed 5/9. Continues to require full mechanical ventilation support.  Pertinent  Medical History   Past Medical History:  Diagnosis Date  . Arthritis   . Asthma   . DDD (degenerative disc disease), lumbar   . Hernia of abdominal wall   . RLS (restless legs syndrome)    Significant Hospital Events: Including procedures, antibiotic start and stop dates in addition to other pertinent events   . 5/6 CABG with Dr Cliffton Asters with LIMA to LAD, SVG to RCA d and SVG to ramus. LV normal and returned to ICU with no vasopressor support.  . No post-operative bleeding but poor RV performance on hemodynamics.  . 5/8 significant hemodynamic instability with labile blood pressure and vasopressor requirements yesterday.  Episode of emesis but no evidence of bowel obstruction.  Evidence of pericardial tamponade. . 5/9 improved hemodynamic, able now to tolerate balloon pump weaning and removal. . 5/11 Stable hemodynamics, continuing diuresis. Improved UOP. TPN consult for initiation 5/12 in the setting of ileus, prolonged NPO status.  . 5/13 sedated, diuresis, milrinone, PICC line today, CVL removed  . 5/14 tolerated short SBT/SAT on Precedex . 5/16 Not tolerating SBT due to significant tachypnea to 50s, placed back on PRVC. Marland Kitchen 5/17 Improved SBT toleration, weaning milrinone, minimizing sedation. Bronched w/ c/f mucus plugging, airways clear, ETT exchanged due to significant buildup. Diuresis  held. . 5/18 More agitated overnight requiring increased sedation with addition of Fentanyl/Versed, Seroquel started. BUN continues to rise. Nephro consult.   5/19 Continues to be agitated overnight requiring increased sedation with fentanyl/versed. Started Klonopin, Seroquel and Requip yesterday. Started on valproate and seroquel increased.  5/20 Patient more interactive following commands and maintaining eyes open and tracking.  5/21 failing SBT's.  Plan for trach next week  5/23 Trach (BI/DS)  Interim History / Subjective:   Remains on mechanical support.  Failing SAT SBT.  Discussed plans with cardiothoracic surgery.  Plans for bedside tracheostomy tube placement.  Objective   Blood pressure (!) 107/58, pulse 60, temperature 97.6 F (36.4 C), temperature source Axillary, resp. rate (!) 22, height 5\' 6"  (1.676 m), weight 80 kg, SpO2 98 %. CVP:  [3 mmHg-14 mmHg] 4 mmHg  Vent Mode: PRVC FiO2 (%):  [40 %] 40 % Set Rate:  [22 bmp] 22 bmp Vt Set:  [560 mL] 560 mL PEEP:  [5 cmH20] 5 cmH20 Plateau Pressure:  [20 cmH20-28 cmH20] 26 cmH20   Intake/Output Summary (Last 24 hours) at 10/10/2020 0753 Last data filed at 10/10/2020 0700 Gross per 24 hour  Intake 2419.45 ml  Output 5400 ml  Net -2980.55 ml   Filed Weights   10/08/20 0500 10/09/20 0500 10/10/20 0500  Weight: 81.5 kg 81.5 kg 80 kg   Physical Examination: BP (!) 107/58   Pulse 60   Temp 97.6 F (36.4 C) (Axillary)   Resp (!) 22   Ht 5\' 6"  (1.676 m)   Wt 80 kg   SpO2 98%   BMI 28.47 kg/m    Gen:  Elderly male no acute distress, chronically ill-appearing intubated on mechanical life support HEENT: Endotracheal tube in place Lungs:    Bilateral mechanically ventilated breath sounds CV:        regular rate rhythm, S1-S2 Abd:      Palpable ventral hernia, bowel sounds present, soft, nontender Ext:    No significant edema Skin:     warm dry no rash Neuro: Sedated on mechanical support  Labs/imaging that I have  personally reviewed: (right click and "Reselect all SmartList Selections" daily)   Sodium 138 Potassium 3.6 Serum creatinine 1.92 White blood cell count 6.8 Hemoglobin 9.1 Platelets 230  Resolved Hospital Problem List   NSTEMI, Thrombocytopenia  Assessment & Plan:   Acute hypoxemic hypercapnic respiratory failure requiring mechanical ventilation. Prolonged vent weaning due to NM weakness. Possible aspiration Plan: Continues to have recurrent failing episodes of SBT/SAT Remains on low-dose sedation Suspect he will need prolonged mechanical vent wean due to prolonged critical illness and weakness related to this. Consent obtained for bedside tracheostomy tube placement today.  Critically ill due to RV dysfunction following ACS and cardiopulmonary bypass  Cardiogenic shock NSTEMI with culprit RCA involvement Coronary artery disease Plan: Continue amiodarone, diuresis with Lasix to maintain euvolemia Continue on hydralazine plus isosorbide for afterload reduction.  Acute kidney injury Uremia Hypernatremia Plan: Renal function has improved Continue to observe urine output Avoid nephrotoxic agents  Gastric ileus, improved Plan: Continue tube feeds TPN has been discontinued  Anemia (Baseline ~10) Plan: Subcu heparin for DVT prophylaxis Observe for any sign of acute bleeding  Agitated delirium controlled H/o alcohol abuse Plan: Observe for delirium Continue delirium precautions Continue Seroquel, Klonopin, valproic acid  RLS Plan: Continue Requip  DM Plan: Continue Lantus, SSI, CBGs  Best Practice: (right click and "Reselect all SmartList Selections" daily)  Diet:  Tube Feed   Pain/Anxiety/Delirium protocol (if indicated): Yes (RASS goal -1) VAP protocol (if indicated): Yes DVT prophylaxis: Subcutaneous Heparin GI prophylaxis: PPI Glucose control:  SSI Yes and Basal insulin Yes, Central venous access:  Yes, and it is still needed Arterial line:   N/A Foley:  Yes, and it is still needed Mobility:  bed rest  PT consulted: N/A Last date of multidisciplinary goals of care discussion [Defer to Primary] Code Status:  full code Disposition: ICU   This patient is critically ill with multiple organ system failure; which, requires frequent high complexity decision making, assessment, support, evaluation, and titration of therapies. This was completed through the application of advanced monitoring technologies and extensive interpretation of multiple databases. During this encounter critical care time was devoted to patient care services described in this note for 33 minutes.  Josephine Igo, DO [facility] 10/10/2020 7:53 AM

## 2020-10-10 NOTE — Progress Notes (Signed)
OT Cancellation Note  Patient Details Name: Jack Vance MRN: [id number] DOB: 01/11/1941   Cancelled Treatment:    Reason Eval/Treat Not Completed: Patient's level of consciousness (pt getting trach on first attempt and now remains too sedated to mobilize post trach)  Lorre Munroe 10/10/2020, 12:47 PM

## 2020-10-10 NOTE — Procedures (Signed)
Diagnostic Bronchoscopy  Jack Vance  [id number]  1941-04-27  Date:10/10/20  Time:1:12 PM   Provider Performing:Akashdeep Chuba C Katrinka Blazing   Procedure: Diagnostic Bronchoscopy (12458)  Indication(s) Assist with direct visualization of tracheostomy placement  Consent Risks of the procedure as well as the alternatives and risks of each were explained to the patient and/or caregiver.  Consent for the procedure was obtained.   Anesthesia See separate tracheostomy note   Time Out Verified patient identification, verified procedure, site/side was marked, verified correct patient position, special equipment/implants available, medications/allergies/relevant history reviewed, required imaging and test results available.   Sterile Technique Usual hand hygiene, masks, gowns, and gloves were used   Procedure Description Bronchoscope advanced through endotracheal tube and into airway.  After suctioning out tracheal secretions, bronchoscope used to provide direct visualization of tracheostomy placement.   Complications/Tolerance None; patient tolerated the procedure well.   EBL None  Specimen(s) None

## 2020-10-10 NOTE — Progress Notes (Addendum)
Patient ID: Jack Vance, male   DOB: 10/25/1940, 80 y.o.   MRN: [id number]    Advanced Heart Failure Rounding Note   Subjective:    Underwent emergent CABG on 5/6 for critical LM disease (LIMA -> LAD, SVG -> OM, SVG -> RCA) 5/6 Cultures negative.  5/8 Possible aspiration. A fib --> started amio drip.  Given 2u PRBCs. Hypotensive in the evening and started on vasopressin + antibiotics.  5/9 IABP removed 5/10 NG placed >1.5 out  5/11 started neostigmime 5/12 Febrile 102 5/17 Failed SBT. Underwent bronchoscopy for attempted therapeutic aspiration for mucus plugging. ETT exchanged.  5/19 Hgb 6.1 given 1UPRBC 5/21  Failed SBT   CO-OX 65%.   Remains intubated. FiO2 40%.   Sedated on vent.     Objective:   Weight Range:  Vital Signs:   Temp:  [96.8 F (36 C)-99.5 F (37.5 C)] 97.5 F (36.4 C) (05/22 2300) Pulse Rate:  [67-78] 67 (05/23 0327) Resp:  [12-36] 22 (05/23 0700) BP: (92-117)/(48-60) 107/58 (05/23 0700) SpO2:  [94 %-100 %] 97 % (05/23 0700) FiO2 (%):  [40 %] 40 % (05/23 0600) Weight:  [80 kg] 80 kg (05/23 0500) Last BM Date: 10/07/20  Weight change: Filed Weights   10/08/20 0500 10/09/20 0500 10/10/20 0500  Weight: 81.5 kg 81.5 kg 80 kg    Intake/Output:   Intake/Output Summary (Last 24 hours) at 10/10/2020 0718 Last data filed at 10/10/2020 0700 Gross per 24 hour  Intake 2419.45 ml  Output 5400 ml  Net -2980.55 ml    CVP 9-10  PHYSICAL EXAM: General:  Sedated on vent.  HEENT: ETT + cortak  Neck: supple. JVP difficult to assess.  Carotids 2+ bilat; no bruits. No lymphadenopathy or thryomegaly appreciated. Cor: PMI nondisplaced. Regular rate & rhythm. No rubs, gallops or murmurs. Lungs: clear Abdomen: + hernia, soft, nontender, nonistended. No hepatosplenomegaly. No bruits or masses. Good bowel sounds. Extremities: no cyanosis, clubbing, rash, edema. LUE PICC Neuro: Sedated on vent.   Telemetry: SR with 1st degree heart block  Labs: Basic  Metabolic Panel: Recent Labs  Lab 10/06/20 0456 10/07/20 0327 10/07/20 1428 10/08/20 0338 10/09/20 0450 10/10/20 0342  NA 145 147* 149* 144 138 138  K 3.9 4.3 4.0 4.2 4.0 3.6  CL 118* 118* 118* 111 105 102  CO2 _0 GLUCOSE 227* 159* 132* 192* 158* 139*  BUN 127* 116* 112* 104* 92* 93*  CREATININE 2.51* 2.36* 2.32* 2.10* 1.86* 1.92*  CALCIUM 7.3* 7.7* 7.7* 7.7* 7.6* 7.7*  MG 2.6* 2.5*  --   --   --   --   PHOS 3.2  --   --   --   --   --     Liver Function Tests: Recent Labs  Lab 10/04/20 0741 10/06/20 0456  AST 38 25  ALT 24 23  ALKPHOS 65 46  BILITOT 1.1 0.7  PROT 6.1* 5.0*  ALBUMIN 1.9* 1.5*   No results for input(s): LIPASE, AMYLASE in the last 168 hours. No results for input(s): AMMONIA in the last 168 hours.  CBC: Recent Labs  Lab 10/06/20 0434 10/06/20 2155 10/07/20 0327 10/08/20 0338 10/09/20 0450 10/10/20 0342  WBC 6.3  --  7.1 6.7 8.3 6.8  HGB 6.1* 8.0* 8.1* 10.3* 8.5* 9.1*  HCT 20.0* 25.6* 25.5* 33.1* 27.2* 29.2*  MCV 97.1  --  89.2 89.0 88.9 88.8  PLT 182  --  211 202 233 230    Cardiac Enzymes:  No results for input(s): CKTOTAL, CKMB, CKMBINDEX, TROPONINI in the last 168 hours.  BNP: BNP (last 3 results) Recent Labs    09/23/20 0311  BNP 1,191.8*    ProBNP (last 3 results) No results for input(s): PROBNP in the last 8760 hours.    Other results:  Imaging: DG CHEST PORT 1 VIEW  Result Date: 10/09/2020 CLINICAL DATA:  Respiratory failure.  Ventilator dependent. EXAM: PORTABLE CHEST 1 VIEW COMPARISON:  10/06/2020 FINDINGS: Cardiomegaly, CABG changes, endotracheal tube with tip 4 cm above the carina, NG/OG and feeding tubes entering the UPPER abdomen with tips off the field of view, and LEFT PICC line with tip overlying the SUPERIOR cavoatrial junction again noted. Mild interstitial opacities and trace bilateral pleural effusions are again noted. Mild bibasilar atelectasis again identified. There is no evidence of  pneumothorax. IMPRESSION: Unchanged appearance of the chest with mild interstitial opacities, trace bilateral pleural effusions and bibasilar atelectasis. Support apparatus as described. Electronically Signed   By: Margarette Canada M.D.   On: 10/09/2020 08:16     Medications:     Scheduled Medications: . amiodarone  200 mg Per Tube BID  . aspirin  324 mg Per Tube Daily  . atorvastatin  80 mg Per Tube Daily  . chlorhexidine gluconate (MEDLINE KIT)  15 mL Mouth Rinse BID  . Chlorhexidine Gluconate Cloth  6 each Topical Daily  . clonazePAM  1 mg Per Tube BID  . feeding supplement (PIVOT 1.5 CAL)  1,000 mL Per Tube Q24H  . folic acid  1 mg Per Tube Daily  . free water  150 mL Per Tube Q4H  . hydrALAZINE  25 mg Per Tube Q8H  . insulin aspart  0-24 Units Subcutaneous Q4H  . insulin aspart  6 Units Subcutaneous Q4H  . insulin glargine  5 Units Subcutaneous BID  . ipratropium-albuterol  3 mL Nebulization BID  . isosorbide dinitrate  10 mg Per Tube TID  . mouth rinse  15 mL Mouth Rinse 10 times per day  . metoCLOPramide (REGLAN) injection  5 mg Intravenous Q6H  . pantoprazole (PROTONIX) IV  40 mg Intravenous Q24H  . polyethylene glycol  17 g Per Tube BID  . QUEtiapine  100 mg Per Tube BID  . rOPINIRole  3 mg Per Tube TID  . sodium chloride flush  10-40 mL Intracatheter Q12H  . sodium chloride flush  3 mL Intravenous Q12H  . thiamine  100 mg Per Tube Daily  . valproic acid  250 mg Per Tube BID    Infusions: . dexmedetomidine (PRECEDEX) IV infusion 0.7 mcg/kg/hr (10/10/20 0700)  . dextrose 50 mL/hr at 10/10/20 0700  . fentaNYL infusion INTRAVENOUS Stopped (10/01/20 0024)  . lactated ringers Stopped (10/01/20 0607)    PRN Medications: acetaminophen (TYLENOL) oral liquid 160 mg/5 mL, albuterol, fentaNYL (SUBLIMAZE) injection, lip balm, metoprolol tartrate, midazolam, ondansetron (ZOFRAN) IV, sodium chloride flush, sodium chloride flush, sorbitol   Assessment/Plan:   1. Acute systolic HF  -> cardiogenic shock - due to severe iCM - EF 20-25% RV ok pre-op, post-op bedside echo with EF approx 30%, septal severe hypokinesis, normal RV size with mildly decreased systolic function.  - Co-ox 65% off milrinone.   - CVP 9-10 .  - No ACE/ARB/ARNI/MRA with AKI. No b-blocker yet with shock.  - c/w hydral/nitrates for afterload reduction   2. NSTEMI with critical CAD - 95% LM and high grade ostial RCA - CABG on 5/6 for critical LM disease (LIMA -> LAD, SVG -> OM,  SVG -> RCA) - Continue ASA.  - Continue statin.  - Add Plavix soon   3. Acute hypoxic respiratory failure due to pulmonary edema - on vent - Possible aspiration 5/8 & 5/10  - 5/6 Blood CX - NG  - Antibiotics completed 5/16 - CCM following - Failed SBT 5/21   4. AKI / Uremia  - due to ATN - Cr peaked 3.5->2.4->2.1->1.86->1.9  - Support hemodynamics.   5. Ventral Hernia  - 5/10 NG placed to decompress   6. ID - Finished zosyn for possible aspiration 5/16.  - Blood CX 5/6- NGTD, UC 5/13 NGTD  - Afebrile   7. Anemia - Given 1 unit PRBC 5/19. - Hgb 9.1 , transfuse < 8.   8. Hypernatremia -Sodium 138  - Stop free water.     9. Atrial fibrillation - Transient, appears sinus today with occasional episodes of Wenckebach periodicity.    - Cut amio back to 200 mg daily.    Length of Stay: [place], NP  7:18 AM  Patient seen with NP, agree with the above note.    Good diuresis again yesterday.  Creatinine stable at 1.9, co-ox 65%, CVP 9-10.    Failed SBT yesterday, tracheostomy placed today.   General: Agitated.  Neck: JVP 10, no thyromegaly or thyroid nodule.  Lungs: Clear to auscultation bilaterally with normal respiratory effort. CV: Nondisplaced PMI.  Heart regular S1/S2, no S3/S4, no murmur.  No peripheral edema.   Abdomen: Soft, nontender, no hepatosplenomegaly, no distention.  Skin: Intact without lesions or rashes.  Neurologic: Alert and oriented x 3.  Psych: Normal  affect. Extremities: No clubbing or cyanosis.  HEENT: Normal.   Will give Lasix 40 mg IV x 1 today.  He is off pressors and stable.  Watch creatinine, stable today.   NSR today on amiodarone, can decrease to 200 mg daily.   Loralie Champagne 10/10/2020

## 2020-10-10 NOTE — Progress Notes (Signed)
PT Cancellation Note  Patient Details Name: Jack Vance MRN: [id number] DOB: Dec 20, 1940   Cancelled Treatment:    Reason Eval/Treat Not Completed: Patient's level of consciousness (pt getting trach on first attempt and now remains too sedated to mobilize post trach)   Kylin Genna B Wilmetta Speiser 10/10/2020, 12:38 PM  Merryl Hacker, PT Acute Rehabilitation Services Pager: [phone] Office: [phone]

## 2020-10-10 NOTE — Procedures (Signed)
Percutaneous Tracheostomy Procedure Note  Jack Vance  [id number]  [birth year]  Date:10/10/20  Time:10:53 AM   Provider Performing:Maxtyn Nuzum L Charmaine Placido  Procedure: Percutaneous Tracheostomy with Bronchoscopic Guidance (40981)  Indication(s) Chronic respiratory failure   Consent Risks of the procedure as well as the alternatives and risks of each were explained to the patient and/or caregiver.  Consent for the procedure was obtained.  Anesthesia Etomidate, Versed, Fentanyl, Vecuronium  Time Out Verified patient identification, verified procedure, site/side was marked, verified correct patient position, special equipment/implants available, medications/allergies/relevant history reviewed, required imaging and test results available.   Sterile Technique Maximal sterile technique including sterile barrier drape, hand hygiene, sterile gown, sterile gloves, mask, hair covering.  Procedure Description Appropriate anatomy identified by palpation.  Patient's neck prepped and draped in sterile fashion.  1% lidocaine with epinephrine was used to anesthetize skin overlying neck.  1.5cm incision made and blunt dissection performed until tracheal rings could be easily palpated.   Then a size 6 Shiley tracheostomy was placed under bronchoscopic visualization using usual Seldinger technique and serial dilation.   Bronchoscope confirmed placement above the carina.  Tracheostomy was sutured in place with adhesive pad to protect skin under pressure.    Patient connected to ventilator.  Complications/Tolerance None; patient tolerated the procedure well. Chest X-ray is ordered to confirm no post-procedural complication.  EBL Minimal  Specimen(s) None   Josephine Igo, DO [facility] 10/10/2020 10:53 AM

## 2020-10-10 NOTE — Progress Notes (Signed)
      [address]       [place] [address]             [phone]                 17 Days Post-Op Procedure(s) (LRB): CORONARY ARTERY BYPASS GRAFTING (CABG)  TIMES 4 USING LEFT GREATER SAPHENOUS VEIN HARVESTED ENDOSCOPICALLY AND LEFT INTERNAL MAMMARY ARTERY (N/A) TRANSESOPHAGEAL ECHOCARDIOGRAM (TEE) (N/A)   Events: No events    _______________________________________________________________ Vitals: BP (!) 107/58   Pulse 60   Temp 97.6 F (36.4 C) (Axillary)   Resp (!) 22   Ht 5\' 6"  (1.676 m)   Wt 80 kg   SpO2 98%   BMI 28.47 kg/m   - Neuro: arousable.  On precedex  - Cardiovascular: Normal sinus rhythm.  I  Drips:  none CVP:  [3 mmHg-14 mmHg] 4 mmHg  - Pulm:  Vent Mode: PRVC FiO2 (%):  [40 %] 40 % Set Rate:  [22 bmp] 22 bmp Vt Set:  [560 mL] 560 mL PEEP:  [5 cmH20] 5 cmH20 Plateau Pressure:  [20 cmH20-28 cmH20] 26 cmH20  ABG    Component Value Date/Time   PHART 7.410 09/26/2020 0539   PCO2ART 31.1 (L) 09/26/2020 0539   PO2ART 136 (H) 09/26/2020 0539   HCO3 19.9 (L) 09/26/2020 0539   TCO2 21 (L) 09/26/2020 0539   ACIDBASEDEF 4.0 (H) 09/26/2020 0539   O2SAT 65.0 10/10/2020 0336    - Abd: distended, but soft - Extremity: Warm  .Intake/Output      05/22 0701 05/23 0700 05/23 0701 05/24 0700   I.V. (mL/kg) 709.5 (8.9)    NG/GT 1710    Total Intake(mL/kg) 2419.5 (30.2)    Urine (mL/kg/hr) 4700 (2.4)    Emesis/NG output 700    Total Output 5400    Net -2980.6            _______________________________________________________________ Labs: CBC Latest Ref Rng & Units 10/10/2020 10/09/2020 10/08/2020  WBC 4.0 - 10.5 K/uL 6.8 8.3 6.7  Hemoglobin 13.0 - 17.0 g/dL 10/10/2020) 2.1(J) 10.3(L)  Hematocrit 39.0 - 52.0 % 29.2(L) 27.2(L) 33.1(L)  Platelets 150 - 400 K/uL 230 233 202   CMP Latest Ref Rng & Units 10/10/2020 10/09/2020 10/08/2020  Glucose 70 - 99 mg/dL 10/10/2020) 740(C) 144(Y)  BUN 8 - 23 mg/dL 185(U) 31(S) 97(W)  Creatinine 0.61 -  1.24 mg/dL 263(Z) 8.58(I) 5.02(D)  Sodium 135 - 145 mmol/L 138 138 144  Potassium 3.5 - 5.1 mmol/L 3.6 4.0 4.2  Chloride 98 - 111 mmol/L 102 105 111  CO2 22 - 32 mmol/L 27 27 25   Calcium 8.9 - 10.3 mg/dL 7.7(L) 7.6(L) 7.7(L)  Total Protein 6.5 - 8.1 g/dL - - -  Total Bilirubin 0.3 - 1.2 mg/dL - - -  Alkaline Phos 38 - 126 U/L - - -  AST 15 - 41 U/L - - -  ALT 0 - 44 U/L - - -    CXR: -  _______________________________________________________________  Assessment and Plan: POD 17 s/p emergency CABG.  Neuro: remains agitated on sedation CV:  Off milr.   Pulm: OK with trach per CCM.   Renal: making good urine.  Creat trending down. GI: on goal tube feeds. Heme: Stable 7.41(O.   Endo: Sliding scale insulin. Dispo: Continue ICU care.   10/10/2020 7:54 AM

## 2020-10-11 DIAGNOSIS — Z951 Presence of aortocoronary bypass graft: Secondary | ICD-10-CM | POA: Diagnosis not present

## 2020-10-11 DIAGNOSIS — I214 Non-ST elevation (NSTEMI) myocardial infarction: Secondary | ICD-10-CM | POA: Diagnosis not present

## 2020-10-11 DIAGNOSIS — I5021 Acute systolic (congestive) heart failure: Secondary | ICD-10-CM | POA: Diagnosis not present

## 2020-10-11 LAB — CBC
HCT: 25.8 % — ABNORMAL LOW (ref 39.0–52.0)
Hemoglobin: 8 g/dL — ABNORMAL LOW (ref 13.0–17.0)
MCH: 27.8 pg (ref 26.0–34.0)
MCHC: 31 g/dL (ref 30.0–36.0)
MCV: 89.6 fL (ref 80.0–100.0)
Platelets: 250 10*3/uL (ref 150–400)
RBC: 2.88 MIL/uL — ABNORMAL LOW (ref 4.22–5.81)
RDW: 17.6 % — ABNORMAL HIGH (ref 11.5–15.5)
WBC: 9.7 10*3/uL (ref 4.0–10.5)
nRBC: 0 % (ref 0.0–0.2)

## 2020-10-11 LAB — COOXEMETRY PANEL
Carboxyhemoglobin: 0.8 % (ref 0.5–1.5)
Methemoglobin: 1.1 % (ref 0.0–1.5)
O2 Saturation: 71.4 %
Total hemoglobin: 9.8 g/dL — ABNORMAL LOW (ref 12.0–16.0)

## 2020-10-11 LAB — GLUCOSE, CAPILLARY
Glucose-Capillary: 213 mg/dL — ABNORMAL HIGH (ref 70–99)
Glucose-Capillary: 140 mg/dL — ABNORMAL HIGH (ref 70–99)
Glucose-Capillary: 79 mg/dL (ref 70–99)
Glucose-Capillary: 163 mg/dL — ABNORMAL HIGH (ref 70–99)
Glucose-Capillary: 143 mg/dL — ABNORMAL HIGH (ref 70–99)

## 2020-10-11 LAB — BASIC METABOLIC PANEL
Anion gap: 7 (ref 5–15)
BUN: 83 mg/dL — ABNORMAL HIGH (ref 8–23)
CO2: 25 mmol/L (ref 22–32)
Calcium: 7.8 mg/dL — ABNORMAL LOW (ref 8.9–10.3)
Chloride: 105 mmol/L (ref 98–111)
Creatinine, Ser: 1.55 mg/dL — ABNORMAL HIGH (ref 0.61–1.24)
GFR, Estimated: 46 mL/min — ABNORMAL LOW (ref >60)
Glucose, Bld: 231 mg/dL — ABNORMAL HIGH (ref 70–99)
Potassium: 4 mmol/L (ref 3.5–5.1)
Sodium: 137 mmol/L (ref 135–145)

## 2020-10-11 MED ORDER — FUROSEMIDE 10 MG/ML IJ SOLN
40.0000 mg | Freq: Once | INTRAMUSCULAR | Status: AC
  Administered 2020-10-11: 40 mg via INTRAVENOUS
  Filled 2020-10-11: qty 4

## 2020-10-11 MED ORDER — PROSOURCE TF PO LIQD
45.0000 mL | Freq: Two times a day (BID) | ORAL | Status: DC
  Administered 2020-10-11 – 2020-10-19 (×17): 45 mL
  Filled 2020-10-11 (×18): qty 45

## 2020-10-11 MED ORDER — CHLORHEXIDINE GLUCONATE 0.12 % MT SOLN
OROMUCOSAL | Status: AC
  Filled 2020-10-11: qty 15

## 2020-10-11 MED ORDER — CHLORHEXIDINE GLUCONATE 0.12 % MT SOLN
OROMUCOSAL | Status: AC
  Administered 2020-10-11: 15 mL via OROMUCOSAL
  Filled 2020-10-11: qty 15

## 2020-10-11 NOTE — Progress Notes (Signed)
[address]       [place] [address]             [phone]                 18 Days Post-Op Procedure(s) (LRB): CORONARY ARTERY BYPASS GRAFTING (CABG)  TIMES 4 USING LEFT GREATER SAPHENOUS VEIN HARVESTED ENDOSCOPICALLY AND LEFT INTERNAL MAMMARY ARTERY (N/A) TRANSESOPHAGEAL ECHOCARDIOGRAM (TEE) (N/A)   Events: No events Working with PT this am Trached yesterday    _______________________________________________________________ Vitals: BP (!) 123/59   Pulse 70   Temp 99.1 F (37.3 C) (Oral)   Resp (!) 23   Ht 5\' 6"  (1.676 m)   Wt 80.4 kg   SpO2 100%   BMI 28.61 kg/m   - Neuro: awake  - Cardiovascular: Normal sinus rhythm.  I  Drips:  none CVP:  [7 mmHg] 7 mmHg  - Pulm:  Vent Mode: PSV;CPAP FiO2 (%):  [40 %-100 %] 40 % Set Rate:  [22 bmp] 22 bmp Vt Set:  [560 mL-650 mL] 650 mL PEEP:  [5 cmH20] 5 cmH20 Pressure Support:  [5 cmH20-10 cmH20] 5 cmH20 Plateau Pressure:  [15 cmH20-24 cmH20] 15 cmH20  ABG    Component Value Date/Time   PHART 7.410 09/26/2020 0539   PCO2ART 31.1 (L) 09/26/2020 0539   PO2ART 136 (H) 09/26/2020 0539   HCO3 19.9 (L) 09/26/2020 0539   TCO2 21 (L) 09/26/2020 0539   ACIDBASEDEF 4.0 (H) 09/26/2020 0539   O2SAT 71.4 10/11/2020 0335    - Abd: distended, but soft - Extremity: Warm  .Intake/Output      05/23 0701 05/24 0700 05/24 0701 05/25 0700   I.V. (mL/kg) 1512.6 (18.8)    NG/GT 1260    IV Piggyback 150    Total Intake(mL/kg) 2922.6 (36.4)    Urine (mL/kg/hr) 2075 (1.1)    Emesis/NG output 1250    Stool 0    Total Output 3325    Net -402.4         Stool Occurrence 0 x       _______________________________________________________________ Labs: CBC Latest Ref Rng & Units 10/11/2020 10/10/2020 10/09/2020  WBC 4.0 - 10.5 K/uL 9.7 6.8 8.3  Hemoglobin 13.0 - 17.0 g/dL 8.0(L) 9.1(L) 8.5(L)  Hematocrit 39.0 - 52.0 % 25.8(L) 29.2(L) 27.2(L)  Platelets 150 - 400 K/uL 250 230 233   CMP Latest Ref Rng &  Units 10/11/2020 10/10/2020 10/09/2020  Glucose 70 - 99 mg/dL 10/11/2020) 597(C) 163(A)  BUN 8 - 23 mg/dL 453(M) 46(O) 03(O)  Creatinine 0.61 - 1.24 mg/dL 12(Y) 4.82(N) 0.03(B)  Sodium 135 - 145 mmol/L 137 138 138  Potassium 3.5 - 5.1 mmol/L 4.0 3.6 4.0  Chloride 98 - 111 mmol/L 105 102 105  CO2 22 - 32 mmol/L 25 27 27   Calcium 8.9 - 10.3 mg/dL 7.8(L) 7.7(L) 7.6(L)  Total Protein 6.5 - 8.1 g/dL - - -  Total Bilirubin 0.3 - 1.2 mg/dL - - -  Alkaline Phos 38 - 126 U/L - - -  AST 15 - 41 U/L - - -  ALT 0 - 44 U/L - - -    CXR: -  _______________________________________________________________  Assessment and Plan: POD 18 s/p emergency CABG.  Neuro: remains agitated on sedation CV:  Off milr.   Pulm: wean vent as tolerated Renal: making good urine.  Creat trending down. GI: on goal tube feeds. Heme: Stable 0.48(G.   Endo: Sliding scale insulin. Dispo: Continue ICU care.  Corliss Skains 10/11/2020 8:20 AM

## 2020-10-11 NOTE — Progress Notes (Signed)
NAME:  Jack Vance, MRN:  [id number], DOB:  11/20/1940, LOS: 18 ADMISSION DATE:  09/23/2020, CONSULTATION DATE:  09/24/2019 REFERRING MD:  Jacinto Halim, CHIEF COMPLAINT:  Acute coronary syndrome   History of Present Illness:  80 year old man who presented with cardiogenic shock secondary to NSTEMI.  Presented with new onset RSCP and severe dyspnea. Found to have acute pulmonary edema and heart failure; intubated and sent for urgent LHC which revealed 3V CAD.  IABP inserted and patient underwent urgent CABG. IABP removed 5/9. Continues to require full mechanical ventilation support.  Pertinent  Medical History   Past Medical History:  Diagnosis Date  . Arthritis   . Asthma   . DDD (degenerative disc disease), lumbar   . Hernia of abdominal wall   . RLS (restless legs syndrome)    Significant Hospital Events: Including procedures, antibiotic start and stop dates in addition to other pertinent events   . 5/6 CABG with Dr Cliffton Asters with LIMA to LAD, SVG to RCA d and SVG to ramus. LV normal and returned to ICU with no vasopressor support.  . No post-operative bleeding but poor RV performance on hemodynamics.  . 5/8 significant hemodynamic instability with labile blood pressure and vasopressor requirements yesterday.  Episode of emesis but no evidence of bowel obstruction.  Evidence of pericardial tamponade. . 5/9 improved hemodynamic, able now to tolerate balloon pump weaning and removal. . 5/11 Stable hemodynamics, continuing diuresis. Improved UOP. TPN consult for initiation 5/12 in the setting of ileus, prolonged NPO status.  . 5/13 sedated, diuresis, milrinone, PICC line today, CVL removed  . 5/14 tolerated short SBT/SAT on Precedex . 5/16 Not tolerating SBT due to significant tachypnea to 50s, placed back on PRVC. Marland Kitchen 5/17 Improved SBT toleration, weaning milrinone, minimizing sedation. Bronched w/ c/f mucus plugging, airways clear, ETT exchanged due to significant buildup. Diuresis  held. . 5/18 More agitated overnight requiring increased sedation with addition of Fentanyl/Versed, Seroquel started. BUN continues to rise. Nephro consult.   5/19 Continues to be agitated overnight requiring increased sedation with fentanyl/versed. Started Klonopin, Seroquel and Requip yesterday. Started on valproate and seroquel increased.  5/20 Patient more interactive following commands and maintaining eyes open and tracking.  5/21 failing SBT's.  Plan for trach next week  5/23 Trach (BI/DS)  Interim History / Subjective:   Trach went well yesterday.  Remains on mechanical vent support.  Still on continuous Precedex for agitation control.  Objective   Blood pressure (!) 123/59, pulse 79, temperature 98.2 F (36.8 C), temperature source Oral, resp. rate (!) 23, height 5\' 6"  (1.676 m), weight 80.4 kg, SpO2 99 %. CVP:  [5 mmHg-7 mmHg] 7 mmHg  Vent Mode: PRVC FiO2 (%):  [40 %-100 %] 50 % Set Rate:  [22 bmp] 22 bmp Vt Set:  [560 mL] 560 mL PEEP:  [5 cmH20] 5 cmH20 Plateau Pressure:  [20 cmH20-24 cmH20] 20 cmH20   Intake/Output Summary (Last 24 hours) at 10/11/2020 0731 Last data filed at 10/11/2020 0700 Gross per 24 hour  Intake 2922.59 ml  Output 3325 ml  Net -402.41 ml   Filed Weights   10/09/20 0500 10/10/20 0500 10/11/20 0500  Weight: 81.5 kg 80 kg 80.4 kg   Physical Examination: BP (!) 123/59   Pulse 79   Temp 98.2 F (36.8 C) (Oral)   Resp (!) 23   Ht 5\' 6"  (1.676 m)   Wt 80.4 kg   SpO2 99%   BMI 28.61 kg/m    Gen:  Elderly male, no distress, trach in place on mechanical support HEENT: Trach in place no significant secretions, no bleeding Lungs:    Bilateral mechanically ventilated breaths CV:       regular rate rhythm, S1-S2 Abd:   Palpable ventral hernia, BS present soft nontender Ext: No significant edema Skin:     Warm dry no rash Neuro: Sedated on mechanical support  Labs/imaging that I have personally reviewed: (right click and "Reselect all  SmartList Selections" daily)   Sodium 137 Potassium 4.0 Serum creatinine 1.55 White blood cell count 9.7 Hemoglobin 8.0  Resolved Hospital Problem List   NSTEMI, Thrombocytopenia  Assessment & Plan:   Acute hypoxemic hypercapnic respiratory failure requiring mechanical ventilation. Prolonged vent weaning due to NM weakness. Possible aspiration Plan: Tracheostomy tube placed yesterday Switch to pressure support ventilation this morning. To continue to do well may consider early TCT Discussed with respiratory therapy  Critically ill due to RV dysfunction following ACS and cardiopulmonary bypass  Cardiogenic shock NSTEMI with culprit RCA involvement Coronary artery disease Plan: Continue amiodarone, diuresis with Lasix to maintain euvolemia Continue hydralazine and isosorbide for afterload reduction Appreciate cardiology input.  Acute kidney injury Uremia Hypernatremia Plan: Renal function improved Continue to observe urine output and follow kidney function  Gastric ileus, improved Plan: Continue tube feeds  Anemia (Baseline ~10) Plan: DVT prophylaxis Continue observe for any sign of bleeding  Agitated delirium controlled H/o alcohol abuse Plan: Observe for any worsening delirium Continue Seroquel, Klonopin plus valproic acid  RLS Plan: Continue Requip  DM Plan: Continue Lantus, SSI, CBGs  Best Practice: (right click and "Reselect all SmartList Selections" daily)  Diet:  Tube Feed   Pain/Anxiety/Delirium protocol (if indicated): Yes (RASS goal -1) VAP protocol (if indicated): Yes DVT prophylaxis: Subcutaneous Heparin GI prophylaxis: PPI Glucose control:  SSI Yes and Basal insulin Yes, Central venous access:  Yes, and it is still needed Arterial line:  N/A Foley:  Yes, and it is still needed Mobility:  bed rest  PT consulted: N/A Last date of multidisciplinary goals of care discussion [Defer to Primary] Code Status:  full code Disposition:  ICU  This patient is critically ill with multiple organ system failure; which, requires frequent high complexity decision making, assessment, support, evaluation, and titration of therapies. This was completed through the application of advanced monitoring technologies and extensive interpretation of multiple databases. During this encounter critical care time was devoted to patient care services described in this note for 32 minutes.    Josephine Igo, DO [facility] 10/11/2020 7:31 AM

## 2020-10-11 NOTE — Progress Notes (Signed)
TCTS BRIEF SICU PROGRESS NOTE  18 Days Post-Op  S/P Procedure(s) (LRB): CORONARY ARTERY BYPASS GRAFTING (CABG)  TIMES 4 USING LEFT GREATER SAPHENOUS VEIN HARVESTED ENDOSCOPICALLY AND LEFT INTERNAL MAMMARY ARTERY (N/A) TRANSESOPHAGEAL ECHOCARDIOGRAM (TEE) (N/A)   Stable day although somewhat agitated by report Didn't tolerate trach trials well NSR w/ 1st and 2nd degree AV block UOP adequate  Plan: Continue current plan  Purcell Nails, MD 10/11/2020 5:45 PM

## 2020-10-11 NOTE — Evaluation (Signed)
Physical Therapy Evaluation Patient Details Name: Jack Vance MRN: [id number] DOB: 10-Oct-1940 Today's Date: 10/11/2020   History of Present Illness  80 yo admitted 5/6 with cardiogenic shock secondary to NSTEMI with acute pulmonary edema and heart failure s/p urgent LHC. 5/6 IABP and CABG with IABP out 5/9. Pt remained intubated post op. Trach 5/23. PMhx: arthritis, asthma, DDD, RLS  Clinical Impression  Pt with trach on vent with initial limited eye opening but improved with sitting upright in foot egress. Pt with tight neck ROM with PROM for left rotation and trunk extension. Pt with improved awareness and interaction after upright positioning waving bye and appearing to give MD thumbs up at end of sitting. Pt with decreased cognition, strength, balance and function who currently requires 2 person assist for mobility and will benefit from acute therapy to maximize mobility, safety and function to decrease burden of care.   PRVC 40% FiO2, peep 5, SpO2 99%  HR 76-80    Follow Up Recommendations LTACH;Supervision/Assistance - 24 hour    Equipment Recommendations  Hospital bed    Recommendations for Other Services       Precautions / Restrictions Precautions Precautions: Fall;Sternal;Other (comment) Precaution Booklet Issued: No Precaution Comments: NG tube/suction, trach, vent, cortrak Restrictions Weight Bearing Restrictions: No Other Position/Activity Restrictions: sternal precautions      Mobility  Bed Mobility Overal bed mobility: Needs Assistance Bed Mobility: Supine to Sit;Sit to Supine;Rolling Rolling: Max assist   Supine to sit: Total assist Sit to supine: Total assist   General bed mobility comments: Total A for foot egress supine<>sit. Total assist to scoot pelvis forward at EOb with max assist for sitting balance and facilitation into trunk extension and rotation. max assist for neck ROM into left rotation in sitting. Pt sat EOB 11 min. Max A for rolling side to  side for RN to assess skin.    Transfers                 General transfer comment: not yet able  Ambulation/Gait                Stairs            Wheelchair Mobility    Modified Rankin (Stroke Patients Only)       Balance Overall balance assessment: Needs assistance Sitting-balance support: No upper extremity supported;Feet supported;Feet unsupported Sitting balance-Leahy Scale: Zero Sitting balance - Comments: overall max A to maintain sitting balance sitting at foot of bed with flexed trunk and varied anterior/posterior and right LOB Postural control: Posterior lean;Right lateral lean                                   Pertinent Vitals/Pain Pain Assessment: Faces Faces Pain Scale: Hurts little more Pain Location: neck with ROM stretching Pain Descriptors / Indicators: Grimacing Pain Intervention(s): Limited activity within patient's tolerance;Monitored during session;Repositioned    Home Living Family/patient expects to be discharged to:: Private residence Living Arrangements: Other relatives               Additional Comments: No family present and pt unable to report specifics of home setup at this time    Prior Function           Comments: pt unable to fully report PLOF, does nod "yes" when asked if he was able to take care of himself prior to hospitalization. Pt poor historian at this time and  also stating we are currently at his house. Per RN pt was living with sister but mobile     Hand Dominance        Extremity/Trunk Assessment   Upper Extremity Assessment Upper Extremity Assessment: Defer to OT evaluation RUE Deficits / Details: Noted resistance with attempts at ROM limiting flexion to 45*, difficult to assess if prior injury (though scar noted on anterior shoulder) due to cognition    Lower Extremity Assessment Lower Extremity Assessment: Generalized weakness (pt with automatic movement of knee flexion in  bed but not performing on command)    Cervical / Trunk Assessment Cervical / Trunk Assessment: Kyphotic Cervical / Trunk Exceptions: decreased L cervical ROM, tendency to have head turned to R side  Communication   Communication: Tracheostomy  Cognition Arousal/Alertness: Awake/alert Behavior During Therapy: Flat affect Overall Cognitive Status: No family/caregiver present to determine baseline cognitive functioning Area of Impairment: Orientation;Attention;Memory;Following commands;Safety/judgement;Awareness                 Orientation Level: Situation;Disoriented to Current Attention Level: Focused Memory: Decreased short-term memory Following Commands: Follows one step commands inconsistently;Follows one step commands with increased time Safety/Judgement: Decreased awareness of deficits Awareness: Intellectual Problem Solving: Slow processing;Requires verbal cues;Requires tactile cues;Decreased initiation;Difficulty sequencing General Comments: pt able to nod yes to name but nodding yes that he is at home. pt able to squeeze bil hands on command with delay and will open eyes when cued. Decreased attention, HOH and trach also make cognitive assessment challenging      General Comments General comments (skin integrity, edema, etc.): SpO2 WFL on PRVC vent, PEEP 5 and 40% FiO2. HR 70s-80s during activity    Exercises     Assessment/Plan    PT Assessment Patient needs continued PT services  PT Problem List Decreased strength;Decreased mobility;Decreased safety awareness;Decreased activity tolerance;Decreased balance;Decreased knowledge of use of DME;Cardiopulmonary status limiting activity;Decreased cognition       PT Treatment Interventions DME instruction;Therapeutic exercise;Functional mobility training;Therapeutic activities;Patient/family education;Balance training;Neuromuscular re-education;Cognitive remediation    PT Goals (Current goals can be found in the Care Plan  section)  Acute Rehab PT Goals Patient Stated Goal: none stated PT Goal Formulation: Patient unable to participate in goal setting Time For Goal Achievement: 10/25/20 Potential to Achieve Goals: Fair    Frequency Min 3X/week   Barriers to discharge Decreased caregiver support      Co-evaluation PT/OT/SLP Co-Evaluation/Treatment: Yes Reason for Co-Treatment: Complexity of the patient's impairments (multi-system involvement);For patient/therapist safety PT goals addressed during session: Mobility/safety with mobility;Balance OT goals addressed during session: ADL's and self-care;Strengthening/ROM       AM-PAC PT "6 Clicks" Mobility  Outcome Measure Help needed turning from your back to your side while in a flat bed without using bedrails?: A Lot Help needed moving from lying on your back to sitting on the side of a flat bed without using bedrails?: Total Help needed moving to and from a bed to a chair (including a wheelchair)?: Total Help needed standing up from a chair using your arms (e.g., wheelchair or bedside chair)?: Total Help needed to walk in hospital room?: Total Help needed climbing 3-5 steps with a railing? : Total 6 Click Score: 7    End of Session   Activity Tolerance: Patient tolerated treatment well Patient left: in bed;with call bell/phone within reach;with nursing/sitter in room;with bed alarm set Nurse Communication: Mobility status;Need for lift equipment;Precautions PT Visit Diagnosis: Other abnormalities of gait and mobility (R26.89);Muscle weakness (generalized) (M62.81)  Time: 0721-0759 PT Time Calculation (min) (ACUTE ONLY): 38 min   Charges:   PT Evaluation $PT Eval High Complexity: 1 High PT Treatments $Therapeutic Activity: 8-22 mins        Twan Harkin P, PT Acute Rehabilitation Services Pager: [phone] Office: [phone]   Enedina Finner Denvil Canning 10/11/2020, 10:47 AM

## 2020-10-11 NOTE — Evaluation (Addendum)
Occupational Therapy Evaluation Patient Details Name: Jack Vance MRN: [id number] DOB: 02-Dec-1940 Today's Date: 10/11/2020    History of Present Illness 80 yo admitted 5/6 with cardiogenic shock secondary to NSTEMI with acute pulmonary edema and heart failure s/p urgent LHC. 5/6 IABP and CABG with IABP out 5/9. Pt remained intubated post op. Trach 5/23. PMhx: arthritis, asthma, DDD, RLS   Clinical Impression   Pt presents with diagnoses above and deficits in cognition, strength, sitting balance, and endurance. Pt with inconsistent following of commands, but demo some appropriate responses (nodding head "yes", small wave goodbye to therapists). Guided pt in foot eggress with pt requiring Max A to maintain static sitting balance with R lateral lean tendency. Pt able to sit for 11 minutes before fatigued and returned to bed. Pt also noted with deficits in cervical ROM, as well as resistance for R shoulder ROM. Pt requires Total A for all ADLs at this time. Currently recommend LTACH at discharge. Will continue to follow pt acutely to progress cognition, sitting balance and ADL participation.   PRVC 40% FiO2, peep 5, SpO2 99% HR 76-80    Follow Up Recommendations  [facility]    Equipment Recommendations  Hospital bed;Other (comment) (to be continually assessed)    Recommendations for Other Services       Precautions / Restrictions Precautions Precautions: Fall;Sternal;Other (comment) Precaution Booklet Issued: No (verbally educated - will need reinforcement) Precaution Comments: NG tube/suction, trach, vent Restrictions Weight Bearing Restrictions: No Other Position/Activity Restrictions: sternal precautions      Mobility Bed Mobility Overal bed mobility: Needs Assistance Bed Mobility: Rolling Rolling: Max assist         General bed mobility comments: Total A for foot eggress via bed controls to facilitate sitting balance assessment. Max A for rolling side to side for RN to assess  skin.    Transfers                      Balance Overall balance assessment: Needs assistance Sitting-balance support: No upper extremity supported;Feet supported;Feet unsupported Sitting balance-Leahy Scale: Poor Sitting balance - Comments: overall max A to maintain sitting balance sitting at foot of bed, minimal initiation noted to correct balance Postural control: Right lateral lean                                 ADL either performed or assessed with clinical judgement   ADL Overall ADL's : Needs assistance/impaired                                       General ADL Comments: Overall Total A for ADLs bed level at this time     Vision Baseline Vision/History: Wears glasses Wears Glasses: At all times Vision Assessment?: No apparent visual deficits (to be further assessed)     Perception     Praxis      Pertinent Vitals/Pain Pain Assessment: Faces Faces Pain Scale: Hurts little more Pain Location: neck with ROM stretching Pain Descriptors / Indicators: Grimacing Pain Intervention(s): Monitored during session;Repositioned     Hand Dominance     Extremity/Trunk Assessment Upper Extremity Assessment Upper Extremity Assessment: Generalized weakness;RUE deficits/detail RUE Deficits / Details: Noted resistance with attempts at ROM limiting flexion to 45*, difficult to assess if prior injury (though scar noted on anterior shoulder) due to cognition  Lower Extremity Assessment Lower Extremity Assessment: Defer to PT evaluation   Cervical / Trunk Assessment Cervical / Trunk Assessment: Kyphotic;Other exceptions Cervical / Trunk Exceptions: decreased L cervical ROM, tendency to have head turned to R side   Communication Communication Communication: Tracheostomy   Cognition Arousal/Alertness: Awake/alert;Lethargic Behavior During Therapy: Flat affect Overall Cognitive Status: No family/caregiver present to determine baseline  cognitive functioning Area of Impairment: Orientation;Attention;Memory;Following commands;Safety/judgement;Awareness;Problem solving                 Orientation Level: Disoriented to;Place;Time;Situation Current Attention Level: Focused Memory: Decreased short-term memory;Decreased recall of precautions Following Commands: Follows one step commands inconsistently;Follows one step commands with increased time Safety/Judgement: Decreased awareness of deficits Awareness: Intellectual Problem Solving: Slow processing;Decreased initiation;Difficulty sequencing;Requires verbal cues;Requires tactile cues General Comments: Pt responds to name, able to demo some appropriate responses (nodding head, shrugging shoulders when asked questions and waving goodbye to therapists) but inconsistent following purposeful commands. When asked if pt knows where he is, pt nods yes but when asked if he is at home, pt also nods yes. Reoriented to place, situation and heart surgery   General Comments  SpO2 WFL on PRVC vent, PEEP 5 and 40% FiO2. HR 70s-80s during activity    Exercises     Shoulder Instructions      Home Living Family/patient expects to be discharged to:: Private residence Living Arrangements: Other relatives (younger sister)                               Additional Comments: No family present and pt unable to report specifics of home setup at this time      Prior Functioning/Environment          Comments: pt unable to fully report PLOF, does nod "yes" when asked if he was able to take care of himself prior to hospitalization. Pt poor historian at this time        OT Problem List: Decreased strength;Decreased activity tolerance;Impaired balance (sitting and/or standing);Decreased coordination;Decreased cognition;Decreased safety awareness;Decreased knowledge of precautions      OT Treatment/Interventions: Self-care/ADL training;Therapeutic exercise;Therapeutic  activities;Patient/family education;Balance training;Energy conservation;DME and/or AE instruction    OT Goals(Current goals can be found in the care plan section) Acute Rehab OT Goals Patient Stated Goal: none stated OT Goal Formulation: Patient unable to participate in goal setting Time For Goal Achievement: 10/25/20 Potential to Achieve Goals: Good ADL Goals Pt Will Perform Grooming: with mod assist;sitting Pt Will Perform Upper Body Bathing: with max assist;sitting Additional ADL Goal #1: Pt to demo ability to follow one step commands > 30% of the time to increase ADL participation Additional ADL Goal #2: Pt to improve sitting balance to require no more than Min A > 5 min during functional tasks  OT Frequency: Min 2X/week   Barriers to D/C:            Co-evaluation PT/OT/SLP Co-Evaluation/Treatment: Yes Reason for Co-Treatment: Complexity of the patient's impairments (multi-system involvement);Necessary to address cognition/behavior during functional activity;For patient/therapist safety   OT goals addressed during session: ADL's and self-care;Strengthening/ROM      AM-PAC OT "6 Clicks" Daily Activity     Outcome Measure Help from another person eating meals?: Total Help from another person taking care of personal grooming?: Total Help from another person toileting, which includes using toliet, bedpan, or urinal?: Total Help from another person bathing (including washing, rinsing, drying)?: Total Help from another person to put on  and taking off regular upper body clothing?: Total Help from another person to put on and taking off regular lower body clothing?: Total 6 Click Score: 6   End of Session Equipment Utilized During Treatment: Oxygen Nurse Communication: Mobility status (RN present during session)  Activity Tolerance: Patient tolerated treatment well;Patient limited by fatigue Patient left: in bed;with call bell/phone within reach;with bed alarm set  OT Visit  Diagnosis: Muscle weakness (generalized) (M62.81);Other abnormalities of gait and mobility (R26.89);Other symptoms and signs involving cognitive function                Time: 8527-7824 OT Time Calculation (min): 38 min Charges:  OT General Charges $OT Visit: 1 Visit OT Evaluation $OT Eval High Complexity: 1 High  Bradd Canary, OTR/L Acute Rehab Services Office: [phone]  Lorre Munroe 10/11/2020, 8:33 AM

## 2020-10-11 NOTE — Progress Notes (Addendum)
Patient ID: Jack Vance, male   DOB: [birth year], 80 y.o.   MRN: [id number]    Advanced Heart Failure Rounding Note   Subjective:    Underwent emergent CABG on 5/6 for critical LM disease (LIMA -> LAD, SVG -> OM, SVG -> RCA) 5/6 Cultures negative.  5/8 Possible aspiration. A fib --> started amio drip.  Given 2u PRBCs. Hypotensive in the evening and started on vasopressin + antibiotics.  5/9 IABP removed 5/10 NG placed >1.5 out  5/11 started neostigmime 5/12 Febrile 102 5/17 Failed SBT. Underwent bronchoscopy for attempted therapeutic aspiration for mucus plugging. ETT exchanged.  5/19 Hgb 6.1 given 1UPRBC 5/21  Failed SBT  5/23 Trach.   CO-OX 71%   Awake on vent. Follows commands.   Objective:   Weight Range:  Vital Signs:   Temp:  [97.5 F (36.4 C)-101.1 F (38.4 C)] 99.1 F (37.3 C) (05/24 0733) Pulse Rate:  [56-79] 70 (05/24 0738) Resp:  [11-31] 23 (05/24 0600) BP: (81-143)/(39-93) 123/59 (05/24 0600) SpO2:  [78 %-100 %] 100 % (05/24 0738) FiO2 (%):  [40 %-100 %] 40 % (05/24 0800) Weight:  [80.4 kg] 80.4 kg (05/24 0500) Last BM Date: 10/08/20  Weight change: Filed Weights   10/09/20 0500 10/10/20 0500 10/11/20 0500  Weight: 81.5 kg 80 kg 80.4 kg    Intake/Output:   Intake/Output Summary (Last 24 hours) at 10/11/2020 0844 Last data filed at 10/11/2020 0700 Gross per 24 hour  Intake 2922.59 ml  Output 3325 ml  Net -402.41 ml   CVP 10-11 PHYSICAL EXAM: General:   HEENT: NG tube  Neck: trach, supple. JVP difficult to assess.  Carotids 2+ bilat; no bruits. No lymphadenopathy or thryomegaly appreciated. Cor: PMI nondisplaced. Regular rate & rhythm. No rubs, gallops or murmurs. Lungs: clear Abdomen: soft, nontender, nondistended. No hepatosplenomegaly. No bruits or masses. Good bowel sounds. Extremities: no cyanosis, clubbing, rash, edema Neuro: Awake on vent follows commands. MAE x4  Telemetry: SR 1st degree heart block  Labs: Basic Metabolic  Panel: Recent Labs  Lab 10/06/20 0456 10/07/20 0327 10/07/20 1428 10/08/20 0338 10/09/20 0450 10/10/20 0342 10/11/20 0335  NA 145 147* 149* 144 138 138 137  K 3.9 4.3 4.0 4.2 4.0 3.6 4.0  CL 118* 118* 118* 111 105 102 105  CO2 $Re'22 22 25 25 27 27 25  'rBm$ GLUCOSE 227* 159* 132* 192* 158* 139* 231*  BUN 127* 116* 112* 104* 92* 93* 83*  CREATININE 2.51* 2.36* 2.32* 2.10* 1.86* 1.92* 1.55*  CALCIUM 7.3* 7.7* 7.7* 7.7* 7.6* 7.7* 7.8*  MG 2.6* 2.5*  --   --   --   --   --   PHOS 3.2  --   --   --   --   --   --     Liver Function Tests: Recent Labs  Lab 10/06/20 0456  AST 25  ALT 23  ALKPHOS 46  BILITOT 0.7  PROT 5.0*  ALBUMIN 1.5*   No results for input(s): LIPASE, AMYLASE in the last 168 hours. No results for input(s): AMMONIA in the last 168 hours.  CBC: Recent Labs  Lab 10/07/20 0327 10/08/20 0338 10/09/20 0450 10/10/20 0342 10/11/20 0335  WBC 7.1 6.7 8.3 6.8 9.7  HGB 8.1* 10.3* 8.5* 9.1* 8.0*  HCT 25.5* 33.1* 27.2* 29.2* 25.8*  MCV 89.2 89.0 88.9 88.8 89.6  PLT 211 202 233 230 250    Cardiac Enzymes: No results for input(s): CKTOTAL, CKMB, CKMBINDEX, TROPONINI in the last 168 hours.  BNP: BNP (last 3 results) Recent Labs    09/23/20 0311  BNP 1,191.8*    ProBNP (last 3 results) No results for input(s): PROBNP in the last 8760 hours.    Other results:  Imaging: DG Chest Port 1 View  Result Date: 10/10/2020 CLINICAL DATA:  Status post tracheostomy placement EXAM: PORTABLE CHEST 1 VIEW COMPARISON:  10/09/2020 FINDINGS: Tracheostomy tube is now seen in satisfactory position. Gastric catheter and feeding catheter are noted. Left-sided PICC line is again seen and stable. Cardiac shunt eyes is stable. Small effusions and basilar atelectasis are seen bilaterally. IMPRESSION: Tracheostomy tube in satisfactory position. Bibasilar atelectasis and effusions. Electronically Signed   By: Inez Catalina M.D.   On: 10/10/2020 12:23     Medications:     Scheduled  Medications: . amiodarone  200 mg Per Tube Daily  . aspirin  324 mg Per Tube Daily  . atorvastatin  80 mg Per Tube Daily  . chlorhexidine      . chlorhexidine gluconate (MEDLINE KIT)  15 mL Mouth Rinse BID  . Chlorhexidine Gluconate Cloth  6 each Topical Daily  . clonazePAM  1 mg Per Tube BID  . feeding supplement (PIVOT 1.5 CAL)  1,000 mL Per Tube Q24H  . folic acid  1 mg Per Tube Daily  . heparin injection (subcutaneous)  5,000 Units Subcutaneous Q8H  . hydrALAZINE  25 mg Per Tube Q8H  . insulin aspart  0-24 Units Subcutaneous Q4H  . insulin aspart  6 Units Subcutaneous Q4H  . insulin glargine  5 Units Subcutaneous BID  . ipratropium-albuterol  3 mL Nebulization BID  . isosorbide dinitrate  10 mg Per Tube TID  . mouth rinse  15 mL Mouth Rinse 10 times per day  . metoCLOPramide (REGLAN) injection  5 mg Intravenous Q6H  . pantoprazole sodium  40 mg Per Tube Daily  . polyethylene glycol  17 g Per Tube BID  . QUEtiapine  100 mg Per Tube BID  . rOPINIRole  3 mg Per Tube TID  . sodium chloride flush  10-40 mL Intracatheter Q12H  . sodium chloride flush  3 mL Intravenous Q12H  . thiamine  100 mg Per Tube Daily  . valproic acid  250 mg Per Tube BID    Infusions: . dexmedetomidine (PRECEDEX) IV infusion 0.7 mcg/kg/hr (10/11/20 0600)  . dextrose 50 mL/hr at 10/11/20 0600  . fentaNYL infusion INTRAVENOUS Stopped (10/01/20 0024)  . lactated ringers Stopped (10/01/20 6063)  . norepinephrine (LEVOPHED) Adult infusion Stopped (10/10/20 1202)  . propofol (DIPRIVAN) infusion Stopped (10/10/20 1059)    PRN Medications: acetaminophen (TYLENOL) oral liquid 160 mg/5 mL, albuterol, fentaNYL (SUBLIMAZE) injection, lip balm, metoprolol tartrate, midazolam, ondansetron (ZOFRAN) IV, sodium chloride flush, sodium chloride flush, sorbitol   Assessment/Plan:   1. 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  - Co-ox 71%. Stable off milrinone.   - CVP 10. Give 40 mg IV lasix x1.  - No ACE/ARB/ARNI/MRA with AKI. No b-blocker yet with shock.  - c/w hydral/nitrates for afterload reduction   2. NSTEMI with critical CAD - 95% LM and high grade ostial RCA - CABG on 5/6 for critical LM disease (LIMA -> LAD, SVG -> OM, SVG -> RCA) - Continue ASA.  - Continue statin.  - Add Plavix soon   3. Acute hypoxic respiratory failure  due to pulmonary edema - on vent - Possible aspiration 5/8 & 5/10  - 5/6 Blood CX - NG  - Antibiotics completed 5/16 - CCM managing--> S/P trach  - Failed SBT 5/21   4. AKI / Uremia  - due to ATN - Cr peaked 3.5->1.5 today.   5. Ventral Hernia  - 5/10 NG placed to decompress   6. ID - Finished zosyn for possible aspiration 5/16.  - Blood CX 5/6- NGTD, UC 5/13 NGTD  Febrile last night.    7. Anemia - Given 1 unit PRBC 5/19. - Hgb 8 , transfuse < 8.   8. Hypernatremia -Sodium 137  9. Atrial fibrillation - Transient, appears sinus today with occasional episodes of Wenckebach periodicity.    - Continue amio back to 200 mg daily.    Length of Stay: [place], NP  8:44 AM   Agree with above.   Underwent trach yesterday. Awake on vent following commands but still with periods of agitation. Off milrinone. Co-ox 71%  CVP 10-11. Scr down to 1.55  General:  Awake on vent through trach following some commands HEENT: normal + cor-trak Neck: supple. + trach Carotids 2+ bilat; no bruits. No lymphadenopathy or thryomegaly appreciated. Cor: PMI nondisplaced. Regular rate & rhythm. No rubs, gallops or murmurs. Lungs: clear Abdomen: soft, nontender, nondistended. No hepatosplenomegaly. No bruits or masses. Good bowel sounds. Extremities: no cyanosis, clubbing, rash, edema Neuro: alert & orientedx3, cranial nerves grossly intact. moves all 4 extremities w/o difficulty. Affect  pleasant  Now s/p trach. Hopefully mental status continues to improve and we can wean as he gets stronger.  He is off milrinone with stable co-ox. Continue hydral/nitrates. One dose of lasix ok.   If Scr stable will add ARNI or ABR in am.   CRITICAL CARE Performed by: Glori Bickers  Total critical care time: 35 minutes  Critical care time was exclusive of separately billable procedures and treating other patients.  Critical care was necessary to treat or prevent imminent or life-threatening deterioration.  Critical care was time spent personally by me (independent of midlevel providers or residents) on the following activities: development of treatment plan with patient and/or surrogate as well as nursing, discussions with consultants, evaluation of patient's response to treatment, examination of patient, obtaining history from patient or surrogate, ordering and performing treatments and interventions, ordering and review of laboratory studies, ordering and review of radiographic studies, pulse oximetry and re-evaluation of patient's condition.  Glori Bickers, MD  11:17 AM

## 2020-10-11 NOTE — Progress Notes (Signed)
SLP Cancellation Note  Patient Details Name: Jack Vance MRN: [id number] DOB: [birth year]   Cancelled treatment:       Reason Eval/Treat Not Completed: Other (comment) Patient with new tracheostomy on previous date. Orders for SLP eval and treat for PMSV and swallowing received. Pt starting to wean today and may be appropriate for TC soon per MD note. Note that he is also starting to follow some commands. Will follow pt closely for readiness for SLP interventions as appropriate.      Mahala Menghini., M.A. CCC-SLP Acute Rehabilitation Services Pager [phone] Office [phone]  10/11/2020, 11:24 AM

## 2020-10-11 NOTE — Progress Notes (Addendum)
Nutrition Follow Up  DOCUMENTATION CODES:   Not applicable  INTERVENTION:   Continue tube feeding  -Pivot 1.5 @ 60 ml/hr via Cortrak (1440 ml) -ProSource TF 45 ml BID   Goal rate provides: 2240 kcals, 157 grams protein, 1093 ml free water.   NUTRITION DIAGNOSIS:   Increased nutrient needs related to post-op healing as evidenced by estimated needs.  Ongoing  GOAL:   Patient will meet greater than or equal to 90% of their needs   Meeting with TF  MONITOR:   Vent status,Labs,I & O's,Skin,TF tolerance,Weight trends  REASON FOR ASSESSMENT:   Consult Enteral/tube feeding initiation and management  ASSESSMENT:   Patient with PMH significant for DDD, asthma, HTN, and large ventral hernia. Presented to [facility] ED with shortness of breath, was found to be in cardiogenic shock with NSTEMI.   5/06- s/p heart cath, confirmed critical CAD, s/p emergent CABG x3 wih IABP placement 5/08- large vomiting episode, TF held 5/09- s/p IABP removed, trickle TF started  5/10- large vomiting episode, TF held 5/11- Cortrak tube advanced to ligament of Treitz 5/12- TPN started  5/14- Pivot 1.5 restarted at 10 ml/hr  5/16- Pivot 1.5 advanced to 20 ml/hr 5/17- s/p bronch showed c/f mucus plugging, ETT exchanged  5/21- TPN stopped, Pivot 1.5 @ goal rate   5/23- s/p trach  Continues on vent support, may start trach collar trials soon. Following some commands. Renal function improved. Tolerating tube feeding at goal rate. Still having increased NG output. Having consistent stool output with rectal tube.   Admission weight: 83 kg  Current weight: 80.4 kg   Patient requiring ventilator support via trach MV: 13.8 L/min Temp (24hrs), Avg:99.1 F (37.3 C), Min:98.2 F (36.8 C), Max:101.1 F (38.4 C)   UOP: 2075 ml x 24 hrs NG: 1250 ml x 24 hrs   Medications: folic acid, SS novolog, lantus, 5 mg reglan QID, miralax, thiamine Labs: Cr 1.55- trending down CBG 79-213  Diet Order:   Diet Order             Diet NPO time specified  Diet effective midnight                 EDUCATION NEEDS:   Not appropriate for education at this time  Skin:  Skin Assessment: Skin Integrity Issues: Skin Integrity Issues:: Stage II: buttocks  Incisions: bilateral legs, chest  Last BM:  5/24  Height:   Ht Readings from Last 1 Encounters:  09/23/20 5\' 6"  (1.676 m)    Weight:   Wt Readings from Last 1 Encounters:  10/11/20 80.4 kg   BMI:  Body mass index is 28.61 kg/m.  Estimated Nutritional Needs:   Kcal:  10/13/20 kcal  Protein:  135-180 grams  Fluid:  >/= 2 L/day  [phone] RD, LDN Clinical Nutrition Pager listed in [facility]ION

## 2020-10-12 DIAGNOSIS — I5043 Acute on chronic combined systolic (congestive) and diastolic (congestive) heart failure: Secondary | ICD-10-CM | POA: Diagnosis not present

## 2020-10-12 DIAGNOSIS — I5021 Acute systolic (congestive) heart failure: Secondary | ICD-10-CM | POA: Diagnosis not present

## 2020-10-12 DIAGNOSIS — N179 Acute kidney failure, unspecified: Secondary | ICD-10-CM | POA: Diagnosis not present

## 2020-10-12 DIAGNOSIS — I214 Non-ST elevation (NSTEMI) myocardial infarction: Secondary | ICD-10-CM | POA: Diagnosis not present

## 2020-10-12 LAB — BASIC METABOLIC PANEL
Anion gap: 10 (ref 5–15)
BUN: 93 mg/dL — ABNORMAL HIGH (ref 8–23)
CO2: 26 mmol/L (ref 22–32)
Calcium: 8.5 mg/dL — ABNORMAL LOW (ref 8.9–10.3)
Chloride: 107 mmol/L (ref 98–111)
Creatinine, Ser: 1.98 mg/dL — ABNORMAL HIGH (ref 0.61–1.24)
GFR, Estimated: 34 mL/min — ABNORMAL LOW (ref >60)
Glucose, Bld: 145 mg/dL — ABNORMAL HIGH (ref 70–99)
Potassium: 3.9 mmol/L (ref 3.5–5.1)
Sodium: 143 mmol/L (ref 135–145)

## 2020-10-12 LAB — GLUCOSE, CAPILLARY
Glucose-Capillary: 136 mg/dL — ABNORMAL HIGH (ref 70–99)
Glucose-Capillary: 137 mg/dL — ABNORMAL HIGH (ref 70–99)
Glucose-Capillary: 140 mg/dL — ABNORMAL HIGH (ref 70–99)
Glucose-Capillary: 143 mg/dL — ABNORMAL HIGH (ref 70–99)
Glucose-Capillary: 150 mg/dL — ABNORMAL HIGH (ref 70–99)
Glucose-Capillary: 163 mg/dL — ABNORMAL HIGH (ref 70–99)
Glucose-Capillary: 95 mg/dL (ref 70–99)

## 2020-10-12 LAB — COOXEMETRY PANEL
Carboxyhemoglobin: 1.1 % (ref 0.5–1.5)
Methemoglobin: 1.1 % (ref 0.0–1.5)
O2 Saturation: 69.5 %
Total hemoglobin: 9.3 g/dL — ABNORMAL LOW (ref 12.0–16.0)

## 2020-10-12 LAB — CBC
HCT: 27.8 % — ABNORMAL LOW (ref 39.0–52.0)
Hemoglobin: 8.7 g/dL — ABNORMAL LOW (ref 13.0–17.0)
MCH: 28.1 pg (ref 26.0–34.0)
MCHC: 31.3 g/dL (ref 30.0–36.0)
MCV: 89.7 fL (ref 80.0–100.0)
Platelets: 323 10*3/uL (ref 150–400)
RBC: 3.1 MIL/uL — ABNORMAL LOW (ref 4.22–5.81)
RDW: 18 % — ABNORMAL HIGH (ref 11.5–15.5)
WBC: 11.7 10*3/uL — ABNORMAL HIGH (ref 4.0–10.5)
nRBC: 0 % (ref 0.0–0.2)

## 2020-10-12 MED ORDER — CLOPIDOGREL BISULFATE 75 MG PO TABS
75.0000 mg | ORAL_TABLET | Freq: Every day | ORAL | Status: DC
  Administered 2020-10-12 – 2020-10-20 (×9): 75 mg
  Filled 2020-10-12 (×9): qty 1

## 2020-10-12 MED ORDER — OXYCODONE HCL 5 MG PO TABS
10.0000 mg | ORAL_TABLET | Freq: Four times a day (QID) | ORAL | Status: DC | PRN
  Administered 2020-10-13 – 2020-10-19 (×14): 10 mg
  Filled 2020-10-12 (×14): qty 2

## 2020-10-12 MED ORDER — CHLORHEXIDINE GLUCONATE 0.12 % MT SOLN
OROMUCOSAL | Status: AC
  Administered 2020-10-12: 15 mL via OROMUCOSAL
  Filled 2020-10-12: qty 15

## 2020-10-12 MED ORDER — POLYETHYLENE GLYCOL 3350 17 G PO PACK
17.0000 g | PACK | Freq: Every day | ORAL | Status: DC
  Administered 2020-10-12 – 2020-10-20 (×7): 17 g
  Filled 2020-10-12 (×7): qty 1

## 2020-10-12 MED ORDER — HYDRALAZINE HCL 25 MG PO TABS
37.5000 mg | ORAL_TABLET | Freq: Three times a day (TID) | ORAL | Status: DC
  Administered 2020-10-12 – 2020-10-20 (×25): 37.5 mg
  Filled 2020-10-12 (×23): qty 2

## 2020-10-12 MED ORDER — FENTANYL CITRATE (PF) 100 MCG/2ML IJ SOLN
50.0000 ug | INTRAMUSCULAR | Status: DC | PRN
  Administered 2020-10-14 – 2020-10-20 (×16): 50 ug via INTRAVENOUS
  Filled 2020-10-12 (×16): qty 2

## 2020-10-12 MED ORDER — CLONAZEPAM 0.5 MG PO TABS
0.5000 mg | ORAL_TABLET | Freq: Two times a day (BID) | ORAL | Status: DC
  Administered 2020-10-12 – 2020-10-15 (×8): 0.5 mg
  Filled 2020-10-12 (×9): qty 1

## 2020-10-12 MED ORDER — ROPINIROLE HCL 0.5 MG PO TABS
0.5000 mg | ORAL_TABLET | Freq: Every day | ORAL | Status: DC
  Administered 2020-10-12 – 2020-10-19 (×8): 0.5 mg
  Filled 2020-10-12 (×9): qty 1

## 2020-10-12 MED ORDER — QUETIAPINE FUMARATE 50 MG PO TABS
150.0000 mg | ORAL_TABLET | Freq: Two times a day (BID) | ORAL | Status: DC
  Administered 2020-10-12 – 2020-10-13 (×4): 150 mg
  Filled 2020-10-12 (×4): qty 1

## 2020-10-12 MED ORDER — OXYCODONE HCL 5 MG/5ML PO SOLN: 10.0000 mg | Freq: Four times a day (QID) | ORAL | Status: DC | PRN

## 2020-10-12 NOTE — Progress Notes (Signed)
SLP Cancellation Note  Patient Details Name: Jack Vance MRN: [id number] DOB: [birth year]   Cancelled treatment:       Reason Eval/Treat Not Completed: Patient not medically ready. SLP and RT coordinated this morning to try to complete PMV eval either inline or if able, on TC. Pt had been weaning on the vent for two hours though and was tachypneic upon RT arrival. PMV held at this time - will continue to follow for trials.    Mahala Menghini., M.A. CCC-SLP Acute Rehabilitation Services Pager [phone] Office [phone]  10/12/2020, 10:49 AM

## 2020-10-12 NOTE — Progress Notes (Addendum)
Patient ID: JAKE FUHRMANN, male   DOB: [birth year], 80 y.o.   MRN: [id number]    Advanced Heart Failure Rounding Note   Subjective:    Underwent emergent CABG on 5/6 for critical LM disease (LIMA -> LAD, SVG -> OM, SVG -> RCA) 5/6 Cultures negative.  5/8 Possible aspiration. A fib --> started amio drip.  Given 2u PRBCs. Hypotensive in the evening and started on vasopressin + antibiotics.  5/9 IABP removed 5/10 NG placed >1.5 out  5/11 started neostigmime 5/12 Febrile 102 5/17 Failed SBT. Underwent bronchoscopy for attempted therapeutic aspiration for mucus plugging. ETT exchanged.  5/19 Hgb 6.1 given 1UPRBC 5/21  Failed SBT  5/23 Trach.   CO-OX 69.5 %.   Awake on vent.   Objective:   Weight Range:  Vital Signs:   Temp:  [97.7 F (36.5 C)-99.7 F (37.6 C)] 97.7 F (36.5 C) (05/25 0400) Pulse Rate:  [70-82] 80 (05/24 1932) Resp:  [12-33] 22 (05/25 0600) BP: (84-184)/(49-108) 138/60 (05/25 0600) SpO2:  [98 %-100 %] 100 % (05/25 0600) FiO2 (%):  [40 %] 40 % (05/25 0400) Last BM Date: 10/11/20  Weight change: Filed Weights   10/09/20 0500 10/10/20 0500 10/11/20 0500  Weight: 81.5 kg 80 kg 80.4 kg    Intake/Output:   Intake/Output Summary (Last 24 hours) at 10/12/2020 0727 Last data filed at 10/12/2020 0600 Gross per 24 hour  Intake 1510.22 ml  Output 2225 ml  Net -714.78 ml   CVP 7  PHYSICAL EXAM: General:  Awake on vent via trach  HEENT: +Cortrak and NG tube.  Neck: trach, supple. no JVD. Carotids 2+ bilat; no bruits. No lymphadenopathy or thryomegaly appreciated. Cor: PMI nondisplaced. Regular rate & rhythm. No rubs, gallops or murmurs. Lungs: clear Abdomen: soft, nontender, hernia,  nondistended. No hepatosplenomegaly. No bruits or masses. Good bowel sounds. Extremities: no cyanosis, clubbing, rash, edema Neuro: Awake on vent. MAE. Follows commands.   Telemetry: SR with occasional PVCs.  Labs: Basic Metabolic Panel: Recent Labs  Lab 10/06/20 0456  10/07/20 0327 10/07/20 1428 10/08/20 0338 10/09/20 0450 10/10/20 0342 10/11/20 0335 10/12/20 0344  NA 145 147*   < > 144 138 138 137 143  K 3.9 4.3   < > 4.2 4.0 3.6 4.0 3.9  CL 118* 118*   < > 111 105 102 105 107  CO2 22 22   < > $R'25 27 27 25 26  'Xg$ GLUCOSE 227* 159*   < > 192* 158* 139* 231* 145*  BUN 127* 116*   < > 104* 92* 93* 83* 93*  CREATININE 2.51* 2.36*   < > 2.10* 1.86* 1.92* 1.55* 1.98*  CALCIUM 7.3* 7.7*   < > 7.7* 7.6* 7.7* 7.8* 8.5*  MG 2.6* 2.5*  --   --   --   --   --   --   PHOS 3.2  --   --   --   --   --   --   --    < > = values in this interval not displayed.    Liver Function Tests: Recent Labs  Lab 10/06/20 0456  AST 25  ALT 23  ALKPHOS 46  BILITOT 0.7  PROT 5.0*  ALBUMIN 1.5*   No results for input(s): LIPASE, AMYLASE in the last 168 hours. No results for input(s): AMMONIA in the last 168 hours.  CBC: Recent Labs  Lab 10/08/20 0338 10/09/20 0450 10/10/20 0342 10/11/20 0335 10/12/20 0344  WBC 6.7 8.3 6.8 9.7 11.7*  HGB 10.3* 8.5* 9.1* 8.0* 8.7*  HCT 33.1* 27.2* 29.2* 25.8* 27.8*  MCV 89.0 88.9 88.8 89.6 89.7  PLT 202 233 230 250 323    Cardiac Enzymes: No results for input(s): CKTOTAL, CKMB, CKMBINDEX, TROPONINI in the last 168 hours.  BNP: BNP (last 3 results) Recent Labs    09/23/20 0311  BNP 1,191.8*    ProBNP (last 3 results) No results for input(s): PROBNP in the last 8760 hours.    Other results:  Imaging: DG Chest Port 1 View  Result Date: 10/10/2020 CLINICAL DATA:  Status post tracheostomy placement EXAM: PORTABLE CHEST 1 VIEW COMPARISON:  10/09/2020 FINDINGS: Tracheostomy tube is now seen in satisfactory position. Gastric catheter and feeding catheter are noted. Left-sided PICC line is again seen and stable. Cardiac shunt eyes is stable. Small effusions and basilar atelectasis are seen bilaterally. IMPRESSION: Tracheostomy tube in satisfactory position. Bibasilar atelectasis and effusions. Electronically Signed   By:  Inez Catalina M.D.   On: 10/10/2020 12:23     Medications:     Scheduled Medications: . amiodarone  200 mg Per Tube Daily  . aspirin  324 mg Per Tube Daily  . atorvastatin  80 mg Per Tube Daily  . chlorhexidine gluconate (MEDLINE KIT)  15 mL Mouth Rinse BID  . Chlorhexidine Gluconate Cloth  6 each Topical Daily  . clonazePAM  1 mg Per Tube BID  . feeding supplement (PIVOT 1.5 CAL)  1,000 mL Per Tube Q24H  . feeding supplement (PROSource TF)  45 mL Per Tube BID  . folic acid  1 mg Per Tube Daily  . heparin injection (subcutaneous)  5,000 Units Subcutaneous Q8H  . hydrALAZINE  25 mg Per Tube Q8H  . insulin aspart  0-24 Units Subcutaneous Q4H  . insulin aspart  6 Units Subcutaneous Q4H  . insulin glargine  5 Units Subcutaneous BID  . ipratropium-albuterol  3 mL Nebulization BID  . isosorbide dinitrate  10 mg Per Tube TID  . mouth rinse  15 mL Mouth Rinse 10 times per day  . metoCLOPramide (REGLAN) injection  5 mg Intravenous Q6H  . pantoprazole sodium  40 mg Per Tube Daily  . polyethylene glycol  17 g Per Tube BID  . QUEtiapine  100 mg Per Tube BID  . rOPINIRole  3 mg Per Tube TID  . sodium chloride flush  10-40 mL Intracatheter Q12H  . sodium chloride flush  3 mL Intravenous Q12H  . thiamine  100 mg Per Tube Daily  . valproic acid  250 mg Per Tube BID    Infusions: . dextrose Stopped (10/11/20 0724)  . lactated ringers Stopped (10/01/20 0607)    PRN Medications: acetaminophen (TYLENOL) oral liquid 160 mg/5 mL, albuterol, fentaNYL (SUBLIMAZE) injection, lip balm, metoprolol tartrate, midazolam, ondansetron (ZOFRAN) IV, sodium chloride flush, sodium chloride flush, sorbitol   Assessment/Plan:   1. 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  - Co-ox 71%. Stable off milrinone.   - CVP 7. Creatinine trending  up. No diuretic today.   - No ACE/ARB/ARNI/MRA with AKI. No b-blocker yet with shock.  - c/w hydral/nitrates for afterload reduction   2. NSTEMI with critical CAD - 95% LM and high grade ostial RCA - CABG on 5/6 for critical LM disease (LIMA -> LAD, SVG -> OM, SVG -> RCA) - Continue ASA.  -  Continue statin.  - Add Plavix  3. Acute hypoxic respiratory failure due to pulmonary edema - on vent.  - Possible aspiration 5/8 & 5/10  - 5/6 Blood CX - NG  - Antibiotics completed 5/16 - CCM managing--> S/P trach  - Failed SBT 5/21   4. AKI / Uremia  - due to ATN - Cr peaked 3.5->1.5>1.9 .   5. Ventral Hernia  - 5/10 NG placed to decompress   6. ID - Finished zosyn for possible aspiration 5/16.  - Blood CX 5/6- NGTD, UC 5/13 NGTD  Febrile last night.    7. Anemia - Given 1 unit PRBC 5/19. - Hgb 8.7  , transfuse < 8.   8. Hypernatremia -Resolved.   9. Atrial fibrillation - NSR  - Continue amio back to 200 mg daily.    Length of Stay: [place], NP  7:27 AM   Agree with above.   Awake on vent through trach but not following commands. Agitated. Got one dose of IV lasix yesterday and Scr back up. Co-ox stable. Low-grade fever overnight  General:  Ill appearing. Confused  HEENT: normal + Cor-trak Neck: supple. no JVD. + trach Carotids 2+ bilat; no bruits. No lymphadenopathy or thryomegaly appreciated. Cor: PMI nondisplaced. Irregular rate & rhythm. No rubs, gallops or murmurs. Lungs: clear Abdomen: soft, nontender, nondistended. No hepatosplenomegaly. No bruits or masses. Good bowel sounds. + hernia Extremities: no cyanosis, clubbing, rash, edema Neuro: confused will not follow commands  Remains encepholapthic. Hold sedatives as much as possible.  BP up will increase hydralazine. Hold diuretics today with increasing Scr. Having some wenckebach. Add plavix.    Glori Bickers, MD  11:01 AM

## 2020-10-12 NOTE — Progress Notes (Signed)
NAME:  UNDRAY ALLMAN, MRN:  [id number], DOB:  Apr 30, 1941, LOS: 19 ADMISSION DATE:  09/23/2020, CONSULTATION DATE:  09/24/2019 REFERRING MD:  Jacinto Halim, CHIEF COMPLAINT:  Acute coronary syndrome   History of Present Illness:  80 year old man who presented with cardiogenic shock secondary to NSTEMI.  Presented with new onset RSCP and severe dyspnea. Found to have acute pulmonary edema and heart failure; intubated and sent for urgent LHC which revealed 3V CAD.  IABP inserted and patient underwent urgent CABG. IABP removed 5/9. Continues to require full mechanical ventilation support.  Pertinent  Medical History   Past Medical History:  Diagnosis Date  . Arthritis   . Asthma   . DDD (degenerative disc disease), lumbar   . Hernia of abdominal wall   . RLS (restless legs syndrome)    Significant Hospital Events: Including procedures, antibiotic start and stop dates in addition to other pertinent events    . 5/6 CABG with Dr Cliffton Asters with LIMA to LAD, SVG to RCA d and SVG to ramus. LV normal and returned to ICU with no vasopressor support.  . No post-operative bleeding but poor RV performance on hemodynamics.  . 5/8 significant hemodynamic instability with labile blood pressure and vasopressor requirements yesterday.  Episode of emesis but no evidence of bowel obstruction.  Evidence of pericardial tamponade. . 5/9 improved hemodynamic, able now to tolerate balloon pump weaning and removal. . 5/11 Stable hemodynamics, continuing diuresis. Improved UOP. TPN consult for initiation 5/12 in the setting of ileus, prolonged NPO status.  . 5/13 sedated, diuresis, milrinone, PICC line today, CVL removed  . 5/14 tolerated short SBT/SAT on Precedex . 5/16 Not tolerating SBT due to significant tachypnea to 50s, placed back on PRVC. Marland Kitchen 5/17 Improved SBT toleration, weaning milrinone, minimizing sedation. Bronched w/ c/f mucus plugging, airways clear, ETT exchanged due to significant buildup. Diuresis  held. . 5/18 More agitated overnight requiring increased sedation with addition of Fentanyl/Versed, Seroquel started. BUN continues to rise. Nephro consult.   5/19 Continues to be agitated overnight requiring increased sedation with fentanyl/versed. Started Klonopin, Seroquel and Requip yesterday. Started on valproate and seroquel increased.  5/20 Patient more interactive following commands and maintaining eyes open and tracking.  5/21 failing SBT's.  Plan for trach next week  5/23 Trach (BI/DS)  5/25 TCT, NGT removed, cortrack in place for TF   Interim History / Subjective:   No issues overnight except occasional agitation   Objective   Blood pressure (!) 159/58, pulse 77, temperature 98.6 F (37 C), temperature source Oral, resp. rate (!) 22, height 5\' 6"  (1.676 m), weight 80.4 kg, SpO2 100 %. CVP:  [2 mmHg-13 mmHg] 12 mmHg  Vent Mode: PSV;CPAP FiO2 (%):  [40 %] 40 % Set Rate:  [22 bmp] 22 bmp Vt Set:  [560 mL-650 mL] 560 mL PEEP:  [5 cmH20] 5 cmH20 Pressure Support:  [8 cmH20] 8 cmH20 Plateau Pressure:  [15 cmH20-25 cmH20] 15 cmH20   Intake/Output Summary (Last 24 hours) at 10/12/2020 0803 Last data filed at 10/12/2020 0600 Gross per 24 hour  Intake 1420.26 ml  Output 1900 ml  Net -479.74 ml   Filed Weights   10/09/20 0500 10/10/20 0500 10/11/20 0500  Weight: 81.5 kg 80 kg 80.4 kg   Physical Examination: BP (!) 159/58   Pulse 77   Temp 98.6 F (37 C) (Oral)   Resp (!) 22   Ht 5\' 6"  (1.676 m)   Wt 80.4 kg   SpO2 100%   BMI 28.61  kg/m    Gen:     Elderly male, on vent HEENT: trach in place  Lungs:    BL vented breaths  CV:       RRR, s1 s2  Abd:   Ventral hernia, soft  Ext:  No significant edema  Skin:     No rash  Neuro: not following commands, he is alert, but sluggish   Labs/imaging that I have personally reviewed: (right click and "Reselect all SmartList Selections" daily)   Sodium 143  Potassium 3.9 Serum creatinine 1.98 White blood cell count  11.7 Hemoglobin 8.7  Resolved Hospital Problem List   NSTEMI, Thrombocytopenia  Assessment & Plan:   Acute hypoxemic hypercapnic respiratory failure requiring mechanical ventilation. Prolonged vent weaning due to NM weakness. Possible aspiration Plan: Routine trach care  TCT today if able  Remove NGT   Critically ill due to RV dysfunction following ACS and cardiopulmonary bypass  Cardiogenic shock NSTEMI with culprit RCA involvement Coronary artery disease Plan: Continue amiodarone, lasix Continue hydralazine, isosorbide Holding entresto due to elevated scr   Acute kidney injury Uremia Hypernatremia Plan: Continue to observe UOP and kidney function   Gastric ileus, improved Plan: Continue tube feeds   Anemia (Baseline ~10) Plan: DVT ppx  Follow for any signs of bleeding   Agitated delirium controlled H/o alcohol abuse Plan: Increased seroquel Dropped requip to QHS  Decreased klonipin  Continue valproic acid   RLS Plan: requip QHS   DM Plan: Lantus + SSI   Mobility, Deconditioning  Plan:  PT/OT    Best Practice: (right click and "Reselect all SmartList Selections" daily)  Diet:  Tube Feed   Pain/Anxiety/Delirium protocol (if indicated): Yes (RASS goal -1) VAP protocol (if indicated): Yes DVT prophylaxis: Subcutaneous Heparin GI prophylaxis: PPI Glucose control:  SSI Yes and Basal insulin Yes, Central venous access:  Yes, and it is still needed Arterial line:  N/A Foley:  Yes, and it is still needed Mobility:  bed rest  PT consulted: N/A Last date of multidisciplinary goals of care discussion per primary team  Code Status:  full code Disposition: ICU   Josephine Igo, DO [facility] 10/12/2020 8:03 AM

## 2020-10-12 NOTE — Progress Notes (Signed)
      [address]       [place] [address]             [phone]                 19 Days Post-Op Procedure(s) (LRB): CORONARY ARTERY BYPASS GRAFTING (CABG)  TIMES 4 USING LEFT GREATER SAPHENOUS VEIN HARVESTED ENDOSCOPICALLY AND LEFT INTERNAL MAMMARY ARTERY (N/A) TRANSESOPHAGEAL ECHOCARDIOGRAM (TEE) (N/A)   Events: No events  _______________________________________________________________ Vitals: BP (!) 159/58   Pulse 77   Temp 97.7 F (36.5 C) (Oral)   Resp (!) 22   Ht 5\' 6"  (1.676 m)   Wt 80.4 kg   SpO2 100%   BMI 28.61 kg/m   - Neuro: awake  - Cardiovascular: Normal sinus rhythm.  I  Drips:  none CVP:  [2 mmHg-13 mmHg] 12 mmHg  - Pulm:  Vent Mode: PSV;CPAP FiO2 (%):  [40 %] 40 % Set Rate:  [22 bmp] 22 bmp Vt Set:  [560 mL-650 mL] 560 mL PEEP:  [5 cmH20] 5 cmH20 Pressure Support:  [8 cmH20] 8 cmH20 Plateau Pressure:  [15 cmH20-25 cmH20] 15 cmH20  ABG    Component Value Date/Time   PHART 7.410 09/26/2020 0539   PCO2ART 31.1 (L) 09/26/2020 0539   PO2ART 136 (H) 09/26/2020 0539   HCO3 19.9 (L) 09/26/2020 0539   TCO2 21 (L) 09/26/2020 0539   ACIDBASEDEF 4.0 (H) 09/26/2020 0539   O2SAT 69.5 10/12/2020 0344    - Abd: distended, but soft - Extremity: Warm  .Intake/Output      05/24 0701 05/25 0700 05/25 0701 05/26 0700   P.O. 0    I.V. (mL/kg) 40.2 (0.5)    Other 60    NG/GT 1410    IV Piggyback     Total Intake(mL/kg) 1510.2 (18.8)    Urine (mL/kg/hr) 1875 (1)    Emesis/NG output 350    Stool     Total Output 2225    Net -714.8            _______________________________________________________________ Labs: CBC Latest Ref Rng & Units 10/12/2020 10/11/2020 10/10/2020  WBC 4.0 - 10.5 K/uL 11.7(H) 9.7 6.8  Hemoglobin 13.0 - 17.0 g/dL 10/12/2020) 7.6(B) 3.4(L)  Hematocrit 39.0 - 52.0 % 27.8(L) 25.8(L) 29.2(L)  Platelets 150 - 400 K/uL 323 250 230   CMP Latest Ref Rng & Units 10/12/2020 10/11/2020 10/10/2020  Glucose 70 - 99 mg/dL  10/12/2020) 902(I) 097(D)  BUN 8 - 23 mg/dL 532(D) 92(E) 26(S)  Creatinine 0.61 - 1.24 mg/dL 34(H) 9.62(I) 2.97(L)  Sodium 135 - 145 mmol/L 143 137 138  Potassium 3.5 - 5.1 mmol/L 3.9 4.0 3.6  Chloride 98 - 111 mmol/L 107 105 102  CO2 22 - 32 mmol/L 26 25 27   Calcium 8.9 - 10.3 mg/dL 8.92(J) 7.8(L) 7.7(L)  Total Protein 6.5 - 8.1 g/dL - - -  Total Bilirubin 0.3 - 1.2 mg/dL - - -  Alkaline Phos 38 - 126 U/L - - -  AST 15 - 41 U/L - - -  ALT 0 - 44 U/L - - -    CXR: -  _______________________________________________________________  Assessment and Plan: POD 19 s/p emergency CABG.  Neuro: agitation CV:  Off milr.   Pulm: wean vent as tolerated Renal: making good urine.  Creat trending down. GI: on goal tube feeds. Heme: Stable .   Endo: Sliding scale insulin. Dispo: Continue ICU care.   1.9(E 10/12/2020 7:41 AM

## 2020-10-12 NOTE — TOC Initial Note (Addendum)
Transition of Care [facility]) - Initial/Assessment Note    Patient Details  Name: Jack Vance MRN: [id number] Date of Birth: [birth year]  [facility] CM/SW Contact:    Jack Lewandowsky, RN Phone Number: 10/12/2020, 2:53 PM  Clinical Narrative: Risk for readmission assessment completed. Case Manager received a consult regarding LTAC placement. Case Manager spoke with Dr. Tonia Vance in rounds regarding LTAC and he is agreeable for Case Manager to contact family with LTAC offer. Case Manager reached out to the patient's sister Jack Vance and we discussed LTAC and what they offer. [facility] offered [facility]. Sister is agreeable to for the Case Manager to call [facility] to see if they can start insurance authorization. Awaiting to hear back from [facility] regarding insurance authorization.                 Expected Discharge Plan: Home w Home Health Services Barriers to Discharge: No Barriers Identified  Patient Goals and CMS Choice     Choice offered to / list presented to : Sibling  Expected Discharge Plan and Services Expected Discharge Plan: Home w Home Health Services In-house Referral: Clinical Social Work Discharge Planning Services: CM Consult Post Acute Care Choice: Long Term Acute Care (LTAC) Living arrangements for the past 2 months: Apartment                 Prior Living Arrangements/Services Living arrangements for the past 2 months: Apartment Lives with:: Self,Siblings     Need for Family Participation in Patient Care: Yes (Comment) Care giver support system in place?: Yes (comment)   Criminal Activity/Legal Involvement Pertinent to Current Situation/Hospitalization: No - Comment as needed  Activities of Daily Living Home Assistive Devices/Equipment: Eyeglasses,Cane (specify quad or straight),Hearing aid,CPAP (single point cane, bilateral hearing aids) ADL Screening (condition at time of admission) Patient's cognitive ability adequate to safely  complete daily activities?: Yes Is the patient deaf or have difficulty hearing?: Yes (wears bilateral hearng iads) Does the patient have difficulty seeing, even when wearing glasses/contacts?: No Does the patient have difficulty concentrating, remembering, or making decisions?: No Patient able to express need for assistance with ADLs?: Yes Does the patient have difficulty dressing or bathing?: No Independently performs ADLs?: Yes (appropriate for developmental age) Does the patient have difficulty walking or climbing stairs?: Yes (secondary to shortness of breath) Weakness of Legs: Both Weakness of Arms/Hands: None  Permission Sought/Granted Permission sought to share information with : Facility Scientist, research (physical sciences) granted to share information with : Yes, Verbal Permission Granted     Permission granted to share info w AGENCY: [facility] Emotional Assessment    Alcohol / Substance Use: Not Applicable Psych Involvement: No (comment)  Admission diagnosis:  Respiratory distress [R06.03] Abnormal EKG [R94.31] NSTEMI (non-ST elevated myocardial infarction) (HCC) [I21.4] Acute myocardial infarction (HCC) [I21.9] S/P CABG x 3 [Z95.1] Patient Active Problem List   Diagnosis Date Noted  . Pressure injury of skin 10/05/2020  . Acute myocardial infarction (HCC) 09/23/2020  . Acute heart failure (HCC) 09/23/2020  . Morbid obesity (HCC) 09/23/2020  . Hypertension 09/23/2020  . NSTEMI (non-ST elevated myocardial infarction) (HCC) 09/23/2020  . S/P CABG x 3 09/23/2020  . Insomnia 05/04/2020  . Ruptured appendicitis 12/04/2017  . Restless leg syndrome 12/04/2017  . Normocytic normochromic anemia 12/04/2017  . Asthma 12/04/2017  . HLD (hyperlipidemia) 12/04/2017  . Abdominal wall abscess 12/04/2017  . Abdominal visceral abscess (HCC) 12/03/2017   PCP:  [facility], [facility]acy:  [facility] [address], [place] - [address] [place] [address] Phone: [phone] Fax: [phone]  Readmission Risk Interventions No flowsheet data found.

## 2020-10-13 DIAGNOSIS — J9611 Chronic respiratory failure with hypoxia: Secondary | ICD-10-CM | POA: Diagnosis not present

## 2020-10-13 DIAGNOSIS — Z9911 Dependence on respirator [ventilator] status: Secondary | ICD-10-CM | POA: Diagnosis not present

## 2020-10-13 DIAGNOSIS — I5021 Acute systolic (congestive) heart failure: Secondary | ICD-10-CM | POA: Diagnosis not present

## 2020-10-13 DIAGNOSIS — N179 Acute kidney failure, unspecified: Secondary | ICD-10-CM | POA: Diagnosis not present

## 2020-10-13 DIAGNOSIS — I214 Non-ST elevation (NSTEMI) myocardial infarction: Secondary | ICD-10-CM | POA: Diagnosis not present

## 2020-10-13 LAB — COOXEMETRY PANEL
Carboxyhemoglobin: 0.8 % (ref 0.5–1.5)
Methemoglobin: 0.9 % (ref 0.0–1.5)
O2 Saturation: 71.1 %
Total hemoglobin: 12.7 g/dL (ref 12.0–16.0)

## 2020-10-13 LAB — GLUCOSE, CAPILLARY
Glucose-Capillary: 115 mg/dL — ABNORMAL HIGH (ref 70–99)
Glucose-Capillary: 115 mg/dL — ABNORMAL HIGH (ref 70–99)
Glucose-Capillary: 147 mg/dL — ABNORMAL HIGH (ref 70–99)
Glucose-Capillary: 148 mg/dL — ABNORMAL HIGH (ref 70–99)
Glucose-Capillary: 160 mg/dL — ABNORMAL HIGH (ref 70–99)
Glucose-Capillary: 95 mg/dL (ref 70–99)

## 2020-10-13 LAB — BASIC METABOLIC PANEL
Anion gap: 10 (ref 5–15)
BUN: 101 mg/dL — ABNORMAL HIGH (ref 8–23)
CO2: 27 mmol/L (ref 22–32)
Calcium: 8.5 mg/dL — ABNORMAL LOW (ref 8.9–10.3)
Chloride: 109 mmol/L (ref 98–111)
Creatinine, Ser: 2.02 mg/dL — ABNORMAL HIGH (ref 0.61–1.24)
GFR, Estimated: 33 mL/min — ABNORMAL LOW (ref >60)
Glucose, Bld: 155 mg/dL — ABNORMAL HIGH (ref 70–99)
Potassium: 3.8 mmol/L (ref 3.5–5.1)
Sodium: 146 mmol/L — ABNORMAL HIGH (ref 135–145)

## 2020-10-13 LAB — CBC
HCT: 26.7 % — ABNORMAL LOW (ref 39.0–52.0)
Hemoglobin: 8.2 g/dL — ABNORMAL LOW (ref 13.0–17.0)
MCH: 27.8 pg (ref 26.0–34.0)
MCHC: 30.7 g/dL (ref 30.0–36.0)
MCV: 90.5 fL (ref 80.0–100.0)
Platelets: 325 10*3/uL (ref 150–400)
RBC: 2.95 MIL/uL — ABNORMAL LOW (ref 4.22–5.81)
RDW: 18.4 % — ABNORMAL HIGH (ref 11.5–15.5)
WBC: 10.7 10*3/uL — ABNORMAL HIGH (ref 4.0–10.5)
nRBC: 0 % (ref 0.0–0.2)

## 2020-10-13 MED ORDER — ALTEPLASE 2 MG IJ SOLR
2.0000 mg | Freq: Once | INTRAMUSCULAR | Status: AC
  Administered 2020-10-13: 2 mg
  Filled 2020-10-13: qty 2

## 2020-10-13 MED ORDER — POTASSIUM CHLORIDE 20 MEQ PO PACK
20.0000 meq | PACK | Freq: Once | ORAL | Status: AC
  Administered 2020-10-13: 20 meq
  Filled 2020-10-13: qty 1

## 2020-10-13 MED ORDER — GERHARDT'S BUTT CREAM
TOPICAL_CREAM | CUTANEOUS | Status: DC | PRN
  Filled 2020-10-13: qty 1

## 2020-10-13 NOTE — Progress Notes (Signed)
Patient ID: Jack Vance, male   DOB: [birth year], 80 y.o.   MRN: [id number]  TCTS Evening Rounds:   Hemodynamically stable   No progress on weaning vent today  Urine output good     CBC    Component Value Date/Time   WBC 10.7 (H) 10/13/2020 0316   RBC 2.95 (L) 10/13/2020 0316   HGB 8.2 (L) 10/13/2020 0316   HCT 26.7 (L) 10/13/2020 0316   PLT 325 10/13/2020 0316   MCV 90.5 10/13/2020 0316   MCH 27.8 10/13/2020 0316   MCHC 30.7 10/13/2020 0316   RDW 18.4 (H) 10/13/2020 0316   LYMPHSABS 1.2 10/03/2020 0424   MONOABS 1.1 (H) 10/03/2020 0424   EOSABS 0.2 10/03/2020 0424   BASOSABS 0.1 10/03/2020 0424     BMET    Component Value Date/Time   NA 146 (H) 10/13/2020 0316   K 3.8 10/13/2020 0316   CL 109 10/13/2020 0316   CO2 27 10/13/2020 0316   GLUCOSE 155 (H) 10/13/2020 0316   BUN 101 (H) 10/13/2020 0316   CREATININE 2.02 (H) 10/13/2020 0316   CALCIUM 8.5 (L) 10/13/2020 0316   GFRNONAA 33 (L) 10/13/2020 0316   GFRAA >60 12/08/2017 0510     A/P:  Stable day.  Continue current plans

## 2020-10-13 NOTE — Progress Notes (Signed)
PMV in-line trial done with Speech therapy.  PEEP dropped to zero and cuff deflated, pressures adjusted to match PIP of 23.  Tolerated well.  Sat stayed 98-100% during trial.  Patient hears easier out of his right ear than left when asking him questions.  He was able to wear it for 12 minutes.  Speaking was sporadic and most of the time wasn't audible.  As patient became tired we switched to full support to rest.

## 2020-10-13 NOTE — Progress Notes (Addendum)
Patient ID: Jack Vance, male   DOB: 1940/10/16, 80 y.o.   MRN: [id number]    Advanced Heart Failure Rounding Note   Subjective:    Underwent emergent CABG on 5/6 for critical LM disease (LIMA -> LAD, SVG -> OM, SVG -> RCA) 5/6 Cultures negative.  5/8 Possible aspiration. A fib --> started amio drip.  Given 2u PRBCs. Hypotensive in the evening and started on vasopressin + antibiotics.  5/9 IABP removed 5/10 NG placed >1.5 out  5/11 started neostigmime 5/12 Febrile 102 5/17 Failed SBT. Underwent bronchoscopy for attempted therapeutic aspiration for mucus plugging. ETT exchanged.  5/19 Hgb 6.1 given 1UPRBC 5/21  Failed SBT  5/23 Trach.   Diuretics held yesterday w/ bump in SCr, still trending up slightly, 1.55>>1.98>>2.02. BUN 101. Encephalopathic. Wt stable off diuretics   Co-ox 71%   BP better today w/ hydralazine increase.   AF overnight. WBC 12>>11.   Objective:   Weight Range:  Vital Signs:   Temp:  [97.5 F (36.4 C)-98.7 F (37.1 C)] 98.3 F (36.8 C) (05/26 0428) Pulse Rate:  [84-98] 85 (05/26 0722) Resp:  [18-37] 33 (05/26 0722) BP: (109-168)/(54-78) 139/71 (05/26 0722) SpO2:  [100 %] 100 % (05/26 0722) FiO2 (%):  [40 %] 40 % (05/26 0722) Weight:  [80 kg] 80 kg (05/26 0339) Last BM Date: 10/12/20  Weight change: Filed Weights   10/10/20 0500 10/11/20 0500 10/13/20 0339  Weight: 80 kg 80.4 kg 80 kg    Intake/Output:   Intake/Output Summary (Last 24 hours) at 10/13/2020 0751 Last data filed at 10/13/2020 0600 Gross per 24 hour  Intake 840 ml  Output 2000 ml  Net -1160 ml    PHYSICAL EXAM: General:  Chronically ill appearing, Awake on vent via trach, encephalopathic  HEENT: + TC, + cortrak  Neck: trach, supple. no JVD. Carotids 2+ bilat; no bruits. No lymphadenopathy or thryomegaly appreciated. Cor: PMI nondisplaced. Regular rate & rhythm. No rubs, gallops or murmurs. Lungs: course bilaterally  Abdomen: soft, nontender,  + large ventral hernia,   nondistended. No hepatosplenomegaly. No bruits or masses. Good bowel sounds. Extremities: no cyanosis, clubbing, rash, edema + TED hoses  Neuro: Awake on vent. Encephalopathic    Telemetry: NSR with occasional PVCs. 90s   Labs: Basic Metabolic Panel: Recent Labs  Lab 10/07/20 0327 10/07/20 1428 10/09/20 0450 10/10/20 0342 10/11/20 0335 10/12/20 0344 10/13/20 0316  NA 147*   < > 138 138 137 143 146*  K 4.3   < > 4.0 3.6 4.0 3.9 3.8  CL 118*   < > 105 102 105 107 109  CO2 22   < > _0 GLUCOSE 159*   < > 158* 139* 231* 145* 155*  BUN 116*   < > 92* 93* 83* 93* 101*  CREATININE 2.36*   < > 1.86* 1.92* 1.55* 1.98* 2.02*  CALCIUM 7.7*   < > 7.6* 7.7* 7.8* 8.5* 8.5*  MG 2.5*  --   --   --   --   --   --    < > = values in this interval not displayed.    Liver Function Tests: No results for input(s): AST, ALT, ALKPHOS, BILITOT, PROT, ALBUMIN in the last 168 hours. No results for input(s): LIPASE, AMYLASE in the last 168 hours. No results for input(s): AMMONIA in the last 168 hours.  CBC: Recent Labs  Lab 10/09/20 0450 10/10/20 0342 10/11/20 0335 10/12/20 0344 10/13/20 0316  WBC 8.3 6.8  9.7 11.7* 10.7*  HGB 8.5* 9.1* 8.0* 8.7* 8.2*  HCT 27.2* 29.2* 25.8* 27.8* 26.7*  MCV 88.9 88.8 89.6 89.7 90.5  PLT 233 230 250 323 325    Cardiac Enzymes: No results for input(s): CKTOTAL, CKMB, CKMBINDEX, TROPONINI in the last 168 hours.  BNP: BNP (last 3 results) Recent Labs    09/23/20 0311  BNP 1,191.8*    ProBNP (last 3 results) No results for input(s): PROBNP in the last 8760 hours.    Other results:  Imaging: No results found.   Medications:     Scheduled Medications: . amiodarone  200 mg Per Tube Daily  . aspirin  324 mg Per Tube Daily  . atorvastatin  80 mg Per Tube Daily  . chlorhexidine gluconate (MEDLINE KIT)  15 mL Mouth Rinse BID  . Chlorhexidine Gluconate Cloth  6 each Topical Daily  . clonazePAM  0.5 mg Per Tube BID  . clopidogrel  75  mg Per Tube Daily  . feeding supplement (PIVOT 1.5 CAL)  1,000 mL Per Tube Q24H  . feeding supplement (PROSource TF)  45 mL Per Tube BID  . folic acid  1 mg Per Tube Daily  . heparin injection (subcutaneous)  5,000 Units Subcutaneous Q8H  . hydrALAZINE  37.5 mg Per Tube Q8H  . insulin aspart  0-24 Units Subcutaneous Q4H  . insulin aspart  6 Units Subcutaneous Q4H  . insulin glargine  5 Units Subcutaneous BID  . ipratropium-albuterol  3 mL Nebulization BID  . isosorbide dinitrate  10 mg Per Tube TID  . mouth rinse  15 mL Mouth Rinse 10 times per day  . pantoprazole sodium  40 mg Per Tube Daily  . polyethylene glycol  17 g Per Tube Daily  . QUEtiapine  150 mg Per Tube BID  . rOPINIRole  0.5 mg Per Tube QHS  . sodium chloride flush  10-40 mL Intracatheter Q12H  . sodium chloride flush  3 mL Intravenous Q12H  . thiamine  100 mg Per Tube Daily  . valproic acid  250 mg Per Tube BID    Infusions: . dextrose Stopped (10/11/20 0724)  . lactated ringers Stopped (10/01/20 0607)    PRN Medications: acetaminophen (TYLENOL) oral liquid 160 mg/5 mL, albuterol, fentaNYL (SUBLIMAZE) injection, lip balm, metoprolol tartrate, ondansetron (ZOFRAN) IV, oxyCODONE, sodium chloride flush, sodium chloride flush, sorbitol   Assessment/Plan:   1. 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  - Co-ox 71%. Stable off milrinone.   - Holding diuretics w/ AKI. Wt stable  - No ACE/ARB/ARNI/MRA with AKI. No b-blocker yet with shock.  - c/w hydral/nitrates for afterload reduction   2. NSTEMI with critical CAD - 95% LM and high grade ostial RCA - CABG on 5/6 for critical LM disease (LIMA -> LAD, SVG -> OM, SVG -> RCA) - Continue ASA + Plavix   - Continue statin.   3. Acute hypoxic respiratory failure due to pulmonary edema - on vent.  -  Possible aspiration 5/8 & 5/10  - 5/6 Blood CX - NG  - Antibiotics completed 5/16 - CCM managing--> S/P trach  - Failed SBT 5/21   4. AKI / Uremia  - due to ATN - Cr peaked 3.5->1.5>1.9>2.0 .   5. Ventral Hernia  - 5/10 NG placed to decompress   6. ID - Finished zosyn  for possible aspiration 5/16.  - Blood CX 5/6- NGTD, UC 5/13 NGTD  - Afebrile   7. Anemia - Given 1 unit PRBC 5/19. - Hgb 8.2  , transfuse < 8.   8. Hypernatremia  - Na 146 today, monitor   9. Atrial fibrillation - NSR  - Continue amio 200 mg daily.    Length of Stay: [place], PA-C  7:51 AM   Patient seen and examined with the above-signed Advanced Practice Provider and/or Housestaff. I personally reviewed laboratory data, imaging studies and relevant notes. I independently examined the patient and formulated the important aspects of the plan. I have edited the note to reflect any of my changes or salient points. I have personally discussed the plan with the patient and/or family.  Remains encephalopathic. Unable to wean from vent (on trach). SCr up again slightly. BUN 101 Remains in NSR.  General:  Weak appearing. Confused No resp difficulty HEENT: normal + cor-trak  Neck: supple. no JVD. + trach Carotids 2+ bilat; no bruits. No lymphadenopathy or thryomegaly appreciated. Cor: PMI nondisplaced. Regular rate & rhythm. No rubs, gallops or murmurs. Lungs: clear Abdomen: soft, nontender, nondistended. No hepatosplenomegaly. No bruits or masses. Good bowel sounds. Large ventral hernia Extremities: no cyanosis, clubbing, rash, edema Neuro:  Awake but wont follow commands  Remains encephalopathic.Unable to wean from vent via trach. Unclear if there is a component of uremia here. Continue to hold diuretics. BP improved with increased dose hydralazine. Will likely need LTAC. Will discuss possible [facility] referral.   Glori Bickers, MD  10:14 PM

## 2020-10-13 NOTE — Evaluation (Signed)
[facility]y-[facility] Speaking Valve - Evaluation Patient Details  Name: Jack Vance MRN: [id number] Date of Birth: 11-Mar-1941  Today's Date: 10/13/2020 Time: 6301-6010 SLP Time Calculation (min) (ACUTE ONLY): 24 min  Past Medical History:  Past Medical History:  Diagnosis Date  . Arthritis   . Asthma   . DDD (degenerative disc disease), lumbar   . Hernia of abdominal wall   . RLS (restless legs syndrome)    Past Surgical History:  Past Surgical History:  Procedure Laterality Date  . CORONARY ARTERY BYPASS GRAFT N/A 09/23/2020   Procedure: CORONARY ARTERY BYPASS GRAFTING (CABG)  TIMES 4 USING LEFT GREATER SAPHENOUS VEIN HARVESTED ENDOSCOPICALLY AND LEFT INTERNAL MAMMARY ARTERY;  Surgeon: Corliss Skains, MD;  Location: [facility] OR;  Service: Open Heart Surgery;  Laterality: N/A;  . HERNIA REPAIR    . IABP INSERTION N/A 09/23/2020   Procedure: IABP Insertion;  Surgeon: Yates Decamp, MD;  Location: [facility] INVASIVE CV LAB;  Service: Cardiovascular;  Laterality: N/A;  . RIGHT/LEFT HEART CATH AND CORONARY ANGIOGRAPHY N/A 09/23/2020   Procedure: RIGHT/LEFT HEART CATH AND CORONARY ANGIOGRAPHY;  Surgeon: Yates Decamp, MD;  Location: [facility] INVASIVE CV LAB;  Service: Cardiovascular;  Laterality: N/A;  . SHOULDER SURGERY    . TEE WITHOUT CARDIOVERSION N/A 09/23/2020   Procedure: TRANSESOPHAGEAL ECHOCARDIOGRAM (TEE);  Surgeon: Corliss Skains, MD;  Location: [facility] OR;  Service: Open Heart Surgery;  Laterality: N/A;   HPI:  Pt is a 80 yo male presenting with cardiogenic shock secondary to NSTEMI with acute pulmonary edema and heart failure s/p urgent LHC. 5/6 IABP and CABG with IABP out 5/9. Pt remained intubated post op. Trach 5/23. PMhx: arthritis, asthma, DDD, RLS   Assessment / Plan / Recommendation Clinical Impression  Pt was seen with RT for PMV trials inline wtih the ventilator. RT provided inline suction prior to cuff deflation with additional secretions also removed orally with his yankauer. Pt was initially on  PS with PEEP changed from 5 to 0 cm H2O and pressure increased to maintain PIP. He tolerated placement of the valve for 12 minutes, although was swatched to [facility] when he started to fatigue. He looked more comfortable with easier WOB once switched to full support. While PMV was donned his VS remained stable and he produced some very soft but audible vocalizations, but any attempts at verbalizations were unintelligible. Pt did seem to respond best when approached from his R ear. Suspect that mentation may be playing a role in communication as well as fatigue, as he had just finished working with PT as well. Will continue trials with SLP/RT only at this time. SLP Visit Diagnosis: Aphonia (R49.1)    SLP Assessment  Patient needs continued Speech Lanaguage Pathology Services    Follow Up Recommendations  LTACH    Frequency and Duration min 2x/week  2 weeks    PMSV Trial PMSV was placed for: 12 min Able to redirect subglottic air through upper airway: Yes Able to Attain Phonation: Yes Voice Quality: Low vocal intensity Able to Expectorate Secretions: No attempts Level of Secretion Expectoration with PMSV: Not observed Breath Support for Phonation: Inadequate Intelligibility: Intelligibility reduced Word: 0-24% accurate Respirations During Trial:  (20s) SpO2 During Trial: 100 % Pulse During Trial: 92 Behavior: Cooperative   Tracheostomy Tube       Vent Dependency  Vent Mode: PRVC Set Rate: 22 bmp PEEP: 5 cmH20 Pressure Support: 16 cmH20 FiO2 (%): 40 % Vt Set: 560 mL    Cuff Deflation Trial  GO  Tolerated Cuff Deflation: Yes Length of Time for Cuff Deflation Trial: 12+ min Behavior: Cooperative         Mahala Menghini., M.A. CCC-SLP Acute Rehabilitation Services Pager [phone] Office [phone]  10/13/2020, 11:05 AM

## 2020-10-13 NOTE — Progress Notes (Signed)
[address]       [place] [address]             [phone]                 20 Days Post-Op Procedure(s) (LRB): CORONARY ARTERY BYPASS GRAFTING (CABG)  TIMES 4 USING LEFT GREATER SAPHENOUS VEIN HARVESTED ENDOSCOPICALLY AND LEFT INTERNAL MAMMARY ARTERY (N/A) TRANSESOPHAGEAL ECHOCARDIOGRAM (TEE) (N/A)   Events: No events  _______________________________________________________________ Vitals: BP 137/64   Pulse 83   Temp (!) 97.5 F (36.4 C) (Oral)   Resp (!) 24   Ht 5\' 6"  (1.676 m)   Wt 80 kg   SpO2 99%   BMI 28.47 kg/m   - Neuro: awake  - Cardiovascular: Normal sinus rhythm.  I  Drips:  none CVP:  [9 mmHg] 9 mmHg  - Pulm:  Vent Mode: PRVC FiO2 (%):  [40 %] 40 % Set Rate:  [22 bmp] 22 bmp Vt Set:  [560 mL] 560 mL PEEP:  [5 cmH20] 5 cmH20 Pressure Support:  [16 cmH20] 16 cmH20 Plateau Pressure:  [17 cmH20-30 cmH20] 18 cmH20  ABG    Component Value Date/Time   PHART 7.410 09/26/2020 0539   PCO2ART 31.1 (L) 09/26/2020 0539   PO2ART 136 (H) 09/26/2020 0539   HCO3 19.9 (L) 09/26/2020 0539   TCO2 21 (L) 09/26/2020 0539   ACIDBASEDEF 4.0 (H) 09/26/2020 0539   O2SAT 71.1 10/13/2020 0317    - Abd: distended, but soft - Extremity: Warm  .Intake/Output      05/25 0701 05/26 0700 05/26 0701 05/27 0700   P.O.     I.V. (mL/kg)     Other 90    NG/GT 750 1200   Total Intake(mL/kg) 840 (10.5) 1200 (15)   Urine (mL/kg/hr) 1900 (1)    Emesis/NG output     Stool 100    Total Output 2000    Net -1160 +1200           _______________________________________________________________ Labs: CBC Latest Ref Rng & Units 10/13/2020 10/12/2020 10/11/2020  WBC 4.0 - 10.5 K/uL 10.7(H) 11.7(H) 9.7  Hemoglobin 13.0 - 17.0 g/dL 8.2(L) 8.7(L) 8.0(L)  Hematocrit 39.0 - 52.0 % 26.7(L) 27.8(L) 25.8(L)  Platelets 150 - 400 K/uL 325 323 250   CMP Latest Ref Rng & Units 10/13/2020 10/12/2020 10/11/2020  Glucose 70 - 99 mg/dL 10/13/2020) 546(F) 681(E)  BUN 8 - 23  mg/dL 751(Z) 001(V) 49(S)  Creatinine 0.61 - 1.24 mg/dL 49(Q) 7.59(F) 6.38(G)  Sodium 135 - 145 mmol/L 146(H) 143 137  Potassium 3.5 - 5.1 mmol/L 3.8 3.9 4.0  Chloride 98 - 111 mmol/L 109 107 105  CO2 22 - 32 mmol/L 27 26 25   Calcium 8.9 - 10.3 mg/dL 6.65(L) ) 7.8(L)  Total Protein 6.5 - 8.1 g/dL - - -  Total Bilirubin 0.3 - 1.2 mg/dL - - -  Alkaline Phos 38 - 126 U/L - - -  AST 15 - 41 U/L - - -  ALT 0 - 44 U/L - - -    CXR: -  _______________________________________________________________  Assessment and Plan: POD 20 s/p emergency CABG.  Neuro: agitation improving CV:  Off milr.   Pulm: wean vent as tolerated Renal: making good urine.  Creat trending down. GI: on goal tube feeds. Heme: Stable 9.3(T.   Endo: Sliding scale insulin. Dispo: Continue ICU care.  Will plan on transfer to LTAC.  Will be unable to place a PEG to ventral hernia  Corliss Skains 10/13/2020 5:11 PM

## 2020-10-13 NOTE — Progress Notes (Signed)
Physical Therapy Treatment Patient Details Name: Jack Vance MRN: [id number] DOB: Sep 18, 1940 Today's Date: 10/13/2020    History of Present Illness 80 yo admitted 5/6 with cardiogenic shock secondary to NSTEMI with acute pulmonary edema and heart failure s/p urgent LHC. 5/6 IABP and CABG with IABP out 5/9. Pt remained intubated post op. Trach 5/23. PMhx: arthritis, asthma, DDD, RLS    PT Comments    Pt tolerates treatment well with progression of bed mobility and initiation of transfer training. Pt remains generally weak and continues to require significant physical assistance for all mobility activities, however he is able to maintain sitting balance with less assistance, and tolerates increased duration of activity this session. Pt will continue to benefit from acute PT POC to improve endurance, balance, and functional mobility quality, while reducing falls risk and caregiver burden.   Follow Up Recommendations  LTACH;Supervision/Assistance - 24 hour     Equipment Recommendations  Hospital bed    Recommendations for Other Services       Precautions / Restrictions Precautions Precautions: Fall;Sternal;Other (comment) Precaution Booklet Issued: No Precaution Comments: NG tube/suction, trach, vent, cortrak Restrictions Weight Bearing Restrictions: Yes Other Position/Activity Restrictions: sternal precautions    Mobility  Bed Mobility Overal bed mobility: Needs Assistance Bed Mobility: Supine to Sit     Supine to sit: Max assist;HOB elevated          Transfers Overall transfer level: Needs assistance Equipment used: 1 person hand held assist Transfers: Sit to/from [employer] Sit to Stand: Max assist;+2 safety/equipment Stand pivot transfers: Max assist;+2 safety/equipment       General transfer comment: PT provides knee block and BUE support, physical assistance to pivot as pt stopping midway through transfer, assistance for line  management  Ambulation/Gait                 Stairs             Wheelchair Mobility    Modified Rankin (Stroke Patients Only)       Balance Overall balance assessment: Needs assistance Sitting-balance support: Single extremity supported;Bilateral upper extremity supported;Feet supported Sitting balance-Leahy Scale: Poor Sitting balance - Comments: minG-minA with BUE support of bed   Standing balance support: Bilateral upper extremity supported Standing balance-Leahy Scale: Poor Standing balance comment: maxA                            Cognition Arousal/Alertness: Awake/alert Behavior During Therapy: Flat affect Overall Cognitive Status: Difficult to assess                                 General Comments: trach to vent, pt inconsistently responds to yes/no questions, increased time to follow commands      Exercises      General Comments General comments (skin integrity, edema, etc.): pt on 40% FiO2 5 PEEP, HR, SpO2, RR stable during session HR in 80s, SpO2 100% and RR from 20-35. BP also stable.      Pertinent Vitals/Pain Pain Assessment: Faces Faces Pain Scale: Hurts little more Pain Location: generalized grimacing at times during session, pt does not indicate a specific area of pain when asked Pain Descriptors / Indicators: Grimacing Pain Intervention(s): Monitored during session    Home Living                      Prior Function  PT Goals (current goals can now be found in the care plan section) Acute Rehab PT Goals Patient Stated Goal: none stated Progress towards PT goals: Progressing toward goals    Frequency    Min 3X/week      PT Plan Current plan remains appropriate    Co-evaluation              AM-PAC PT "6 Clicks" Mobility   Outcome Measure  Help needed turning from your back to your side while in a flat bed without using bedrails?: A Lot Help needed moving from lying on  your back to sitting on the side of a flat bed without using bedrails?: A Lot Help needed moving to and from a bed to a chair (including a wheelchair)?: Total Help needed standing up from a chair using your arms (e.g., wheelchair or bedside chair)?: Total Help needed to walk in hospital room?: Total Help needed climbing 3-5 steps with a railing? : Total 6 Click Score: 8    End of Session Equipment Utilized During Treatment: Oxygen Activity Tolerance: Patient tolerated treatment well Patient left: in chair;with call bell/phone within reach;with chair alarm set;Other (comment) (mits) Nurse Communication: Mobility status;Need for lift equipment PT Visit Diagnosis: Other abnormalities of gait and mobility (R26.89);Muscle weakness (generalized) (M62.81)     Time: 7062-3762 PT Time Calculation (min) (ACUTE ONLY): 33 min  Charges:  $Therapeutic Activity: 23-37 mins                     Arlyss Gandy, PT, DPT Acute Rehabilitation Pager: [phone]    Arlyss Gandy 10/13/2020, 10:03 AM

## 2020-10-13 NOTE — Progress Notes (Signed)
NAME:  Jack Vance, MRN:  [id number], DOB:  May 24, 1940, LOS: 20 ADMISSION DATE:  09/23/2020, CONSULTATION DATE:  09/24/2019 REFERRING MD:  Jacinto Halim, CHIEF COMPLAINT:  Acute coronary syndrome   History of Present Illness:  80 year old man who presented with cardiogenic shock secondary to NSTEMI.  Presented with new onset RSCP and severe dyspnea. Found to have acute pulmonary edema and heart failure; intubated and sent for urgent LHC which revealed 3V CAD.  IABP inserted and patient underwent urgent CABG. IABP removed 5/9. Continues to require full mechanical ventilation support.  Pertinent  Medical History   Past Medical History:  Diagnosis Date  . Arthritis   . Asthma   . DDD (degenerative disc disease), lumbar   . Hernia of abdominal wall   . RLS (restless legs syndrome)    Significant Hospital Events: Including procedures, antibiotic start and stop dates in addition to other pertinent events    . 5/6 CABG with Dr Cliffton Asters with LIMA to LAD, SVG to RCA d and SVG to ramus. LV normal and returned to ICU with no vasopressor support.  . No post-operative bleeding but poor RV performance on hemodynamics.  . 5/8 significant hemodynamic instability with labile blood pressure and vasopressor requirements yesterday.  Episode of emesis but no evidence of bowel obstruction.  Evidence of pericardial tamponade. . 5/9 improved hemodynamic, able now to tolerate balloon pump weaning and removal. . 5/11 Stable hemodynamics, continuing diuresis. Improved UOP. TPN consult for initiation 5/12 in the setting of ileus, prolonged NPO status.  . 5/13 sedated, diuresis, milrinone, PICC line today, CVL removed  . 5/14 tolerated short SBT/SAT on Precedex . 5/16 Not tolerating SBT due to significant tachypnea to 50s, placed back on PRVC. Marland Kitchen 5/17 Improved SBT toleration, weaning milrinone, minimizing sedation. Bronched w/ c/f mucus plugging, airways clear, ETT exchanged due to significant buildup. Diuresis  held. . 5/18 More agitated overnight requiring increased sedation with addition of Fentanyl/Versed, Seroquel started. BUN continues to rise. Nephro consult.   5/19 Continues to be agitated overnight requiring increased sedation with fentanyl/versed. Started Klonopin, Seroquel and Requip yesterday. Started on valproate and seroquel increased.  5/20 Patient more interactive following commands and maintaining eyes open and tracking.  5/21 failing SBT's.  Plan for trach next week  5/23 Trach (BI/DS)  5/25 NGT removed, cortrack in place for TF   5/26 pressure support trial  Interim History / Subjective:   Failed CPAP/PS trial yesterday.  Had to be placed back in a rate yesterday.  Tolerating SBT this morning however.  Objective   Blood pressure (!) 104/49, pulse 90, temperature 98.5 F (36.9 C), temperature source Oral, resp. rate (!) 24, height 5\' 6"  (1.676 m), weight 80 kg, SpO2 100 %. CVP:  [9 mmHg] 9 mmHg  Vent Mode: PSV;CPAP FiO2 (%):  [40 %] 40 % Set Rate:  [22 bmp] 22 bmp Vt Set:  [560 mL] 560 mL PEEP:  [5 cmH20] 5 cmH20 Pressure Support:  [5 cmH20-16 cmH20] 16 cmH20 Plateau Pressure:  [16 cmH20-30 cmH20] 28 cmH20   Intake/Output Summary (Last 24 hours) at 10/13/2020 0911 Last data filed at 10/13/2020 0800 Gross per 24 hour  Intake 1380 ml  Output 2000 ml  Net -620 ml   Filed Weights   10/10/20 0500 10/11/20 0500 10/13/20 0339  Weight: 80 kg 80.4 kg 80 kg   Physical Examination: BP (!) 104/49 (BP Location: Right Arm)   Pulse 90   Temp 98.5 F (36.9 C) (Oral)   Resp (!) 24  Ht 5\' 6"  (1.676 m)   Wt 80 kg   SpO2 100%   BMI 28.47 kg/m    Gen:   Elderly male, trach in place on vent HEENT: Trach in place no significant secretions Lungs:    Bilateral vented breath sounds CV:      regular rate rhythm, S1-S2 Abd: Large ventral hernia, soft Ext: No significant edema Skin: No rash Neuro: Alert oriented this morning following commands  Labs/imaging that I have  personally reviewed: (right click and "Reselect all SmartList Selections" daily)   Sodium 146  Potassium: 3.8 Serum creatinine 2.0 White blood cell count 10.7 Hemoglobin 8.2  Resolved Hospital Problem List   NSTEMI, Thrombocytopenia  Assessment & Plan:   Acute hypoxemic hypercapnic respiratory failure requiring mechanical ventilation. Prolonged vent weaning due to NM weakness. Possible aspiration Plan: Routine trach care Continue on pressure support ventilation Wean from pressure support once stable on 8/5 or 5/5 can consider brief TCT. Otherwise would consider working with speech with inline PMV  Critically ill due to RV dysfunction following ACS and cardiopulmonary bypass  Cardiogenic shock NSTEMI with culprit RCA involvement Coronary artery disease Plan: Continue amiodarone, Lasix, hydralazine, isosorbide Holding Entresto due to elevated serum creatinine  Acute kidney injury Uremia Hypernatremia Plan: Follow urine output and kidney function  Gastric ileus, improved Plan: Tube feeds  Anemia (Baseline ~10) Plan: Observe  Agitated delirium controlled H/o alcohol abuse Plan: Continue Seroquel, Requip nightly, twice daily Klonopin, valproic acid  RLS Plan: Nightly Requip  DM Plan: Lantus plus SSI  Mobility, Deconditioning  Plan:  PT OT   Best Practice: (right click and "Reselect all SmartList Selections" daily)  Diet:  Tube Feed   Pain/Anxiety/Delirium protocol (if indicated): Yes (RASS goal -1) VAP protocol (if indicated): Yes DVT prophylaxis: Subcutaneous Heparin GI prophylaxis: PPI Glucose control:  SSI Yes and Basal insulin Yes, Central venous access:  Yes, and it is still needed Arterial line:  N/A Foley:  Yes, and it is still needed Mobility:  bed rest  PT consulted: N/A Last date of multidisciplinary goals of care discussion per primary team  Code Status:  full code Disposition: Stable for transfer to long-term care   , DO [facility] 10/13/2020 9:11 AM

## 2020-10-14 DIAGNOSIS — I5021 Acute systolic (congestive) heart failure: Secondary | ICD-10-CM | POA: Diagnosis not present

## 2020-10-14 DIAGNOSIS — Z951 Presence of aortocoronary bypass graft: Secondary | ICD-10-CM | POA: Diagnosis not present

## 2020-10-14 DIAGNOSIS — N179 Acute kidney failure, unspecified: Secondary | ICD-10-CM | POA: Diagnosis not present

## 2020-10-14 DIAGNOSIS — I214 Non-ST elevation (NSTEMI) myocardial infarction: Secondary | ICD-10-CM | POA: Diagnosis not present

## 2020-10-14 LAB — BASIC METABOLIC PANEL
Anion gap: 7 (ref 5–15)
Anion gap: 9 (ref 5–15)
BUN: 126 mg/dL — ABNORMAL HIGH (ref 8–23)
BUN: 135 mg/dL — ABNORMAL HIGH (ref 8–23)
CO2: 29 mmol/L (ref 22–32)
CO2: 28 mmol/L (ref 22–32)
Calcium: 8.5 mg/dL — ABNORMAL LOW (ref 8.9–10.3)
Calcium: 8.4 mg/dL — ABNORMAL LOW (ref 8.9–10.3)
Chloride: 115 mmol/L — ABNORMAL HIGH (ref 98–111)
Chloride: 114 mmol/L — ABNORMAL HIGH (ref 98–111)
Creatinine, Ser: 2.43 mg/dL — ABNORMAL HIGH (ref 0.61–1.24)
Creatinine, Ser: 2.62 mg/dL — ABNORMAL HIGH (ref 0.61–1.24)
GFR, Estimated: 27 mL/min — ABNORMAL LOW (ref >60)
GFR, Estimated: 24 mL/min — ABNORMAL LOW (ref >60)
Glucose, Bld: 112 mg/dL — ABNORMAL HIGH (ref 70–99)
Glucose, Bld: 123 mg/dL — ABNORMAL HIGH (ref 70–99)
Potassium: 4.8 mmol/L (ref 3.5–5.1)
Potassium: 4.8 mmol/L (ref 3.5–5.1)
Sodium: 151 mmol/L — ABNORMAL HIGH (ref 135–145)
Sodium: 151 mmol/L — ABNORMAL HIGH (ref 135–145)

## 2020-10-14 LAB — CBC
HCT: 26.6 % — ABNORMAL LOW (ref 39.0–52.0)
Hemoglobin: 7.8 g/dL — ABNORMAL LOW (ref 13.0–17.0)
MCH: 27.2 pg (ref 26.0–34.0)
MCHC: 29.3 g/dL — ABNORMAL LOW (ref 30.0–36.0)
MCV: 92.7 fL (ref 80.0–100.0)
Platelets: 348 10*3/uL (ref 150–400)
RBC: 2.87 MIL/uL — ABNORMAL LOW (ref 4.22–5.81)
RDW: 18.8 % — ABNORMAL HIGH (ref 11.5–15.5)
WBC: 10.8 10*3/uL — ABNORMAL HIGH (ref 4.0–10.5)
nRBC: 0 % (ref 0.0–0.2)

## 2020-10-14 LAB — GLUCOSE, CAPILLARY
Glucose-Capillary: 104 mg/dL — ABNORMAL HIGH (ref 70–99)
Glucose-Capillary: 123 mg/dL — ABNORMAL HIGH (ref 70–99)
Glucose-Capillary: 131 mg/dL — ABNORMAL HIGH (ref 70–99)
Glucose-Capillary: 83 mg/dL (ref 70–99)
Glucose-Capillary: 87 mg/dL (ref 70–99)
Glucose-Capillary: 91 mg/dL (ref 70–99)

## 2020-10-14 LAB — COOXEMETRY PANEL
Carboxyhemoglobin: 1.1 % (ref 0.5–1.5)
Methemoglobin: 0.7 % (ref 0.0–1.5)
O2 Saturation: 91.8 %
Total hemoglobin: 8.2 g/dL — ABNORMAL LOW (ref 12.0–16.0)

## 2020-10-14 MED ORDER — FREE WATER
200.0000 mL | Freq: Four times a day (QID) | Status: DC
  Administered 2020-10-14 – 2020-10-15 (×5): 200 mL

## 2020-10-14 MED ORDER — LACTATED RINGERS IV BOLUS
1000.0000 mL | Freq: Once | INTRAVENOUS | Status: AC
  Administered 2020-10-14: 1000 mL via INTRAVENOUS

## 2020-10-14 MED ORDER — QUETIAPINE FUMARATE 100 MG PO TABS
100.0000 mg | ORAL_TABLET | Freq: Two times a day (BID) | ORAL | Status: DC
  Administered 2020-10-14 – 2020-10-16 (×5): 100 mg
  Filled 2020-10-14 (×5): qty 1

## 2020-10-14 NOTE — Progress Notes (Signed)
      [address]       [place] [address]             [phone]      PM rounds  BP 136/70   Pulse 94   Temp 98.4 F (36.9 C)   Resp (!) 22   Ht 5\' 6"  (1.676 m)   Wt 80 kg   SpO2 98%   BMI 28.47 kg/m   Intake/Output Summary (Last 24 hours) at 10/14/2020 1715 Last data filed at 10/14/2020 1500 Gross per 24 hour  Intake 1320 ml  Output 1200 ml  Net 120 ml   UOP OK, creatinine 2.6 this AM  10/16/2020 C. Viviann Spare, MD [facility] [phone]

## 2020-10-14 NOTE — Progress Notes (Signed)
Patient ID: Jack Vance, male   DOB: [birth year], 80 y.o.   MRN: [id number]    Advanced Heart Failure Rounding Note   Subjective:    Underwent emergent CABG on 5/6 for critical LM disease (LIMA -> LAD, SVG -> OM, SVG -> RCA) 5/6 Cultures negative.  5/8 Possible aspiration. A fib --> started amio drip.  Given 2u PRBCs. Hypotensive in the evening and started on vasopressin + antibiotics.  5/9 IABP removed 5/10 NG placed >1.5 out  5/11 started neostigmime 5/12 Febrile 102 5/17 Failed SBT. Underwent bronchoscopy for attempted therapeutic aspiration for mucus plugging. ETT exchanged.  5/19 Hgb 6.1 given 1UPRBC 5/21  Failed SBT  5/23 Trach.   Awake on vent via trach. Will follow commands but till agitated at times  BUN/Cr up. 126/2.43 on D5 $Rem'@50'VhAi$ . Na 151    Objective:   Weight Range:  Vital Signs:   Temp:  [97.5 F (36.4 C)-98.5 F (36.9 C)] 97.6 F (36.4 C) (05/27 0326) Pulse Rate:  [83-105] 105 (05/27 0328) Resp:  [20-31] 22 (05/27 0700) BP: (104-147)/(49-78) 124/63 (05/27 0700) SpO2:  [97 %-100 %] 99 % (05/27 0700) FiO2 (%):  [40 %] 40 % (05/27 0328) Weight:  [80 kg] 80 kg (05/27 0419) Last BM Date: 10/12/20  Weight change: Filed Weights   10/11/20 0500 10/13/20 0339 10/14/20 0419  Weight: 80.4 kg 80 kg 80 kg    Intake/Output:   Intake/Output Summary (Last 24 hours) at 10/14/2020 0740 Last data filed at 10/14/2020 0500 Gross per 24 hour  Intake 2000 ml  Output 900 ml  Net 1100 ml    PHYSICAL EXAM: General:  Chronically ill appearing. No resp difficulty HEENT: normal + cor-trak Neck: supple. no JVD. + trach Carotids 2+ bilat; no bruits. No lymphadenopathy or thryomegaly appreciated. Cor: PMI nondisplaced. Irregular rate & rhythm. No rubs, gallops or murmurs. Lungs: clear Abdomen: soft, nontender, nondistended. No hepatosplenomegaly. No bruits or masses. Good bowel sounds. + ventral hernia Extremities: no cyanosis, clubbing, rash, edema + boots Neuro: awake  will follow commands  Telemetry: Sinus rhythm with possible SN re-entry rhythm Personally reviewed   Labs: Basic Metabolic Panel: Recent Labs  Lab 10/10/20 0342 10/11/20 0335 10/12/20 0344 10/13/20 0316 10/14/20 0456  NA 138 137 143 146* 151*  K 3.6 4.0 3.9 3.8 4.8  CL 102 105 107 109 115*  CO2 $Re'27 25 26 27 29  'Zki$ GLUCOSE 139* 231* 145* 155* 112*  BUN 93* 83* 93* 101* 126*  CREATININE 1.92* 1.55* 1.98* 2.02* 2.43*  CALCIUM 7.7* 7.8* 8.5* 8.5* 8.5*    Liver Function Tests: No results for input(s): AST, ALT, ALKPHOS, BILITOT, PROT, ALBUMIN in the last 168 hours. No results for input(s): LIPASE, AMYLASE in the last 168 hours. No results for input(s): AMMONIA in the last 168 hours.  CBC: Recent Labs  Lab 10/10/20 0342 10/11/20 0335 10/12/20 0344 10/13/20 0316 10/14/20 0456  WBC 6.8 9.7 11.7* 10.7* 10.8*  HGB 9.1* 8.0* 8.7* 8.2* 7.8*  HCT 29.2* 25.8* 27.8* 26.7* 26.6*  MCV 88.8 89.6 89.7 90.5 92.7  PLT 230 250 323 325 348    Cardiac Enzymes: No results for input(s): CKTOTAL, CKMB, CKMBINDEX, TROPONINI in the last 168 hours.  BNP: BNP (last 3 results) Recent Labs    09/23/20 0311  BNP 1,191.8*    ProBNP (last 3 results) No results for input(s): PROBNP in the last 8760 hours.    Other results:  Imaging: No results found.   Medications:  Scheduled Medications: . amiodarone  200 mg Per Tube Daily  . aspirin  324 mg Per Tube Daily  . atorvastatin  80 mg Per Tube Daily  . chlorhexidine gluconate (MEDLINE KIT)  15 mL Mouth Rinse BID  . Chlorhexidine Gluconate Cloth  6 each Topical Daily  . clonazePAM  0.5 mg Per Tube BID  . clopidogrel  75 mg Per Tube Daily  . feeding supplement (PIVOT 1.5 CAL)  1,000 mL Per Tube Q24H  . feeding supplement (PROSource TF)  45 mL Per Tube BID  . folic acid  1 mg Per Tube Daily  . heparin injection (subcutaneous)  5,000 Units Subcutaneous Q8H  . hydrALAZINE  37.5 mg Per Tube Q8H  . insulin aspart  0-24 Units  Subcutaneous Q4H  . insulin aspart  6 Units Subcutaneous Q4H  . insulin glargine  5 Units Subcutaneous BID  . isosorbide dinitrate  10 mg Per Tube TID  . mouth rinse  15 mL Mouth Rinse 10 times per day  . pantoprazole sodium  40 mg Per Tube Daily  . polyethylene glycol  17 g Per Tube Daily  . QUEtiapine  150 mg Per Tube BID  . rOPINIRole  0.5 mg Per Tube QHS  . sodium chloride flush  10-40 mL Intracatheter Q12H  . sodium chloride flush  3 mL Intravenous Q12H  . thiamine  100 mg Per Tube Daily  . valproic acid  250 mg Per Tube BID    Infusions: . dextrose Stopped (10/11/20 0724)  . lactated ringers Stopped (10/01/20 0607)    PRN Medications: acetaminophen (TYLENOL) oral liquid 160 mg/5 mL, albuterol, fentaNYL (SUBLIMAZE) injection, Gerhardt's butt cream, lip balm, metoprolol tartrate, ondansetron (ZOFRAN) IV, oxyCODONE, sodium chloride flush, sodium chloride flush, sorbitol   Assessment/Plan:   1. 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  - Co-ox 92%.(?) Stable off milrinone.   - Holding diuretics w/ AKI. Wt stable  - No ACE/ARB/ARNI/MRA with AKI. No b-blocker yet with shock.  - c/w hydral/nitrates for afterload reduction BP ok   2. NSTEMI with critical CAD - 95% LM and high grade ostial RCA - CABG on 5/6 for critical LM disease (LIMA -> LAD, SVG -> OM, SVG -> RCA) - Continue ASA + Plavix   - Continue statin.   3. Acute hypoxic respiratory failure due to pulmonary edema - on vent.  - Possible aspiration 5/8 & 5/10  - 5/6 Blood CX - NG  - Antibiotics completed 5/16 - CCM managing--> S/P trach  - Failing vent wean due to agitation and weakness   4. AKI / Uremia  - due to ATN - SCr climbing again. Na up. Suspect he is dry - add back free water. Continue D5 - will given 1L LR - hold all diuretics  5.  Ventral Hernia  - 5/10 NG placed to decompress   6. ID - Finished zosyn for possible aspiration 5/16.  - Blood CX 5/6- NGTD, UC 5/13 NGTD  - Afebrile   7. Anemia - Given 1 unit PRBC 5/19. - Hgb 7.8  , transfuse < 7.5.   8. Hypernatremia  - Na 146 -> 151 today, - add FW and bolus LR  9. Atrial fibrillation - In NSR  - Continue amio 200 mg daily.   10. Acute encephalopathy - improved today.  - stop VPA,  drop seroquel   Apparently accepted for a bed at [facility] but no note in chart. Given worsening renal function and hypernatremia would hold on transfer today.  Length of Stay: 21   Glori Bickers, MD  7:40 AM

## 2020-10-14 NOTE — Progress Notes (Signed)
Physical Therapy Treatment Patient Details Name: Jack Vance MRN: [id number] DOB: Nov 21, 1940 Today's Date: 10/14/2020    History of Present Illness 80 yo admitted 5/6 with cardiogenic shock secondary to NSTEMI with acute pulmonary edema and heart failure s/p urgent LHC. 5/6 IABP and CABG with IABP out 5/9. Pt remained intubated post op. Trach 5/23. PMhx: arthritis, asthma, DDD, RLS    PT Comments    Pt lethargic on arrival with automatic movement of bil LE and UE in bed before and after session. Pt with improved sitting balance but remains with tendency for anterior bias and flexed trunk vs posterior right bias. PT able to follow commands to perform RLE LAQ today and stand but did not provide significant assist to safely transfer OOB. Will continue to follow. Pt educated for sternal precautions but no evidence of learning.   Vent PRVC, 40% Fio2 with SpO2 97% HR 101 Pre activity 148/64, post 120/71    Follow Up Recommendations  LTACH;Supervision/Assistance - 24 hour     Equipment Recommendations  Hospital bed;Wheelchair (measurements PT)    Recommendations for Other Services       Precautions / Restrictions Precautions Precautions: Fall;Sternal;Other (comment) Precaution Comments: NG tube/suction, trach, vent, cortrak, flexiseal, primo fit    Mobility  Bed Mobility Overal bed mobility: Needs Assistance Bed Mobility: Supine to Sit;Sit to Supine     Supine to sit: Total assist Sit to supine: Total assist   General bed mobility comments: Total A for foot egress supine<>sit. Max assist to scoot pelvis forward at EOb with mod assist for sitting balance and facilitation into trunk extension. Pt sat EOB 13 min with decreased assist from initial eval. Total + 2 to slide toward HOB end of session    Transfers Overall transfer level: Needs assistance   Transfers: Sit to/from Stand Sit to Stand: Max assist;+2 safety/equipment         General transfer comment: bil knees  blocked with use of pad to craddle sacrum and max +2 for attempted standing x 2. Pt able to clear sacrum but lack of significant assist from pt into standing and lack of trunk extension  Ambulation/Gait                 Stairs             Wheelchair Mobility    Modified Rankin (Stroke Patients Only)       Balance Overall balance assessment: Needs assistance Sitting-balance support: Single extremity supported;Bilateral upper extremity supported;Feet supported Sitting balance-Leahy Scale: Poor Sitting balance - Comments: mod -min assist sitting EOB     Standing balance-Leahy Scale: Zero Standing balance comment: max +2 assist                            Cognition Arousal/Alertness: Lethargic Behavior During Therapy: Flat affect Overall Cognitive Status: Difficult to assess                     Current Attention Level: Focused   Following Commands: Follows one step commands inconsistently;Follows one step commands with increased time     Problem Solving: Slow processing;Requires verbal cues;Requires tactile cues;Decreased initiation;Difficulty sequencing General Comments: trach to vent, pt with minor smirk in response to comments at times will squeeze hands on command and at times assist with RLE knee extension, unable to determine if partially due to [facility]      Exercises General Exercises - Lower Extremity Long Arc Quad:  AAROM;PROM;Right;Left;Seated;10 reps (AAROM on RLe, PROM on LLE)    General Comments        Pertinent Vitals/Pain Faces Pain Scale: Hurts a little bit Pain Location: with initial extension of left knee Pain Descriptors / Indicators: Grimacing Pain Intervention(s): Limited activity within patient's tolerance;Monitored during session;Repositioned    Home Living                      Prior Function            PT Goals (current goals can now be found in the care plan section) Progress towards PT goals:  Progressing toward goals    Frequency    Min 3X/week      PT Plan Current plan remains appropriate    Co-evaluation PT/OT/SLP Co-Evaluation/Treatment: Yes Reason for Co-Treatment: Complexity of the patient's impairments (multi-system involvement);For patient/therapist safety PT goals addressed during session: Mobility/safety with mobility;Balance;Strengthening/ROM        AM-PAC PT "6 Clicks" Mobility   Outcome Measure  Help needed turning from your back to your side while in a flat bed without using bedrails?: Total Help needed moving from lying on your back to sitting on the side of a flat bed without using bedrails?: Total Help needed moving to and from a bed to a chair (including a wheelchair)?: Total Help needed standing up from a chair using your arms (e.g., wheelchair or bedside chair)?: Total Help needed to walk in hospital room?: Total Help needed climbing 3-5 steps with a railing? : Total 6 Click Score: 6    End of Session Equipment Utilized During Treatment:  (vent) Activity Tolerance: Patient tolerated treatment well Patient left: with call bell/phone within reach;in bed;with bed alarm set Nurse Communication: Mobility status;Need for lift equipment PT Visit Diagnosis: Other abnormalities of gait and mobility (R26.89);Muscle weakness (generalized) (M62.81)     Time: 3716-9678 PT Time Calculation (min) (ACUTE ONLY): 24 min  Charges:  $Therapeutic Activity: 8-22 mins                     Johnnisha Forton P, PT Acute Rehabilitation Services Pager: [phone] Office: [phone]    Cortnie Ringel B Kaelyn Nauta 10/14/2020, 1:49 PM

## 2020-10-14 NOTE — Plan of Care (Signed)

## 2020-10-14 NOTE — Progress Notes (Signed)
Occupational Therapy Treatment Patient Details Name: Jack Vance MRN: [id number] DOB: Apr 08, 1941 Today's Date: 10/14/2020    History of present illness 80 yo admitted 5/6 with cardiogenic shock secondary to NSTEMI with acute pulmonary edema and heart failure s/p urgent LHC. 5/6 IABP and CABG with IABP out 5/9. Pt remained intubated post op. Trach 5/23. PMhx: arthritis, asthma, DDD, RLS   OT comments  Patient supine in bed, engaged in OT/PT session.  Utilized bed to chair egress, total assist.  Sitting EOB requires at best min guard briefly up to mod assist for static sitting balance for 13 minutes.  At times anterior lean or posterior right lean, inconsistent.  Requires total assist for grooming tasks, hand over hand assist with no initiation to engage in tasks presented. Able to squeeze with B hands, open eyes, but lethargic and unable to maintain sustained open gaze.  Pt on vent PRVC 40% fiO2 with Spo2 97 %, VSS (HR 101, BP pre 148/64, post 120/71).       Follow Up Recommendations  LTACH    Equipment Recommendations  Other (comment) (TBD at next venue of care)    Recommendations for Other Services      Precautions / Restrictions Precautions Precautions: Fall;Sternal;Other (comment) Precaution Booklet Issued: No Precaution Comments: NG tube/suction, trach, vent, cortrak, flexiseal, primo fit Restrictions Weight Bearing Restrictions: Yes Other Position/Activity Restrictions: sternal precautions       Mobility Bed Mobility Overal bed mobility: Needs Assistance Bed Mobility: Supine to Sit;Sit to Supine     Supine to sit: Total assist Sit to supine: Total assist   General bed mobility comments: total assist for foot egrees supine <>sit.  Sat EOB for 13 minutes.    Transfers Overall transfer level: Needs assistance   Transfers: Sit to/from Stand Sit to Stand: Max assist;+2 safety/equipment         General transfer comment: bil knees blocked with use of pad to craddle  sacrum and max +2 for attempted standing x 2. Pt able to clear sacrum but lack of significant assist from pt into standing and lack of trunk extension    Balance Overall balance assessment: Needs assistance Sitting-balance support: Single extremity supported;Feet supported Sitting balance-Leahy Scale: Poor Sitting balance - Comments: mod -min assist sitting EOB; flucutates between anterior and posterior leans     Standing balance-Leahy Scale: Zero Standing balance comment: max +2 assist                           ADL either performed or assessed with clinical judgement   ADL Overall ADL's : Needs assistance/impaired Eating/Feeding: NPO   Grooming: Total assistance;Sitting;Wash/dry face;Brushing hair Grooming Details (indicate cue type and reason): hand over hand assist required to wash face and comb hair, pt with no initation to engage in tasks             Lower Body Dressing: Total assistance;+2 for physical assistance;+2 for safety/equipment;Bed level Lower Body Dressing Details (indicate cue type and reason): to adjust socks             Functional mobility during ADLs: Total assistance;+2 for physical assistance;+2 for safety/equipment       Vision       Perception     Praxis      Cognition Arousal/Alertness: Lethargic Behavior During Therapy: Flat affect Overall Cognitive Status: Difficult to assess Area of Impairment: Following commands;Attention;Problem solving  Current Attention Level: Focused   Following Commands: Follows one step commands inconsistently;Follows one step commands with increased time     Problem Solving: Slow processing;Decreased initiation;Difficulty sequencing;Requires verbal cues;Requires tactile cues General Comments: pt on vent via trach, follows simple commands with increased time but very inconsistent.  difficult to assess, no verbalizations or mouthing of words; [facility]        Exercises  General Exercises - Lower Extremity Long Arc Quad: AAROM;PROM;Right;Left;Seated;10 reps (AAROM on RLe, PROM on LLE)   Shoulder Instructions       General Comments On vent via trach PRVC 40% FiO2, PEEP 5.  VSS    Pertinent Vitals/ Pain       Pain Assessment: Faces Faces Pain Scale: Hurts a little bit Pain Location: with initial extension of left knee Pain Descriptors / Indicators: Grimacing Pain Intervention(s): Limited activity within patient's tolerance;Monitored during session;Repositioned  Home Living                                          Prior Functioning/Environment              Frequency  Min 2X/week        Progress Toward Goals  OT Goals(current goals can now be found in the care plan section)  Progress towards OT goals: Progressing toward goals  Acute Rehab OT Goals Patient Stated Goal: none stated OT Goal Formulation: Patient unable to participate in goal setting  Plan Discharge plan remains appropriate;Frequency remains appropriate    Co-evaluation    PT/OT/SLP Co-Evaluation/Treatment: Yes Reason for Co-Treatment: Complexity of the patient's impairments (multi-system involvement);Necessary to address cognition/behavior during functional activity;For patient/therapist safety PT goals addressed during session: Mobility/safety with mobility;Balance;Strengthening/ROM OT goals addressed during session: ADL's and self-care      AM-PAC OT "6 Clicks" Daily Activity     Outcome Measure   Help from another person eating meals?: Total Help from another person taking care of personal grooming?: Total Help from another person toileting, which includes using toliet, bedpan, or urinal?: Total Help from another person bathing (including washing, rinsing, drying)?: Total Help from another person to put on and taking off regular upper body clothing?: Total Help from another person to put on and taking off regular lower body clothing?: Total 6  Click Score: 6    End of Session Equipment Utilized During Treatment: Other (comment) (vent via trach)  OT Visit Diagnosis: Muscle weakness (generalized) (M62.81);Other abnormalities of gait and mobility (R26.89);Other symptoms and signs involving cognitive function   Activity Tolerance Patient limited by fatigue;Patient limited by lethargy   Patient Left in bed;with call bell/phone within reach;with bed alarm set   Nurse Communication Mobility status        Time: 4098-1191 OT Time Calculation (min): 24 min  Charges: OT General Charges $OT Visit: 1 Visit OT Treatments $Self Care/Home Management : 8-22 mins  [facility] Brunner, OT Acute Rehabilitation Services Pager [phone] Office [phone]    Chancy Milroy 10/14/2020, 3:18 PM

## 2020-10-14 NOTE — TOC Progression Note (Addendum)
Transition of Care [facility]) - Progression Note    Patient Details  Name: Jack Vance MRN: [id number] Date of Birth: 28-Feb-1941  [facility] CM/SW Contact  Graves-Bigelow, Lamar Laundry, RN Phone Number: 10/14/2020, 2:28 PM  Clinical Narrative:   Late Entry: Case Manager received notification that the insurance has been approved for LTAC. Per the Case Manager @ the [place] -the patient has 72 hours to transfer to LTAC; if no transfer within 72 hours will have to reinitiate authorization. Bed is available at [facility] today. Per MD patient will stay on the unit to monitor renal function. Unsure if bed will be available on Saturday. Transitions of Care team will continue to follow for additional needs.   Expected Discharge Plan: Long Term Acute Care (LTAC) Barriers to Discharge: No Barriers Identified  Expected Discharge Plan and Services Expected Discharge Plan: Long Term Acute Care (LTAC) In-house Referral: Clinical Social Work Discharge Planning Services: CM Consult Post Acute Care Choice: Long Term Acute Care (LTAC) Living arrangements for the past 2 months: Apartment                  Readmission Risk Interventions Readmission Risk Prevention Plan 10/12/2020  Transportation Screening Complete  HRI or Home Care Consult Complete  Social Work Consult for Recovery Care Planning/Counseling Complete  Palliative Care Screening Not Applicable  Medication Review [employer]) Complete  Some recent data might be hidden

## 2020-10-14 NOTE — Progress Notes (Signed)
NAME:  Jack Vance, MRN:  [id number], DOB:  Apr 10, 1941, LOS: 21 ADMISSION DATE:  09/23/2020, CONSULTATION DATE:  09/24/2019 REFERRING MD:  Jacinto Halim, CHIEF COMPLAINT:  Acute coronary syndrome   History of Present Illness:  80 year old man who presented with cardiogenic shock secondary to NSTEMI.  Presented with new onset RSCP and severe dyspnea. Found to have acute pulmonary edema and heart failure; intubated and sent for urgent LHC which revealed 3V CAD.  IABP inserted and patient underwent urgent CABG. IABP removed 5/9. Continues to require full mechanical ventilation support.  Pertinent  Medical History   Past Medical History:  Diagnosis Date  . Arthritis   . Asthma   . DDD (degenerative disc disease), lumbar   . Hernia of abdominal wall   . RLS (restless legs syndrome)    Significant Hospital Events: Including procedures, antibiotic start and stop dates in addition to other pertinent events    . 5/6 CABG with Dr Cliffton Asters with LIMA to LAD, SVG to RCA d and SVG to ramus. LV normal and returned to ICU with no vasopressor support.  . No post-operative bleeding but poor RV performance on hemodynamics.  . 5/8 significant hemodynamic instability with labile blood pressure and vasopressor requirements yesterday.  Episode of emesis but no evidence of bowel obstruction.  Evidence of pericardial tamponade. . 5/9 improved hemodynamic, able now to tolerate balloon pump weaning and removal. . 5/11 Stable hemodynamics, continuing diuresis. Improved UOP. TPN consult for initiation 5/12 in the setting of ileus, prolonged NPO status.  . 5/13 sedated, diuresis, milrinone, PICC line today, CVL removed  . 5/14 tolerated short SBT/SAT on Precedex . 5/16 Not tolerating SBT due to significant tachypnea to 50s, placed back on PRVC. Marland Kitchen 5/17 Improved SBT toleration, weaning milrinone, minimizing sedation. Bronched w/ c/f mucus plugging, airways clear, ETT exchanged due to significant buildup. Diuresis  held. . 5/18 More agitated overnight requiring increased sedation with addition of Fentanyl/Versed, Seroquel started. BUN continues to rise. Nephro consult.   5/19 Continues to be agitated overnight requiring increased sedation with fentanyl/versed. Started Klonopin, Seroquel and Requip yesterday. Started on valproate and seroquel increased.  5/20 Patient more interactive following commands and maintaining eyes open and tracking.  5/21 failing SBT's.  Plan for trach next week  5/23 Trach (BI/DS)  5/25 NGT removed, cortrack in place for TF   5/26 pressure support trial  5/27 worsening kideny function   Interim History / Subjective:   Remains on mechanical vent support.  Kidney function worsening  Objective   Blood pressure 120/71, pulse (!) 101, temperature 98.6 F (37 C), temperature source Axillary, resp. rate (!) 22, height 5\' 6"  (1.676 m), weight 80 kg, SpO2 97 %.    Vent Mode: PRVC FiO2 (%):  [40 %] 40 % Set Rate:  [22 bmp] 22 bmp Vt Set:  [560 mL] 560 mL PEEP:  [5 cmH20] 5 cmH20 Plateau Pressure:  [18 cmH20-26 cmH20] 26 cmH20   Intake/Output Summary (Last 24 hours) at 10/14/2020 1401 Last data filed at 10/14/2020 1300 Gross per 24 hour  Intake 1160 ml  Output 1200 ml  Net -40 ml   Filed Weights   10/11/20 0500 10/13/20 0339 10/14/20 0419  Weight: 80.4 kg 80 kg 80 kg   Physical Examination: BP 120/71   Pulse (!) 101   Temp 98.6 F (37 C) (Axillary)   Resp (!) 22   Ht 5\' 6"  (1.676 m)   Wt 80 kg   SpO2 97%   BMI 28.47 kg/m  Gen:   Elderly male, tracheostomy tube in place HEENT: Trach in place no significant secretions Lungs: Bilateral mechanically ventilated breath sounds CV:     regular rate rhythm, S1-S2 Abd: Large central ventral hernia, soft Ext: No significant edema Skin: No rash Neuro: Alert oriented following commands  Labs/imaging that I have personally reviewed: (right click and "Reselect all SmartList Selections" daily)   Sodium  146  Potassium: 3.8 Serum creatinine 2.0 White blood cell count 10.7 Hemoglobin 8.2  Resolved Hospital Problem List   NSTEMI, Thrombocytopenia  Assessment & Plan:   Acute hypoxemic hypercapnic respiratory failure requiring mechanical ventilation. Prolonged vent weaning due to NM weakness. Possible aspiration Plan: Routine trach care Continue pressure support ventilation Continue to titrate down as tolerated Consider T CTS soon as possible If not may consider inline PMV.  Critically ill due to RV dysfunction following ACS and cardiopulmonary bypass  Cardiogenic shock NSTEMI with culprit RCA involvement Coronary artery disease Plan: Continue amiodarone, Lasix, hydralazine, isosorbide Holding Entresto due to elevated serum creatinine  Acute kidney injury Uremia Hypernatremia Plan: Follow urine output and kidney function Worsening AKI  Continue to observe   Gastric ileus, improved Plan: Continue tube feeds  Anemia (Baseline ~10) Plan: Observe  Agitated delirium controlled H/o alcohol abuse Plan: Continue Seroquel, Requip nightly, twice daily Klonopin Valproic acid  RLS Plan: Requip nightly  DM Plan: Lantus plus SSI  Mobility, Deconditioning  Plan:  PT plus OT   Best Practice: (right click and "Reselect all SmartList Selections" daily)  Diet:  Tube Feed   Pain/Anxiety/Delirium protocol (if indicated): Yes (RASS goal -1) VAP protocol (if indicated): Yes DVT prophylaxis: Subcutaneous Heparin GI prophylaxis: PPI Glucose control:  SSI Yes and Basal insulin Yes, Central venous access:  Yes, and it is still needed Arterial line:  N/A Foley:  Yes, and it is still needed Mobility:  bed rest  PT consulted: N/A Last date of multidisciplinary goals of care discussion per primary team  Code Status:  full code Disposition: worsening kidney function. Holding on transfer    Josephine Igo, DO [facility] 10/14/2020 2:01 PM

## 2020-10-14 NOTE — Progress Notes (Signed)
      [address]       [place] [address]             [phone]                 21 Days Post-Op Procedure(s) (LRB): CORONARY ARTERY BYPASS GRAFTING (CABG)  TIMES 4 USING LEFT GREATER SAPHENOUS VEIN HARVESTED ENDOSCOPICALLY AND LEFT INTERNAL MAMMARY ARTERY (N/A) TRANSESOPHAGEAL ECHOCARDIOGRAM (TEE) (N/A)   Events: No events Remained on vent overnight  _______________________________________________________________ Vitals: BP 125/71   Pulse (!) 105   Temp 97.6 F (36.4 C) (Oral)   Resp (!) 22   Ht 5\' 6"  (1.676 m)   Wt 80 kg   SpO2 98%   BMI 28.47 kg/m   - Neuro: awake  - Cardiovascular: Normal sinus rhythm.  I  Drips:  none    - Pulm:  Vent Mode: PRVC FiO2 (%):  [40 %] 40 % Set Rate:  [22 bmp] 22 bmp Vt Set:  [560 mL] 560 mL PEEP:  [5 cmH20] 5 cmH20 Pressure Support:  [16 cmH20] 16 cmH20 Plateau Pressure:  [17 cmH20-24 cmH20] 23 cmH20  ABG    Component Value Date/Time   PHART 7.410 09/26/2020 0539   PCO2ART 31.1 (L) 09/26/2020 0539   PO2ART 136 (H) 09/26/2020 0539   HCO3 19.9 (L) 09/26/2020 0539   TCO2 21 (L) 09/26/2020 0539   ACIDBASEDEF 4.0 (H) 09/26/2020 0539   O2SAT 91.8 10/14/2020 0456    - Abd: distended, but soft - Extremity: Warm  .Intake/Output      05/26 0701 05/27 0700   NG/GT 2000   Total Intake(mL/kg) 2000 (25)   Urine (mL/kg/hr) 900 (0.5)   Total Output 900   Net +1100          _______________________________________________________________ Labs: CBC Latest Ref Rng & Units 10/14/2020 10/13/2020 10/12/2020  WBC 4.0 - 10.5 K/uL 10.8(H) 10.7(H) 11.7(H)  Hemoglobin 13.0 - 17.0 g/dL 7.8(L) 8.2(L) 8.7(L)  Hematocrit 39.0 - 52.0 % 26.6(L) 26.7(L) 27.8(L)  Platelets 150 - 400 K/uL 348 325 323   CMP Latest Ref Rng & Units 10/14/2020 10/13/2020 10/12/2020  Glucose 70 - 99 mg/dL 10/14/2020) 767(M) 094(B)  BUN 8 - 23 mg/dL 096(G) 836(O) 294(T)  Creatinine 0.61 - 1.24 mg/dL 65(Y) 6.50(P) 5.46(F)  Sodium 135 - 145 mmol/L  151(H) 146(H) 143  Potassium 3.5 - 5.1 mmol/L 4.8 3.8 3.9  Chloride 98 - 111 mmol/L 115(H) 109 107  CO2 22 - 32 mmol/L 29 27 26   Calcium 8.9 - 10.3 mg/dL 6.81(E) ) 7.5(T)  Total Protein 6.5 - 8.1 g/dL - - -  Total Bilirubin 0.3 - 1.2 mg/dL - - -  Alkaline Phos 38 - 126 U/L - - -  AST 15 - 41 U/L - - -  ALT 0 - 44 U/L - - -    CXR: -  _______________________________________________________________  Assessment and Plan: POD 21 s/p emergency CABG.  Neuro: agitation improving CV:  Off milr.   Pulm: wean vent as tolerated Renal: making good urine.  Creat trending down. GI: on goal tube feeds. Heme: Stable 7.0(Y.   Endo: Sliding scale insulin. Dispo: Continue ICU care.  Accepted to [facility] for vent wean.  Ok for transfer from a surgical standpoint  1.7(C 10/14/2020 6:28 AM

## 2020-10-15 DIAGNOSIS — R570 Cardiogenic shock: Secondary | ICD-10-CM | POA: Diagnosis not present

## 2020-10-15 DIAGNOSIS — I5021 Acute systolic (congestive) heart failure: Secondary | ICD-10-CM | POA: Diagnosis not present

## 2020-10-15 DIAGNOSIS — I214 Non-ST elevation (NSTEMI) myocardial infarction: Secondary | ICD-10-CM | POA: Diagnosis not present

## 2020-10-15 DIAGNOSIS — N179 Acute kidney failure, unspecified: Secondary | ICD-10-CM | POA: Diagnosis not present

## 2020-10-15 LAB — BASIC METABOLIC PANEL
Anion gap: 7 (ref 5–15)
BUN: 145 mg/dL — ABNORMAL HIGH (ref 8–23)
CO2: 28 mmol/L (ref 22–32)
Calcium: 8.5 mg/dL — ABNORMAL LOW (ref 8.9–10.3)
Chloride: 116 mmol/L — ABNORMAL HIGH (ref 98–111)
Creatinine, Ser: 2.73 mg/dL — ABNORMAL HIGH (ref 0.61–1.24)
GFR, Estimated: 23 mL/min — ABNORMAL LOW (ref >60)
Glucose, Bld: 149 mg/dL — ABNORMAL HIGH (ref 70–99)
Potassium: 4.6 mmol/L (ref 3.5–5.1)
Sodium: 151 mmol/L — ABNORMAL HIGH (ref 135–145)

## 2020-10-15 LAB — COOXEMETRY PANEL
Carboxyhemoglobin: 1.1 % (ref 0.5–1.5)
Methemoglobin: 0.8 % (ref 0.0–1.5)
O2 Saturation: 79.3 %
Total hemoglobin: 7.9 g/dL — ABNORMAL LOW (ref 12.0–16.0)

## 2020-10-15 LAB — PREPARE RBC (CROSSMATCH)

## 2020-10-15 LAB — GLUCOSE, CAPILLARY
Glucose-Capillary: 104 mg/dL — ABNORMAL HIGH (ref 70–99)
Glucose-Capillary: 119 mg/dL — ABNORMAL HIGH (ref 70–99)
Glucose-Capillary: 141 mg/dL — ABNORMAL HIGH (ref 70–99)
Glucose-Capillary: 147 mg/dL — ABNORMAL HIGH (ref 70–99)
Glucose-Capillary: 177 mg/dL — ABNORMAL HIGH (ref 70–99)

## 2020-10-15 LAB — CBC
HCT: 24.7 % — ABNORMAL LOW (ref 39.0–52.0)
Hemoglobin: 7.2 g/dL — ABNORMAL LOW (ref 13.0–17.0)
MCH: 27.6 pg (ref 26.0–34.0)
MCHC: 29.1 g/dL — ABNORMAL LOW (ref 30.0–36.0)
MCV: 94.6 fL (ref 80.0–100.0)
Platelets: 353 10*3/uL (ref 150–400)
RBC: 2.61 MIL/uL — ABNORMAL LOW (ref 4.22–5.81)
RDW: 19.1 % — ABNORMAL HIGH (ref 11.5–15.5)
WBC: 11.1 10*3/uL — ABNORMAL HIGH (ref 4.0–10.5)
nRBC: 0.2 % (ref 0.0–0.2)

## 2020-10-15 MED ORDER — VALPROIC ACID 250 MG/5ML PO SOLN
250.0000 mg | Freq: Two times a day (BID) | ORAL | Status: DC
  Administered 2020-10-15 – 2020-10-20 (×11): 250 mg
  Filled 2020-10-15 (×11): qty 5

## 2020-10-15 MED ORDER — FREE WATER
200.0000 mL | Status: DC
  Administered 2020-10-15 – 2020-10-17 (×12): 200 mL

## 2020-10-15 MED ORDER — SODIUM CHLORIDE 0.9% IV SOLUTION: Freq: Once | INTRAVENOUS | Status: AC

## 2020-10-15 NOTE — Progress Notes (Signed)
Patient ID: Jack Vance, male   DOB: [birth year], 80 y.o.   MRN: [id number]    Advanced Heart Failure Rounding Note   Subjective:    Underwent emergent CABG on 5/6 for critical LM disease (LIMA -> LAD, SVG -> OM, SVG -> RCA) 5/6 Cultures negative.  5/8 Possible aspiration. A fib --> started amio drip.  Given 2u PRBCs. Hypotensive in the evening and started on vasopressin + antibiotics.  5/9 IABP removed 5/10 NG placed >1.5 out  5/11 started neostigmime 5/12 Febrile 102 5/17 Failed SBT. Underwent bronchoscopy for attempted therapeutic aspiration for mucus plugging. ETT exchanged.  5/19 Hgb 6.1 given 1UPRBC 5/21  Failed SBT  5/23 Trach.   Remains on vent via trach. Unable to wean.   Confuse/agitated - in restraints. Will follow some commands.   Remains on d5 at 69  FW added yesterday and given 1L LR.   SNa remains 151. Creatinine continues to climb. Has been off lasix for several days.   Objective:   Weight Range:  Vital Signs:   Temp:  [98.4 F (36.9 C)-98.6 F (37 C)] 98.6 F (37 C) (05/28 0800) Pulse Rate:  [73-101] 73 (05/28 0800) Resp:  [15-28] 18 (05/28 0800) BP: (93-161)/(52-79) 128/63 (05/28 0800) SpO2:  [97 %-100 %] 100 % (05/28 0800) FiO2 (%):  [40 %] 40 % (05/28 0742) Last BM Date: 10/14/20  Weight change: Filed Weights   10/11/20 0500 10/13/20 0339 10/14/20 0419  Weight: 80.4 kg 80 kg 80 kg    Intake/Output:   Intake/Output Summary (Last 24 hours) at 10/15/2020 0946 Last data filed at 10/15/2020 0500 Gross per 24 hour  Intake 1750 ml  Output 1395 ml  Net 355 ml    PHYSICAL EXAM: General:  Chronically ill appearing. No resp difficulty Agitated but will follow some commands HEENT: normal Neck: supple. + trach Carotids 2+ bilat; no bruits. No lymphadenopathy or thryomegaly appreciated. Cor: PMI nondisplaced. Regular rate & rhythm. No rubs, gallops or murmurs. Lungs: clear Abdomen: soft, nontender, nondistended. No hepatosplenomegaly. No bruits or  masses. Good bowel sounds. + hernia Extremities: no cyanosis, clubbing, rash, edema in restraints  Neuro: as abiie   Telemetry: Sinus 70s Personally reviewed  Labs: Basic Metabolic Panel: Recent Labs  Lab 10/12/20 0344 10/13/20 0316 10/14/20 0456 10/14/20 1301 10/15/20 0345  NA 143 146* 151* 151* 151*  K 3.9 3.8 4.8 4.8 4.6  CL 107 109 115* 114* 116*  CO2 _0 GLUCOSE 145* 155* 112* 123* 149*  BUN 93* 101* 126* 135* 145*  CREATININE 1.98* 2.02* 2.43* 2.62* 2.73*  CALCIUM 8.5* 8.5* 8.5* 8.4* 8.5*    Liver Function Tests: No results for input(s): AST, ALT, ALKPHOS, BILITOT, PROT, ALBUMIN in the last 168 hours. No results for input(s): LIPASE, AMYLASE in the last 168 hours. No results for input(s): AMMONIA in the last 168 hours.  CBC: Recent Labs  Lab 10/11/20 0335 10/12/20 0344 10/13/20 0316 10/14/20 0456 10/15/20 0345  WBC 9.7 11.7* 10.7* 10.8* 11.1*  HGB 8.0* 8.7* 8.2* 7.8* 7.2*  HCT 25.8* 27.8* 26.7* 26.6* 24.7*  MCV 89.6 89.7 90.5 92.7 94.6  PLT 250 323 325 348 353    Cardiac Enzymes: No results for input(s): CKTOTAL, CKMB, CKMBINDEX, TROPONINI in the last 168 hours.  BNP: BNP (last 3 results) Recent Labs    09/23/20 0311  BNP 1,191.8*    ProBNP (last 3 results) No results for input(s): PROBNP in the last 8760 hours.    Other  results:  Imaging: No results found.   Medications:     Scheduled Medications: . amiodarone  200 mg Per Tube Daily  . aspirin  324 mg Per Tube Daily  . atorvastatin  80 mg Per Tube Daily  . chlorhexidine gluconate (MEDLINE KIT)  15 mL Mouth Rinse BID  . Chlorhexidine Gluconate Cloth  6 each Topical Daily  . clonazePAM  0.5 mg Per Tube BID  . clopidogrel  75 mg Per Tube Daily  . feeding supplement (PIVOT 1.5 CAL)  1,000 mL Per Tube Q24H  . feeding supplement (PROSource TF)  45 mL Per Tube BID  . folic acid  1 mg Per Tube Daily  . free water  200 mL Per Tube Q4H  . heparin injection (subcutaneous)   5,000 Units Subcutaneous Q8H  . hydrALAZINE  37.5 mg Per Tube Q8H  . insulin aspart  0-24 Units Subcutaneous Q4H  . insulin aspart  6 Units Subcutaneous Q4H  . insulin glargine  5 Units Subcutaneous BID  . isosorbide dinitrate  10 mg Per Tube TID  . mouth rinse  15 mL Mouth Rinse 10 times per day  . pantoprazole sodium  40 mg Per Tube Daily  . polyethylene glycol  17 g Per Tube Daily  . QUEtiapine  100 mg Per Tube BID  . rOPINIRole  0.5 mg Per Tube QHS  . sodium chloride flush  10-40 mL Intracatheter Q12H  . sodium chloride flush  3 mL Intravenous Q12H  . thiamine  100 mg Per Tube Daily    Infusions: . dextrose Stopped (10/11/20 0724)  . lactated ringers Stopped (10/01/20 0607)    PRN Medications: acetaminophen (TYLENOL) oral liquid 160 mg/5 mL, albuterol, fentaNYL (SUBLIMAZE) injection, Gerhardt's butt cream, lip balm, metoprolol tartrate, ondansetron (ZOFRAN) IV, oxyCODONE, sodium chloride flush, sodium chloride flush, sorbitol   Assessment/Plan:   1. 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  - Co-ox 79%. - Holding diuretics w/ AKI. Wt stable. Will increase IVF  - No ACE/ARB/ARNI/MRA with AKI. No b-blocker yet with shock.  - c/w hydral/nitrates for afterload reduction BP ok   2. NSTEMI with critical CAD - 95% LM and high grade ostial RCA - CABG on 5/6 for critical LM disease (LIMA -> LAD, SVG -> OM, SVG -> RCA) - Continue ASA + Plavix   - Continue statin.   3. Acute hypoxic respiratory failure due to pulmonary edema - on vent through trach  - Possible aspiration 5/8 & 5/10. Antibiotics completed 5/16 - Failing vent wean due to agitation and weakness. CCM following   4. AKI / Uremia  - due to ATN - SCr climbing again. Na up. Unclear exactly why he is getting worse. Suspect he is dry - Continue free  water. Increase D5 - hold all diuretics - still making urine  - If BUN/CR continue to climb May need renal to see again to help with clearance. D/w Dr. Icard   5. Ventral Hernia  - 5/10 NG placed to decompress   6. ID - Finished zosyn for possible aspiration 5/16.  - Blood CX 5/6- NGTD, UC 5/13 NGTD  - Afebrile  7. Anemia - Given 1 unit PRBC 5/19. - Hgb 7.2  , transfuse < 7.5.  - transfuse 1u RBCs  8. Hypernatremia  - Na 151 today, -  Continue FW. Increase   D5 to 100  9. Atrial fibrillation - In NSR  - Continue amio 200 mg daily.   10. Acute encephalopathy - rmains agitated. wil restart VPA - likely component of uremia  11. Dispo - Eventual LTAC  CRITICAL CARE Performed by: ,   Total critical care time: 45 minutes  Critical care time was exclusive of separately billable procedures and treating other patients.  Critical care was necessary to treat or prevent imminent or life-threatening deterioration.  Critical care was time spent personally by me (independent of midlevel providers or residents) on the following activities: development of treatment plan with patient and/or surrogate as well as nursing, discussions with consultants, evaluation of patient's response to treatment, examination of patient, obtaining history from patient or surrogate, ordering and performing treatments and interventions, ordering and review of laboratory studies, ordering and review of radiographic studies, pulse oximetry and re-evaluation of patient's condition.    Length of Stay: 22    , MD  9:46 AM           

## 2020-10-15 NOTE — Progress Notes (Signed)
22 Days Post-Op Procedure(s) (LRB): CORONARY ARTERY BYPASS GRAFTING (CABG)  TIMES 4 USING LEFT GREATER SAPHENOUS VEIN HARVESTED ENDOSCOPICALLY AND LEFT INTERNAL MAMMARY ARTERY (N/A) TRANSESOPHAGEAL ECHOCARDIOGRAM (TEE) (N/A) Subjective: Awake and moving spontaneously  Objective: Vital signs in last 24 hours: Temp:  [98.4 F (36.9 C)-98.6 F (37 C)] 98.4 F (36.9 C) (05/27 1500) Pulse Rate:  [94-101] 94 (05/27 1503) Cardiac Rhythm: Heart block (05/27 2000) Resp:  [15-28] 22 (05/28 0600) BP: (93-164)/(52-79) 129/63 (05/28 0600) SpO2:  [96 %-100 %] 100 % (05/28 0600) FiO2 (%):  [40 %] 40 % (05/28 0353)  Hemodynamic parameters for last 24 hours:    Intake/Output from previous day: 05/27 0701 - 05/28 0700 In: 1900 [P.O.:240; NG/GT:1660] Out: 1545 [Urine:1500; Stool:45] Intake/Output this shift: No intake/output data recorded.  General appearance: no distress Neurologic: moving all 4, not following commands Heart: regular rate and rhythm Lungs: wheezes faint bilateral Abdomen: softy  Lab Results: Recent Labs    10/14/20 0456 10/15/20 0345  WBC 10.8* 11.1*  HGB 7.8* 7.2*  HCT 26.6* 24.7*  PLT 348 353   BMET:  Recent Labs    10/14/20 1301 10/15/20 0345  NA 151* 151*  K 4.8 4.6  CL 114* 116*  CO2 28 28  GLUCOSE 123* 149*  BUN 135* 145*  CREATININE 2.62* 2.73*  CALCIUM 8.4* 8.5*    PT/INR: No results for input(s): LABPROT, INR in the last 72 hours. ABG    Component Value Date/Time   PHART 7.410 09/26/2020 0539   HCO3 19.9 (L) 09/26/2020 0539   TCO2 21 (L) 09/26/2020 0539   ACIDBASEDEF 4.0 (H) 09/26/2020 0539   O2SAT 79.3 10/15/2020 0345   CBG (last 3)  Recent Labs    10/14/20 2028 10/14/20 2335 10/15/20 0335  GLUCAP 123* 83 147*    Assessment/Plan: S/P Procedure(s) (LRB): CORONARY ARTERY BYPASS GRAFTING (CABG)  TIMES 4 USING LEFT GREATER SAPHENOUS VEIN HARVESTED ENDOSCOPICALLY AND LEFT INTERNAL MAMMARY ARTERY (N/A) TRANSESOPHAGEAL ECHOCARDIOGRAM  (TEE) (N/A) -Creatinine up slightly again today Hypernatremia persists- increase free water Mental status c/w delirium- uremic, hypernatremic, prolong hospitalization, no focal deficit Anemia stable   LOS: 22 days    Jack Vance 10/15/2020

## 2020-10-15 NOTE — Plan of Care (Signed)
  Problem: Education: Goal: Will demonstrate proper wound care and an understanding of methods to prevent future damage Outcome: Progressing Goal: Knowledge of disease or condition will improve Outcome: Progressing Goal: Knowledge of the prescribed therapeutic regimen will improve Outcome: Progressing Goal: Individualized Educational Video(s) Outcome: Progressing   Problem: Cardiac: Goal: Will achieve and/or maintain hemodynamic stability Outcome: Progressing   Problem: Respiratory: Goal: Respiratory status will improve Outcome: Progressing   Problem: Urinary Elimination: Goal: Ability to achieve and maintain adequate renal perfusion and functioning will improve Outcome: Progressing   Problem: Skin Integrity: Goal: Wound healing without signs and symptoms of infection Outcome: Progressing Goal: Risk for impaired skin integrity will decrease Outcome: Progressing

## 2020-10-15 NOTE — Progress Notes (Signed)
      [address]       [place] [address]             [phone]      Sleeping  BP (!) 144/68   Pulse 83   Temp 98.3 F (36.8 C) (Oral)   Resp 19   Ht 5\' 6"  (1.676 m)   Wt 80 kg   SpO2 98%   BMI 28.47 kg/m   Intake/Output Summary (Last 24 hours) at 10/15/2020 1802 Last data filed at 10/15/2020 1700 Gross per 24 hour  Intake 2680 ml  Output 1810 ml  Net 870 ml   Transfused 1 unit PRBC  Mysty Kielty C. 10/17/2020, MD [facility] [phone]

## 2020-10-15 NOTE — Progress Notes (Signed)
NAME:  Jack Vance, MRN:  [id number], DOB:  25-Oct-1940, LOS: 22 ADMISSION DATE:  09/23/2020, CONSULTATION DATE:  09/24/2019 REFERRING MD:  Jacinto Halim, CHIEF COMPLAINT:  Acute coronary syndrome   History of Present Illness:  80 year old man who presented with cardiogenic shock secondary to NSTEMI.  Presented with new onset RSCP and severe dyspnea. Found to have acute pulmonary edema and heart failure; intubated and sent for urgent LHC which revealed 3V CAD.  IABP inserted and patient underwent urgent CABG. IABP removed 5/9. Continues to require full mechanical ventilation support.  Pertinent  Medical History   Past Medical History:  Diagnosis Date  . Arthritis   . Asthma   . DDD (degenerative disc disease), lumbar   . Hernia of abdominal wall   . RLS (restless legs syndrome)    Significant Hospital Events: Including procedures, antibiotic start and stop dates in addition to other pertinent events    . 5/6 CABG with Dr Cliffton Asters with LIMA to LAD, SVG to RCA d and SVG to ramus. LV normal and returned to ICU with no vasopressor support.  . No post-operative bleeding but poor RV performance on hemodynamics.  . 5/8 significant hemodynamic instability with labile blood pressure and vasopressor requirements yesterday.  Episode of emesis but no evidence of bowel obstruction.  Evidence of pericardial tamponade. . 5/9 improved hemodynamic, able now to tolerate balloon pump weaning and removal. . 5/11 Stable hemodynamics, continuing diuresis. Improved UOP. TPN consult for initiation 5/12 in the setting of ileus, prolonged NPO status.  . 5/13 sedated, diuresis, milrinone, PICC line today, CVL removed  . 5/14 tolerated short SBT/SAT on Precedex . 5/16 Not tolerating SBT due to significant tachypnea to 50s, placed back on PRVC. Marland Kitchen 5/17 Improved SBT toleration, weaning milrinone, minimizing sedation. Bronched w/ c/f mucus plugging, airways clear, ETT exchanged due to significant buildup. Diuresis  held. . 5/18 More agitated overnight requiring increased sedation with addition of Fentanyl/Versed, Seroquel started. BUN continues to rise. Nephro consult.   5/19 Continues to be agitated overnight requiring increased sedation with fentanyl/versed. Started Klonopin, Seroquel and Requip yesterday. Started on valproate and seroquel increased.  5/20 Patient more interactive following commands and maintaining eyes open and tracking.  5/21 failing SBT's.  Plan for trach next week  5/23 Trach (BI/DS)  5/25 NGT removed, cortrack in place for TF   5/26 pressure support trial  5/27 worsening kideny function   Interim History / Subjective:   Remains on mechanical vent support.  Rising serum creatinine.  He is however making urine.  Objective   Blood pressure 128/63, pulse 73, temperature 98.6 F (37 C), temperature source Oral, resp. rate 18, height 5\' 6"  (1.676 m), weight 80 kg, SpO2 100 %.    Vent Mode: PSV;CPAP FiO2 (%):  [40 %] 40 % Set Rate:  [22 bmp] 22 bmp Vt Set:  [560 mL] 560 mL PEEP:  [5 cmH20] 5 cmH20 Pressure Support:  [5 cmH20] 5 cmH20 Plateau Pressure:  [26 cmH20-29 cmH20] 29 cmH20   Intake/Output Summary (Last 24 hours) at 10/15/2020 0847 Last data filed at 10/15/2020 0500 Gross per 24 hour  Intake 1870 ml  Output 1395 ml  Net 475 ml   Filed Weights   10/11/20 0500 10/13/20 0339 10/14/20 0419  Weight: 80.4 kg 80 kg 80 kg   Physical Examination: BP 128/63 (BP Location: Right Arm)   Pulse 73   Temp 98.6 F (37 C) (Oral)   Resp 18   Ht 5\' 6"  (1.676 m)  Wt 80 kg   SpO2 100%   BMI 28.47 kg/m    Gen:   Elderly male, tracheostomy tube in place on mechanical vent HEENT: No significant secretions except did have some blood from his suctioning last night Lungs: Bilateral mechanically ventilated breath sounds CV:     Regular rate rhythm, S1-S2 Abd: Large central ventral abdominal hernia soft Ext: No significant edema Skin: No obvious rash Neuro: Alert oriented  following commands  Labs/imaging that I have personally reviewed: (right click and "Reselect all SmartList Selections" daily)   Sodium 151  Potassium: 4.6 Serum creatinine 2.73 White blood cell count 11.1 Hemoglobin 7.2  Resolved Hospital Problem List   NSTEMI, Thrombocytopenia  Assessment & Plan:   Acute hypoxemic hypercapnic respiratory failure requiring mechanical ventilation. Prolonged vent weaning due to NM weakness. Possible aspiration Plan: Continue routine trach care Needs to remain on vent at this time. Kidney function has continued to worsen andLittle bit more tachypneic. Continue pressure support ventilation if possible. Am not sure that he will tolerate TCT today He did have some bleeding from his trach suctioning.  I believe it is from aggressive suctioning I would hold off doing too much.  Critically ill due to RV dysfunction following ACS and cardiopulmonary bypass  Cardiogenic shock NSTEMI with culprit RCA involvement Coronary artery disease Plan: Continue amiodarone, Lasix, hydralazine, isosorbide Holding Entresto due to elevated serum creatinine  Acute kidney injury Uremia Hypernatremia Plan: Continue to follow urine output and kidney function No acute indication for dialysis Hopefully this will level off. He is still having good urine output  Gastric ileus, improved Plan: Continue tube feeds  Anemia (Baseline ~10) Plan: Observe His hemoglobin is slowly trending down but he has no overt signs of acute bleeding.  Agitated delirium controlled H/o alcohol abuse Plan: C continue Seroquel, Requip nightly, twice daily Klonopin, valproic acid  RLS Plan: Requip nightly  DM Plan: Lantus plus SSI  Mobility, Deconditioning  Plan:  PT plus OT Would like to see him up out of bed in chair again if possible.   Best Practice: (right click and "Reselect all SmartList Selections" daily)  Diet:  Tube Feed   Pain/Anxiety/Delirium protocol (if  indicated): Yes (RASS goal -1) VAP protocol (if indicated): Yes DVT prophylaxis: Subcutaneous Heparin GI prophylaxis: PPI Glucose control:  SSI Yes and Basal insulin Yes, Central venous access:  Yes, and it is still needed Arterial line:  N/A Foley:  Yes, and it is still needed Mobility:  bed rest  PT consulted: N/A Last date of multidisciplinary goals of care discussion per primary team  Code Status:  full code Disposition: worsening kidney function. Holding on transfer    Josephine Igo, DO  Pulmonary Critical Care 10/15/2020 8:47 AM

## 2020-10-16 DIAGNOSIS — I214 Non-ST elevation (NSTEMI) myocardial infarction: Secondary | ICD-10-CM | POA: Diagnosis not present

## 2020-10-16 DIAGNOSIS — Z9911 Dependence on respirator [ventilator] status: Secondary | ICD-10-CM | POA: Diagnosis not present

## 2020-10-16 DIAGNOSIS — Z930 Tracheostomy status: Secondary | ICD-10-CM

## 2020-10-16 DIAGNOSIS — N179 Acute kidney failure, unspecified: Secondary | ICD-10-CM | POA: Diagnosis not present

## 2020-10-16 DIAGNOSIS — Z951 Presence of aortocoronary bypass graft: Secondary | ICD-10-CM | POA: Diagnosis not present

## 2020-10-16 DIAGNOSIS — I5021 Acute systolic (congestive) heart failure: Secondary | ICD-10-CM | POA: Diagnosis not present

## 2020-10-16 LAB — CBC
HCT: 27.5 % — ABNORMAL LOW (ref 39.0–52.0)
Hemoglobin: 8.2 g/dL — ABNORMAL LOW (ref 13.0–17.0)
MCH: 27.5 pg (ref 26.0–34.0)
MCHC: 29.8 g/dL — ABNORMAL LOW (ref 30.0–36.0)
MCV: 92.3 fL (ref 80.0–100.0)
Platelets: 345 10*3/uL (ref 150–400)
RBC: 2.98 MIL/uL — ABNORMAL LOW (ref 4.22–5.81)
RDW: 19.5 % — ABNORMAL HIGH (ref 11.5–15.5)
WBC: 15.1 10*3/uL — ABNORMAL HIGH (ref 4.0–10.5)
nRBC: 0 % (ref 0.0–0.2)

## 2020-10-16 LAB — BPAM RBC
Blood Product Expiration Date: 202206142359
ISSUE DATE / TIME: 202205281249
Unit Type and Rh: 6200

## 2020-10-16 LAB — TYPE AND SCREEN
ABO/RH(D): A POS
Antibody Screen: NEGATIVE
Unit division: 0

## 2020-10-16 LAB — COOXEMETRY PANEL
Carboxyhemoglobin: 1.2 % (ref 0.5–1.5)
Methemoglobin: 0.8 % (ref 0.0–1.5)
O2 Saturation: 78.9 %
Total hemoglobin: 8.7 g/dL — ABNORMAL LOW (ref 12.0–16.0)

## 2020-10-16 LAB — BASIC METABOLIC PANEL
Anion gap: 8 (ref 5–15)
BUN: 144 mg/dL — ABNORMAL HIGH (ref 8–23)
CO2: 27 mmol/L (ref 22–32)
Calcium: 8.1 mg/dL — ABNORMAL LOW (ref 8.9–10.3)
Chloride: 110 mmol/L (ref 98–111)
Creatinine, Ser: 2.8 mg/dL — ABNORMAL HIGH (ref 0.61–1.24)
GFR, Estimated: 22 mL/min — ABNORMAL LOW (ref >60)
Glucose, Bld: 185 mg/dL — ABNORMAL HIGH (ref 70–99)
Potassium: 4.5 mmol/L (ref 3.5–5.1)
Sodium: 145 mmol/L (ref 135–145)

## 2020-10-16 LAB — GLUCOSE, CAPILLARY
Glucose-Capillary: 100 mg/dL — ABNORMAL HIGH (ref 70–99)
Glucose-Capillary: 158 mg/dL — ABNORMAL HIGH (ref 70–99)
Glucose-Capillary: 126 mg/dL — ABNORMAL HIGH (ref 70–99)
Glucose-Capillary: 124 mg/dL — ABNORMAL HIGH (ref 70–99)
Glucose-Capillary: 106 mg/dL — ABNORMAL HIGH (ref 70–99)
Glucose-Capillary: 106 mg/dL — ABNORMAL HIGH (ref 70–99)
Glucose-Capillary: 147 mg/dL — ABNORMAL HIGH (ref 70–99)
Glucose-Capillary: 107 mg/dL — ABNORMAL HIGH (ref 70–99)

## 2020-10-16 MED ORDER — QUETIAPINE FUMARATE 50 MG PO TABS
50.0000 mg | ORAL_TABLET | Freq: Two times a day (BID) | ORAL | Status: DC
  Administered 2020-10-16 – 2020-10-20 (×8): 50 mg
  Filled 2020-10-16 (×8): qty 1

## 2020-10-16 NOTE — Progress Notes (Signed)
      [address]       [place] [address]             [phone]        Sleeping  BP 136/64   Pulse 75   Temp 98.3 F (36.8 C) (Oral)   Resp 18   Ht 5\' 6"  (1.676 m)   Wt 80 kg   SpO2 97%   BMI 28.47 kg/m   Intake/Output Summary (Last 24 hours) at 10/16/2020 1819 Last data filed at 10/16/2020 1512 Gross per 24 hour  Intake 2920 ml  Output 1700 ml  Net 1220 ml   Continue current care  Myer Bohlman C. 10/18/2020, MD [facility] [phone]

## 2020-10-16 NOTE — Progress Notes (Signed)
23 Days Post-Op Procedure(s) (LRB): CORONARY ARTERY BYPASS GRAFTING (CABG)  TIMES 4 USING LEFT GREATER SAPHENOUS VEIN HARVESTED ENDOSCOPICALLY AND LEFT INTERNAL MAMMARY ARTERY (N/A) TRANSESOPHAGEAL ECHOCARDIOGRAM (TEE) (N/A) Subjective: Alert, following simple commands  Objective: Vital signs in last 24 hours: Temp:  [97.1 F (36.2 C)-98.6 F (37 C)] 98.1 F (36.7 C) (05/29 0739) Pulse Rate:  [73-90] 90 (05/29 0743) Cardiac Rhythm: Normal sinus rhythm;Sinus tachycardia (05/28 2000) Resp:  [13-26] 13 (05/29 0743) BP: (117-158)/(54-86) 158/71 (05/29 0700) SpO2:  [96 %-100 %] 98 % (05/29 0743) FiO2 (%):  [30 %-40 %] 30 % (05/29 0743)  Hemodynamic parameters for last 24 hours:    Intake/Output from previous day: 05/28 0701 - 05/29 0700 In: 3445 [I.V.:50; Blood:315; NG/GT:3080] Out: 1150 [Urine:1150] Intake/Output this shift: No intake/output data recorded.  General appearance: alert, cooperative and no distress Neurologic: nonfocal Heart: regular rate and rhythm Lungs: rhonchi bilaterally Abdomen: soft, nontender Wound: clean and dry  Lab Results: Recent Labs    10/15/20 0345 10/16/20 0312  WBC 11.1* 15.1*  HGB 7.2* 8.2*  HCT 24.7* 27.5*  PLT 353 345   BMET:  Recent Labs    10/15/20 0345 10/16/20 0312  NA 151* 145  K 4.6 4.5  CL 116* 110  CO2 28 27  GLUCOSE 149* 185*  BUN 145* 144*  CREATININE 2.73* 2.80*  CALCIUM 8.5* 8.1*    PT/INR: No results for input(s): LABPROT, INR in the last 72 hours. ABG    Component Value Date/Time   PHART 7.410 09/26/2020 0539   HCO3 19.9 (L) 09/26/2020 0539   TCO2 21 (L) 09/26/2020 0539   ACIDBASEDEF 4.0 (H) 09/26/2020 0539   O2SAT 78.9 10/16/2020 0312   CBG (last 3)  Recent Labs    10/15/20 2356 10/16/20 0308 10/16/20 0737  GLUCAP 104* 158* 126*    Assessment/Plan: S/P Procedure(s) (LRB): CORONARY ARTERY BYPASS GRAFTING (CABG)  TIMES 4 USING LEFT GREATER SAPHENOUS VEIN HARVESTED ENDOSCOPICALLY AND LEFT  INTERNAL MAMMARY ARTERY (N/A) TRANSESOPHAGEAL ECHOCARDIOGRAM (TEE) (N/A) -  NEURO- more alert and following commands this AM CV- co-ox 78 RESP- VDRF, prolonged vent wean, on PS ventilation, 30% FiO2 RENAL- hypernatremia improved  Creatinine up slightly to 2.8, BUN stable ENDO- CBG well controlled GI- tolerating goal TF Anemia improved post transfusion   LOS: 23 days    Jack Vance 10/16/2020

## 2020-10-16 NOTE — Progress Notes (Addendum)
Patient ID: Jack Vance, male   DOB: [birth year], 80 y.o.   MRN: [id number]    Advanced Heart Failure Rounding Note   Subjective:    Underwent emergent CABG on 5/6 for critical LM disease (LIMA -> LAD, SVG -> OM, SVG -> RCA) 5/6 Cultures negative.  5/8 Possible aspiration. A fib --> started amio drip.  Given 2u PRBCs. Hypotensive in the evening and started on vasopressin + antibiotics.  5/9 IABP removed 5/10 NG placed >1.5 out  5/11 started neostigmime 5/12 Febrile 102 5/17 Failed SBT. Underwent bronchoscopy for attempted therapeutic aspiration for mucus plugging. ETT exchanged.  5/19 Hgb 6.1 given 1UPRBC 5/21  Failed SBT  5/23 Trach.   Remains on vent via trach. Unable to wean.   Confused/agitatied - in restraints. Will follow some commands  Remains on d5 at 100 and FW  SNa 151 -> 145. Scr creatinine seems to be plateauing at 2.8. Off diuretics. Making urine   Objective:   Weight Range:  Vital Signs:   Temp:  [97.1 F (36.2 C)-98.3 F (36.8 C)] 98.1 F (36.7 C) (05/29 0739) Pulse Rate:  [77-90] 90 (05/29 0743) Resp:  [13-29] 21 (05/29 0858) BP: (117-158)/(54-86) 142/59 (05/29 0858) SpO2:  [96 %-100 %] 97 % (05/29 0858) FiO2 (%):  [30 %-40 %] 40 % (05/29 0858) Last BM Date: 10/15/20  Weight change: Filed Weights   10/11/20 0500 10/13/20 0339 10/14/20 0419  Weight: 80.4 kg 80 kg 80 kg    Intake/Output:   Intake/Output Summary (Last 24 hours) at 10/16/2020 0935 Last data filed at 10/16/2020 0800 Gross per 24 hour  Intake 3705 ml  Output 1450 ml  Net 2255 ml    PHYSICAL EXAM: General:  Chronically ill appearing. No resp difficulty Agitated but will follow some commands HEENT: normal Neck: supple. JVP 7  + trach  Carotids 2+ bilat; no bruits. No lymphadenopathy or thryomegaly appreciated. Cor: PMI nondisplaced. Regular rate & rhythm. No rubs, gallops or murmurs. Lungs: clear Abdomen: soft, nontender, nondistended. No hepatosplenomegaly. No bruits or masses.  Good bowel sounds. + large hernia Extremities: no cyanosis, clubbing, rash, edema + boots Neuro: agitated. Will follow some commands    Telemetry: Sinus 70s Personally reviewed  Labs: Basic Metabolic Panel: Recent Labs  Lab 10/13/20 0316 10/14/20 0456 10/14/20 1301 10/15/20 0345 10/16/20 0312  NA 146* 151* 151* 151* 145  K 3.8 4.8 4.8 4.6 4.5  CL 109 115* 114* 116* 110  CO2 _0 GLUCOSE 155* 112* 123* 149* 185*  BUN 101* 126* 135* 145* 144*  CREATININE 2.02* 2.43* 2.62* 2.73* 2.80*  CALCIUM 8.5* 8.5* 8.4* 8.5* 8.1*    Liver Function Tests: No results for input(s): AST, ALT, ALKPHOS, BILITOT, PROT, ALBUMIN in the last 168 hours. No results for input(s): LIPASE, AMYLASE in the last 168 hours. No results for input(s): AMMONIA in the last 168 hours.  CBC: Recent Labs  Lab 10/12/20 0344 10/13/20 0316 10/14/20 0456 10/15/20 0345 10/16/20 0312  WBC 11.7* 10.7* 10.8* 11.1* 15.1*  HGB 8.7* 8.2* 7.8* 7.2* 8.2*  HCT 27.8* 26.7* 26.6* 24.7* 27.5*  MCV 89.7 90.5 92.7 94.6 92.3  PLT 323 325 348 353 345    Cardiac Enzymes: No results for input(s): CKTOTAL, CKMB, CKMBINDEX, TROPONINI in the last 168 hours.  BNP: BNP (last 3 results) Recent Labs    09/23/20 0311  BNP 1,191.8*    ProBNP (last 3 results) No results for input(s): PROBNP in the last 8760 hours.  Other results:  Imaging: No results found.   Medications:     Scheduled Medications: . amiodarone  200 mg Per Tube Daily  . aspirin  324 mg Per Tube Daily  . atorvastatin  80 mg Per Tube Daily  . chlorhexidine gluconate (MEDLINE KIT)  15 mL Mouth Rinse BID  . Chlorhexidine Gluconate Cloth  6 each Topical Daily  . clonazePAM  0.5 mg Per Tube BID  . clopidogrel  75 mg Per Tube Daily  . feeding supplement (PIVOT 1.5 CAL)  1,000 mL Per Tube Q24H  . feeding supplement (PROSource TF)  45 mL Per Tube BID  . folic acid  1 mg Per Tube Daily  . free water  200 mL Per Tube Q4H  . heparin  injection (subcutaneous)  5,000 Units Subcutaneous Q8H  . hydrALAZINE  37.5 mg Per Tube Q8H  . insulin aspart  0-24 Units Subcutaneous Q4H  . insulin aspart  6 Units Subcutaneous Q4H  . insulin glargine  5 Units Subcutaneous BID  . isosorbide dinitrate  10 mg Per Tube TID  . mouth rinse  15 mL Mouth Rinse 10 times per day  . pantoprazole sodium  40 mg Per Tube Daily  . polyethylene glycol  17 g Per Tube Daily  . QUEtiapine  100 mg Per Tube BID  . rOPINIRole  0.5 mg Per Tube QHS  . thiamine  100 mg Per Tube Daily  . valproic acid  250 mg Per Tube BID    Infusions: . dextrose 100 mL/hr at 10/15/20 2046  . lactated ringers Stopped (10/01/20 0607)    PRN Medications: acetaminophen (TYLENOL) oral liquid 160 mg/5 mL, albuterol, fentaNYL (SUBLIMAZE) injection, Gerhardt's butt cream, lip balm, metoprolol tartrate, ondansetron (ZOFRAN) IV, oxyCODONE, sodium chloride flush, sodium chloride flush, sorbitol   Assessment/Plan:   1. 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  - Co-ox 79%  - Holding diuretics w/ AKI. On IVF for hypernatremia. No weights charted - No ACE/ARB/ARNI/MRA with AKI. No b-blocker yet with shock.  - c/w hydral/nitrates for afterload reduction BP ok   2. NSTEMI with critical CAD - 95% LM and high grade ostial RCA - CABG on 5/6 for critical LM disease (LIMA -> LAD, SVG -> OM, SVG -> RCA) - Continue ASA + Plavix   - Continue statin.  - No s/s angina   3. Acute hypoxic respiratory failure due to pulmonary edema - on vent through trach  - Possible aspiration 5/8 & 5/10. Antibiotics completed 5/16 - Failing vent wean due to agitation and weakness. CCM following   4. AKI / Uremia  - due to ATN - SCr now plateauing at 2.8 - hold all diuretics - still making urine   5. Ventral Hernia  - 5/10 NG  placed to decompress   6. ID - Finished zosyn for possible aspiration 5/16.  - Blood CX 5/6- NGTD, UC 5/13 NGTD  - Afebrile  7. Anemia - Given 1 unit PRBC 5/19. - Hgb 7.2  , transfuse < 7.5.  - transfuse 1u RBCs  8. Hypernatremia  - Na 15 -> 145  today, -  Continue FW. and D5 at 100  9. Atrial fibrillation - In NSR  - Continue amio 200 mg daily.   10. Acute encephalopathy - remains agitated. - May have component of uremia  11. Dispo -  Eventual LTAC. Possibly Tuesday if Scr improving and sodium stable   CRITICAL CARE Performed by: Glori Bickers  Total critical care time: 35 minutes  Critical care time was exclusive of separately billable procedures and treating other patients.  Critical care was necessary to treat or prevent imminent or life-threatening deterioration.  Critical care was time spent personally by me (independent of midlevel providers or residents) on the following activities: development of treatment plan with patient and/or surrogate as well as nursing, discussions with consultants, evaluation of patient's response to treatment, examination of patient, obtaining history from patient or surrogate, ordering and performing treatments and interventions, ordering and review of laboratory studies, ordering and review of radiographic studies, pulse oximetry and re-evaluation of patient's condition.    Length of Stay: 65   Glori Bickers, MD  9:35 AM

## 2020-10-16 NOTE — Progress Notes (Signed)
NAME:  HADDEN STEIG, MRN:  [id number], DOB:  20-Apr-1941, LOS: 23 ADMISSION DATE:  09/23/2020, CONSULTATION DATE:  09/24/2019 REFERRING MD:  Jacinto Halim, CHIEF COMPLAINT:  Acute coronary syndrome   History of Present Illness:  80 year old man who presented with cardiogenic shock secondary to NSTEMI.  Presented with new onset RSCP and severe dyspnea. Found to have acute pulmonary edema and heart failure; intubated and sent for urgent LHC which revealed 3V CAD.  IABP inserted and patient underwent urgent CABG. IABP removed 5/9. Continues to require full mechanical ventilation support.  Pertinent  Medical History   Past Medical History:  Diagnosis Date  . Arthritis   . Asthma   . DDD (degenerative disc disease), lumbar   . Hernia of abdominal wall   . RLS (restless legs syndrome)    Significant Hospital Events: Including procedures, antibiotic start and stop dates in addition to other pertinent events    . 5/6 CABG with Dr Cliffton Asters with LIMA to LAD, SVG to RCA d and SVG to ramus. LV normal and returned to ICU with no vasopressor support.  . No post-operative bleeding but poor RV performance on hemodynamics.  . 5/8 significant hemodynamic instability with labile blood pressure and vasopressor requirements yesterday.  Episode of emesis but no evidence of bowel obstruction.  Evidence of pericardial tamponade. . 5/9 improved hemodynamic, able now to tolerate balloon pump weaning and removal. . 5/11 Stable hemodynamics, continuing diuresis. Improved UOP. TPN consult for initiation 5/12 in the setting of ileus, prolonged NPO status.  . 5/13 sedated, diuresis, milrinone, PICC line today, CVL removed  . 5/14 tolerated short SBT/SAT on Precedex . 5/16 Not tolerating SBT due to significant tachypnea to 50s, placed back on PRVC. Marland Kitchen 5/17 Improved SBT toleration, weaning milrinone, minimizing sedation. Bronched w/ c/f mucus plugging, airways clear, ETT exchanged due to significant buildup. Diuresis  held. . 5/18 More agitated overnight requiring increased sedation with addition of Fentanyl/Versed, Seroquel started. BUN continues to rise. Nephro consult.   5/19 Continues to be agitated overnight requiring increased sedation with fentanyl/versed. Started Klonopin, Seroquel and Requip yesterday. Started on valproate and seroquel increased.  5/20 Patient more interactive following commands and maintaining eyes open and tracking.  5/21 failing SBT's.  Plan for trach next week  5/23 Trach (BI/DS)  5/25 NGT removed, cortrack in place for TF   5/26 pressure support trial  5/27 worsening kideny function   5/29 serum creatinine stable  Interim History / Subjective:   Remains on mechanical vent support, serum creatinine stable  Objective   Blood pressure (!) 142/59, pulse 90, temperature 98.1 F (36.7 C), temperature source Oral, resp. rate (!) 21, height 5\' 6"  (1.676 m), weight 80 kg, SpO2 97 %.    Vent Mode: PSV;CPAP FiO2 (%):  [30 %-40 %] 40 % Set Rate:  [22 bmp] 22 bmp Vt Set:  [560 mL] 560 mL PEEP:  [5 cmH20] 5 cmH20 Pressure Support:  [5 cmH20] 5 cmH20 Plateau Pressure:  [24 cmH20-26 cmH20] 26 cmH20   Intake/Output Summary (Last 24 hours) at 10/16/2020 1006 Last data filed at 10/16/2020 0800 Gross per 24 hour  Intake 3705 ml  Output 1450 ml  Net 2255 ml   Filed Weights   10/11/20 0500 10/13/20 0339 10/14/20 0419  Weight: 80.4 kg 80 kg 80 kg   Physical Examination: BP (!) 142/59   Pulse 90   Temp 98.1 F (36.7 C) (Oral)   Resp (!) 21   Ht 5\' 6"  (1.676 m)  Wt 80 kg   SpO2 97%   BMI 28.47 kg/m    Gen:   Elderly male, tracheostomy tube in place on trach collar trial HEENT: Tracheostomy in place no significant secretions Lungs: Bilateral breath sounds no crackles no wheeze CV:    Regular rate rhythm, S1-S2 Abd: Large central ventral abdominal hernia, soft nontender Ext: No edema Skin: No rash Neuro: Alert oriented follows commands moves all 4  extremities  Labs/imaging that I have personally reviewed: (right click and "Reselect all SmartList Selections" daily)   Sodium of  Potassium: 0.6 Serum creatinine 2.8 White blood cell count 15.1 Hemoglobin 8.2  Resolved Hospital Problem List   NSTEMI, Thrombocytopenia  Assessment & Plan:   Acute hypoxemic hypercapnic respiratory failure requiring mechanical ventilation. Prolonged vent weaning due to NM weakness. Possible aspiration Plan: Continue routine trach care On and off vent as needed Will need long-term care Likely need prolonged vent SNF wean.  Critically ill due to RV dysfunction following ACS and cardiopulmonary bypass  Cardiogenic shock NSTEMI with culprit RCA involvement Coronary artery disease Plan: Continue amiodarone, Lasix, hydralazine, isosorbide Holding Entresto due to elevated serum creatinine Appreciate heart failure service input.  Acute kidney injury Uremia Hypernatremia Plan: Follow urine output and kidney function If remains stable suspect can be discharged to LTAC  Gastric ileus, improved Plan: Continue tube feeds  Anemia (Baseline ~10) Plan: Observe Follow hemoglobin, no acute sign of bleeding.  Agitated delirium controlled H/o alcohol abuse Plan: Continue Seroquel, reduced dose Stop clonazepam Continue valproic acid  RLS Plan: Requip nightly  DM Plan: Lantus plus SSI  Mobility, Deconditioning  Plan:  PT OT   Best Practice: (right click and "Reselect all SmartList Selections" daily)  Diet:  Tube Feed   Pain/Anxiety/Delirium protocol (if indicated): Yes (RASS goal -1) VAP protocol (if indicated): Yes DVT prophylaxis: Subcutaneous Heparin GI prophylaxis: PPI Glucose control:  SSI Yes and Basal insulin Yes, Central venous access:  Yes, and it is still needed Arterial line:  N/A Foley:  Yes, and it is still needed Mobility:  bed rest  PT consulted: N/A Last date of multidisciplinary goals of care discussion per  primary team  Code Status:  full code Disposition: Possible stable for placement Monday/Tuesday   Josephine Igo, DO [facility] 10/16/2020 10:06 AM

## 2020-10-16 NOTE — Progress Notes (Signed)
Pt placed on 40% ATC and is tolerating well at this time. RN aware. RT to continue to monitor. 

## 2020-10-17 ENCOUNTER — Inpatient Hospital Stay (HOSPITAL_COMMUNITY): Payer: No Typology Code available for payment source

## 2020-10-17 DIAGNOSIS — J9503 Malfunction of tracheostomy stoma: Secondary | ICD-10-CM | POA: Diagnosis not present

## 2020-10-17 DIAGNOSIS — R0603 Acute respiratory distress: Secondary | ICD-10-CM | POA: Diagnosis not present

## 2020-10-17 DIAGNOSIS — I5082 Biventricular heart failure: Secondary | ICD-10-CM

## 2020-10-17 DIAGNOSIS — I5021 Acute systolic (congestive) heart failure: Secondary | ICD-10-CM | POA: Diagnosis not present

## 2020-10-17 DIAGNOSIS — K567 Ileus, unspecified: Secondary | ICD-10-CM | POA: Diagnosis not present

## 2020-10-17 DIAGNOSIS — N179 Acute kidney failure, unspecified: Secondary | ICD-10-CM | POA: Diagnosis not present

## 2020-10-17 LAB — GLUCOSE, CAPILLARY
Glucose-Capillary: 140 mg/dL — ABNORMAL HIGH (ref 70–99)
Glucose-Capillary: 117 mg/dL — ABNORMAL HIGH (ref 70–99)
Glucose-Capillary: 132 mg/dL — ABNORMAL HIGH (ref 70–99)
Glucose-Capillary: 92 mg/dL (ref 70–99)
Glucose-Capillary: 114 mg/dL — ABNORMAL HIGH (ref 70–99)

## 2020-10-17 LAB — COOXEMETRY PANEL
Carboxyhemoglobin: 1.2 % (ref 0.5–1.5)
Methemoglobin: 0.9 % (ref 0.0–1.5)
O2 Saturation: 77 %
Total hemoglobin: 7.4 g/dL — ABNORMAL LOW (ref 12.0–16.0)

## 2020-10-17 LAB — BASIC METABOLIC PANEL
Anion gap: 10 (ref 5–15)
BUN: 151 mg/dL — ABNORMAL HIGH (ref 8–23)
CO2: 25 mmol/L (ref 22–32)
Calcium: 8.3 mg/dL — ABNORMAL LOW (ref 8.9–10.3)
Chloride: 104 mmol/L (ref 98–111)
Creatinine, Ser: 2.89 mg/dL — ABNORMAL HIGH (ref 0.61–1.24)
GFR, Estimated: 22 mL/min — ABNORMAL LOW (ref >60)
Glucose, Bld: 117 mg/dL — ABNORMAL HIGH (ref 70–99)
Potassium: 4.4 mmol/L (ref 3.5–5.1)
Sodium: 139 mmol/L (ref 135–145)

## 2020-10-17 MED ORDER — FREE WATER: 200.0000 mL | Freq: Four times a day (QID) | Status: DC

## 2020-10-17 MED ORDER — ETOMIDATE 2 MG/ML IV SOLN
INTRAVENOUS | Status: AC
  Administered 2020-10-17: 20 mg via INTRAVENOUS
  Filled 2020-10-17: qty 10

## 2020-10-17 MED ORDER — ETOMIDATE 2 MG/ML IV SOLN: 20.0000 mg | Freq: Once | INTRAVENOUS | Status: AC

## 2020-10-17 NOTE — Procedures (Signed)
Tracheostomy Exchange Procedure Note  NIKOLA MARONE  [id number]  04-Apr-1941  Date:10/17/20  Time:8:46 AM   Provider Performing:Marlan Steward Salena Saner Katrinka Blazing   Procedure: Tracheostomy Exchange Through Immature Stoma (92330)  Indication(s) Malfunctioning trach  Consent Unable to obtain consent due to emergent nature of procedure.  Anesthesia Etomidate 20mg    Time Out Verified patient identification, verified procedure, site/side was marked, verified correct patient position, special equipment/implants available, medications/allergies/relevant history reviewed, required imaging and test results available.   Sterile Technique Hand hygiene, gloves   Procedure Description Size 6 cuffed existing Shiley removed and size 6-0 distal XLT cuffed Shiley placed through stoma.   Complications/Tolerance None; patient tolerated the procedure well..   EBL Minimal

## 2020-10-17 NOTE — Progress Notes (Signed)
Patient has audible cuff leak on trach. Cuff holds air but a leak can still be heard. RT placed patient on CPAP/PSV mode PS 8 and PEEP 5. This has helped with cuff leak and patient is tolerating mode well. No respiratory distress noted. RN is aware. RT will monitor as needed.

## 2020-10-17 NOTE — Plan of Care (Signed)
  Problem: Education: Goal: Knowledge of the prescribed therapeutic regimen will improve Outcome: Progressing   Problem: Cardiac: Goal: Will achieve and/or maintain hemodynamic stability Outcome: Progressing   Problem: Respiratory: Goal: Respiratory status will improve Outcome: Progressing   Problem: Urinary Elimination: Goal: Ability to achieve and maintain adequate renal perfusion and functioning will improve Outcome: Progressing   Problem: Clinical Measurements: Goal: Diagnostic test results will improve Outcome: Progressing   Problem: Clinical Measurements: Goal: Respiratory complications will improve Outcome: Progressing

## 2020-10-17 NOTE — Progress Notes (Signed)
Paged MD as pt with increased work of breathing, flailing out of bed, agitated and nodding yes to pain. However, pt brady down to 44. Pt suctioned, and o2 sats low 90's. MD to bedside to exchange trach. MD order for etomidate, given.  Monitoring closely.  Delories Heinz, RN

## 2020-10-17 NOTE — Progress Notes (Addendum)
NAME:  Jack Vance, MRN:  [id number], DOB:  Apr 25, 1941, LOS: 24 ADMISSION DATE:  09/23/2020, CONSULTATION DATE:  09/24/2019 REFERRING MD:  Jacinto Halim, CHIEF COMPLAINT:  Acute coronary syndrome   History of Present Illness:  80 year old man who presented with cardiogenic shock secondary to NSTEMI.  Presented with new onset RSCP and severe dyspnea. Found to have acute pulmonary edema and heart failure; intubated and sent for urgent LHC which revealed 3V CAD.  IABP inserted and patient underwent urgent CABG. IABP removed 5/9. Continues to require full mechanical ventilation support.  Pertinent  Medical History   Past Medical History:  Diagnosis Date  . Arthritis   . Asthma   . DDD (degenerative disc disease), lumbar   . Hernia of abdominal wall   . RLS (restless legs syndrome)    Significant Hospital Events: Including procedures, antibiotic start and stop dates in addition to other pertinent events    . 5/6 CABG with Dr Cliffton Asters with LIMA to LAD, SVG to RCA d and SVG to ramus. LV normal and returned to ICU with no vasopressor support.  . No post-operative bleeding but poor RV performance on hemodynamics.  . 5/8 significant hemodynamic instability with labile blood pressure and vasopressor requirements yesterday.  Episode of emesis but no evidence of bowel obstruction.  Evidence of pericardial tamponade. . 5/9 improved hemodynamic, able now to tolerate balloon pump weaning and removal. . 5/11 Stable hemodynamics, continuing diuresis. Improved UOP. TPN consult for initiation 5/12 in the setting of ileus, prolonged NPO status.  . 5/13 sedated, diuresis, milrinone, PICC line today, CVL removed  . 5/14 tolerated short SBT/SAT on Precedex . 5/16 Not tolerating SBT due to significant tachypnea to 50s, placed back on PRVC. Marland Kitchen 5/17 Improved SBT toleration, weaning milrinone, minimizing sedation. Bronched w/ c/f mucus plugging, airways clear, ETT exchanged due to significant buildup. Diuresis  held. . 5/18 More agitated overnight requiring increased sedation with addition of Fentanyl/Versed, Seroquel started. BUN continues to rise. Nephro consult.   5/19 Continues to be agitated overnight requiring increased sedation with fentanyl/versed. Started Klonopin, Seroquel and Requip yesterday. Started on valproate and seroquel increased.  5/20 Patient more interactive following commands and maintaining eyes open and tracking.  5/21 failing SBT's.  Plan for trach next week  5/23 Trach (BI/DS)  5/25 NGT removed, cortrack in place for TF   5/26 pressure support trial  5/27 worsening kideny function   5/29 serum creatinine stable  5/30 cuff leak issues  Interim History / Subjective:  Large cuff leak this am, balloon seems to be holding air. Denies pain.  Objective   Blood pressure (!) 128/35, pulse 63, temperature 98.6 F (37 C), temperature source Oral, resp. rate (!) 24, height 5\' 6"  (1.676 m), weight 85.6 kg, SpO2 98 %.    Vent Mode: PRVC FiO2 (%):  [40 %] 40 % Set Rate:  [22 bmp] 22 bmp Vt Set:  [560 mL] 560 mL PEEP:  [5 cmH20] 5 cmH20 Pressure Support:  [8 cmH20] 8 cmH20 Plateau Pressure:  [17 cmH20-19 cmH20] 18 cmH20   Intake/Output Summary (Last 24 hours) at 10/17/2020 0823 Last data filed at 10/17/2020 0700 Gross per 24 hour  Intake 4276.59 ml  Output 1300 ml  Net 2976.59 ml   Filed Weights   10/13/20 0339 10/14/20 0419 10/17/20 0500  Weight: 80 kg 80 kg 85.6 kg   Physical Examination: BP (!) 128/35   Pulse 63   Temp 98.6 F (37 C) (Oral)   Resp (!) 24  Ht 5\' 6"  (1.676 m)   Wt 85.6 kg   SpO2 98%   BMI 30.46 kg/m    Constitutional: ill appearing man on vent  Eyes: EOMI, pupils equal Ears, nose, mouth, and throat: trach in place, minimal secretions Cardiovascular: RRR, +SEM, ext warm Respiratory: rhonci worse on R, mildly tachypneic Gastrointestinal: mildly distended, large ventral hernia noted, +BS Skin: No rashes, normal turgor Neurologic:  moves all 4 ext to command Psychiatric: RASS 0   Labs/imaging that I have personally reviewed: (right click and "Reselect all SmartList Selections" daily)  coox 77% BUN 144>>151 Cr 2.8>>2.9 Net +2.9L CBG okay CXR basilar infiltrates  Resolved Hospital Problem List   NSTEMI, Thrombocytopenia  Assessment & Plan:   Acute hypoxemic hypercapnic respiratory failure requiring mechanical ventilation. Prolonged vent weaning due to NM weakness. Possible aspiration Trach malfunction- suspect its just a bit high - Switch to TC today, if does not tolerate will need to switch out for distal XLT - Trach sutures to be removed  Critically ill due to RV dysfunction following ACS and cardiopulmonary bypass  Cardiogenic shock NSTEMI with culprit RCA involvement Coronary artery disease -Continue amiodarone, hydralazine, isosorbide - DAPT - Holding Entresto due to elevated serum creatinine - Appreciate heart failure service input.  Acute kidney injury Uremia Hypernatremia - DC free water - Renal ordered by CHF team - Monitor, see if we can avoid HD if able  Gastric ileus, improved -Continue tube feeds, sorbitol  Anemia (Baseline ~10) - Monitor daily  Agitated delirium controlled H/o alcohol abuse - Continue seroquel and valproat  RLS -Requip nightly  DM -Lantus plus SSI  Mobility, Deconditioning   -PT OT   Best Practice: (right click and "Reselect all SmartList Selections" daily)  Diet:  Tube Feed   Pain/Anxiety/Delirium protocol (if indicated): Yes (RASS goal 0) VAP protocol (if indicated): Yes DVT prophylaxis: Subcutaneous Heparin GI prophylaxis: PPI Glucose control:  SSI Yes and Basal insulin Yes, Central venous access:  Yes, and it is still needed Arterial line:  N/A Foley:  Yes, and it is still needed Mobility:  bed rest  PT consulted: yes Last date of multidisciplinary goals of care discussion per primary team  Code Status:  full code Disposition: LTACH  when able   Patient critically ill due to respiratory distress Interventions to address this today tracheostomy change, vent adjustments Risk of deterioration without these interventions is high  I personally spent 32 minutes providing critical care not including any separately billable procedures  [place] MD [facility]  Prefer epic messenger for cross cover needs If after hours, please call E-link

## 2020-10-17 NOTE — Progress Notes (Signed)
Patient ID: Jack Vance, male   DOB: [birth year], 80 y.o.   MRN: [id number]    Advanced Heart Failure Rounding Note   Subjective:    Underwent emergent CABG on 5/6 for critical LM disease (LIMA -> LAD, SVG -> OM, SVG -> RCA) 5/6 Cultures negative.  5/8 Possible aspiration. A fib --> started amio drip.  Given 2u PRBCs. Hypotensive in the evening and started on vasopressin + antibiotics.  5/9 IABP removed 5/10 NG placed >1.5 out  5/11 started neostigmime 5/12 Febrile 102 5/17 Failed SBT. Underwent bronchoscopy for attempted therapeutic aspiration for mucus plugging. ETT exchanged.  5/19 Hgb 6.1 given 1UPRBC 5/21  Failed SBT  5/23 Trach.   On vent through trach. Currently on TCT.   Agitated in restraints. Will not follow commands for me this am.    Remains on d5 at 100 and FW. BUN and SCr continues to rise  Bradying down to 40s on TCT    Objective:   Weight Range:  Vital Signs:   Temp:  [97 F (36.1 C)-98.6 F (37 C)] 98.6 F (37 C) (05/30 0347) Pulse Rate:  [63-82] 63 (05/30 0742) Resp:  [16-32] 24 (05/30 0759) BP: (113-160)/(35-88) 128/35 (05/30 0730) SpO2:  [84 %-99 %] 98 % (05/30 0815) FiO2 (%):  [40 %] 40 % (05/30 0815) Weight:  [85.6 kg] 85.6 kg (05/30 0500) Last BM Date: 10/15/20  Weight change: Filed Weights   10/13/20 0339 10/14/20 0419 10/17/20 0500  Weight: 80 kg 80 kg 85.6 kg    Intake/Output:   Intake/Output Summary (Last 24 hours) at 10/17/2020 0841 Last data filed at 10/17/2020 0700 Gross per 24 hour  Intake 4276.59 ml  Output 1300 ml  Net 2976.59 ml    PHYSICAL EXAM: General:  Chronically ill appearing. Awake. Agitated. Not following commands.  HEENT: normal Neck: supple. no JVD. + trach Carotids 2+ bilat; no bruits. No lymphadenopathy or thryomegaly appreciated. Cor: PMI nondisplaced. Regular brady No rubs, gallops or murmurs. Lungs: clear Abdomen: soft, mildly tender, nondistended. No hepatosplenomegaly. No bruits or masses. Good bowel  sounds. Large ventral hernia Extremities: no cyanosis, clubbing, rash, edema Neuro: as above    Telemetry: Sinus 70s Personally reviewed  Labs: Basic Metabolic Panel: Recent Labs  Lab 10/14/20 0456 10/14/20 1301 10/15/20 0345 10/16/20 0312 10/17/20 0530  NA 151* 151* 151* 145 139  K 4.8 4.8 4.6 4.5 4.4  CL 115* 114* 116* 110 104  CO2 $Re'29 28 28 27 25  'UWW$ GLUCOSE 112* 123* 149* 185* 117*  BUN 126* 135* 145* 144* 151*  CREATININE 2.43* 2.62* 2.73* 2.80* 2.89*  CALCIUM 8.5* 8.4* 8.5* 8.1* 8.3*    Liver Function Tests: No results for input(s): AST, ALT, ALKPHOS, BILITOT, PROT, ALBUMIN in the last 168 hours. No results for input(s): LIPASE, AMYLASE in the last 168 hours. No results for input(s): AMMONIA in the last 168 hours.  CBC: Recent Labs  Lab 10/12/20 0344 10/13/20 0316 10/14/20 0456 10/15/20 0345 10/16/20 0312  WBC 11.7* 10.7* 10.8* 11.1* 15.1*  HGB 8.7* 8.2* 7.8* 7.2* 8.2*  HCT 27.8* 26.7* 26.6* 24.7* 27.5*  MCV 89.7 90.5 92.7 94.6 92.3  PLT 323 325 348 353 345    Cardiac Enzymes: No results for input(s): CKTOTAL, CKMB, CKMBINDEX, TROPONINI in the last 168 hours.  BNP: BNP (last 3 results) Recent Labs    09/23/20 0311  BNP 1,191.8*    ProBNP (last 3 results) No results for input(s): PROBNP in the last 8760 hours.    Other  results:  Imaging: No results found.   Medications:     Scheduled Medications: . amiodarone  200 mg Per Tube Daily  . aspirin  324 mg Per Tube Daily  . atorvastatin  80 mg Per Tube Daily  . chlorhexidine gluconate (MEDLINE KIT)  15 mL Mouth Rinse BID  . Chlorhexidine Gluconate Cloth  6 each Topical Daily  . clopidogrel  75 mg Per Tube Daily  . etomidate      . feeding supplement (PIVOT 1.5 CAL)  1,000 mL Per Tube Q24H  . feeding supplement (PROSource TF)  45 mL Per Tube BID  . folic acid  1 mg Per Tube Daily  . heparin injection (subcutaneous)  5,000 Units Subcutaneous Q8H  . hydrALAZINE  37.5 mg Per Tube Q8H  .  insulin aspart  0-24 Units Subcutaneous Q4H  . insulin aspart  6 Units Subcutaneous Q4H  . insulin glargine  5 Units Subcutaneous BID  . isosorbide dinitrate  10 mg Per Tube TID  . mouth rinse  15 mL Mouth Rinse 10 times per day  . pantoprazole sodium  40 mg Per Tube Daily  . polyethylene glycol  17 g Per Tube Daily  . QUEtiapine  50 mg Per Tube BID  . rOPINIRole  0.5 mg Per Tube QHS  . thiamine  100 mg Per Tube Daily  . valproic acid  250 mg Per Tube BID    Infusions: . lactated ringers Stopped (10/01/20 0607)    PRN Medications: acetaminophen (TYLENOL) oral liquid 160 mg/5 mL, albuterol, fentaNYL (SUBLIMAZE) injection, Gerhardt's butt cream, lip balm, metoprolol tartrate, ondansetron (ZOFRAN) IV, oxyCODONE, sodium chloride flush, sodium chloride flush, sorbitol   Assessment/Plan:   1. 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  - Co-ox 77%  - Holding diuretics w/ AKI. On IVF for hypernatremia.Weight up 10 pounds.  - No ACE/ARB/ARNI/MRA with AKI. No b-blocker yet with shock.  - c/w hydral/nitrates for afterload reduction BP ok   2. NSTEMI with critical CAD - 95% LM and high grade ostial RCA - CABG on 5/6 for critical LM disease (LIMA -> LAD, SVG -> OM, SVG -> RCA) - Continue ASA + Plavix   - Continue statin.  - No s/s angina  3. Acute hypoxic respiratory failure due to pulmonary edema - on vent through trach  - Possible aspiration 5/8 & 5/10. Antibiotics completed 5/16 - Failing vent wean due to agitation and weakness. On TCT now - CCM managing  4. AKI / Uremia  - due to ATN - SCr now plateauing at 2.8 but BUN continues to climb despite holding diuretics for days and giving IVF and FW - still making urine  - Weight now up 10 pounds. - CCM has stopped FW and IVF (agree) - Will do renal u/s looking for  obstruction - Suspect he will need CVVHD soon   5. Ventral Hernia  - 5/10 NG placed to decompress   6. ID - Finished zosyn for possible aspiration 5/16.  - Blood CX 5/6- NGTD, UC 5/13 NGTD  - Afebrile  7. Anemia - Given 1 unit PRBC 5/19. - Hgb 7.4 on co-ox  8. Hypernatremia  - Na 139 today, -  Stopping FW and IVF  9. Atrial fibrillation - In NSR  - Continue amio 200 mg daily.   10. Acute encephalopathy - remains  agitated. - May have component of uremia - Plan as above  11. Dispo - Eventual LTAC once renal status stable   CRITICAL CARE Performed by: Glori Bickers  Total critical care time: 45 minutes  Critical care time was exclusive of separately billable procedures and treating other patients.  Critical care was necessary to treat or prevent imminent or life-threatening deterioration.  Critical care was time spent personally by me (independent of midlevel providers or residents) on the following activities: development of treatment plan with patient and/or surrogate as well as nursing, discussions with consultants, evaluation of patient's response to treatment, examination of patient, obtaining history from patient or surrogate, ordering and performing treatments and interventions, ordering and review of laboratory studies, ordering and review of radiographic studies, pulse oximetry and re-evaluation of patient's condition.    Length of Stay: 24   Glori Bickers, MD  8:41 AM

## 2020-10-17 NOTE — Progress Notes (Signed)
Sutures removed by RT with order from Dr Katrinka Blazing. Patient tolerated well.

## 2020-10-17 NOTE — Progress Notes (Signed)
      [address]       [name] [address]             [phone]      Was agitated earlier, required restraints for safety  BP (!) 165/70   Pulse 84   Temp 98 F (36.7 C) (Oral)   Resp (!) 36   Ht 5\' 6"  (1.676 m)   Wt 85.6 kg   SpO2 98%   BMI 30.46 kg/m   Intake/Output Summary (Last 24 hours) at 10/17/2020 2049 Last data filed at 10/17/2020 1800 Gross per 24 hour  Intake 2601.53 ml  Output 1750 ml  Net 851.53 ml   Continue current care  Maliyah Willets C. 10/19/2020, MD [facility] [phone]

## 2020-10-17 NOTE — Progress Notes (Signed)
Jack Vance was exchanged for a #6 XLT shiley due to continuous cuff leak. Patient tolerated well, color changed was noted on CO2 detector, BBS were heard.

## 2020-10-17 NOTE — Progress Notes (Signed)
24 Days Post-Op Procedure(s) (LRB): CORONARY ARTERY BYPASS GRAFTING (CABG)  TIMES 4 USING LEFT GREATER SAPHENOUS VEIN HARVESTED ENDOSCOPICALLY AND LEFT INTERNAL MAMMARY ARTERY (N/A) TRANSESOPHAGEAL ECHOCARDIOGRAM (TEE) (N/A) Subjective: Alert and at times cooperative  Objective: Vital signs in last 24 hours: Temp:  [97 F (36.1 C)-98.6 F (37 C)] 98.6 F (37 C) (05/30 0347) Pulse Rate:  [63-82] 63 (05/30 0742) Resp:  [16-32] 24 (05/30 0759) BP: (113-160)/(35-88) 128/35 (05/30 0730) SpO2:  [84 %-99 %] 99 % (05/30 0759) FiO2 (%):  [40 %] 40 % (05/30 0742) Weight:  [85.6 kg] 85.6 kg (05/30 0500)  Hemodynamic parameters for last 24 hours:    Intake/Output from previous day: 05/29 0701 - 05/30 0700 In: 4536.6 [I.V.:2836.6; NG/GT:1640] Out: 1600 [Urine:1300; Stool:300] Intake/Output this shift: No intake/output data recorded.  General appearance: alert and no distress Neurologic: nonfocal Heart: regular rate and rhythm Lungs: rhonchi bilaterally Abdomen: distended, nontneder  Lab Results: Recent Labs    10/15/20 0345 10/16/20 0312  WBC 11.1* 15.1*  HGB 7.2* 8.2*  HCT 24.7* 27.5*  PLT 353 345   BMET:  Recent Labs    10/16/20 0312 10/17/20 0530  NA 145 139  K 4.5 4.4  CL 110 104  CO2 27 25  GLUCOSE 185* 117*  BUN 144* 151*  CREATININE 2.80* 2.89*  CALCIUM 8.1* 8.3*    PT/INR: No results for input(s): LABPROT, INR in the last 72 hours. ABG    Component Value Date/Time   PHART 7.410 09/26/2020 0539   HCO3 19.9 (L) 09/26/2020 0539   TCO2 21 (L) 09/26/2020 0539   ACIDBASEDEF 4.0 (H) 09/26/2020 0539   O2SAT 77.0 10/17/2020 0530   CBG (last 3)  Recent Labs    10/16/20 2315 10/17/20 0344 10/17/20 0743  GLUCAP 107* 140* 117*    Assessment/Plan: S/P Procedure(s) (LRB): CORONARY ARTERY BYPASS GRAFTING (CABG)  TIMES 4 USING LEFT GREATER SAPHENOUS VEIN HARVESTED ENDOSCOPICALLY AND LEFT INTERNAL MAMMARY ARTERY (N/A) TRANSESOPHAGEAL ECHOCARDIOGRAM (TEE)  (N/A) -Had a good day yesterday Bradycardia and mucous plugging earlier this AM Co-ox 77 Creatinine up slightly to 2.89 Hypernatremia resolved, decrease free water and D5W, monitor CBG well controlled    LOS: 24 days    Loreli Slot 10/17/2020

## 2020-10-18 ENCOUNTER — Inpatient Hospital Stay (HOSPITAL_COMMUNITY): Payer: No Typology Code available for payment source

## 2020-10-18 DIAGNOSIS — R570 Cardiogenic shock: Secondary | ICD-10-CM | POA: Diagnosis not present

## 2020-10-18 DIAGNOSIS — N179 Acute kidney failure, unspecified: Secondary | ICD-10-CM | POA: Diagnosis not present

## 2020-10-18 DIAGNOSIS — Z951 Presence of aortocoronary bypass graft: Secondary | ICD-10-CM | POA: Diagnosis not present

## 2020-10-18 LAB — URINALYSIS, ROUTINE W REFLEX MICROSCOPIC
Bilirubin Urine: NEGATIVE
Glucose, UA: NEGATIVE mg/dL
Ketones, ur: NEGATIVE mg/dL
Nitrite: NEGATIVE
Protein, ur: 100 mg/dL — AB
RBC / HPF: >50 RBC/hpf — ABNORMAL HIGH (ref 0–5)
Specific Gravity, Urine: 1.014 (ref 1.005–1.030)
WBC, UA: >50 WBC/hpf — ABNORMAL HIGH (ref 0–5)
pH: 5 (ref 5.0–8.0)

## 2020-10-18 LAB — BASIC METABOLIC PANEL
Anion gap: 10 (ref 5–15)
BUN: 159 mg/dL — ABNORMAL HIGH (ref 8–23)
CO2: 27 mmol/L (ref 22–32)
Calcium: 8.3 mg/dL — ABNORMAL LOW (ref 8.9–10.3)
Chloride: 105 mmol/L (ref 98–111)
Creatinine, Ser: 3.05 mg/dL — ABNORMAL HIGH (ref 0.61–1.24)
GFR, Estimated: 20 mL/min — ABNORMAL LOW (ref >60)
Glucose, Bld: 92 mg/dL (ref 70–99)
Potassium: 4.5 mmol/L (ref 3.5–5.1)
Sodium: 142 mmol/L (ref 135–145)

## 2020-10-18 LAB — COOXEMETRY PANEL
Carboxyhemoglobin: 1 % (ref 0.5–1.5)
Methemoglobin: 0.7 % (ref 0.0–1.5)
O2 Saturation: 73.3 %
Total hemoglobin: 10.5 g/dL — ABNORMAL LOW (ref 12.0–16.0)

## 2020-10-18 LAB — GLUCOSE, CAPILLARY
Glucose-Capillary: 109 mg/dL — ABNORMAL HIGH (ref 70–99)
Glucose-Capillary: 120 mg/dL — ABNORMAL HIGH (ref 70–99)
Glucose-Capillary: 126 mg/dL — ABNORMAL HIGH (ref 70–99)
Glucose-Capillary: 128 mg/dL — ABNORMAL HIGH (ref 70–99)
Glucose-Capillary: 138 mg/dL — ABNORMAL HIGH (ref 70–99)
Glucose-Capillary: 81 mg/dL (ref 70–99)
Glucose-Capillary: 89 mg/dL (ref 70–99)

## 2020-10-18 LAB — SODIUM, URINE, RANDOM: Sodium, Ur: 27 mmol/L

## 2020-10-18 LAB — CBC
HCT: 24.8 % — ABNORMAL LOW (ref 39.0–52.0)
Hemoglobin: 7.4 g/dL — ABNORMAL LOW (ref 13.0–17.0)
MCH: 27.4 pg (ref 26.0–34.0)
MCHC: 29.8 g/dL — ABNORMAL LOW (ref 30.0–36.0)
MCV: 91.9 fL (ref 80.0–100.0)
Platelets: 336 10*3/uL (ref 150–400)
RBC: 2.7 MIL/uL — ABNORMAL LOW (ref 4.22–5.81)
RDW: 18.3 % — ABNORMAL HIGH (ref 11.5–15.5)
WBC: 13.8 10*3/uL — ABNORMAL HIGH (ref 4.0–10.5)
nRBC: 0 % (ref 0.0–0.2)

## 2020-10-18 LAB — CREATININE, URINE, RANDOM: Creatinine, Urine: 31.58 mg/dL

## 2020-10-18 MED ORDER — ASPIRIN 81 MG PO CHEW
81.0000 mg | CHEWABLE_TABLET | Freq: Every day | ORAL | Status: DC
  Administered 2020-10-18 – 2020-10-20 (×3): 81 mg
  Filled 2020-10-18 (×3): qty 1

## 2020-10-18 MED ORDER — FREE WATER
100.0000 mL | Status: DC
  Administered 2020-10-18 – 2020-10-19 (×7): 100 mL

## 2020-10-18 NOTE — Progress Notes (Signed)
Occupational Therapy Treatment Patient Details Name: Jack Vance MRN: [id number] DOB: 03/03/1941 Today's Date: 10/18/2020    History of present illness 80 yo admitted 5/6 with cardiogenic shock secondary to NSTEMI with acute pulmonary edema and heart failure s/p urgent LHC. 5/6 IABP and CABG with IABP out 5/9. Pt remained intubated post op. Trach 5/23. PMhx: arthritis, asthma, DDD, RLS   OT comments  Pt with slow progress towards OT goals. Pt with minimal following of commands though could also be impacted by pt being very HOH. Pt Total A for eggress to foot of bed and able to maintain balance briefly with Min A then fatigued and required Max A to maintain static sitting balance. While sitting, pt began perseverating on pulling at all lines, removing vent with strong grip and difficult to redirect. Pt returned to bed and guided in oral care at Total A. Pt able to grab and bring suction swab to mouth though tended to bite swab rather than perform brushing motion. At end of session, pt stuck tongue out at rehab tech and appeared to chuckle when the gesture was returned. Will continue to progress cognition with ADLs and sitting balance/endurance as appropriate.  Vent PVRC 30% FiO2, PEEP 5, 22-25 RR SpO2 >90%, BP 148/51   Follow Up Recommendations  LTACH    Equipment Recommendations  Other (comment) (to be determined)    Recommendations for Other Services      Precautions / Restrictions Precautions Precautions: Fall;Sternal;Other (comment) Precaution Booklet Issued: No Precaution Comments: trach, vent, cortrak, flexiseal, primo fit, bil mittens and wrist restraints Restrictions Weight Bearing Restrictions: No Other Position/Activity Restrictions: sternal precautions       Mobility Bed Mobility Overal bed mobility: Needs Assistance Bed Mobility: Supine to Sit;Sit to Supine Rolling: Mod assist   Supine to sit: Total assist Sit to supine: Total assist   General bed mobility  comments: total assist for foot egrees supine <>sit. total +2 to slide toward HOB. Sat for 5-7 min before too perseverated on pulling at lines    Transfers Overall transfer level: Needs assistance   Transfers: Sit to/from Stand Sit to Stand: Max assist;+2 safety/equipment         General transfer comment: bil knees blocked with use of pad to craddle sacrum and max +2 for standing x 2. Pt able to clear sacrum but lack of significant assist from pt into standing with maintained knee flexion and hip external rotation bil    Balance Overall balance assessment: Needs assistance Sitting-balance support: No upper extremity supported;Bilateral upper extremity supported;Feet supported Sitting balance-Leahy Scale: Poor Sitting balance - Comments: overall Max A with forward/posterior R lean. initially Min A then fatigued within 1 min   Standing balance support: Bilateral upper extremity supported Standing balance-Leahy Scale: Zero Standing balance comment: max +2 assist                           ADL either performed or assessed with clinical judgement   ADL Overall ADL's : Needs assistance/impaired     Grooming: Total assistance;Bed level;Oral care Grooming Details (indicate cue type and reason): Total A for oral care using swab, pt did hold suction swab setup and bring to mouth but noted to bite swab rather than demo brushing motion             Lower Body Dressing: Total assistance;Bed level Lower Body Dressing Details (indicate cue type and reason): Total A to don socks bed level. Pt  did extend R LE when therapist reaching to don this sock, unsure if voluntary vs involuntary               General ADL Comments: Overall Total A for ADLs bed level at this time, some appropriate responses/movements but unsure if voluntary vs involuntary     Vision   Vision Assessment?: No apparent visual deficits Additional Comments: to be further assessed, tendency to look to R1    Perception     Praxis      Cognition Arousal/Alertness: Awake/alert Behavior During Therapy: Flat affect Overall Cognitive Status: Difficult to assess Area of Impairment: Following commands;Attention;Problem solving;Safety/judgement                   Current Attention Level: Focused Memory: Decreased short-term memory;Decreased recall of precautions Following Commands: Follows one step commands inconsistently Safety/Judgement: Decreased awareness of deficits;Decreased awareness of safety Awareness: Intellectual Problem Solving: Slow processing;Decreased initiation;Difficulty sequencing;Requires verbal cues;Requires tactile cues General Comments: pt on vent with trach, only one hearing aid in and question if it is working with pt very HOH at baseline. no consistent following of commands, did nod yes when asked if tired. Pt also stuck out his tongue at rehab tech at end of session and seemed to chuckle when responded in similar manner.        Exercises General Exercises - Lower Extremity Long Arc Quad: PROM;Seated;10 reps;Both (multimodal cues with initial quad contraction but not maintained)   Shoulder Instructions       General Comments      Pertinent Vitals/ Pain       Pain Assessment: Faces Faces Pain Scale: No hurt Pain Intervention(s): Monitored during session  Home Living                                          Prior Functioning/Environment              Frequency  Min 2X/week        Progress Toward Goals  OT Goals(current goals can now be found in the care plan section)  Progress towards OT goals: Not progressing toward goals - comment  Acute Rehab OT Goals Patient Stated Goal: none stated OT Goal Formulation: Patient unable to participate in goal setting Time For Goal Achievement: 10/25/20 Potential to Achieve Goals: Good ADL Goals Pt Will Perform Grooming: with mod assist;sitting Pt Will Perform Upper Body Bathing: with  max assist;sitting Additional ADL Goal #1: Pt to demo ability to follow one step commands > 30% of the time to increase ADL participation Additional ADL Goal #2: Pt to improve sitting balance to require no more than Min A > 5 min during functional tasks  Plan Discharge plan remains appropriate;Frequency remains appropriate    Co-evaluation                 AM-PAC OT "6 Clicks" Daily Activity     Outcome Measure   Help from another person eating meals?: Total Help from another person taking care of personal grooming?: Total Help from another person toileting, which includes using toliet, bedpan, or urinal?: Total Help from another person bathing (including washing, rinsing, drying)?: Total Help from another person to put on and taking off regular upper body clothing?: Total Help from another person to put on and taking off regular lower body clothing?: Total 6 Click Score: 6    End  of Session    OT Visit Diagnosis: Muscle weakness (generalized) (M62.81);Other abnormalities of gait and mobility (R26.89);Other symptoms and signs involving cognitive function   Activity Tolerance Patient limited by fatigue   Patient Left in bed;with call bell/phone within reach;with bed alarm set;with restraints reapplied   Nurse Communication Mobility status        Time: 1027-1100 OT Time Calculation (min): 33 min  Charges: OT General Charges $OT Visit: 1 Visit OT Treatments $Self Care/Home Management : 8-22 mins $Therapeutic Activity: 8-22 mins  Bradd Canary, OTR/L Acute Rehab Services Office: [phone]   Lorre Munroe 10/18/2020, 11:12 AM

## 2020-10-18 NOTE — Progress Notes (Signed)
Patient ID: RISHITH SIDDOWAY, male   DOB: November 03, 1940, 80 y.o.   MRN: [id number]   Asked by Dr. Tamala Julian to reevaluate Mr. Llorens who was earlier followed this hospitalization by the nephrology service for acute kidney injury in the setting of cardiogenic shock.  He is suspected to have suffered multiple renal insults including ATN and possible cholesterol emboli injury after emergent CABG/LHC after presentation for NSTEMI.  He now has rising BUN/creatinine and overnight noted to have significant urine retention for which Foley catheter was placed.  He remains encephalopathic/agitated and occasionally follows commands while on ventilator via tracheostomy.  Creatinine over the past week has risen from 1.6-3.0 with a rise of his BUN from 83 to 159 in the setting of ongoing enteral feeding supplements.  [facility] Progress Note   Assessment/ Plan:   1. Acute kidney Injury: Suspected multifactorial from ATN, cardiogenic shock and possible cholesterol emboli injury with a background of essentially normal renal function.  Recent rise of BUN/creatinine is suspected to be possibly related to obstruction from significant urine retention seen.  I will monitor labs overnight to decide on need for additional intervention.  He would benefit from reduced protein load from his tube feeds to limit worsening azotemia.  He is nonoliguric at this time with trace lower extremity edema.  Variable mental status not convincing for uremic encephalopathy. 2.  Non-ST elevation MI status post emergent four-vessel CABG on 5/6.  Suffered cardiogenic shock from which he appears to have recovered with intermittent diuretic dosing. 3.  Acute hypoxic respiratory failure: Secondary to aspiration pneumonia and pulmonary edema/cardiogenic shock and now on ventilator via tracheostomy 4.  Anemia: Secondary to critical illness, check iron studies and decide on need for supplementation versus ESA.  No indication for PRBC  transfusion.  Subjective:   Unable to voice any problems-appears confused.   Objective:   BP (!) 158/66   Pulse 95   Temp (!) 97.3 F (36.3 C)   Resp (!) 22   Ht $R'5\' 6"'ul$  (1.676 m)   Wt 83.6 kg   SpO2 97%   BMI 29.75 kg/m   Intake/Output Summary (Last 24 hours) at 10/18/2020 1137 Last data filed at 10/18/2020 0900 Gross per 24 hour  Intake 1410 ml  Output 4150 ml  Net -2740 ml   Weight change: -2 kg  Physical Exam: Gen: Appears to be confused resting in bed, makes eye contact but unable to give verbal responses. CVS: Pulse regular rhythm, normal rate, S1 and S2 normal Resp: Coarse/transmitted breath sounds bilaterally without distinct rales/wheeze Abd: Soft, obese, nontender, bowel sounds normal Ext: Trace lower extremity edema  Imaging: DG Chest 1 View  Result Date: 10/17/2020 CLINICAL DATA:  Follow-up patient with tracheostomy tube. Respiratory distress. EXAM: CHEST  1 VIEW COMPARISON:  10/10/2020 and older studies. FINDINGS: Tracheostomy tube is well position. Enteric tube passes below the diaphragm and below the included field of view. Nasal/orogastric tube present on the prior exam has been removed. Stable changes from prior CABG surgery. Cardiac silhouette mildly enlarged. No mediastinal or hilar masses. Bilateral interstitial thickening. Bilateral lung base opacities obscuring the right and mostly obscuring the left hemidiaphragm is consistent with pleural effusions and dependent atelectasis. Lung base opacities have improved compared to the prior study. No pneumothorax. IMPRESSION: 1. Improved basilar opacities since the prior study, consistent with a decrease in pleural effusions and atelectasis. 2. Persistent bilateral interstitial thickening, presumed chronic. No new lung abnormalities. 3. Well-positioned tracheostomy tube.  Stable enteric tube. Electronically Signed  By: Lajean Manes M.D.   On: 10/17/2020 09:24   US RENAL  Result Date: 10/17/2020 CLINICAL DATA:  Acute  kidney injury EXAM: RENAL / URINARY TRACT ULTRASOUND COMPLETE COMPARISON:  CT abdomen/pelvis dated 12/03/2017 FINDINGS: Right Kidney: Renal measurements: 10.6 x 4.8 x 5.0 cm = volume: 132 mL. Echogenicity within normal limits. No mass or hydronephrosis visualized. Left Kidney: Renal measurements: 11.1 x 5.9 x 5.7 cm = volume: 193 mL. Echogenicity within normal limits. 5 mm interpolar calculus. Mild hydronephrosis. Bladder: Appears normal for degree of bladder distention. Other: None. IMPRESSION: 5 mm interpolar left renal calculus with mild left hydronephrosis. Electronically Signed   By: Julian Hy M.D.   On: 10/17/2020 10:18   DG Chest Port 1 View  Result Date: 10/18/2020 CLINICAL DATA:  80 year old male with respiratory failure. Tracheostomy. EXAM: PORTABLE CHEST 1 VIEW COMPARISON:  Portable chest 10/17/2020 and earlier. FINDINGS: Portable AP semi upright view at 0431 hours. Stable mild patient rotation to the right. Tracheostomy tube appears stable. Enteric feeding tube courses to the abdomen, tip not included. There is a new battery or generator device projecting at the right thoracic inlet of unclear etiology and significance. Large lung volumes. Stable cardiac size and mediastinal contours. Prior CABG. Asymmetric increased reticular opacity in the left lung with pleural thickening and/or small pleural effusion on that side. Mild contralateral right pleural thickening suspected but otherwise when allowing for portable technique the right lung is clear. Right lung ventilation has improved since 10/10/2020. Ventilation is stable from 10/17/2020. Previous right humerus ORIF.  Stable visualized osseous structures. IMPRESSION: 1. Stable lines and tubes. 2. Stable ventilation since yesterday with possible left lung infection in the form of asymmetric reticular opacity and pleural thickening or small left effusion. Electronically Signed   By: Genevie Ann M.D.   On: 10/18/2020 05:23    Labs: BMET Recent  Labs  Lab 10/13/20 0316 10/14/20 0456 10/14/20 1301 10/15/20 0345 10/16/20 0312 10/17/20 0530 10/18/20 0420  NA 146* 151* 151* 151* 145 139 142  K 3.8 4.8 4.8 4.6 4.5 4.4 4.5  CL 109 115* 114* 116* 110 104 105  CO2 $Re'27 29 28 28 27 25 27  'Pdq$ GLUCOSE 155* 112* 123* 149* 185* 117* 92  BUN 101* 126* 135* 145* 144* 151* 159*  CREATININE 2.02* 2.43* 2.62* 2.73* 2.80* 2.89* 3.05*  CALCIUM 8.5* 8.5* 8.4* 8.5* 8.1* 8.3* 8.3*   CBC Recent Labs  Lab 10/14/20 0456 10/15/20 0345 10/16/20 0312 10/18/20 0420  WBC 10.8* 11.1* 15.1* 13.8*  HGB 7.8* 7.2* 8.2* 7.4*  HCT 26.6* 24.7* 27.5* 24.8*  MCV 92.7 94.6 92.3 91.9  PLT 348 353 345 336    Medications:    . amiodarone  200 mg Per Tube Daily  . aspirin  81 mg Per Tube Daily  . atorvastatin  80 mg Per Tube Daily  . chlorhexidine gluconate (MEDLINE KIT)  15 mL Mouth Rinse BID  . Chlorhexidine Gluconate Cloth  6 each Topical Daily  . clopidogrel  75 mg Per Tube Daily  . feeding supplement (PIVOT 1.5 CAL)  1,000 mL Per Tube Q24H  . feeding supplement (PROSource TF)  45 mL Per Tube BID  . folic acid  1 mg Per Tube Daily  . free water  100 mL Per Tube Q4H  . heparin injection (subcutaneous)  5,000 Units Subcutaneous Q8H  . hydrALAZINE  37.5 mg Per Tube Q8H  . insulin aspart  0-24 Units Subcutaneous Q4H  . insulin glargine  5 Units Subcutaneous  BID  . isosorbide dinitrate  10 mg Per Tube TID  . mouth rinse  15 mL Mouth Rinse 10 times per day  . pantoprazole sodium  40 mg Per Tube Daily  . polyethylene glycol  17 g Per Tube Daily  . QUEtiapine  50 mg Per Tube BID  . rOPINIRole  0.5 mg Per Tube QHS  . thiamine  100 mg Per Tube Daily  . valproic acid  250 mg Per Tube BID   Elmarie Shiley, MD 10/18/2020, 11:37 AM

## 2020-10-18 NOTE — Progress Notes (Signed)
  Speech Language Pathology Treatment: Hillary Bow Speaking valve  Patient Details Name: Jack Vance MRN: [id number] DOB: 1940-06-19 Today's Date: 10/18/2020 Time: 1003-1020 SLP Time Calculation (min) (ACUTE ONLY): 17 min  Assessment / Plan / Recommendation Clinical Impression  Pt's alertness fluctuated during session with focus on inline PMV use with RT present to assist with vent management. Suction was performed prior to placement with cuff deflated and PEEP/rate adjusted but pt left in PRVC. Pt intermittently said "I don't know" with reduced articulation but adequate intelligibility with known phrase. He appeared to respond to a direct yes/no question x1 with a head nod. Attempts to elicit communication throughout the session including verbal and written prompts. Glasses were donned and hearing aid was not in his R ear, but pt still seemed to respond the most when SLP was communicating into his R ear with increased volume. Will continue PMV trials with SLP and RT as pt continues to work on weaning.    HPI HPI: Pt is a 80 yo male presenting with cardiogenic shock secondary to NSTEMI with acute pulmonary edema and heart failure s/p urgent LHC. 5/6 IABP and CABG with IABP out 5/9. Pt remained intubated post op. Trach 5/23. PMhx: arthritis, asthma, DDD, RLS      SLP Plan  Continue with current plan of care       Recommendations         Patient may use Passy-Muir Speech Valve: with SLP only (and RT) PMSV Supervision: Full         Oral Care Recommendations: Oral care QID Follow up Recommendations: LTACH SLP Visit Diagnosis: Aphonia (R49.1) Plan: Continue with current plan of care       GO                 Mahala Menghini., M.A. CCC-SLP Acute Rehabilitation Services Pager [phone] Office [phone]  10/18/2020, 10:53 AM

## 2020-10-18 NOTE — Progress Notes (Signed)
Physical Therapy Treatment Patient Details Name: Jack Vance MRN: [id number] DOB: 11-Jan-1941 Today's Date: 10/18/2020    History of Present Illness 79 yo admitted 5/6 with cardiogenic shock secondary to NSTEMI with acute pulmonary edema and heart failure s/Vance urgent LHC. 5/6 IABP and CABG with IABP out 5/9. Pt remained intubated post op. Trach 5/23. PMhx: arthritis, asthma, DDD, RLS    PT Comments    Pt initially lethargic but easily arousable and more attentive and alert during session than any prior session. Unclear if hearing aids are working as only one present and even with yelling but unable to demonstrate hearing/understanding. Pt able to sit EOb with significantly improved balance with anterior lean with fatigue. Pt without activation into standing with arms blocked by therapist and tech and no significant engagement for bil LE HEP. Will continue to follow and as command following improves progress transfers. Pt with continued automatic movement of bil arms and legs in bed when not restrained.   Discussed hearing aid issues with RN PRVC 30% FiO2 with SpO2 96% Hr 72-73 Pre activity 138/67 (87) Post 151/56 (84)    Follow Up Recommendations  LTACH;Supervision/Assistance - 24 hour     Equipment Recommendations  Hospital bed;Wheelchair (measurements PT)    Recommendations for Other Services       Precautions / Restrictions Precautions Precautions: Fall;Sternal;Other (comment) Precaution Comments: trach, vent, cortrak, flexiseal, primo fit, bil mittens and wrist restraints    Mobility  Bed Mobility Overal bed mobility: Needs Assistance Bed Mobility: Supine to Sit;Sit to Supine Rolling: Mod assist   Supine to sit: Total assist Sit to supine: Total assist   General bed mobility comments: total assist for foot egrees supine <>sit. total +2 to slide toward [facility] and mod assist to roll.  Sat EOB for 15 minutes.    Transfers Overall transfer level: Needs assistance    Transfers: Sit to/from Stand Sit to Stand: Max assist;+2 safety/equipment         General transfer comment: bil knees blocked with use of pad to craddle sacrum and max +2 for standing x 2. Pt able to clear sacrum but lack of significant assist from pt into standing with maintained knee flexion and hip external rotation bil  Ambulation/Gait             General Gait Details: unable   Stairs             Wheelchair Mobility    Modified Rankin (Stroke Patients Only)       Balance Overall balance assessment: Needs assistance Sitting-balance support: No upper extremity supported;Bilateral upper extremity supported;Feet supported Sitting balance-Leahy Scale: Poor Sitting balance - Comments: pt with minguard assist sitting EOB at times with and without UE on bed. With fatigue pt with significant anterior lean and mod assist   Standing balance support: Bilateral upper extremity supported Standing balance-Leahy Scale: Zero Standing balance comment: max +2 assist                            Cognition Arousal/Alertness: Awake/alert Behavior During Therapy: Flat affect Overall Cognitive Status: Difficult to assess                     Current Attention Level: Focused   Following Commands: Follows one step commands inconsistently Safety/Judgement: Decreased awareness of deficits;Decreased awareness of safety     General Comments: pt on vent with trach, only one hearing aid in and question if  it is working with pt very HOH at baseline. Pt no following tactile of verbal commands but more alert and increased balance from prior sessions      Exercises General Exercises - Lower Extremity Long Arc Quad: PROM;Seated;10 reps;Both (multimodal cues with initial quad contraction but not maintained)    General Comments        Pertinent Vitals/Pain Pain Assessment: No/denies pain    Home Living                      Prior Function             PT Goals (current goals can now be found in the care plan section) Progress towards PT goals: Progressing toward goals    Frequency    Min 3X/week      PT Plan Current plan remains appropriate    Co-evaluation              AM-PAC PT "6 Clicks" Mobility   Outcome Measure  Help needed turning from your back to your side while in a flat bed without using bedrails?: Total Help needed moving from lying on your back to sitting on the side of a flat bed without using bedrails?: Total Help needed moving to and from a bed to a chair (including a wheelchair)?: Total Help needed standing up from a chair using your arms (e.g., wheelchair or bedside chair)?: Total Help needed to walk in hospital room?: Total Help needed climbing 3-5 steps with a railing? : Total 6 Click Score: 6    End of Session   Activity Tolerance: Patient tolerated treatment well Patient left: with call bell/phone within reach;in bed;with bed alarm set Nurse Communication: Mobility status;Need for lift equipment PT Visit Diagnosis: Other abnormalities of gait and mobility (R26.89);Muscle weakness (generalized) (M62.81)     Time: 6720-9470 PT Time Calculation (min) (ACUTE ONLY): 33 min  Charges:  $Therapeutic Activity: 23-37 mins                     Jack Vance, PT Acute Rehabilitation Services Pager: [phone] Office: [phone]    Jack Vance 10/18/2020, 9:41 AM

## 2020-10-18 NOTE — Plan of Care (Signed)
  Problem: Cardiac: Goal: Will achieve and/or maintain hemodynamic stability Outcome: Progressing   Problem: Respiratory: Goal: Respiratory status will improve Outcome: Progressing   Problem: Urinary Elimination: Goal: Ability to achieve and maintain adequate renal perfusion and functioning will improve Outcome: Not Progressing   Problem: Coping: Goal: Level of anxiety will decrease Outcome: Not Progressing

## 2020-10-18 NOTE — Progress Notes (Signed)
NAME:  Jack Vance, MRN:  [id number], DOB:  1940/06/24, LOS: 25 ADMISSION DATE:  09/23/2020, CONSULTATION DATE:  09/24/2019 REFERRING MD:  Jacinto Halim, CHIEF COMPLAINT:  Acute coronary syndrome   History of Present Illness:  80 year old man who presented with cardiogenic shock secondary to NSTEMI.  Presented with new onset RSCP and severe dyspnea. Found to have acute pulmonary edema and heart failure; intubated and sent for urgent LHC which revealed 3V CAD.  IABP inserted and patient underwent urgent CABG. IABP removed 5/9. Continues to require full mechanical ventilation support.  Pertinent  Medical History   Past Medical History:  Diagnosis Date  . Arthritis   . Asthma   . DDD (degenerative disc disease), lumbar   . Hernia of abdominal wall   . RLS (restless legs syndrome)    Significant Hospital Events: Including procedures, antibiotic start and stop dates in addition to other pertinent events    . 5/6 CABG with Dr Cliffton Asters with LIMA to LAD, SVG to RCA d and SVG to ramus. LV normal and returned to ICU with no vasopressor support.  . No post-operative bleeding but poor RV performance on hemodynamics.  . 5/8 significant hemodynamic instability with labile blood pressure and vasopressor requirements yesterday.  Episode of emesis but no evidence of bowel obstruction.  Evidence of pericardial tamponade. . 5/9 improved hemodynamic, able now to tolerate balloon pump weaning and removal. . 5/11 Stable hemodynamics, continuing diuresis. Improved UOP. TPN consult for initiation 5/12 in the setting of ileus, prolonged NPO status.  . 5/13 sedated, diuresis, milrinone, PICC line today, CVL removed  . 5/14 tolerated short SBT/SAT on Precedex . 5/16 Not tolerating SBT due to significant tachypnea to 50s, placed back on PRVC. Marland Kitchen 5/17 Improved SBT toleration, weaning milrinone, minimizing sedation. Bronched w/ c/f mucus plugging, airways clear, ETT exchanged due to significant buildup. Diuresis  held. . 5/18 More agitated overnight requiring increased sedation with addition of Fentanyl/Versed, Seroquel started. BUN continues to rise. Nephro consult.   5/19 Continues to be agitated overnight requiring increased sedation with fentanyl/versed. Started Klonopin, Seroquel and Requip yesterday. Started on valproate and seroquel increased.  5/20 Patient more interactive following commands and maintaining eyes open and tracking.  5/21 failing SBT's.  Plan for trach next week  5/23 Trach (BI/DS)  5/25 NGT removed, cortrack in place for TF   5/26 pressure support trial  5/27 worsening kideny function   5/29 serum creatinine stable  5/30 cuff leak issues: trach exchanged for distal XLT  5/31 having urinary retention issues  Interim History / Subjective:  No events. Worsening uremia but making urine. Foley to be replaced today.  Objective   Blood pressure (!) 142/62, pulse 77, temperature 98 F (36.7 C), resp. rate (!) 22, height 5\' 6"  (1.676 m), weight 83.6 kg, SpO2 96 %.    Vent Mode: PRVC FiO2 (%):  [30 %-40 %] 30 % Set Rate:  [22 bmp] 22 bmp Vt Set:  [560 mL] 560 mL PEEP:  [5 cmH20] 5 cmH20 Plateau Pressure:  [18 cmH20-34 cmH20] 28 cmH20   Intake/Output Summary (Last 24 hours) at 10/18/2020 0651 Last data filed at 10/18/2020 0400 Gross per 24 hour  Intake 2005.23 ml  Output 3250 ml  Net -1244.77 ml   Filed Weights   10/14/20 0419 10/17/20 0500 10/18/20 0600  Weight: 80 kg 85.6 kg 83.6 kg   Physical Examination: BP (!) 142/62   Pulse 77   Temp 98 F (36.7 C)   Resp (!) 22  Ht 5\' 6"  (1.676 m)   Wt 83.6 kg   SpO2 96%   BMI 29.75 kg/m    Constitutional: chronically ill appearing man in NAD  Eyes: pupils equal, tracking Ears, nose, mouth, and throat: trach in place, small thick secretions Cardiovascular: RRR, ext warm Respiratory: Rhonci worse on R, no accessory muscle use Gastrointestinal: soft, +BS Skin: pale, normal turgor Neurologic: moves all 4 ext  to command but intermittent, globally weak Psychiatric: RASS 0  Patient Lines/Drains/Airways Status    Active Line/Drains/Airways    Name Placement date Placement time Site Days   PICC Triple Lumen 09/30/20 PICC Left Brachial 43 cm 0 cm 09/30/20  1045  -- 18   Urethral Catheter 10/02/20 RN Non-latex 10/18/20  0858  Non-latex  less than 1   Fecal Management System 10/11/20  1130  -- 7   Incision (Closed) 09/23/20 Chest Other (Comment) 09/23/20  1336  -- 25   Incision (Closed) 09/23/20 Leg Right 09/23/20  1603  -- 25   Incision (Closed) 09/23/20 Leg Left 09/23/20  1603  -- 25   Nasoenteric Feeding Tube Cortrak - 55 inches 10 Fr. Left nare Documented cm marking at nare/ corner of mouth 109 cm 09/26/20  1031  Left nare  22   Tracheostomy Shiley XLT Distal 6 mm Cuffed 10/17/20  0845  6 mm  1   Pressure Injury 09/30/20 Buttocks Left Stage 2 -  Partial thickness loss of dermis presenting as a shallow open injury with a red, pink wound bed without slough. 09/30/20  0000  -- 18   Wound / Incision (Open or Dehisced) 12/04/17 Other (Comment) Abdomen Right;Upper 12/04/17  0035  Abdomen  1049            Labs/imaging that I have personally reviewed: (right click and "Reselect all SmartList Selections" daily)  SvO2 73% BUN/Cr up Net + H/H down  Resolved Hospital Problem List   NSTEMI, Thrombocytopenia  Assessment & Plan:   Acute hypoxemic hypercapnic respiratory failure requiring mechanical ventilation. Prolonged vent weaning due to NM weakness. Possible aspiration Trach malfunction- blown cuff,  - Start PS trials  Critically ill due to RV dysfunction following ACS and cardiopulmonary bypass  Cardiogenic shock NSTEMI with culprit RCA involvement Coronary artery disease -Continue amiodarone, hydralazine, isosorbide - DAPT - Holding Entresto due to elevated serum creatinine - Appreciate heart failure service input.  Acute kidney injury Uremia Hypernatremia Urinary retention -  Low dose FWF - Place foley - Monitor, see if we can avoid HD if able  Gastric ileus, improved -Continue tube feeds, sorbitol  Anemia (Baseline ~10) - Monitor daily, slow drop likely related to AKI, critical illness and phlebotomy  Agitated delirium controlled H/o alcohol abuse - Continue seroquel and valproate  RLS -Requip nightly  DM with hypoglycemia -Lantus plus SSI; stop TF coverage  Mobility, Deconditioning   -PT OT  Dysphagia- may need PEG at some point  Best Practice: (right click and "Reselect all SmartList Selections" daily)  Diet:  Tube Feed   Pain/Anxiety/Delirium protocol (if indicated): Yes (RASS goal 0) VAP protocol (if indicated): Yes DVT prophylaxis: Subcutaneous Heparin GI prophylaxis: PPI Glucose control:  SSI Yes and Basal insulin Yes, Central venous access:  Yes, and it is still needed Arterial line:  N/A Foley:  Yes, and it is still needed Mobility:  bed rest  PT consulted: yes Last date of multidisciplinary goals of care discussion per primary team  Code Status:  full code Disposition: LTACH pending renal stability/recovery  Myrla Halsted MD [facility]  Prefer epic messenger for cross cover needs If after hours, please call E-link

## 2020-10-18 NOTE — Progress Notes (Signed)
      [address]       [place] [address]             [phone]                 25 Days Post-Op Procedure(s) (LRB): CORONARY ARTERY BYPASS GRAFTING (CABG)  TIMES 4 USING LEFT GREATER SAPHENOUS VEIN HARVESTED ENDOSCOPICALLY AND LEFT INTERNAL MAMMARY ARTERY (N/A) TRANSESOPHAGEAL ECHOCARDIOGRAM (TEE) (N/A)   Events: Straight cathed overnight with 1600 return  _______________________________________________________________ Vitals: BP (!) 158/66   Pulse 92   Temp 98.1 F (36.7 C) (Oral)   Resp (!) 26   Ht 5\' 6"  (1.676 m)   Wt 83.6 kg   SpO2 96%   BMI 29.75 kg/m   - Neuro:sedated  - Cardiovascular: Normal sinus rhythm.  I  Drips:  none    - Pulm:  Vent Mode: PRVC FiO2 (%):  [30 %-40 %] 30 % Set Rate:  [22 bmp] 22 bmp Vt Set:  [560 mL] 560 mL PEEP:  [5 cmH20] 5 cmH20 Plateau Pressure:  [23 cmH20-30 cmH20] 26 cmH20  ABG    Component Value Date/Time   PHART 7.410 09/26/2020 0539   PCO2ART 31.1 (L) 09/26/2020 0539   PO2ART 136 (H) 09/26/2020 0539   HCO3 19.9 (L) 09/26/2020 0539   TCO2 21 (L) 09/26/2020 0539   ACIDBASEDEF 4.0 (H) 09/26/2020 0539   O2SAT 73.3 10/18/2020 0420    - Abd: distended, but soft - Extremity: Warm  .Intake/Output      05/30 0701 05/31 0700 05/31 0701 06/01 0700   P.O. 0    I.V. (mL/kg) 141.7 (1.7)    Other 150    NG/GT 1440 60   Total Intake(mL/kg) 1731.7 (20.7) 60 (0.7)   Urine (mL/kg/hr) 2900 (1.4) 1950 (2.3)   Stool 550    Total Output 3450 1950   Net -1718.3 -1890           _______________________________________________________________ Labs: CBC Latest Ref Rng & Units 10/18/2020 10/16/2020 10/15/2020  WBC 4.0 - 10.5 K/uL 13.8(H) 15.1(H) 11.1(H)  Hemoglobin 13.0 - 17.0 g/dL 7.4(L) 8.2(L) 7.2(L)  Hematocrit 39.0 - 52.0 % 24.8(L) 27.5(L) 24.7(L)  Platelets 150 - 400 K/uL 336 345 353   CMP Latest Ref Rng & Units 10/18/2020 10/17/2020 10/16/2020  Glucose 70 - 99 mg/dL 92 10/18/2020) 469(G)  BUN 8 - 23 mg/dL  295(M) 841(L) 244(W)  Creatinine 0.61 - 1.24 mg/dL 102(V) 2.53(G) 6.44(I)  Sodium 135 - 145 mmol/L 142 139 145  Potassium 3.5 - 5.1 mmol/L 4.5 4.4 4.5  Chloride 98 - 111 mmol/L 105 104 110  CO2 22 - 32 mmol/L 27 25 27   Calcium 8.9 - 10.3 mg/dL 8.3(L) 8.3(L) 8.1(L)  Total Protein 6.5 - 8.1 g/dL - - -  Total Bilirubin 0.3 - 1.2 mg/dL - - -  Alkaline Phos 38 - 126 U/L - - -  AST 15 - 41 U/L - - -  ALT 0 - 44 U/L - - -    CXR: -  _______________________________________________________________  Assessment and Plan: POD 25 s/p emergency CABG.  Neuro: agitation improving CV:  Off milr.   Pulm: wean vent as tolerated Renal: making urine now.  Foley in place GI: on goal tube feeds. Heme: Stable 3.47(Q.   Endo: Sliding scale insulin. Dispo: Continue ICU care. Will watch creat 10/18/2020 5:02 PM

## 2020-10-18 NOTE — Progress Notes (Signed)
Patient ID: Jack Vance, male   DOB: 04-30-1941, 80 y.o.   MRN: [id number]    Advanced Heart Failure Rounding Note   Subjective:    Underwent emergent CABG on 5/6 for critical LM disease (LIMA -> LAD, SVG -> OM, SVG -> RCA) 5/6 Cultures negative.  5/8 Possible aspiration. A fib --> started amio drip.  Given 2u PRBCs. Hypotensive in the evening and started on vasopressin + antibiotics.  5/9 IABP removed 5/10 NG placed >1.5 out  5/11 started neostigmime 5/12 Febrile 102 5/17 Failed SBT. Underwent bronchoscopy for attempted therapeutic aspiration for mucus plugging. ETT exchanged.  5/19 Hgb 6.1 given 1UPRBC 5/21  Failed SBT  5/23 Trach.   On vent through trach. Remains very agitated. Will follow occasional commands  Trach changed yesterday.  SCr worse. Renal u/s with mild L hydro   Overnight found to be in urinary retention and straight cath with 1600cc out    Objective:   Weight Range:  Vital Signs:   Temp:  [98 F (36.7 C)] 98 F (36.7 C) (05/31 0000) Pulse Rate:  [63-84] 77 (05/31 0448) Resp:  [12-36] 22 (05/31 0600) BP: (124-171)/(35-106) 142/62 (05/31 0600) SpO2:  [83 %-99 %] 96 % (05/31 0600) FiO2 (%):  [30 %-40 %] 30 % (05/31 0448) Last BM Date: 10/17/20  Weight change: Filed Weights   10/13/20 0339 10/14/20 0419 10/17/20 0500  Weight: 80 kg 80 kg 85.6 kg    Intake/Output:   Intake/Output Summary (Last 24 hours) at 10/18/2020 6283 Last data filed at 10/18/2020 0400 Gross per 24 hour  Intake 2005.23 ml  Output 3250 ml  Net -1244.77 ml    PHYSICAL EXAM: General:  Chronically ill appearing. Awake. Agitated. Not following commands.  HEENT: normal Neck: supple. + trach. Carotids 2+ bilat; no bruits. No lymphadenopathy or thryomegaly appreciated. Cor: PMI nondisplaced. Regular rate & rhythm. No rubs, gallops or murmurs. Lungs: clear Abdomen: soft, nontender, nondistended. No hepatosplenomegaly. No bruits or masses. Good bowel sounds. + large hernia   Extremities: no cyanosis, clubbing, rash, tr edema Neuro: as above   Telemetry: Sinus 70-80s Personally reviewed   Labs: Basic Metabolic Panel: Recent Labs  Lab 10/14/20 1301 10/15/20 0345 10/16/20 0312 10/17/20 0530 10/18/20 0420  NA 151* 151* 145 139 142  K 4.8 4.6 4.5 4.4 4.5  CL 114* 116* 110 104 105  CO2 $Re'28 28 27 25 27  'Uhm$ GLUCOSE 123* 149* 185* 117* 92  BUN 135* 145* 144* 151* 159*  CREATININE 2.62* 2.73* 2.80* 2.89* 3.05*  CALCIUM 8.4* 8.5* 8.1* 8.3* 8.3*    Liver Function Tests: No results for input(s): AST, ALT, ALKPHOS, BILITOT, PROT, ALBUMIN in the last 168 hours. No results for input(s): LIPASE, AMYLASE in the last 168 hours. No results for input(s): AMMONIA in the last 168 hours.  CBC: Recent Labs  Lab 10/13/20 0316 10/14/20 0456 10/15/20 0345 10/16/20 0312 10/18/20 0420  WBC 10.7* 10.8* 11.1* 15.1* 13.8*  HGB 8.2* 7.8* 7.2* 8.2* 7.4*  HCT 26.7* 26.6* 24.7* 27.5* 24.8*  MCV 90.5 92.7 94.6 92.3 91.9  PLT 325 348 353 345 336    Cardiac Enzymes: No results for input(s): CKTOTAL, CKMB, CKMBINDEX, TROPONINI in the last 168 hours.  BNP: BNP (last 3 results) Recent Labs    09/23/20 0311  BNP 1,191.8*    ProBNP (last 3 results) No results for input(s): PROBNP in the last 8760 hours.    Other results:  Imaging: DG Chest 1 View  Result Date: 10/17/2020 CLINICAL DATA:  Follow-up patient with tracheostomy tube. Respiratory distress. EXAM: CHEST  1 VIEW COMPARISON:  10/10/2020 and older studies. FINDINGS: Tracheostomy tube is well position. Enteric tube passes below the diaphragm and below the included field of view. Nasal/orogastric tube present on the prior exam has been removed. Stable changes from prior CABG surgery. Cardiac silhouette mildly enlarged. No mediastinal or hilar masses. Bilateral interstitial thickening. Bilateral lung base opacities obscuring the right and mostly obscuring the left hemidiaphragm is consistent with pleural effusions  and dependent atelectasis. Lung base opacities have improved compared to the prior study. No pneumothorax. IMPRESSION: 1. Improved basilar opacities since the prior study, consistent with a decrease in pleural effusions and atelectasis. 2. Persistent bilateral interstitial thickening, presumed chronic. No new lung abnormalities. 3. Well-positioned tracheostomy tube.  Stable enteric tube. Electronically Signed   By: Lajean Manes M.D.   On: 10/17/2020 09:24   US RENAL  Result Date: 10/17/2020 CLINICAL DATA:  Acute kidney injury EXAM: RENAL / URINARY TRACT ULTRASOUND COMPLETE COMPARISON:  CT abdomen/pelvis dated 12/03/2017 FINDINGS: Right Kidney: Renal measurements: 10.6 x 4.8 x 5.0 cm = volume: 132 mL. Echogenicity within normal limits. No mass or hydronephrosis visualized. Left Kidney: Renal measurements: 11.1 x 5.9 x 5.7 cm = volume: 193 mL. Echogenicity within normal limits. 5 mm interpolar calculus. Mild hydronephrosis. Bladder: Appears normal for degree of bladder distention. Other: None. IMPRESSION: 5 mm interpolar left renal calculus with mild left hydronephrosis. Electronically Signed   By: Julian Hy M.D.   On: 10/17/2020 10:18   DG Chest Port 1 View  Result Date: 10/18/2020 CLINICAL DATA:  80 year old male with respiratory failure. Tracheostomy. EXAM: PORTABLE CHEST 1 VIEW COMPARISON:  Portable chest 10/17/2020 and earlier. FINDINGS: Portable AP semi upright view at 0431 hours. Stable mild patient rotation to the right. Tracheostomy tube appears stable. Enteric feeding tube courses to the abdomen, tip not included. There is a new battery or generator device projecting at the right thoracic inlet of unclear etiology and significance. Large lung volumes. Stable cardiac size and mediastinal contours. Prior CABG. Asymmetric increased reticular opacity in the left lung with pleural thickening and/or small pleural effusion on that side. Mild contralateral right pleural thickening suspected but  otherwise when allowing for portable technique the right lung is clear. Right lung ventilation has improved since 10/10/2020. Ventilation is stable from 10/17/2020. Previous right humerus ORIF.  Stable visualized osseous structures. IMPRESSION: 1. Stable lines and tubes. 2. Stable ventilation since yesterday with possible left lung infection in the form of asymmetric reticular opacity and pleural thickening or small left effusion. Electronically Signed   By: Genevie Ann M.D.   On: 10/18/2020 05:23     Medications:     Scheduled Medications: . amiodarone  200 mg Per Tube Daily  . aspirin  324 mg Per Tube Daily  . atorvastatin  80 mg Per Tube Daily  . chlorhexidine gluconate (MEDLINE KIT)  15 mL Mouth Rinse BID  . Chlorhexidine Gluconate Cloth  6 each Topical Daily  . clopidogrel  75 mg Per Tube Daily  . feeding supplement (PIVOT 1.5 CAL)  1,000 mL Per Tube Q24H  . feeding supplement (PROSource TF)  45 mL Per Tube BID  . folic acid  1 mg Per Tube Daily  . heparin injection (subcutaneous)  5,000 Units Subcutaneous Q8H  . hydrALAZINE  37.5 mg Per Tube Q8H  . insulin aspart  0-24 Units Subcutaneous Q4H  . insulin aspart  6 Units Subcutaneous Q4H  . insulin glargine  5  Units Subcutaneous BID  . isosorbide dinitrate  10 mg Per Tube TID  . mouth rinse  15 mL Mouth Rinse 10 times per day  . pantoprazole sodium  40 mg Per Tube Daily  . polyethylene glycol  17 g Per Tube Daily  . QUEtiapine  50 mg Per Tube BID  . rOPINIRole  0.5 mg Per Tube QHS  . thiamine  100 mg Per Tube Daily  . valproic acid  250 mg Per Tube BID    Infusions: . lactated ringers Stopped (10/01/20 0607)    PRN Medications: acetaminophen (TYLENOL) oral liquid 160 mg/5 mL, albuterol, fentaNYL (SUBLIMAZE) injection, Gerhardt's butt cream, lip balm, metoprolol tartrate, ondansetron (ZOFRAN) IV, oxyCODONE, sodium chloride flush, sodium chloride flush, sorbitol   Assessment/Plan:   1. 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  - Co-ox 73%  - Have been holding diuretics due to AKI. Got one dose of IV lasix yesterday.  - No ACE/ARB/ARNI/MRA with AKI. No b-blocker yet with shock.  - c/w hydral/nitrates for afterload reduction BP ok   2. NSTEMI with critical CAD - 95% LM and high grade ostial RCA - CABG on 5/6 for critical LM disease (LIMA -> LAD, SVG -> OM, SVG -> RCA) - Continue ASA + Plavix   - Continue statin.  - No s/s angina  3. Acute hypoxic respiratory failure due to pulmonary edema - on vent through trach  - Possible aspiration 5/8 & 5/10. Antibiotics completed 5/16 - Failing vent wean due to agitation and weakness.  - Trach changed 5/30 - CCM managing  4. AKI / Uremia  - due to ATN - SCr now up to 3.1 . - Renal u/s with mild left hydro. Found to have urinary retention yesterday with > 1600 cc out and now Foley in place - BUN ~ 160 - likely contributing to encephalopathy - Will need to get Renal back involved to see if he needs HD. Hopefully renal function may improve with bladder decompression but Scr has been steadily worsening despite holding diuretics and giving IVF  5. Ventral Hernia  - 5/10 NG placed to decompress   6. ID - Finished zosyn for possible aspiration 5/16.  - Blood CX 5/6- NGTD, UC 5/13 NGTD  - Afebrile  7. Anemia - Given 1 unit PRBC 5/19. - Hgb 7.4 on co-ox  8. Hypernatremia  - Na 142. - FW and AVF stopped 5/30  9. Atrial fibrillation - In NSR  - Continue amio 200 mg daily.   10. Acute encephalopathy - remains agitated. - May have component of uremia - Plan as above  11. Dispo - Eventual LTAC once renal status stable   CRITICAL CARE Performed by: Glori Bickers  Total critical care time: 35 minutes  Critical care time was exclusive of separately billable procedures and treating other  patients.  Critical care was necessary to treat or prevent imminent or life-threatening deterioration.  Critical care was time spent personally by me (independent of midlevel providers or residents) on the following activities: development of treatment plan with patient and/or surrogate as well as nursing, discussions with consultants, evaluation of patient's response to treatment, examination of patient, obtaining history from patient or surrogate, ordering and performing treatments and interventions, ordering and review of laboratory studies, ordering and review of radiographic studies, pulse oximetry and re-evaluation of patient's  condition.    Length of Stay: 25   Glori Bickers, MD  6:23 AM

## 2020-10-18 NOTE — Progress Notes (Signed)
Greater than 999 ml in bladder with scan Bladder scan. urine retrieved with I &O cath. Prima-fit replaced. Will continue to bladder scan and watch urine output.

## 2020-10-19 DIAGNOSIS — I5021 Acute systolic (congestive) heart failure: Secondary | ICD-10-CM | POA: Diagnosis not present

## 2020-10-19 DIAGNOSIS — I2101 ST elevation (STEMI) myocardial infarction involving left main coronary artery: Secondary | ICD-10-CM | POA: Diagnosis not present

## 2020-10-19 DIAGNOSIS — Z951 Presence of aortocoronary bypass graft: Secondary | ICD-10-CM | POA: Diagnosis not present

## 2020-10-19 LAB — CBC
HCT: 24.5 % — ABNORMAL LOW (ref 39.0–52.0)
Hemoglobin: 7.4 g/dL — ABNORMAL LOW (ref 13.0–17.0)
MCH: 27.7 pg (ref 26.0–34.0)
MCHC: 30.2 g/dL (ref 30.0–36.0)
MCV: 91.8 fL (ref 80.0–100.0)
Platelets: 320 10*3/uL (ref 150–400)
RBC: 2.67 MIL/uL — ABNORMAL LOW (ref 4.22–5.81)
RDW: 18.1 % — ABNORMAL HIGH (ref 11.5–15.5)
WBC: 11.1 10*3/uL — ABNORMAL HIGH (ref 4.0–10.5)
nRBC: 0 % (ref 0.0–0.2)

## 2020-10-19 LAB — GLUCOSE, CAPILLARY
Glucose-Capillary: 143 mg/dL — ABNORMAL HIGH (ref 70–99)
Glucose-Capillary: 149 mg/dL — ABNORMAL HIGH (ref 70–99)
Glucose-Capillary: 130 mg/dL — ABNORMAL HIGH (ref 70–99)
Glucose-Capillary: 134 mg/dL — ABNORMAL HIGH (ref 70–99)
Glucose-Capillary: 133 mg/dL — ABNORMAL HIGH (ref 70–99)

## 2020-10-19 LAB — RENAL FUNCTION PANEL
Albumin: 1.6 g/dL — ABNORMAL LOW (ref 3.5–5.0)
Anion gap: 10 (ref 5–15)
BUN: 147 mg/dL — ABNORMAL HIGH (ref 8–23)
CO2: 28 mmol/L (ref 22–32)
Calcium: 8.5 mg/dL — ABNORMAL LOW (ref 8.9–10.3)
Chloride: 109 mmol/L (ref 98–111)
Creatinine, Ser: 2.61 mg/dL — ABNORMAL HIGH (ref 0.61–1.24)
GFR, Estimated: 24 mL/min — ABNORMAL LOW (ref >60)
Glucose, Bld: 149 mg/dL — ABNORMAL HIGH (ref 70–99)
Phosphorus: 4.1 mg/dL (ref 2.5–4.6)
Potassium: 4.3 mmol/L (ref 3.5–5.1)
Sodium: 147 mmol/L — ABNORMAL HIGH (ref 135–145)

## 2020-10-19 LAB — COOXEMETRY PANEL
Carboxyhemoglobin: 1.1 % (ref 0.5–1.5)
Methemoglobin: 0.8 % (ref 0.0–1.5)
O2 Saturation: 76.5 %
Total hemoglobin: 8.8 g/dL — ABNORMAL LOW (ref 12.0–16.0)

## 2020-10-19 MED ORDER — FREE WATER
200.0000 mL | Status: DC
  Administered 2020-10-19 – 2020-10-20 (×5): 200 mL

## 2020-10-19 MED ORDER — OXYCODONE HCL 5 MG/5ML PO SOLN
10.0000 mg | Freq: Four times a day (QID) | ORAL | Status: DC | PRN
  Administered 2020-10-19 – 2020-10-20 (×5): 10 mg
  Filled 2020-10-19 (×5): qty 10

## 2020-10-19 MED ORDER — PIVOT 1.5 CAL PO LIQD
1000.0000 mL | ORAL | Status: DC
  Administered 2020-10-19: 1000 mL

## 2020-10-19 MED ORDER — DOXAZOSIN MESYLATE 4 MG PO TABS
4.0000 mg | ORAL_TABLET | Freq: Every day | ORAL | Status: DC
  Administered 2020-10-19 – 2020-10-20 (×2): 4 mg
  Filled 2020-10-19 (×2): qty 1

## 2020-10-19 NOTE — Progress Notes (Signed)
Patient ID: Jack Vance, male   DOB: 03/23/1941, 79 y.o.   MRN: [id number] [facility] Progress Note   Assessment/ Plan:   1. Acute kidney Injury: With baseline normal renal function and acute injury from ATN/cardiogenic shock with differential for cholesterol emboli also entertained.  Overnight with improving BUN and creatinine following alleviation of bladder outlet obstruction after recognition of urine retention; noted to be polyuric overnight.  He likely would need a longer duration of the Foley catheter at this time.  No indications for dialysis. 2.  Non-ST elevation MI status post emergent four-vessel CABG on 5/6.  Suffered cardiogenic shock from which he appears to have recovered with intermittent diuretic dosing. 3.  Acute hypoxic respiratory failure: Secondary to aspiration pneumonia and pulmonary edema/cardiogenic shock and now on ventilator via tracheostomy 4.  Anemia: Secondary to critical illness, check iron studies and decide on need for supplementation versus ESA.  No indication for PRBC transfusion.  Subjective:   He does not voice any complaints when asked-appears to maintain eye contact.   Objective:   BP (!) 155/59   Pulse 69   Temp 97.9 F (36.6 C) (Oral)   Resp 20   Ht 5' 6" (1.676 m)   Wt 80.9 kg   SpO2 94%   BMI 28.79 kg/m   Intake/Output Summary (Last 24 hours) at 10/19/2020 0807 Last data filed at 10/19/2020 0700 Gross per 24 hour  Intake 830 ml  Output 5200 ml  Net -4370 ml   Weight change: -2.7 kg  Physical Exam: Gen: Awake and tracking people around the room. CVS: Pulse regular rhythm, normal rate, S1 and S2 normal Resp: Coarse/transmitted breath sounds bilaterally without distinct rales/wheeze.  Tracheostomy in situ Abd: Soft, obese, nontender, bowel sounds normal Ext: No lower extremity edema  Imaging: DG Chest 1 View  Result Date: 10/17/2020 CLINICAL DATA:  Follow-up patient with tracheostomy tube. Respiratory distress. EXAM:  CHEST  1 VIEW COMPARISON:  10/10/2020 and older studies. FINDINGS: Tracheostomy tube is well position. Enteric tube passes below the diaphragm and below the included field of view. Nasal/orogastric tube present on the prior exam has been removed. Stable changes from prior CABG surgery. Cardiac silhouette mildly enlarged. No mediastinal or hilar masses. Bilateral interstitial thickening. Bilateral lung base opacities obscuring the right and mostly obscuring the left hemidiaphragm is consistent with pleural effusions and dependent atelectasis. Lung base opacities have improved compared to the prior study. No pneumothorax. IMPRESSION: 1. Improved basilar opacities since the prior study, consistent with a decrease in pleural effusions and atelectasis. 2. Persistent bilateral interstitial thickening, presumed chronic. No new lung abnormalities. 3. Well-positioned tracheostomy tube.  Stable enteric tube. Electronically Signed   By: David  Ormond M.D.   On: 10/17/2020 09:24   US RENAL  Result Date: 10/17/2020 CLINICAL DATA:  Acute kidney injury EXAM: RENAL / URINARY TRACT ULTRASOUND COMPLETE COMPARISON:  CT abdomen/pelvis dated 12/03/2017 FINDINGS: Right Kidney: Renal measurements: 10.6 x 4.8 x 5.0 cm = volume: 132 mL. Echogenicity within normal limits. No mass or hydronephrosis visualized. Left Kidney: Renal measurements: 11.1 x 5.9 x 5.7 cm = volume: 193 mL. Echogenicity within normal limits. 5 mm interpolar calculus. Mild hydronephrosis. Bladder: Appears normal for degree of bladder distention. Other: None. IMPRESSION: 5 mm interpolar left renal calculus with mild left hydronephrosis. Electronically Signed   By: Sriyesh  Krishnan M.D.   On: 10/17/2020 10:18   DG Chest Port 1 View  Result Date: 10/18/2020 CLINICAL DATA:  79-year-old male with respiratory failure. Tracheostomy.   EXAM: PORTABLE CHEST 1 VIEW COMPARISON:  Portable chest 10/17/2020 and earlier. FINDINGS: Portable AP semi upright view at 0431 hours.  Stable mild patient rotation to the right. Tracheostomy tube appears stable. Enteric feeding tube courses to the abdomen, tip not included. There is a new battery or generator device projecting at the right thoracic inlet of unclear etiology and significance. Large lung volumes. Stable cardiac size and mediastinal contours. Prior CABG. Asymmetric increased reticular opacity in the left lung with pleural thickening and/or small pleural effusion on that side. Mild contralateral right pleural thickening suspected but otherwise when allowing for portable technique the right lung is clear. Right lung ventilation has improved since 10/10/2020. Ventilation is stable from 10/17/2020. Previous right humerus ORIF.  Stable visualized osseous structures. IMPRESSION: 1. Stable lines and tubes. 2. Stable ventilation since yesterday with possible left lung infection in the form of asymmetric reticular opacity and pleural thickening or small left effusion. Electronically Signed   By: H  Hall M.D.   On: 10/18/2020 05:23    Labs: BMET Recent Labs  Lab 10/14/20 0456 10/14/20 1301 10/15/20 0345 10/16/20 0312 10/17/20 0530 10/18/20 0420 10/19/20 0447  NA 151* 151* 151* 145 139 142 147*  K 4.8 4.8 4.6 4.5 4.4 4.5 4.3  CL 115* 114* 116* 110 104 105 109  CO2 29 28 28 27 25 27 28  GLUCOSE 112* 123* 149* 185* 117* 92 149*  BUN 126* 135* 145* 144* 151* 159* 147*  CREATININE 2.43* 2.62* 2.73* 2.80* 2.89* 3.05* 2.61*  CALCIUM 8.5* 8.4* 8.5* 8.1* 8.3* 8.3* 8.5*  PHOS  --   --   --   --   --   --  4.1   CBC Recent Labs  Lab 10/15/20 0345 10/16/20 0312 10/18/20 0420 10/19/20 0447  WBC 11.1* 15.1* 13.8* 11.1*  HGB 7.2* 8.2* 7.4* 7.4*  HCT 24.7* 27.5* 24.8* 24.5*  MCV 94.6 92.3 91.9 91.8  PLT 353 345 336 320    Medications:    . amiodarone  200 mg Per Tube Daily  . aspirin  81 mg Per Tube Daily  . atorvastatin  80 mg Per Tube Daily  . chlorhexidine gluconate (MEDLINE KIT)  15 mL Mouth Rinse BID  .  Chlorhexidine Gluconate Cloth  6 each Topical Daily  . clopidogrel  75 mg Per Tube Daily  . feeding supplement (PIVOT 1.5 CAL)  1,000 mL Per Tube Q24H  . feeding supplement (PROSource TF)  45 mL Per Tube BID  . folic acid  1 mg Per Tube Daily  . free water  100 mL Per Tube Q4H  . heparin injection (subcutaneous)  5,000 Units Subcutaneous Q8H  . hydrALAZINE  37.5 mg Per Tube Q8H  . insulin aspart  0-24 Units Subcutaneous Q4H  . insulin glargine  5 Units Subcutaneous BID  . isosorbide dinitrate  10 mg Per Tube TID  . mouth rinse  15 mL Mouth Rinse 10 times per day  . pantoprazole sodium  40 mg Per Tube Daily  . polyethylene glycol  17 g Per Tube Daily  . QUEtiapine  50 mg Per Tube BID  . rOPINIRole  0.5 mg Per Tube QHS  . thiamine  100 mg Per Tube Daily  . valproic acid  250 mg Per Tube BID    , MD 10/19/2020, 8:07 AM   

## 2020-10-19 NOTE — Progress Notes (Signed)
NAME:  Jack Vance, MRN:  [id number], DOB:  1940-11-16, LOS: 26 ADMISSION DATE:  09/23/2020, CONSULTATION DATE:  09/24/2019 REFERRING MD:  Jacinto Halim, CHIEF COMPLAINT:  Acute coronary syndrome   History of Present Illness:  80 year old M who presented 5/6 with cardiogenic shock secondary to NSTEMI.  Presented with new onset RSCP and severe dyspnea. Found to have acute pulmonary edema and heart failure; intubated and sent for urgent LHC which revealed 3V CAD.  IABP inserted and patient underwent urgent CABG. IABP removed 5/9. Continues to require full mechanical ventilation support.  Pertinent  Medical History  Arthritis  Asthma  DDD  Abdominal Wall Hernia  RLS  Significant Hospital Events: Including procedures, antibiotic start and stop dates in addition to other pertinent events    . 5/6 CABG with Dr Cliffton Asters with LIMA to LAD, SVG to RCA d and SVG to ramus. LV normal and returned to ICU with no vasopressor support. No post-operative bleeding but poor RV performance on hemodynamics.  . 5/8 significant hemodynamic instability with labile blood pressure and vasopressor requirements yesterday. Episode of emesis but no evidence of bowel obstruction.  Evidence of pericardial tamponade. . 5/9 improved hemodynamics, able now to tolerate balloon pump weaning and removal. . 5/11 Stable hemodynamics, continuing diuresis. Improved UOP. TPN consult for initiation 5/12 in the setting of ileus, prolonged NPO status.  . 5/13 sedated, diuresis, milrinone, PICC line today, CVL removed  . 5/14 tolerated short SBT/SAT on Precedex . 5/16 Not tolerating SBT due to significant tachypnea to 50s, placed back on PRVC. Marland Kitchen 5/17 Improved SBT toleration, weaning milrinone, minimizing sedation. Bronched w/ c/f mucus plugging, airways clear, ETT exchanged due to significant buildup. Diuresis held. . 5/18 More agitated overnight requiring increased sedation with addition of Fentanyl/Versed, Seroquel started. BUN continues  to rise. Nephro consult.   5/19 Continues to be agitated overnight requiring increased sedation with fentanyl/versed. Started Klonopin, Seroquel and Requip yesterday. Started on valproate and seroquel increased.  5/20 Patient more interactive following commands and maintaining eyes open and tracking.  5/21 failing SBT's.  Plan for trach next week  5/23 Trach (BI/DS)  5/25 NGT removed, cortrack in place for TF   5/26 pressure support trial  5/27 worsening kideny function   5/29 serum creatinine stable  5/30 cuff leak issues: trach exchanged for distal XLT  5/31 having urinary retention issues, foley replaced  6/01 PSV wean   Interim History / Subjective:  Afebrile / WBC 11.1  On PSV wean 10/5, 30% Glucose range 143-149  I/O 4.9L UOP, -4.3L in last 24 hours  RN reports no acute events > pt very active in bed, no further bloody secretions from trach  Objective   Blood pressure (!) 152/44, pulse 69, temperature 97.9 F (36.6 C), temperature source Oral, resp. rate (!) 23, height 5\' 6"  (1.676 m), weight 80.9 kg, SpO2 96 %.    Vent Mode: PSV FiO2 (%):  [30 %] 30 % Set Rate:  [22 bmp] 22 bmp Vt Set:  [560 mL] 560 mL PEEP:  [5 cmH20] 5 cmH20 Pressure Support:  [10 cmH20] 10 cmH20 Plateau Pressure:  [23 cmH20-32 cmH20] 24 cmH20   Intake/Output Summary (Last 24 hours) at 10/19/2020 0932 Last data filed at 10/19/2020 0800 Gross per 24 hour  Intake 830 ml  Output 4275 ml  Net -3445 ml   Filed Weights   10/17/20 0500 10/18/20 0600 10/19/20 0500  Weight: 85.6 kg 83.6 kg 80.9 kg   Physical Examination: BP (!) 152/44  Pulse 69   Temp 97.9 F (36.6 C) (Oral)   Resp (!) 23   Ht 5\' 6"  (1.676 m)   Wt 80.9 kg   SpO2 96%   BMI 28.79 kg/m    General: ill appearing adult male lying in bed in NAD on vent, active / kicking legs, trying to turn in bed  HEENT: MM pink/moist, #6 distal XLT trach midline c/d/i, anicteric  Neuro: Awake, alert, follows simple commands, HOH, MAE CV:  s1s2 RRR, no m/r/g PULM: non-labored on vent, PSV, lungs bilaterally coarse with occasional rhonchi GI: soft, bsx4 active, right side abdominal hernia soft Extremities: warm/dry, no edema  Skin: no rashes or lesions   Labs/imaging that I have personally reviewed: (right click and "Reselect all SmartList Selections" daily)  CBC - WBC 11.1, Hgb 7.4  BMP - Na 147, K 4.3, BUn 147, Sr Cr 2.61  SvO2 76 Co-Ox 76   Resolved Hospital Problem List   NSTEMI Thrombocytopenia Cardiogenic Shock  Gastric Ileus   Assessment & Plan:   Acute hypoxemic hypercapnic respiratory failure requiring mechanical ventilation. Prolonged vent weaning due to NM weakness. Possible aspiration Trach Malfunction - blown cuff, trach exchanged to #6 distal XLT  -PRVC 8cc/kg as rest mode  -push daily PSV wean with goal for ATC -follow intermittent CXR  -pulmonary hygiene as able  -minimize sedating medications to allow for weaning as able   NSTEMI with culprit RCA involvement RV dysfunction following ACS and Cardiopulmonary Bypass Coronary artery disease -continue amiodarone, hydralazine, isorsorbide -DAPT with ASA, plavix  -hold entresto with elevated creatinine  -appreciate CHF SVC input  -tele monitoring   Acute kidney injury Uremia Hypernatremia Urinary Retention -continue free water PT  -continue foley catheter with urinary retention  -Trend BMP / urinary output -Replace electrolytes as indicated -Avoid nephrotoxic agents, ensure adequate renal perfusion  At Risk Malnutrition  -TF per Nutrition  -sorbitol PRN for bowel regimen   Dysphagia -TF as above via cortrak -may need PEG at some point if not able to liberate from vent   Anemia (Baseline ~10) -trend CBC -transfuse for Hgb >7%  Agitated Delirium   H/o ETOH Abuse  -continue seroquel, valproate  -thiamine, folate  RLS -QHS requip   DM  Hypoglycemia -improved off TF coverage  -SSI -lantus 5 units BID   Mobility,  Deconditioning   -continue PT/OT as able    Best Practice: (right click and "Reselect all SmartList Selections" daily)  Diet:  Tube Feed   Pain/Anxiety/Delirium protocol (if indicated): Yes (RASS goal 0) VAP protocol (if indicated): Yes DVT prophylaxis: Subcutaneous Heparin GI prophylaxis: PPI Glucose control:  SSI Yes and Basal insulin Yes, Central venous access:  Yes, and it is still needed Arterial line:  N/A Foley:  Yes, and it is still needed Mobility:  bed rest  PT consulted: yes Last date of multidisciplinary goals of care discussion per primary team  Code Status:  full code Disposition: LTACH pending renal stability/recovery    , MSN, APRN, NP-C, AGACNP-BC [facility] 10/19/2020, 9:48 AM   Please see [email or web address] for pager details.   From 7A-7P if no response, please call [phone] After hours, please call ELink [phone]

## 2020-10-19 NOTE — Progress Notes (Signed)
[address]       [place] [address]             [phone]                 26 Days Post-Op Procedure(s) (LRB): CORONARY ARTERY BYPASS GRAFTING (CABG)  TIMES 4 USING LEFT GREATER SAPHENOUS VEIN HARVESTED ENDOSCOPICALLY AND LEFT INTERNAL MAMMARY ARTERY (N/A) TRANSESOPHAGEAL ECHOCARDIOGRAM (TEE) (N/A)   Events: No events  _______________________________________________________________ Vitals: BP (!) 152/44   Pulse 69   Temp 97.9 F (36.6 C) (Oral)   Resp (!) 23   Ht 5\' 6"  (1.676 m)   Wt 80.9 kg   SpO2 96%   BMI 28.79 kg/m   - Neuro:sedated  - Cardiovascular: Normal sinus rhythm.  I  Drips:  none    - Pulm:  Vent Mode: PSV FiO2 (%):  [30 %] 30 % Set Rate:  [22 bmp] 22 bmp Vt Set:  [560 mL] 560 mL PEEP:  [5 cmH20] 5 cmH20 Pressure Support:  [10 cmH20] 10 cmH20 Plateau Pressure:  [23 cmH20-32 cmH20] 24 cmH20  ABG    Component Value Date/Time   PHART 7.410 09/26/2020 0539   PCO2ART 31.1 (L) 09/26/2020 0539   PO2ART 136 (H) 09/26/2020 0539   HCO3 19.9 (L) 09/26/2020 0539   TCO2 21 (L) 09/26/2020 0539   ACIDBASEDEF 4.0 (H) 09/26/2020 0539   O2SAT 76.5 10/19/2020 0449    - Abd: distended, but soft - Extremity: Warm  .Intake/Output      05/31 0701 06/01 0700 06/01 0701 06/02 0700   P.O.     I.V. (mL/kg)     Other 50    NG/GT 840    Total Intake(mL/kg) 890 (11)    Urine (mL/kg/hr) 4900 (2.5) 325 (2.5)   Stool 300    Total Output 5200 325   Net -4310 -325           _______________________________________________________________ Labs: CBC Latest Ref Rng & Units 10/19/2020 10/18/2020 10/16/2020  WBC 4.0 - 10.5 K/uL 11.1(H) 13.8(H) 15.1(H)  Hemoglobin 13.0 - 17.0 g/dL 7.4(L) 7.4(L) 8.2(L)  Hematocrit 39.0 - 52.0 % 24.5(L) 24.8(L) 27.5(L)  Platelets 150 - 400 K/uL 320 336 345   CMP Latest Ref Rng & Units 10/19/2020 10/18/2020 10/17/2020  Glucose 70 - 99 mg/dL 10/19/2020) 92 240(X)  BUN 8 - 23 mg/dL 735(H) 299(M) 426(S)  Creatinine  0.61 - 1.24 mg/dL 341(D) 6.22(W) 9.79(G)  Sodium 135 - 145 mmol/L 147(H) 142 139  Potassium 3.5 - 5.1 mmol/L 4.3 4.5 4.4  Chloride 98 - 111 mmol/L 109 105 104  CO2 22 - 32 mmol/L 28 27 25   Calcium 8.9 - 10.3 mg/dL 9.21(J) 8.3(L) 8.3(L)  Total Protein 6.5 - 8.1 g/dL - - -  Total Bilirubin 0.3 - 1.2 mg/dL - - -  Alkaline Phos 38 - 126 U/L - - -  AST 15 - 41 U/L - - -  ALT 0 - 44 U/L - - -    CXR: -  _______________________________________________________________  Assessment and Plan: POD 26 s/p emergency CABG.  Neuro: agitation improving CV:  Off milr.   Pulm: wean vent as tolerated Renal: making urine now.  Foley in place.  Creat trending down.  AKI likely post-obstructive.  Will start flomax, but no plans to remove foley prior to transfer GI: on goal tube feeds. Heme: Stable .   Endo: Sliding scale insulin. Dispo: Continue ICU care. dispo planning now that creat trending down Jack Vance  O Jack Vance 10/19/2020 8:39 AM

## 2020-10-19 NOTE — Plan of Care (Signed)
  Problem: Cardiac: Goal: Will achieve and/or maintain hemodynamic stability Outcome: Progressing   Problem: Respiratory: Goal: Respiratory status will improve Outcome: Not Progressing   Problem: Urinary Elimination: Goal: Ability to achieve and maintain adequate renal perfusion and functioning will improve Outcome: Progressing   Problem: Nutrition: Goal: Adequate nutrition will be maintained Outcome: Progressing   Problem: Coping: Goal: Level of anxiety will decrease Outcome: Progressing   Problem: Pain Managment: Goal: General experience of comfort will improve Outcome: Progressing

## 2020-10-19 NOTE — Progress Notes (Signed)
[facility] BRIEF SICU PROGRESS NOTE  26 Days Post-Op  S/P Procedure(s) (LRB): CORONARY ARTERY BYPASS GRAFTING (CABG)  TIMES 4 USING LEFT GREATER SAPHENOUS VEIN HARVESTED ENDOSCOPICALLY AND LEFT INTERNAL MAMMARY ARTERY (N/A) TRANSESOPHAGEAL ECHOCARDIOGRAM (TEE) (N/A)   Stable day  Plan: Continue current plan  Purcell Nails, MD 10/19/2020 5:39 PM

## 2020-10-19 NOTE — Progress Notes (Addendum)
Patient ID: Jack Vance, male   DOB: 04/19/1941, 80 y.o.   MRN: [id number]    Advanced Heart Failure Rounding Note   Subjective:    Underwent emergent CABG on 5/6 for critical LM disease (LIMA -> LAD, SVG -> OM, SVG -> RCA) 5/6 Cultures negative.  5/8 Possible aspiration. A fib --> started amio drip.  Given 2u PRBCs. Hypotensive in the evening and started on vasopressin + antibiotics.  5/9 IABP removed 5/10 NG placed >1.5 out  5/11 started neostigmime 5/12 Febrile 102 5/17 Failed SBT. Underwent bronchoscopy for attempted therapeutic aspiration for mucus plugging. ETT exchanged.  5/19 Hgb 6.1 given 1UPRBC 5/21  Failed SBT  5/23 Trach.   On vent through trach. Remains agitated. Will follow occasional commands  Renal u/s with mild L hydro due to urine retention. Now w/ foley. Good UOP w/ 5L out yesterday. SCr 3.05>>2.61. BUN 159>>147. Nephrology back on board.     Wt down 6 lb. CVP 6-7   Objective:   Weight Range:  Vital Signs:   Temp:  [97.5 F (36.4 C)-98.5 F (36.9 C)] 97.9 F (36.6 C) (06/01 0736) Pulse Rate:  [69-92] 69 (06/01 0305) Resp:  [11-29] 20 (06/01 0747) BP: (124-172)/(52-76) 155/59 (06/01 0700) SpO2:  [94 %-99 %] 94 % (06/01 0700) FiO2 (%):  [30 %] 30 % (06/01 0747) Weight:  [80.9 kg] 80.9 kg (06/01 0500) Last BM Date: 10/17/20  Weight change: Filed Weights   10/17/20 0500 10/18/20 0600 10/19/20 0500  Weight: 85.6 kg 83.6 kg 80.9 kg    Intake/Output:   Intake/Output Summary (Last 24 hours) at 10/19/2020 0751 Last data filed at 10/19/2020 0700 Gross per 24 hour  Intake 890 ml  Output 5200 ml  Net -4310 ml    PHYSICAL EXAM: CVP 6-7 General:  Chronically ill appearing WM w/ trach.  No distress. Awake but slightly agitated. Not following commands HEENT: normal Neck: supple. + TC. Carotids 2+ bilat; no bruits. No lymphadenopathy or thryomegaly appreciated. Cor: PMI nondisplaced. Regular rate & rhythm. 2/6 murmur LLSB Lungs: clear Abdomen: soft,  nontender, nondistended. No hepatosplenomegaly. No bruits or masses. Good bowel sounds. + large hernia  Extremities: no cyanosis, clubbing, rash, trace LE edema Neuro: as above   Telemetry: Sinus 80-90s Personally reviewed   Labs: Basic Metabolic Panel: Recent Labs  Lab 10/15/20 0345 10/16/20 0312 10/17/20 0530 10/18/20 0420 10/19/20 0447  NA 151* 145 139 142 147*  K 4.6 4.5 4.4 4.5 4.3  CL 116* 110 104 105 109  CO2 $Re'28 27 25 27 28  'XTE$ GLUCOSE 149* 185* 117* 92 149*  BUN 145* 144* 151* 159* 147*  CREATININE 2.73* 2.80* 2.89* 3.05* 2.61*  CALCIUM 8.5* 8.1* 8.3* 8.3* 8.5*  PHOS  --   --   --   --  4.1    Liver Function Tests: Recent Labs  Lab 10/19/20 0447  ALBUMIN 1.6*   No results for input(s): LIPASE, AMYLASE in the last 168 hours. No results for input(s): AMMONIA in the last 168 hours.  CBC: Recent Labs  Lab 10/14/20 0456 10/15/20 0345 10/16/20 0312 10/18/20 0420 10/19/20 0447  WBC 10.8* 11.1* 15.1* 13.8* 11.1*  HGB 7.8* 7.2* 8.2* 7.4* 7.4*  HCT 26.6* 24.7* 27.5* 24.8* 24.5*  MCV 92.7 94.6 92.3 91.9 91.8  PLT 348 353 345 336 320    Cardiac Enzymes: No results for input(s): CKTOTAL, CKMB, CKMBINDEX, TROPONINI in the last 168 hours.  BNP: BNP (last 3 results) Recent Labs    09/23/20 0311  BNP 1,191.8*    ProBNP (last 3 results) No results for input(s): PROBNP in the last 8760 hours.    Other results:  Imaging: DG Chest 1 View  Result Date: 10/17/2020 CLINICAL DATA:  Follow-up patient with tracheostomy tube. Respiratory distress. EXAM: CHEST  1 VIEW COMPARISON:  10/10/2020 and older studies. FINDINGS: Tracheostomy tube is well position. Enteric tube passes below the diaphragm and below the included field of view. Nasal/orogastric tube present on the prior exam has been removed. Stable changes from prior CABG surgery. Cardiac silhouette mildly enlarged. No mediastinal or hilar masses. Bilateral interstitial thickening. Bilateral lung base opacities  obscuring the right and mostly obscuring the left hemidiaphragm is consistent with pleural effusions and dependent atelectasis. Lung base opacities have improved compared to the prior study. No pneumothorax. IMPRESSION: 1. Improved basilar opacities since the prior study, consistent with a decrease in pleural effusions and atelectasis. 2. Persistent bilateral interstitial thickening, presumed chronic. No new lung abnormalities. 3. Well-positioned tracheostomy tube.  Stable enteric tube. Electronically Signed   By: Lajean Manes M.D.   On: 10/17/2020 09:24   US RENAL  Result Date: 10/17/2020 CLINICAL DATA:  Acute kidney injury EXAM: RENAL / URINARY TRACT ULTRASOUND COMPLETE COMPARISON:  CT abdomen/pelvis dated 12/03/2017 FINDINGS: Right Kidney: Renal measurements: 10.6 x 4.8 x 5.0 cm = volume: 132 mL. Echogenicity within normal limits. No mass or hydronephrosis visualized. Left Kidney: Renal measurements: 11.1 x 5.9 x 5.7 cm = volume: 193 mL. Echogenicity within normal limits. 5 mm interpolar calculus. Mild hydronephrosis. Bladder: Appears normal for degree of bladder distention. Other: None. IMPRESSION: 5 mm interpolar left renal calculus with mild left hydronephrosis. Electronically Signed   By: Julian Hy M.D.   On: 10/17/2020 10:18   DG Chest Port 1 View  Result Date: 10/18/2020 CLINICAL DATA:  80 year old male with respiratory failure. Tracheostomy. EXAM: PORTABLE CHEST 1 VIEW COMPARISON:  Portable chest 10/17/2020 and earlier. FINDINGS: Portable AP semi upright view at 0431 hours. Stable mild patient rotation to the right. Tracheostomy tube appears stable. Enteric feeding tube courses to the abdomen, tip not included. There is a new battery or generator device projecting at the right thoracic inlet of unclear etiology and significance. Large lung volumes. Stable cardiac size and mediastinal contours. Prior CABG. Asymmetric increased reticular opacity in the left lung with pleural thickening  and/or small pleural effusion on that side. Mild contralateral right pleural thickening suspected but otherwise when allowing for portable technique the right lung is clear. Right lung ventilation has improved since 10/10/2020. Ventilation is stable from 10/17/2020. Previous right humerus ORIF.  Stable visualized osseous structures. IMPRESSION: 1. Stable lines and tubes. 2. Stable ventilation since yesterday with possible left lung infection in the form of asymmetric reticular opacity and pleural thickening or small left effusion. Electronically Signed   By: Genevie Ann M.D.   On: 10/18/2020 05:23     Medications:     Scheduled Medications: . amiodarone  200 mg Per Tube Daily  . aspirin  81 mg Per Tube Daily  . atorvastatin  80 mg Per Tube Daily  . chlorhexidine gluconate (MEDLINE KIT)  15 mL Mouth Rinse BID  . Chlorhexidine Gluconate Cloth  6 each Topical Daily  . clopidogrel  75 mg Per Tube Daily  . feeding supplement (PIVOT 1.5 CAL)  1,000 mL Per Tube Q24H  . feeding supplement (PROSource TF)  45 mL Per Tube BID  . folic acid  1 mg Per Tube Daily  . free water  100  mL Per Tube Q4H  . heparin injection (subcutaneous)  5,000 Units Subcutaneous Q8H  . hydrALAZINE  37.5 mg Per Tube Q8H  . insulin aspart  0-24 Units Subcutaneous Q4H  . insulin glargine  5 Units Subcutaneous BID  . isosorbide dinitrate  10 mg Per Tube TID  . mouth rinse  15 mL Mouth Rinse 10 times per day  . pantoprazole sodium  40 mg Per Tube Daily  . polyethylene glycol  17 g Per Tube Daily  . QUEtiapine  50 mg Per Tube BID  . rOPINIRole  0.5 mg Per Tube QHS  . thiamine  100 mg Per Tube Daily  . valproic acid  250 mg Per Tube BID    Infusions: . lactated ringers Stopped (10/01/20 0607)    PRN Medications: acetaminophen (TYLENOL) oral liquid 160 mg/5 mL, albuterol, fentaNYL (SUBLIMAZE) injection, Gerhardt's butt cream, lip balm, metoprolol tartrate, ondansetron (ZOFRAN) IV, oxyCODONE, sodium chloride flush, sodium  chloride flush, sorbitol   Assessment/Plan:   1. 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  - Co-ox 77%  - Have been holding diuretics due to AKI. Volume ok. CVP 6-7 - No ACE/ARB/ARNI/MRA with AKI. No b-blocker yet with shock.  - c/w hydral/nitrates for afterload reduction BP ok   2. NSTEMI with critical CAD - 95% LM and high grade ostial RCA - CABG on 5/6 for critical LM disease (LIMA -> LAD, SVG -> OM, SVG -> RCA) - Continue ASA + Plavix   - Continue statin.  - No s/s angina  3. Acute hypoxic respiratory failure due to pulmonary edema - on vent through trach  - Possible aspiration 5/8 & 5/10. Antibiotics completed 5/16 - Failing vent wean due to agitation and weakness.  - Trach changed 5/30 - CCM managing  4. AKI / Uremia  - due to ATN - Renal u/s with mild left hydro. Found to have urinary retention with > 1600 cc out and now Foley in place - Good UOP yesterday w/ 5L out  - SCr/ BUN down trending.  - Nephrology not convinced that AMS is due to uremic encephalopathy   5. Ventral Hernia  - 5/10 NG placed to decompress   6. ID - Finished zosyn for possible aspiration 5/16.  - Blood CX 5/6- NGTD, UC 5/13 NGTD  - Afebrile  7. Anemia - Given 1 unit PRBC 5/19. - Hgb 7.4 on co-ox  8. Hypernatremia  -Na 147  - free water boluses   9. Atrial fibrillation - In NSR  - Continue amio 200 mg daily.   10. Acute encephalopathy - remains agitated. Nephrology not convinced this is due to uremic encephalopathy - Plan as above - reduced protein load from his tube feeds to limit worsening azotemia.    11. Dispo - Eventual LTAC once renal status stable    Length of Stay: [place], PA-C  7:51 AM   Agree with above.   Remains on vent through trach. Will not follow commands for me this  am. Co-ox stable. Creatinine improving after Foley placed. Serum sodium drifting back up   General:  Chronically ill appearing. On vent through trach No resp difficulty. Wont follow commands HEENT: normal  Neck: supple. no JVD. + trach  Carotids 2+ bilat; no bruits. No lymphadenopathy or thryomegaly appreciated. Cor: PMI nondisplaced. Regular rate &  rhythm. No rubs, gallops or murmurs. Lungs: clear Abdomen: soft, nontender, nondistended. No hepatosplenomegaly. No bruits or masses. Good bowel sounds. + large ventral hernia Extremities: no cyanosis, clubbing, rash, edema Neuro: as above  Remains encephalopathic. On vent via trach. Renal function now improving with decompression.   If renal function continues to improve may be ready for [facility] in next day or two.   Will continue vent wean. Add back FW for hypernatremia.   CRITICAL CARE Performed by: Glori Bickers  Total critical care time: 35 minutes  Critical care time was exclusive of separately billable procedures and treating other patients.  Critical care was necessary to treat or prevent imminent or life-threatening deterioration.  Critical care was time spent personally by me (independent of midlevel providers or residents) on the following activities: development of treatment plan with patient and/or surrogate as well as nursing, discussions with consultants, evaluation of patient's response to treatment, examination of patient, obtaining history from patient or surrogate, ordering and performing treatments and interventions, ordering and review of laboratory studies, ordering and review of radiographic studies, pulse oximetry and re-evaluation of patient's condition.  Glori Bickers, MD  8:36 AM

## 2020-10-19 NOTE — Progress Notes (Signed)
Nutrition Follow-up  DOCUMENTATION CODES:   Not applicable  INTERVENTION:   Tube Feeding via Cortrak:  -Pivot 1.5 @ 65 ml/hr via Cortrak (1440 ml)  Goal rate provides: 2340 kcals, 146 grams protein, 1185 ml free water.    NUTRITION DIAGNOSIS:   Increased nutrient needs related to post-op healing as evidenced by estimated needs.  Being addressed via TF   GOAL:   Patient will meet greater than or equal to 90% of their needs  Met  MONITOR:   Vent status,Labs,I & O's,Skin,TF tolerance,Weight trends  REASON FOR ASSESSMENT:   Consult Enteral/tube feeding initiation and management  ASSESSMENT:   Patient with PMH significant for DDD, asthma, HTN, and large ventral hernia. Presented to [facility] ED with shortness of breath, was found to be in cardiogenic shock with NSTEMI.  Awaiting LTACH  5/06- s/p heart cath, confirmed critical CAD, s/p emergent CABG x3 wih IABP placement 5/08- large vomiting episode, TF held 5/09- s/p IABP removed, trickle TF started  5/10- large vomiting episode, TF held 5/11- Cortrak tube advanced to ligament of Treitz 5/12- TPN started  5/14- Pivot 1.5 restarted at 10 ml/hr  5/16- Pivot 1.5 advanced to 20 ml/hr 5/17- s/p bronch showed c/f mucus plugging, ETT exchanged  5/21- TPN stopped, Pivot 1.5 @ goal rate   5/23- s/p trach 5/31 - urinary retention, foley placed  Pt remains on vent support via trach; noted plan to start TC trials Remains agitated, follows some commands, requiring restraints  Tolerating Pivot 1.5 at 60 ml/hr via Cortrak tube  Hypernatremic, noted free water flush of 200 mL q 4 hours  Weight appears relatively stable lately; current wt 80.9 kg  Labs: sodium 147 (H), Creatinine 2.61, BUN 147 Meds: ss novolog, lantus  Diet Order:   Diet Order            Diet NPO time specified  Diet effective midnight                 EDUCATION NEEDS:   Not appropriate for education at this time  Skin:  Skin Assessment: Skin  Integrity Issues: Skin Integrity Issues:: DTI DTI: buttocks Incisions: bilateral legs, chest  Last BM:  5/31 rectal tube  Height:   Ht Readings from Last 1 Encounters:  10/17/20 $RemoveB'5\' 6"'JDkHBvLV$  (1.676 m)    Weight:   Wt Readings from Last 1 Encounters:  10/19/20 80.9 kg    BMI:  Body mass index is 28.79 kg/m.  Estimated Nutritional Needs:   Kcal:  9326-7124 kcal  Protein:  120-160 g  Fluid:  >/= 2 L/day   Kerman Passey MS, RDN, LDN, CNSC Registered Dietitian III Clinical Nutrition RD Pager and On-Call Pager Number Located in [place]

## 2020-10-19 NOTE — Progress Notes (Signed)
eLink Physician-Brief Progress Note Patient Name: Jack Vance DOB: Feb 21, 1941 MRN: [id number]   Date of Service  10/19/2020  HPI/Events of Note  Agitation - Nursing request to renew restraint orders.   eICU Interventions  Will renew bilateral soft wrist restraints X 8 hours.     Intervention Category Major Interventions: Delirium, psychosis, severe agitation - evaluation and management  [name] 10/19/2020, 12:46 AM

## 2020-10-20 ENCOUNTER — Other Ambulatory Visit (HOSPITAL_COMMUNITY): Payer: Self-pay

## 2020-10-20 ENCOUNTER — Inpatient Hospital Stay
Admission: AD | Admit: 2020-10-20 | Discharge: 2020-11-18 | Disposition: E | Payer: Self-pay | Source: Other Acute Inpatient Hospital | Attending: Internal Medicine | Admitting: Internal Medicine

## 2020-10-20 DIAGNOSIS — N179 Acute kidney failure, unspecified: Secondary | ICD-10-CM | POA: Diagnosis not present

## 2020-10-20 DIAGNOSIS — Z4659 Encounter for fitting and adjustment of other gastrointestinal appliance and device: Secondary | ICD-10-CM

## 2020-10-20 DIAGNOSIS — I5021 Acute systolic (congestive) heart failure: Secondary | ICD-10-CM | POA: Diagnosis not present

## 2020-10-20 DIAGNOSIS — Z452 Encounter for adjustment and management of vascular access device: Secondary | ICD-10-CM

## 2020-10-20 DIAGNOSIS — J9690 Respiratory failure, unspecified, unspecified whether with hypoxia or hypercapnia: Secondary | ICD-10-CM

## 2020-10-20 DIAGNOSIS — Z930 Tracheostomy status: Secondary | ICD-10-CM

## 2020-10-20 LAB — BLOOD GAS, ARTERIAL
Acid-Base Excess: 4.9 mmol/L — ABNORMAL HIGH (ref 0.0–2.0)
Bicarbonate: 29.6 mmol/L — ABNORMAL HIGH (ref 20.0–28.0)
FIO2: 35
O2 Saturation: 95.8 %
Patient temperature: 36.1
pCO2 arterial: 47.5 mmHg (ref 32.0–48.0)
pH, Arterial: 7.406 (ref 7.350–7.450)
pO2, Arterial: 80.7 mmHg — ABNORMAL LOW (ref 83.0–108.0)

## 2020-10-20 LAB — RENAL FUNCTION PANEL
Albumin: 1.6 g/dL — ABNORMAL LOW (ref 3.5–5.0)
Anion gap: 9 (ref 5–15)
BUN: 130 mg/dL — ABNORMAL HIGH (ref 8–23)
CO2: 29 mmol/L (ref 22–32)
Calcium: 8.5 mg/dL — ABNORMAL LOW (ref 8.9–10.3)
Chloride: 112 mmol/L — ABNORMAL HIGH (ref 98–111)
Creatinine, Ser: 2.17 mg/dL — ABNORMAL HIGH (ref 0.61–1.24)
GFR, Estimated: 30 mL/min — ABNORMAL LOW (ref >60)
Glucose, Bld: 151 mg/dL — ABNORMAL HIGH (ref 70–99)
Phosphorus: 4.2 mg/dL (ref 2.5–4.6)
Potassium: 4.4 mmol/L (ref 3.5–5.1)
Sodium: 150 mmol/L — ABNORMAL HIGH (ref 135–145)

## 2020-10-20 LAB — CBC WITH DIFFERENTIAL/PLATELET
Abs Immature Granulocytes: 0.19 10*3/uL — ABNORMAL HIGH (ref 0.00–0.07)
Basophils Absolute: 0.1 10*3/uL (ref 0.0–0.1)
Basophils Relative: 1 %
Eosinophils Absolute: 0.3 10*3/uL (ref 0.0–0.5)
Eosinophils Relative: 3 %
HCT: 25.3 % — ABNORMAL LOW (ref 39.0–52.0)
Hemoglobin: 7.6 g/dL — ABNORMAL LOW (ref 13.0–17.0)
Immature Granulocytes: 2 %
Lymphocytes Relative: 10 %
Lymphs Abs: 1.1 10*3/uL (ref 0.7–4.0)
MCH: 27.7 pg (ref 26.0–34.0)
MCHC: 30 g/dL (ref 30.0–36.0)
MCV: 92.3 fL (ref 80.0–100.0)
Monocytes Absolute: 1.2 10*3/uL — ABNORMAL HIGH (ref 0.1–1.0)
Monocytes Relative: 11 %
Neutro Abs: 8.3 10*3/uL — ABNORMAL HIGH (ref 1.7–7.7)
Neutrophils Relative %: 73 %
Platelets: 281 10*3/uL (ref 150–400)
RBC: 2.74 MIL/uL — ABNORMAL LOW (ref 4.22–5.81)
RDW: 18.5 % — ABNORMAL HIGH (ref 11.5–15.5)
WBC: 11.1 10*3/uL — ABNORMAL HIGH (ref 4.0–10.5)
nRBC: 0 % (ref 0.0–0.2)

## 2020-10-20 LAB — GLUCOSE, CAPILLARY
Glucose-Capillary: 109 mg/dL — ABNORMAL HIGH (ref 70–99)
Glucose-Capillary: 127 mg/dL — ABNORMAL HIGH (ref 70–99)
Glucose-Capillary: 142 mg/dL — ABNORMAL HIGH (ref 70–99)
Glucose-Capillary: 143 mg/dL — ABNORMAL HIGH (ref 70–99)

## 2020-10-20 LAB — CBC
HCT: 24.8 % — ABNORMAL LOW (ref 39.0–52.0)
Hemoglobin: 7.5 g/dL — ABNORMAL LOW (ref 13.0–17.0)
MCH: 28 pg (ref 26.0–34.0)
MCHC: 30.2 g/dL (ref 30.0–36.0)
MCV: 92.5 fL (ref 80.0–100.0)
Platelets: 278 10*3/uL (ref 150–400)
RBC: 2.68 MIL/uL — ABNORMAL LOW (ref 4.22–5.81)
RDW: 18.1 % — ABNORMAL HIGH (ref 11.5–15.5)
WBC: 9.8 10*3/uL (ref 4.0–10.5)
nRBC: 0 % (ref 0.0–0.2)

## 2020-10-20 LAB — COOXEMETRY PANEL
Carboxyhemoglobin: 0.8 % (ref 0.5–1.5)
Methemoglobin: 0.8 % (ref 0.0–1.5)
O2 Saturation: 68.6 %
Total hemoglobin: 12.9 g/dL (ref 12.0–16.0)

## 2020-10-20 MED ORDER — DOXAZOSIN MESYLATE 4 MG PO TABS: 4.0000 mg | ORAL_TABLET | Freq: Every day | ORAL | Status: AC

## 2020-10-20 MED ORDER — OXYCODONE HCL 5 MG/5ML PO SOLN: 10.0000 mg | Freq: Four times a day (QID) | ORAL | 0 refills | Status: AC | PRN

## 2020-10-20 MED ORDER — POLYETHYLENE GLYCOL 3350 17 G PO PACK: 17.0000 g | PACK | Freq: Every day | ORAL | 0 refills | Status: AC

## 2020-10-20 MED ORDER — INSULIN GLARGINE 100 UNIT/ML ~~LOC~~ SOLN: 5.0000 [IU] | Freq: Two times a day (BID) | SUBCUTANEOUS | 11 refills | Status: AC

## 2020-10-20 MED ORDER — SORBITOL 70 % SOLN: 30.0000 mL | Freq: Every day | Status: AC | PRN

## 2020-10-20 MED ORDER — ONDANSETRON HCL 4 MG/2ML IJ SOLN: 4.0000 mg | Freq: Four times a day (QID) | INTRAMUSCULAR | 0 refills | Status: AC | PRN

## 2020-10-20 MED ORDER — FREE WATER
300.0000 mL | Status: DC
  Administered 2020-10-20 (×2): 300 mL

## 2020-10-20 MED ORDER — CLOPIDOGREL BISULFATE 75 MG PO TABS: 75.0000 mg | ORAL_TABLET | Freq: Every day | ORAL | Status: AC

## 2020-10-20 MED ORDER — THIAMINE HCL 100 MG PO TABS: 100.0000 mg | ORAL_TABLET | Freq: Every day | ORAL | Status: AC

## 2020-10-20 MED ORDER — HYDRALAZINE HCL 25 MG PO TABS: 37.5000 mg | ORAL_TABLET | Freq: Three times a day (TID) | ORAL | Status: AC

## 2020-10-20 MED ORDER — ATORVASTATIN CALCIUM 80 MG PO TABS: 80.0000 mg | ORAL_TABLET | Freq: Every day | ORAL | Status: AC

## 2020-10-20 MED ORDER — SODIUM CHLORIDE 0.9% FLUSH: 10.0000 mL | INTRAVENOUS | Status: AC | PRN

## 2020-10-20 MED ORDER — CHLORHEXIDINE GLUCONATE 0.12% ORAL RINSE (MEDLINE KIT): 15.0000 mL | Freq: Two times a day (BID) | OROMUCOSAL | 0 refills | Status: AC

## 2020-10-20 MED ORDER — PIVOT 1.5 CAL PO LIQD: 1000.0000 mL | ORAL | 0 refills | Status: AC

## 2020-10-20 MED ORDER — GERHARDT'S BUTT CREAM: 1.0000 "application " | TOPICAL_CREAM | CUTANEOUS | Status: AC | PRN

## 2020-10-20 MED ORDER — ALBUTEROL SULFATE (2.5 MG/3ML) 0.083% IN NEBU: 2.5000 mg | INHALATION_SOLUTION | RESPIRATORY_TRACT | 12 refills | Status: AC | PRN

## 2020-10-20 MED ORDER — DEXTROSE 5 % IV SOLN: INTRAVENOUS | Status: DC

## 2020-10-20 MED ORDER — ISOSORBIDE DINITRATE 10 MG PO TABS: 10.0000 mg | ORAL_TABLET | Freq: Three times a day (TID) | ORAL | Status: AC

## 2020-10-20 MED ORDER — AMIODARONE HCL 200 MG PO TABS: 200.0000 mg | ORAL_TABLET | Freq: Every day | ORAL | Status: AC

## 2020-10-20 MED ORDER — QUETIAPINE FUMARATE 50 MG PO TABS: 50.0000 mg | ORAL_TABLET | Freq: Two times a day (BID) | ORAL | Status: AC

## 2020-10-20 MED ORDER — HEPARIN SODIUM (PORCINE) 5000 UNIT/ML IJ SOLN: 5000.0000 [IU] | Freq: Three times a day (TID) | INTRAMUSCULAR | Status: AC

## 2020-10-20 MED ORDER — ORAL CARE MOUTH RINSE: 15.0000 mL | Freq: Every day | OROMUCOSAL | 0 refills | Status: AC

## 2020-10-20 MED ORDER — FREE WATER: 300.0000 mL | Status: AC

## 2020-10-20 MED ORDER — FOLIC ACID 1 MG PO TABS: 1.0000 mg | ORAL_TABLET | Freq: Every day | ORAL | Status: AC

## 2020-10-20 MED ORDER — INSULIN ASPART 100 UNIT/ML IJ SOLN: 0.0000 [IU] | INTRAMUSCULAR | 11 refills | Status: AC

## 2020-10-20 MED ORDER — VALPROIC ACID 250 MG/5ML PO SOLN: 250.0000 mg | Freq: Two times a day (BID) | ORAL | Status: AC

## 2020-10-20 MED ORDER — ACETAMINOPHEN 160 MG/5ML PO SOLN: 650.0000 mg | Freq: Three times a day (TID) | ORAL | 0 refills | Status: AC | PRN

## 2020-10-20 MED ORDER — PANTOPRAZOLE SODIUM 40 MG PO PACK: 40.0000 mg | PACK | Freq: Every day | ORAL | Status: AC

## 2020-10-20 MED ORDER — LACTATED RINGERS IV SOLN: 10.0000 mL | INTRAVENOUS | 0 refills | Status: AC

## 2020-10-20 MED ORDER — LIP MEDEX EX OINT: TOPICAL_OINTMENT | CUTANEOUS | 0 refills | Status: AC | PRN

## 2020-10-20 MED ORDER — ASPIRIN 81 MG PO CHEW: 81.0000 mg | CHEWABLE_TABLET | Freq: Every day | ORAL | Status: AC

## 2020-10-20 MED ORDER — ROPINIROLE HCL 0.5 MG PO TABS: 0.5000 mg | ORAL_TABLET | Freq: Every day | ORAL | Status: AC

## 2020-10-20 NOTE — Progress Notes (Signed)
  Speech Language Pathology Treatment: Hillary Bow Speaking valve  Patient Details Name: Jack Vance MRN: [id number] DOB: May 03, 1941 Today's Date: 2020/11/03 Time: 4098-1191 SLP Time Calculation (min) (ACUTE ONLY): 15 min  Assessment / Plan / Recommendation Clinical Impression  Pt was seen for PMV trial while on TC this morning with good tolerance of PMV but minimal phonation. Mild, very hoarse vocalizations were noted immediately upon placement, but when cued to vocalize throughout the remainder of the session he was aphonic. Suspect an element of confusion in coordinating adduction/exhalation that is also contributing to dysphonia. He responds the most when spoken to loudly and clearly in his R ear or when given written instructions, and is responding with a little more consistency today - primarily with gestures or single words. Recommend PMV placement while on TC with full staff supervision.    HPI HPI: Pt is a 80 yo male presenting with cardiogenic shock secondary to NSTEMI with acute pulmonary edema and heart failure s/p urgent LHC. 5/6 IABP and CABG with IABP out 5/9. Pt remained intubated post op. Trach 5/23. PMhx: arthritis, asthma, DDD, RLS      SLP Plan  Continue with current plan of care       Recommendations         Patient may use Passy-Muir Speech Valve: Intermittently with supervision;During all therapies with supervision (when on TC) PMSV Supervision: Full         Oral Care Recommendations: Oral care QID Follow up Recommendations: LTACH SLP Visit Diagnosis: Aphonia (R49.1) Plan: Continue with current plan of care       GO                 Mahala Menghini., M.A. CCC-SLP Acute Rehabilitation Services Pager [phone] Office [phone]  Nov 03, 2020, 9:55 AM

## 2020-10-20 NOTE — Progress Notes (Signed)
      [address]       [name] [address]             [phone]       Patient was discussed with Britt Boozer from [facility] and Darius Bump from our office. Per Dr. Cliffton Asters and Dr. Prescott Gum note patient is cleared for transfer to [facility] today. I will take care of the discharge.   Jari Favre, PA-C

## 2020-10-20 NOTE — Progress Notes (Signed)
Pt for d/c today, has not progressed to ambulation. N/a for CRPII at this time therefore will not place order. CRPII can be referred from outpatient setting.  Ethelda Chick CES, ACSM 1:25 PM Nov 11, 2020

## 2020-10-20 NOTE — Progress Notes (Signed)
      [address]       [place] [address]             [phone]                 27 Days Post-Op Procedure(s) (LRB): CORONARY ARTERY BYPASS GRAFTING (CABG)  TIMES 4 USING LEFT GREATER SAPHENOUS VEIN HARVESTED ENDOSCOPICALLY AND LEFT INTERNAL MAMMARY ARTERY (N/A) TRANSESOPHAGEAL ECHOCARDIOGRAM (TEE) (N/A)   Events: No events On trach collar this am  _______________________________________________________________ Vitals: BP (!) 148/59   Pulse 77   Temp 97.7 F (36.5 C) (Oral)   Resp (!) 27   Ht 5\' 6"  (1.676 m)   Wt 79 kg   SpO2 98%   BMI 28.11 kg/m   - Neuro:awake  - Cardiovascular: Normal sinus rhythm.  I  Drips:  none CVP:  [11 mmHg-14 mmHg] 14 mmHg  - Pulm:  On TC ABG    Component Value Date/Time   PHART 7.410 09/26/2020 0539   PCO2ART 31.1 (L) 09/26/2020 0539   PO2ART 136 (H) 09/26/2020 0539   HCO3 19.9 (L) 09/26/2020 0539   TCO2 21 (L) 09/26/2020 0539   ACIDBASEDEF 4.0 (H) 09/26/2020 0539   O2SAT 68.6 10/24/2020 0345    - Abd: distended, but soft - Extremity: Warm  .Intake/Output      06/01 0701 06/02 0700 06/02 0701 06/03 0700   Other     NG/GT 1510.8    Total Intake(mL/kg) 1510.8 (19.1)    Urine (mL/kg/hr) 2550 (1.3)    Stool 300    Total Output 2850    Net -1339.3            _______________________________________________________________ Labs: CBC Latest Ref Rng & Units 10/24/2020 10/19/2020 10/18/2020  WBC 4.0 - 10.5 K/uL 9.8 11.1(H) 13.8(H)  Hemoglobin 13.0 - 17.0 g/dL 7.5(L) 7.4(L) 7.4(L)  Hematocrit 39.0 - 52.0 % 24.8(L) 24.5(L) 24.8(L)  Platelets 150 - 400 K/uL 278 320 336   CMP Latest Ref Rng & Units 11/11/2020 10/19/2020 10/18/2020  Glucose 70 - 99 mg/dL 10/28/2020) 841(Y) 92  BUN 8 - 23 mg/dL 606(T) 016(W) 109(N)  Creatinine 0.61 - 1.24 mg/dL 235(T) 7.32(K) 0.25(K)  Sodium 135 - 145 mmol/L 150(H) 147(H) 142  Potassium 3.5 - 5.1 mmol/L 4.4 4.3 4.5  Chloride 98 - 111 mmol/L 112(H) 109 105  CO2 22 - 32 mmol/L 29 28 27    Calcium 8.9 - 10.3 mg/dL 2.70(W) ) 8.3(L)  Total Protein 6.5 - 8.1 g/dL - - -  Total Bilirubin 0.3 - 1.2 mg/dL - - -  Alkaline Phos 38 - 126 U/L - - -  AST 15 - 41 U/L - - -  ALT 0 - 44 U/L - - -    CXR: -  _______________________________________________________________  Assessment and Plan: POD 27 s/p emergency CABG.  Neuro: agitation improving CV:  Off milr.   Pulm: wean vent as tolerated Renal: making urine now.  Foley in place.  Creat trending down.  AKI likely post-obstructive.  No plans to remove foley prior to transfer GI: on goal tube feeds. Heme: Stable 6.2(G.   Endo: Sliding scale insulin. Dispo: clear for transfer to [facility] 11/04/2020 8:41 AM

## 2020-10-20 NOTE — TOC Transition Note (Signed)
Transition of Care [facility]) - CM/SW Discharge Note   Patient Details  Name: Jack Vance MRN: [id number] Date of Birth: August 10, 1940  [facility] CM/SW Contact:  Gala Lewandowsky, RN Phone Number: 10/26/2020, 1:08 PM   Clinical Narrative:  Bed available at [facility], Per MD, patient is ready for transition to LTAC today. Patient will transition to [facility] room 10. The receiving physician is Dr. Sharyon Medicus and the RN can call report to [phone]. [facility] to reach out to the sister to make her aware of transition.  No further needs from Case Manager at this time.      Final next level of care: Long Term Acute Care (LTAC) Barriers to Discharge: No Barriers Identified   Patient Goals and CMS Choice Patient states their goals for this hospitalization and ongoing recovery are:: plan for LTAC   Choice offered to / list presented to : Sibling  Discharge Plan and Services In-house Referral: Clinical Social Work Discharge Planning Services: CM Consult Post Acute Care Choice: Long Term Acute Care (LTAC)           Readmission Risk Interventions Readmission Risk Prevention Plan 10/12/2020  Transportation Screening Complete  HRI or Home Care Consult Complete  Social Work Consult for Recovery Care Planning/Counseling Complete  Palliative Care Screening Not Applicable  Medication Review [employer]) Complete  Some recent data might be hidden

## 2020-10-20 NOTE — Progress Notes (Signed)
Physical Therapy Treatment Patient Details Name: Jack Vance MRN: [id number] DOB: 29-Aug-1940 Today's Date: 17-Nov-2020    History of Present Illness 80 yo admitted 5/6 with cardiogenic shock secondary to NSTEMI with acute pulmonary edema and heart failure s/p urgent LHC. 5/6 IABP and CABG with IABP out 5/9. Pt remained intubated post op. Trach 5/23. PMhx: arthritis, asthma, DDD, RLS    PT Comments    Pt with significantly improved arousal, participation and interaction. Pt able to maintain min assist sitting balance and assist with standing and pivoting to chair. Pt with limited tolerance/command following once in chair for bil LE HEP but demonstrates functional strength with automatic movement. Pt attempting communicate but mumbled words make lip reading impossible. Significant improvement from prior sessions.  Pt educated for sternal precautions but no demonstration of awareness.   Pt on 40% trach collar throughout with SpO2 97% 133/62 (81) HR 76-84    Follow Up Recommendations  LTACH;Supervision/Assistance - 24 hour     Equipment Recommendations  Hospital bed;Wheelchair (measurements PT)    Recommendations for Other Services       Precautions / Restrictions Precautions Precautions: Fall;Sternal;Other (comment) Precaution Comments: trach, cortrak, flexiseal, bil mittens and wrist restraints    Mobility  Bed Mobility Overal bed mobility: Needs Assistance Bed Mobility: Rolling;Sidelying to Sit Rolling: Mod assist Sidelying to sit: Mod assist;+2 for physical assistance       General bed mobility comments: mod assist to bend left knee, use of pad and mod cues to roll to left and mod +2 to bring legs off bed and elevate trunk from side. Pt with minguard-min assist for sitting balance EOB    Transfers Overall transfer level: Needs assistance   Transfers: Sit to/from Stand Sit to Stand: Mod assist;+2 physical assistance         General transfer comment: mod +2 assist  to rise from EOB and from chair with belt and pad to assist with rise. Max cues for hand placement with better result with use of hooking pt arm for appropriate placement without axillary pressure as pt has tendency to reach out with cues for hands on thighs. Pivot bed to chair toward right with mod +2 with belt with pt able to step and advance feet  Ambulation/Gait             General Gait Details: not yet able   Stairs             Wheelchair Mobility    Modified Rankin (Stroke Patients Only)       Balance Overall balance assessment: Needs assistance   Sitting balance-Leahy Scale: Poor Sitting balance - Comments: min-minguad assist for sitting EOB   Standing balance support: Bilateral upper extremity supported Standing balance-Leahy Scale: Poor Standing balance comment: mod +2 for standing                            Cognition Arousal/Alertness: Awake/alert Behavior During Therapy: Flat affect Overall Cognitive Status: Impaired/Different from baseline Area of Impairment: Following commands;Attention;Problem solving;Safety/judgement                   Current Attention Level: Sustained   Following Commands: Follows one step commands inconsistently Safety/Judgement: Decreased awareness of deficits;Decreased awareness of safety   Problem Solving: Slow processing;Decreased initiation;Difficulty sequencing;Requires verbal cues;Requires tactile cues General Comments: pt on trach collar with pt attempting to mouth words but mostly unintelligible with periodic yes/no responses at times appropriate. Pt with  increased alertness, participation and interaction throughout      Exercises General Exercises - Lower Extremity Long Arc Quad: PROM;AAROM;Right;Left;Seated;10 reps (AAROm on RLE, PROM on LLE)    General Comments        Pertinent Vitals/Pain Pain Assessment: No/denies pain    Home Living                      Prior Function             PT Goals (current goals can now be found in the care plan section) Progress towards PT goals: Progressing toward goals    Frequency    Min 3X/week      PT Plan Current plan remains appropriate    Co-evaluation              AM-PAC PT "6 Clicks" Mobility   Outcome Measure  Help needed turning from your back to your side while in a flat bed without using bedrails?: A Lot Help needed moving from lying on your back to sitting on the side of a flat bed without using bedrails?: Total Help needed moving to and from a bed to a chair (including a wheelchair)?: Total Help needed standing up from a chair using your arms (e.g., wheelchair or bedside chair)?: Total Help needed to walk in hospital room?: Total Help needed climbing 3-5 steps with a railing? : Total 6 Click Score: 7    End of Session Equipment Utilized During Treatment: Gait belt Activity Tolerance: Patient tolerated treatment well Patient left: in chair;with call bell/phone within reach;with chair alarm set Nurse Communication: Mobility status;Precautions PT Visit Diagnosis: Other abnormalities of gait and mobility (R26.89);Muscle weakness (generalized) (M62.81)     Time: 1696-7893 PT Time Calculation (min) (ACUTE ONLY): 29 min  Charges:  $Therapeutic Activity: 23-37 mins                     Sharin Altidor P, PT Acute Rehabilitation Services Pager: [phone] Office: [phone]    Shikita Vaillancourt B Anyelina Claycomb 10/28/2020, 1:51 PM

## 2020-10-20 NOTE — Plan of Care (Signed)
  Problem: Activity: Goal: Risk for activity intolerance will decrease Outcome: Progressing   Problem: Cardiac: Goal: Will achieve and/or maintain hemodynamic stability Outcome: Progressing   Problem: Clinical Measurements: Goal: Postoperative complications will be avoided or minimized Outcome: Progressing   Problem: Respiratory: Goal: Respiratory status will improve Outcome: Progressing   Problem: Skin Integrity: Goal: Wound healing without signs and symptoms of infection Outcome: Progressing Goal: Risk for impaired skin integrity will decrease Outcome: Progressing   Problem: Urinary Elimination: Goal: Ability to achieve and maintain adequate renal perfusion and functioning will improve Outcome: Progressing   Problem: Clinical Measurements: Goal: Ability to maintain clinical measurements within normal limits will improve Outcome: Progressing Goal: Will remain free from infection Outcome: Progressing Goal: Diagnostic test results will improve Outcome: Progressing Goal: Respiratory complications will improve Outcome: Progressing Goal: Cardiovascular complication will be avoided Outcome: Progressing   Problem: Activity: Goal: Risk for activity intolerance will decrease Outcome: Progressing   Problem: Nutrition: Goal: Adequate nutrition will be maintained Outcome: Progressing   Problem: Coping: Goal: Level of anxiety will decrease Outcome: Progressing   Problem: Elimination: Goal: Will not experience complications related to bowel motility Outcome: Progressing Goal: Will not experience complications related to urinary retention Outcome: Progressing   Problem: Pain Managment: Goal: General experience of comfort will improve Outcome: Progressing   Problem: Safety: Goal: Ability to remain free from injury will improve Outcome: Progressing   Problem: Skin Integrity: Goal: Risk for impaired skin integrity will decrease Outcome: Progressing   Problem:  Respiratory: Goal: Ability to maintain a clear airway and adequate ventilation will improve Outcome: Progressing   Problem: Clinical Measurements: Goal: Complications related to the disease process or treatment will be avoided or minimized Outcome: Progressing Goal: Dialysis access will remain free of complications Outcome: Progressing   Problem: Nutritional: Goal: Ability to make appropriate dietary choices will improve Outcome: Progressing   Problem: Respiratory: Goal: Respiratory symptoms related to disease process will be avoided Outcome: Progressing

## 2020-10-20 NOTE — Progress Notes (Signed)
RT removed pt from ventilator and placed pt on trach collar, per MD verbally. Pt tolerating well at this time. RT will continue to monitor pt.

## 2020-10-20 NOTE — Progress Notes (Signed)
Patient ID: Jack Vance, male   DOB: Jun 30, 1940, 80 y.o.   MRN: [id number] [facility] Progress Note   Assessment/ Plan:   1. Acute kidney Injury: With baseline normal renal function and acute injury from ATN/cardiogenic shock with differential for cholesterol emboli also entertained.  Renal function appears to show improvement with downtrending BUN/creatinine and good urine output.  He does not have indications for dialysis at this time. 2.  Hypernatremia: Secondary to impaired free water intake/increased insensible losses with altered mental status and brisk urine output.  He has a free water deficit of about 3 to 4 L and I will increase free water flushes to 300 cc to every 4 hours (1800 cc/day) and begin D5W at 100 cc/h for 1 L. 3.  Non-ST elevation MI status post emergent four-vessel CABG on 5/6.  Suffered cardiogenic shock from which he appears to have recovered with intermittent diuretic dosing. 4.  Acute hypoxic respiratory failure: Secondary to aspiration pneumonia and pulmonary edema/cardiogenic shock and now on ventilator via tracheostomy 5.  Anemia: Secondary to critical illness, no indications for PRBC transfusion.  Will prescribe intravenous iron.  Subjective:   Appears awake/alert but unable to respond to questions.   Objective:   BP (!) 148/59   Pulse 77   Temp 97.6 F (36.4 C) (Oral)   Resp (!) 27   Ht _0  (1.676 m)   Wt 79 kg   SpO2 98%   BMI 28.11 kg/m   Intake/Output Summary (Last 24 hours) at 10/26/2020 0755 Last data filed at 10/19/2020 0700 Gross per 24 hour  Intake 1510.75 ml  Output 2850 ml  Net -1339.25 ml   Weight change: -1.9 kg  Physical Exam: Gen: Awake and looking at objects around the room with limited eye contact. CVS: Pulse regular rhythm, normal rate, S1 and S2 normal Resp: Coarse/transmitted breath sounds bilaterally without distinct rales/wheeze.  Tracheostomy in situ Abd: Soft, obese, nontender, bowel sounds normal Ext: No  lower extremity edema  Imaging: No results found.  Labs: BMET Recent Labs  Lab 10/14/20 1301 10/15/20 0345 10/16/20 8016 10/17/20 0530 10/18/20 0420 10/19/20 0447 11/02/2020 0337  NA 151* 151* 145 139 142 147* 150*  K 4.8 4.6 4.5 4.4 4.5 4.3 4.4  CL 114* 116* 110 104 105 109 112*  CO2 _1 GLUCOSE 123* 149* 185* 117* 92 149* 151*  BUN 135* 145* 144* 151* 159* 147* 130*  CREATININE 2.62* 2.73* 2.80* 2.89* 3.05* 2.61* 2.17*  CALCIUM 8.4* 8.5* 8.1* 8.3* 8.3* 8.5* 8.5*  PHOS  --   --   --   --   --  4.1 4.2   CBC Recent Labs  Lab 10/16/20 0312 10/18/20 0420 10/19/20 0447 11/05/2020 0337  WBC 15.1* 13.8* 11.1* 9.8  HGB 8.2* 7.4* 7.4* 7.5*  HCT 27.5* 24.8* 24.5* 24.8*  MCV 92.3 91.9 91.8 92.5  PLT 345 336 320 278    Medications:    . amiodarone  200 mg Per Tube Daily  . aspirin  81 mg Per Tube Daily  . atorvastatin  80 mg Per Tube Daily  . chlorhexidine gluconate (MEDLINE KIT)  15 mL Mouth Rinse BID  . Chlorhexidine Gluconate Cloth  6 each Topical Daily  . clopidogrel  75 mg Per Tube Daily  . doxazosin  4 mg Per Tube Daily  . folic acid  1 mg Per Tube Daily  . free water  200 mL Per Tube Q4H  . heparin  injection (subcutaneous)  5,000 Units Subcutaneous Q8H  . hydrALAZINE  37.5 mg Per Tube Q8H  . insulin aspart  0-24 Units Subcutaneous Q4H  . insulin glargine  5 Units Subcutaneous BID  . isosorbide dinitrate  10 mg Per Tube TID  . mouth rinse  15 mL Mouth Rinse 10 times per day  . pantoprazole sodium  40 mg Per Tube Daily  . polyethylene glycol  17 g Per Tube Daily  . QUEtiapine  50 mg Per Tube BID  . rOPINIRole  0.5 mg Per Tube QHS  . thiamine  100 mg Per Tube Daily  . valproic acid  250 mg Per Tube BID   Elmarie Shiley, MD 11/10/2020, 7:55 AM

## 2020-10-20 NOTE — Progress Notes (Signed)
Patient ID: Jack Vance, male   DOB: December 25, 1940, 80 y.o.   MRN: [id number]    Advanced Heart Failure Rounding Note   Subjective:    Underwent emergent CABG on 5/6 for critical LM disease (LIMA -> LAD, SVG -> OM, SVG -> RCA) 5/6 Cultures negative.  5/8 Possible aspiration. A fib --> started amio drip.  Given 2u PRBCs. Hypotensive in the evening and started on vasopressin + antibiotics.  5/9 IABP removed 5/10 NG placed >1.5 out  5/11 started neostigmime 5/12 Febrile 102 5/17 Failed SBT. Underwent bronchoscopy for attempted therapeutic aspiration for mucus plugging. ETT exchanged.  5/19 Hgb 6.1 given 1UPRBC 5/21  Failed SBT  5/23 Trach.   On trach collar now. Remains agitated but will follow some commands Serum sodium climbing Creatinine improving   Objective:   Weight Range:  Vital Signs:   Temp:  [97.5 F (36.4 C)-98.2 F (36.8 C)] 97.6 F (36.4 C) (06/02 0300) Pulse Rate:  [66-80] 77 (06/02 0740) Resp:  [15-30] 27 (06/02 0740) BP: (116-152)/(44-72) 148/59 (06/02 0740) SpO2:  [89 %-99 %] 98 % (06/02 0740) FiO2 (%):  [30 %-40 %] 40 % (06/02 0740) Weight:  [79 kg] 79 kg (06/02 0528) Last BM Date: 11/17/20  Weight change: Filed Weights   10/18/20 0600 10/19/20 0500 11/06/2020 0528  Weight: 83.6 kg 80.9 kg 79 kg    Intake/Output:   Intake/Output Summary (Last 24 hours) at 10/30/2020 0748 Last data filed at 10/31/2020 0700 Gross per 24 hour  Intake 1510.75 ml  Output 2850 ml  Net -1339.25 ml    PHYSICAL EXAM: General:  Chronically ill appearing WM w/ trach.  No distress. Awake but slightly agitated. Will follow some commands HEENT: normal Neck: supple. + trach no JVD. Carotids 2+ bilat; no bruits. No lymphadenopathy or thryomegaly appreciated. Cor: PMI nondisplaced. Regular rate & rhythm. No rubs, gallops or murmurs. Lungs: clear Abdomen: soft, nontender, nondistended. No hepatosplenomegaly. No bruits or masses. Good bowel sounds. Extremities: no cyanosis, clubbing,  rash, edema Neuro: agitated   Telemetry: Sinus 70-80s Personally reviewed   Labs: Basic Metabolic Panel: Recent Labs  Lab 10/16/20 0312 10/17/20 0530 10/18/20 0420 10/19/20 0447 10/22/2020 0337  NA 145 139 142 147* 150*  K 4.5 4.4 4.5 4.3 4.4  CL 110 104 105 109 112*  CO2 $Re'27 25 27 28 29  'fot$ GLUCOSE 185* 117* 92 149* 151*  BUN 144* 151* 159* 147* 130*  CREATININE 2.80* 2.89* 3.05* 2.61* 2.17*  CALCIUM 8.1* 8.3* 8.3* 8.5* 8.5*  PHOS  --   --   --  4.1 4.2    Liver Function Tests: Recent Labs  Lab 10/19/20 0447 11/04/2020 0337  ALBUMIN 1.6* 1.6*   No results for input(s): LIPASE, AMYLASE in the last 168 hours. No results for input(s): AMMONIA in the last 168 hours.  CBC: Recent Labs  Lab 10/15/20 0345 10/16/20 0312 10/18/20 0420 10/19/20 0447 11/04/2020 0337  WBC 11.1* 15.1* 13.8* 11.1* 9.8  HGB 7.2* 8.2* 7.4* 7.4* 7.5*  HCT 24.7* 27.5* 24.8* 24.5* 24.8*  MCV 94.6 92.3 91.9 91.8 92.5  PLT 353 345 336 320 278    Cardiac Enzymes: No results for input(s): CKTOTAL, CKMB, CKMBINDEX, TROPONINI in the last 168 hours.  BNP: BNP (last 3 results) Recent Labs    09/23/20 0311  BNP 1,191.8*    ProBNP (last 3 results) No results for input(s): PROBNP in the last 8760 hours.    Other results:  Imaging: No results found.   Medications:  Scheduled Medications: . amiodarone  200 mg Per Tube Daily  . aspirin  81 mg Per Tube Daily  . atorvastatin  80 mg Per Tube Daily  . chlorhexidine gluconate (MEDLINE KIT)  15 mL Mouth Rinse BID  . Chlorhexidine Gluconate Cloth  6 each Topical Daily  . clopidogrel  75 mg Per Tube Daily  . doxazosin  4 mg Per Tube Daily  . folic acid  1 mg Per Tube Daily  . free water  200 mL Per Tube Q4H  . heparin injection (subcutaneous)  5,000 Units Subcutaneous Q8H  . hydrALAZINE  37.5 mg Per Tube Q8H  . insulin aspart  0-24 Units Subcutaneous Q4H  . insulin glargine  5 Units Subcutaneous BID  . isosorbide dinitrate  10 mg Per Tube  TID  . mouth rinse  15 mL Mouth Rinse 10 times per day  . pantoprazole sodium  40 mg Per Tube Daily  . polyethylene glycol  17 g Per Tube Daily  . QUEtiapine  50 mg Per Tube BID  . rOPINIRole  0.5 mg Per Tube QHS  . thiamine  100 mg Per Tube Daily  . valproic acid  250 mg Per Tube BID    Infusions: . feeding supplement (PIVOT 1.5 CAL) 65 mL/hr at 11/02/2020 0700  . lactated ringers Stopped (10/01/20 0607)    PRN Medications: acetaminophen (TYLENOL) oral liquid 160 mg/5 mL, albuterol, fentaNYL (SUBLIMAZE) injection, Gerhardt's butt cream, lip balm, metoprolol tartrate, ondansetron (ZOFRAN) IV, oxyCODONE, sodium chloride flush, sodium chloride flush, sorbitol   Assessment/Plan:   1. 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  - Co-ox 69%  - Have been holding diuretics due to AKI. Volume ok.  - No ACE/ARB/ARNI/MRA with AKI. No b-blocker yet with shock.  - c/w hydral/nitrates for afterload reduction BP ok   2. NSTEMI with critical CAD - 95% LM and high grade ostial RCA - CABG on 5/6 for critical LM disease (LIMA -> LAD, SVG -> OM, SVG -> RCA) - Continue ASA + Plavix   - Continue statin.  - No s/s angina  3. Acute hypoxic respiratory failure due to pulmonary edema - on vent through trach  - Possible aspiration 5/8 & 5/10. Antibiotics completed 5/16 - Failing vent wean due to agitation and weakness.  - Trach changed 5/30 - CCM managing - on TCT today  4. AKI / Uremia  - Renal u/s with mild left hydro. Found to have urinary retention with > 1600 cc out and now Foley in place - Good UOP yesterday w/ 5L out  - SCr/ BUN down trending after decompression - Will need eventual Foley wean. Add Flomax   5. Ventral Hernia  - 5/10 NG placed to decompress   6. ID - Finished zosyn for possible aspiration 5/16.  - Blood  CX 5/6- NGTD, UC 5/13 NGTD  - Afebrile  7. Anemia - Given 1 unit PRBC 5/19. - Hgb 7.5 on co-ox  8. Hypernatremia  -Na 147 -> 150  - continue FW. Can give some IVF as needed  9. Atrial fibrillation - In NSR  - Continue amio 200 mg daily.   10. Acute encephalopathy - remains agitated. Nephrology not convinced this is due to uremic encephalopathy - Plan as above - reduced protein load from his tube feeds to limit worsening azotemia.  11. Dispo - With improving renal function I think he is ready for LTAC    Length of Stay: 51   Glori Bickers, MD  7:48 AM

## 2020-10-21 ENCOUNTER — Other Ambulatory Visit (HOSPITAL_COMMUNITY): Payer: Self-pay

## 2020-10-21 LAB — COMPREHENSIVE METABOLIC PANEL
ALT: 34 U/L (ref 0–44)
AST: 29 U/L (ref 15–41)
Albumin: 1.8 g/dL — ABNORMAL LOW (ref 3.5–5.0)
Alkaline Phosphatase: 88 U/L (ref 38–126)
Anion gap: 9 (ref 5–15)
BUN: 110 mg/dL — ABNORMAL HIGH (ref 8–23)
CO2: 29 mmol/L (ref 22–32)
Calcium: 8.9 mg/dL (ref 8.9–10.3)
Chloride: 116 mmol/L — ABNORMAL HIGH (ref 98–111)
Creatinine, Ser: 2.09 mg/dL — ABNORMAL HIGH (ref 0.61–1.24)
GFR, Estimated: 32 mL/min — ABNORMAL LOW (ref >60)
Glucose, Bld: 104 mg/dL — ABNORMAL HIGH (ref 70–99)
Potassium: 4.4 mmol/L (ref 3.5–5.1)
Sodium: 154 mmol/L — ABNORMAL HIGH (ref 135–145)
Total Bilirubin: 0.8 mg/dL (ref 0.3–1.2)
Total Protein: 5.7 g/dL — ABNORMAL LOW (ref 6.5–8.1)

## 2020-10-21 LAB — URINALYSIS, ROUTINE W REFLEX MICROSCOPIC
Bilirubin Urine: NEGATIVE
Glucose, UA: NEGATIVE mg/dL
Ketones, ur: NEGATIVE mg/dL
Nitrite: NEGATIVE
Protein, ur: 100 mg/dL — AB
Specific Gravity, Urine: 1.014 (ref 1.005–1.030)
pH: 5 (ref 5.0–8.0)

## 2020-10-21 LAB — BLOOD GAS, ARTERIAL
Acid-Base Excess: 4.2 mmol/L — ABNORMAL HIGH (ref 0.0–2.0)
Bicarbonate: 28.8 mmol/L — ABNORMAL HIGH (ref 20.0–28.0)
FIO2: 35
O2 Saturation: 98.9 %
Patient temperature: 37
pCO2 arterial: 48.4 mmHg — ABNORMAL HIGH (ref 32.0–48.0)
pH, Arterial: 7.393 (ref 7.350–7.450)
pO2, Arterial: 151 mmHg — ABNORMAL HIGH (ref 83.0–108.0)

## 2020-10-21 LAB — HEMOGLOBIN A1C
Hgb A1c MFr Bld: 6.2 % — ABNORMAL HIGH (ref 4.8–5.6)
Mean Plasma Glucose: 131 mg/dL

## 2020-10-22 LAB — RENAL FUNCTION PANEL
Albumin: 2.5 g/dL — ABNORMAL LOW (ref 3.5–5.0)
Anion gap: 13 (ref 5–15)
BUN: 85 mg/dL — ABNORMAL HIGH (ref 8–23)
CO2: 28 mmol/L (ref 22–32)
Calcium: 9.7 mg/dL (ref 8.9–10.3)
Chloride: 115 mmol/L — ABNORMAL HIGH (ref 98–111)
Creatinine, Ser: 1.99 mg/dL — ABNORMAL HIGH (ref 0.61–1.24)
GFR, Estimated: 34 mL/min — ABNORMAL LOW (ref >60)
Glucose, Bld: 109 mg/dL — ABNORMAL HIGH (ref 70–99)
Phosphorus: 4.7 mg/dL — ABNORMAL HIGH (ref 2.5–4.6)
Potassium: 4.4 mmol/L (ref 3.5–5.1)
Sodium: 156 mmol/L — ABNORMAL HIGH (ref 135–145)

## 2020-10-22 LAB — MAGNESIUM: Magnesium: 2.4 mg/dL (ref 1.7–2.4)

## 2020-10-22 LAB — URINE CULTURE: Culture: NO GROWTH

## 2020-10-23 LAB — RENAL FUNCTION PANEL
Albumin: 2 g/dL — ABNORMAL LOW (ref 3.5–5.0)
Anion gap: 6 (ref 5–15)
BUN: 68 mg/dL — ABNORMAL HIGH (ref 8–23)
CO2: 29 mmol/L (ref 22–32)
Calcium: 9 mg/dL (ref 8.9–10.3)
Chloride: 115 mmol/L — ABNORMAL HIGH (ref 98–111)
Creatinine, Ser: 1.84 mg/dL — ABNORMAL HIGH (ref 0.61–1.24)
GFR, Estimated: 37 mL/min — ABNORMAL LOW (ref >60)
Glucose, Bld: 113 mg/dL — ABNORMAL HIGH (ref 70–99)
Phosphorus: 5.7 mg/dL — ABNORMAL HIGH (ref 2.5–4.6)
Potassium: 4.6 mmol/L (ref 3.5–5.1)
Sodium: 150 mmol/L — ABNORMAL HIGH (ref 135–145)

## 2020-10-23 LAB — CBC
HCT: 31.1 % — ABNORMAL LOW (ref 39.0–52.0)
Hemoglobin: 9 g/dL — ABNORMAL LOW (ref 13.0–17.0)
MCH: 28 pg (ref 26.0–34.0)
MCHC: 28.9 g/dL — ABNORMAL LOW (ref 30.0–36.0)
MCV: 96.6 fL (ref 80.0–100.0)
Platelets: 290 10*3/uL (ref 150–400)
RBC: 3.22 MIL/uL — ABNORMAL LOW (ref 4.22–5.81)
RDW: 18.4 % — ABNORMAL HIGH (ref 11.5–15.5)
WBC: 15.3 10*3/uL — ABNORMAL HIGH (ref 4.0–10.5)
nRBC: 0 % (ref 0.0–0.2)

## 2020-10-23 LAB — CULTURE, RESPIRATORY W GRAM STAIN

## 2020-10-23 LAB — MAGNESIUM: Magnesium: 2.1 mg/dL (ref 1.7–2.4)

## 2020-10-28 ENCOUNTER — Telehealth: Payer: No Typology Code available for payment source | Admitting: Thoracic Surgery (Cardiothoracic Vascular Surgery)

## 2020-11-18 DEATH — deceased

## 2020-11-25 ENCOUNTER — Ambulatory Visit: Payer: No Typology Code available for payment source | Admitting: Thoracic Surgery (Cardiothoracic Vascular Surgery)

## 2022-07-23 IMAGING — DX DG CHEST 1V PORT
1 series · 2 of 2 positions shown · non-contrast
Comparison: Film from earlier in the same day.

CLINICAL DATA: Status post coronary bypass grafting

EXAM:
PORTABLE CHEST 1 VIEW

[Series 1: chest · 0.14mm/px · 2 of 2 slices shown]
[im 1/2]
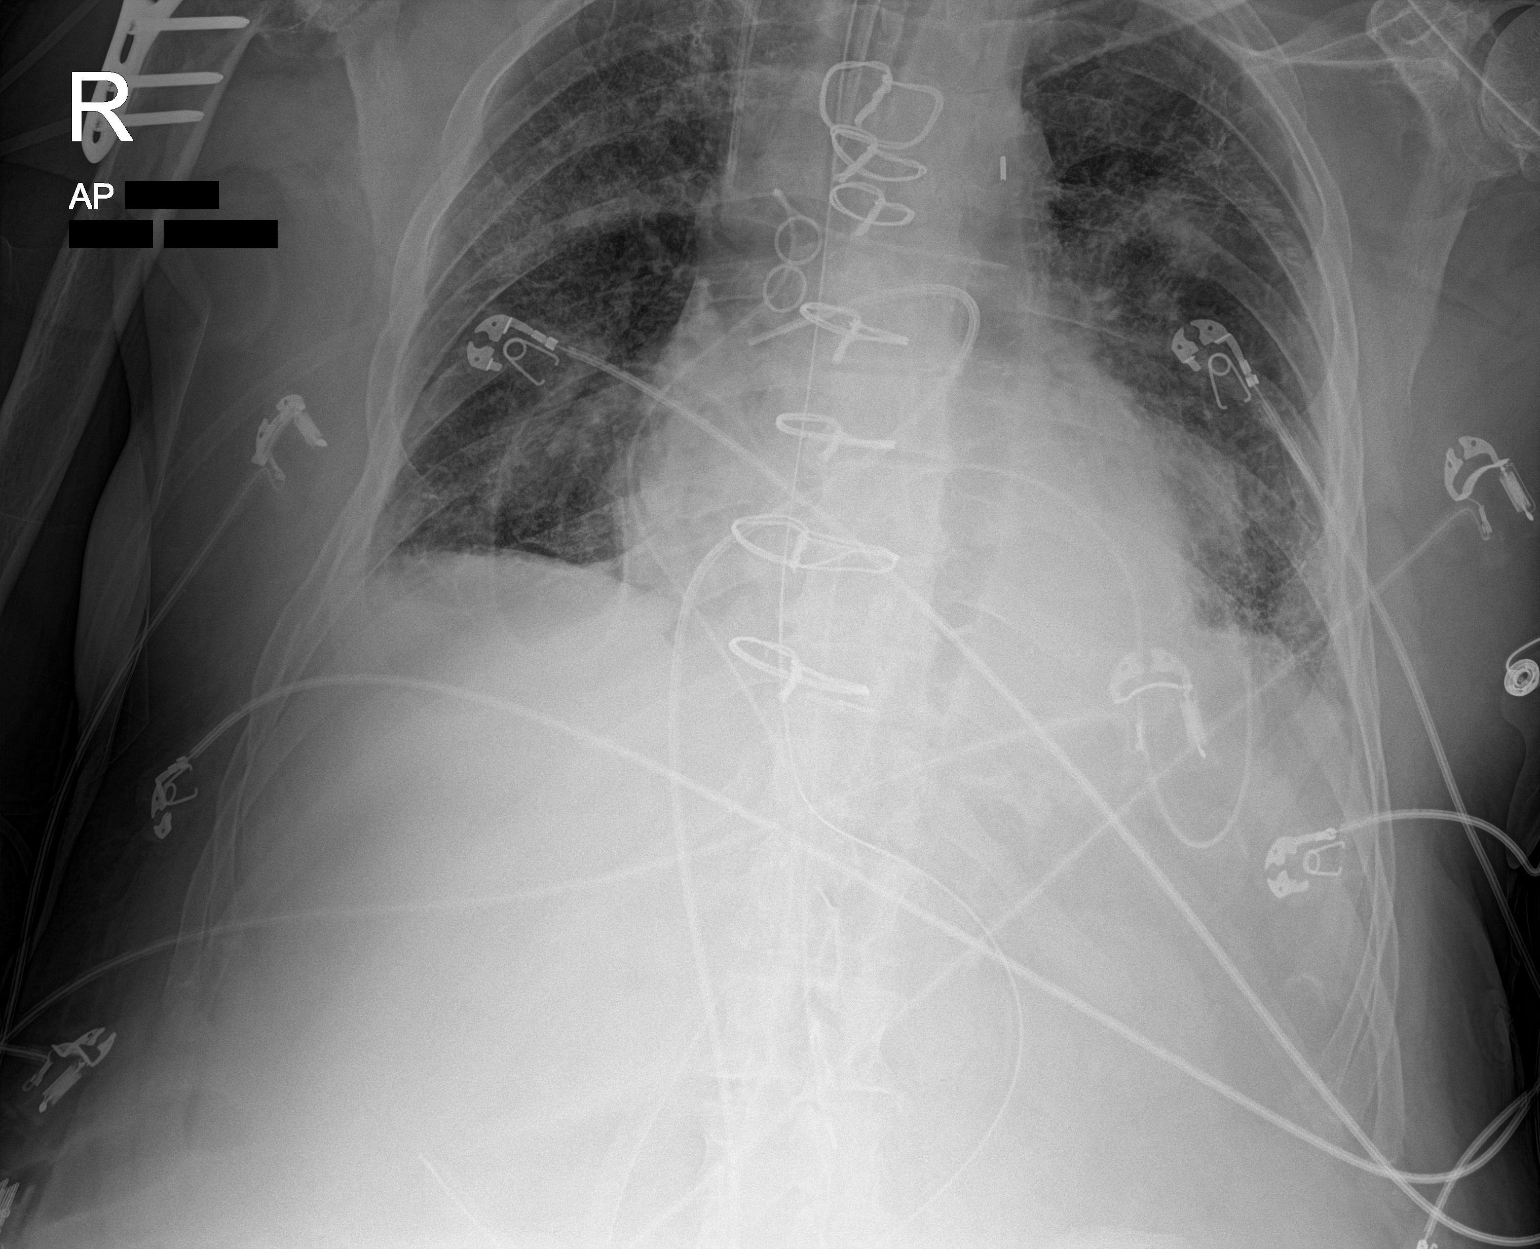
[im 2/2]
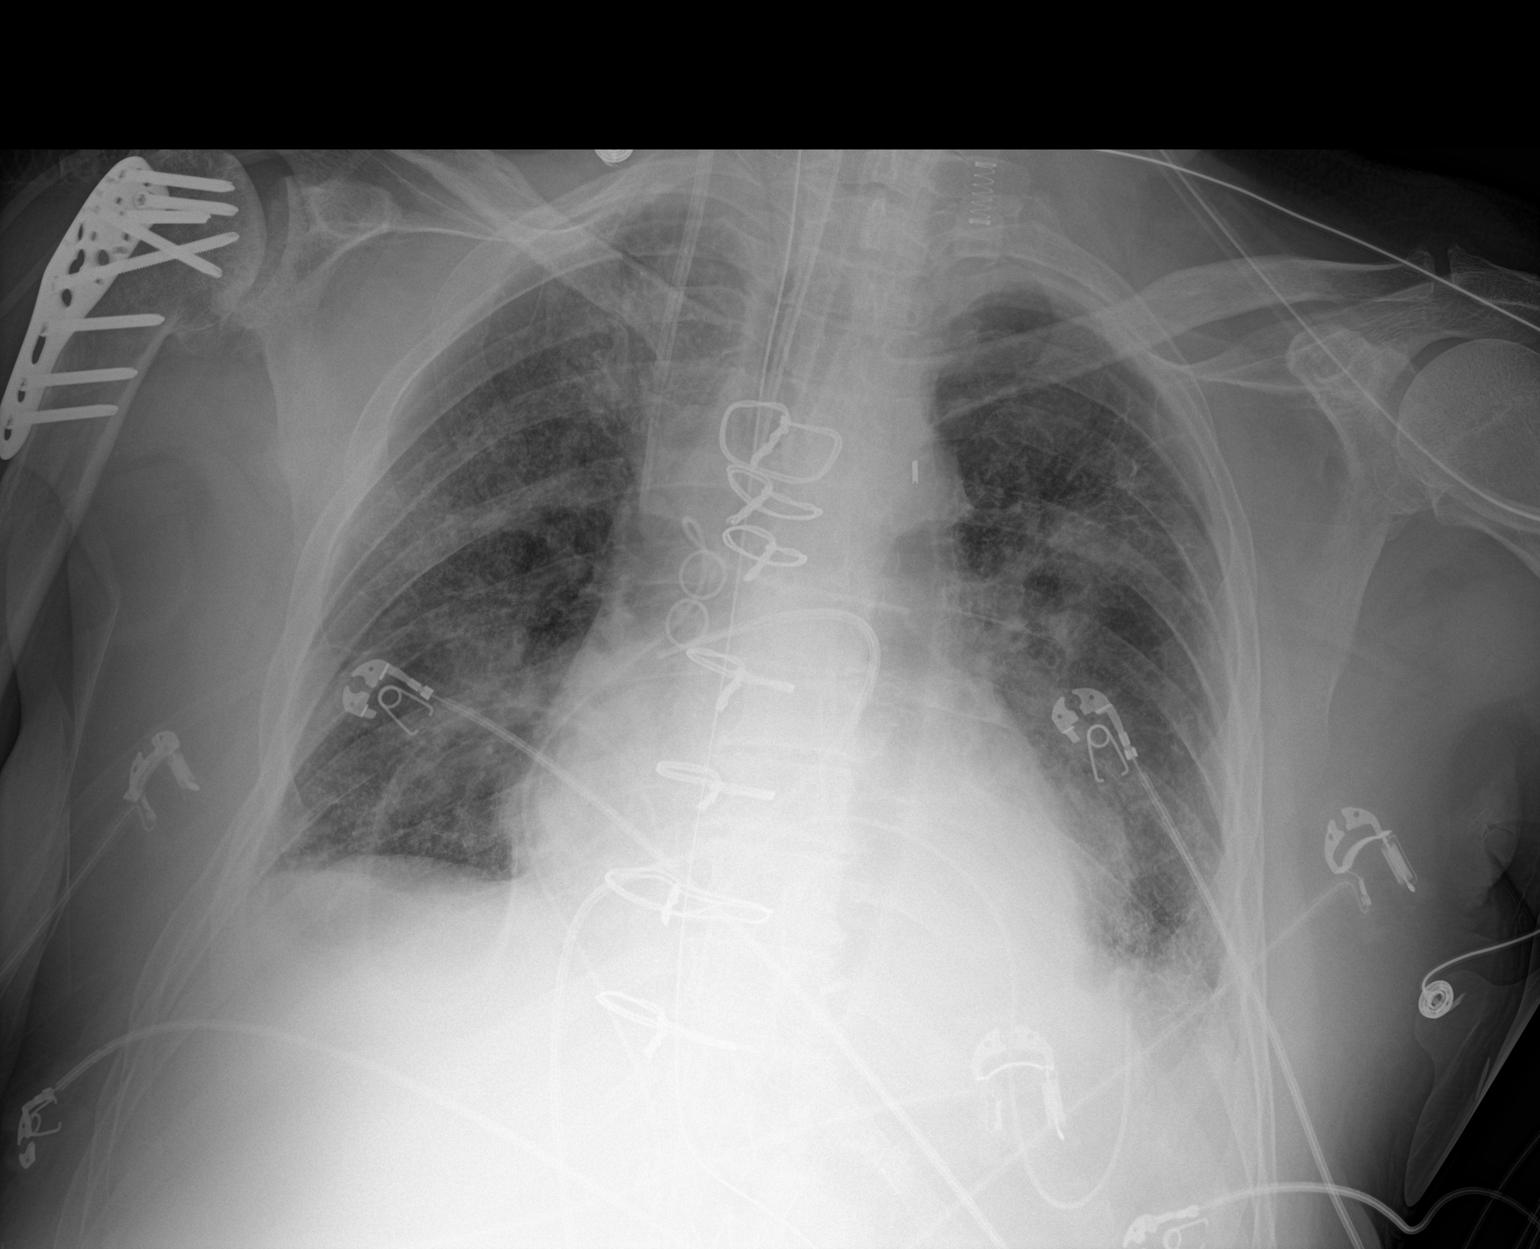

[2 of 2 positions shown; findings below may reference images not displayed]

FINDINGS: Postsurgical changes are now seen. Endotracheal tube, gastric
catheter, Swan-Ganz catheter and pericardial drain are seen
mediastinal drain is noted as well. Intra-aortic balloon pump is
noted in satisfactory position at the level of the aortic arch.
Patchy airspace opacity is noted but improved from the previous
exam. No pneumothorax is seen. Right jugular catheter is noted as
well.
IMPRESSION: Patchy but improved airspace opacities bilaterally.

Tubes and lines as described above.

## 2022-07-28 IMAGING — DX DG ABD PORTABLE 1V
2 series · 2 of 2 positions shown · non-contrast
Comparison: Portable exam 3933 hours compared to 2774 hours

CLINICAL DATA: Feeding tube placement

EXAM:
PORTABLE ABDOMEN - 1 VIEW

[abdomen kub (1 of 2)]
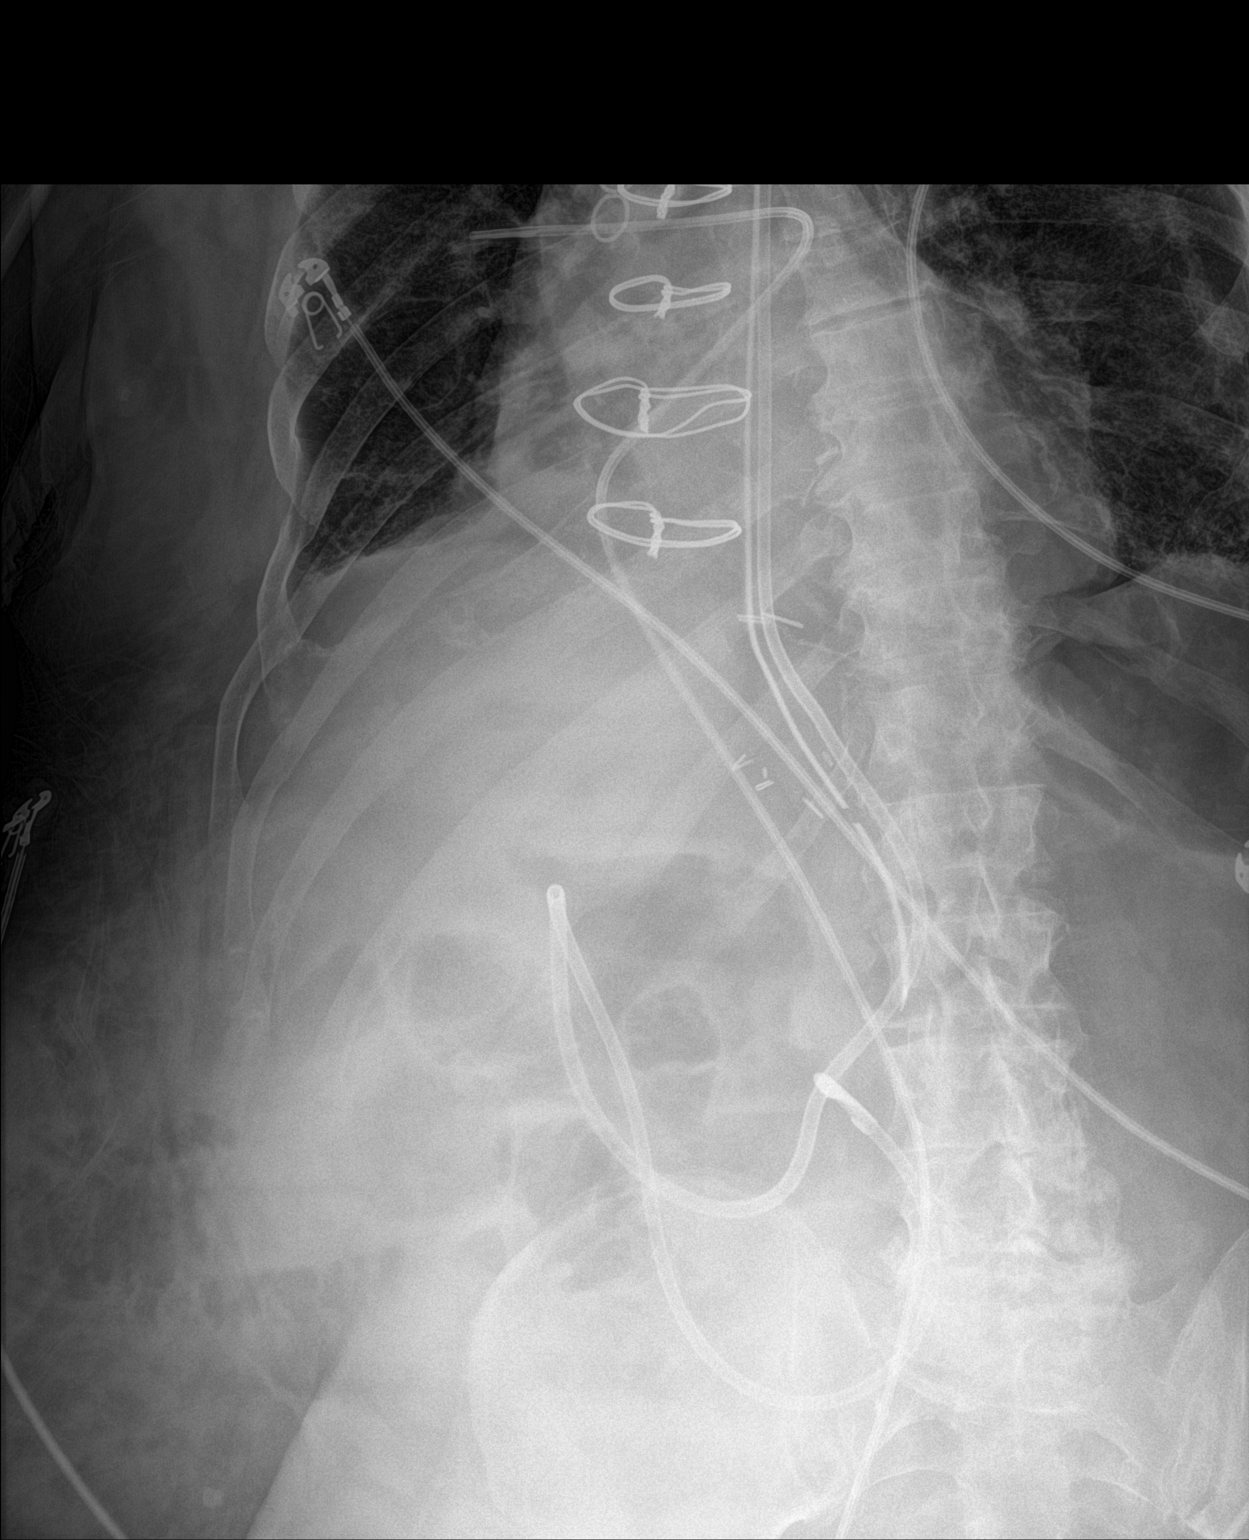

[abdomen kub (2 of 2)]
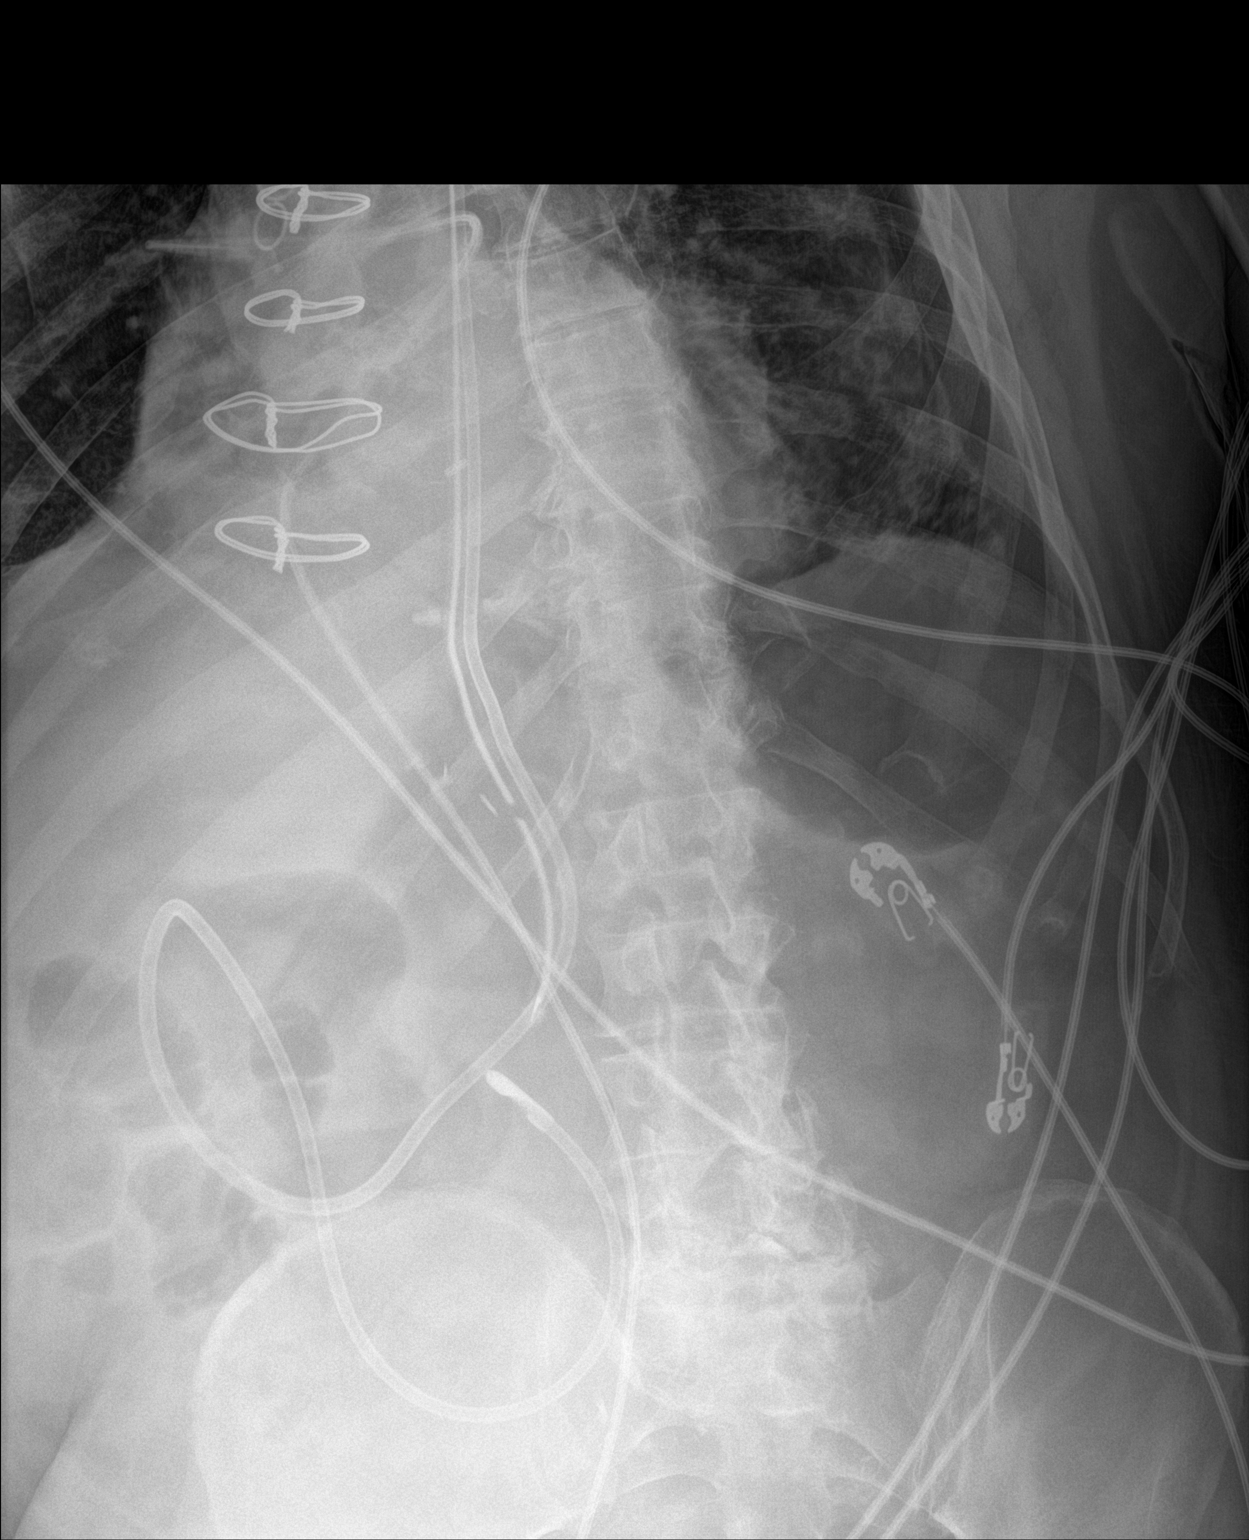

[2 of 2 positions shown; findings below may reference images not displayed]

FINDINGS: Patient is severely rotated to the RIGHT.

Feeding tube appears to traverse the stomach and duodenum with tip
potentially at ligament of Treitz position, suboptimally assessed
due to rotation.

Visualized bowel gas pattern normal.

Tip of nasogastric tube in stomach.
IMPRESSION: Significant patient rotation to RIGHT limits exam.

Tip of feeding tube is probably at the region of ligament of Treitz.

## 2022-07-29 IMAGING — DX DG ABD PORTABLE 1V
1 series · 2 of 2 positions shown · non-contrast
Comparison: Abdominal radiograph dated 09/28/2020.

CLINICAL DATA: Enteric tube placement.

EXAM:
PORTABLE ABDOMEN - 1 VIEW

[Series 1: abdomen · 0.14mm/px · 2 of 2 slices shown]
[im 1/2]
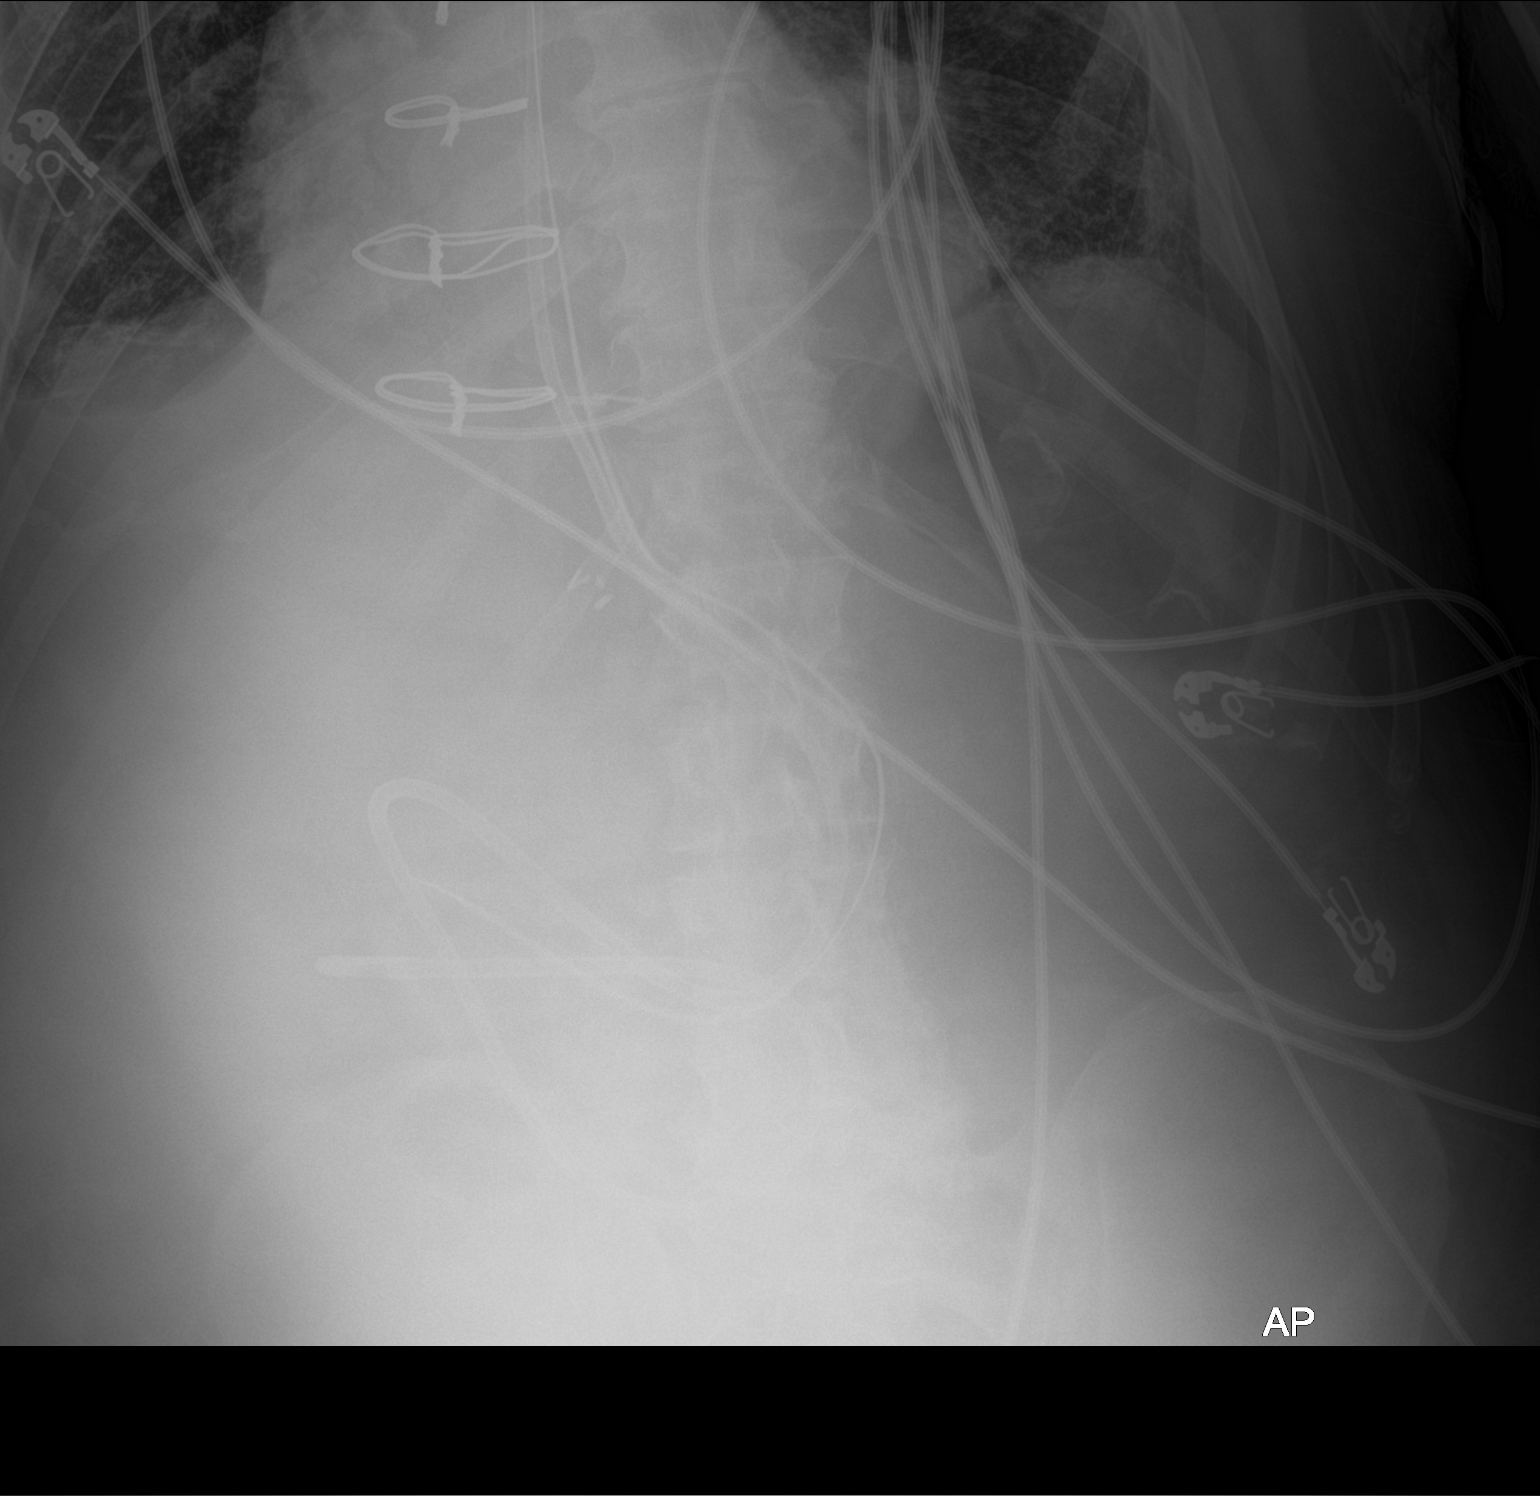
[im 2/2]
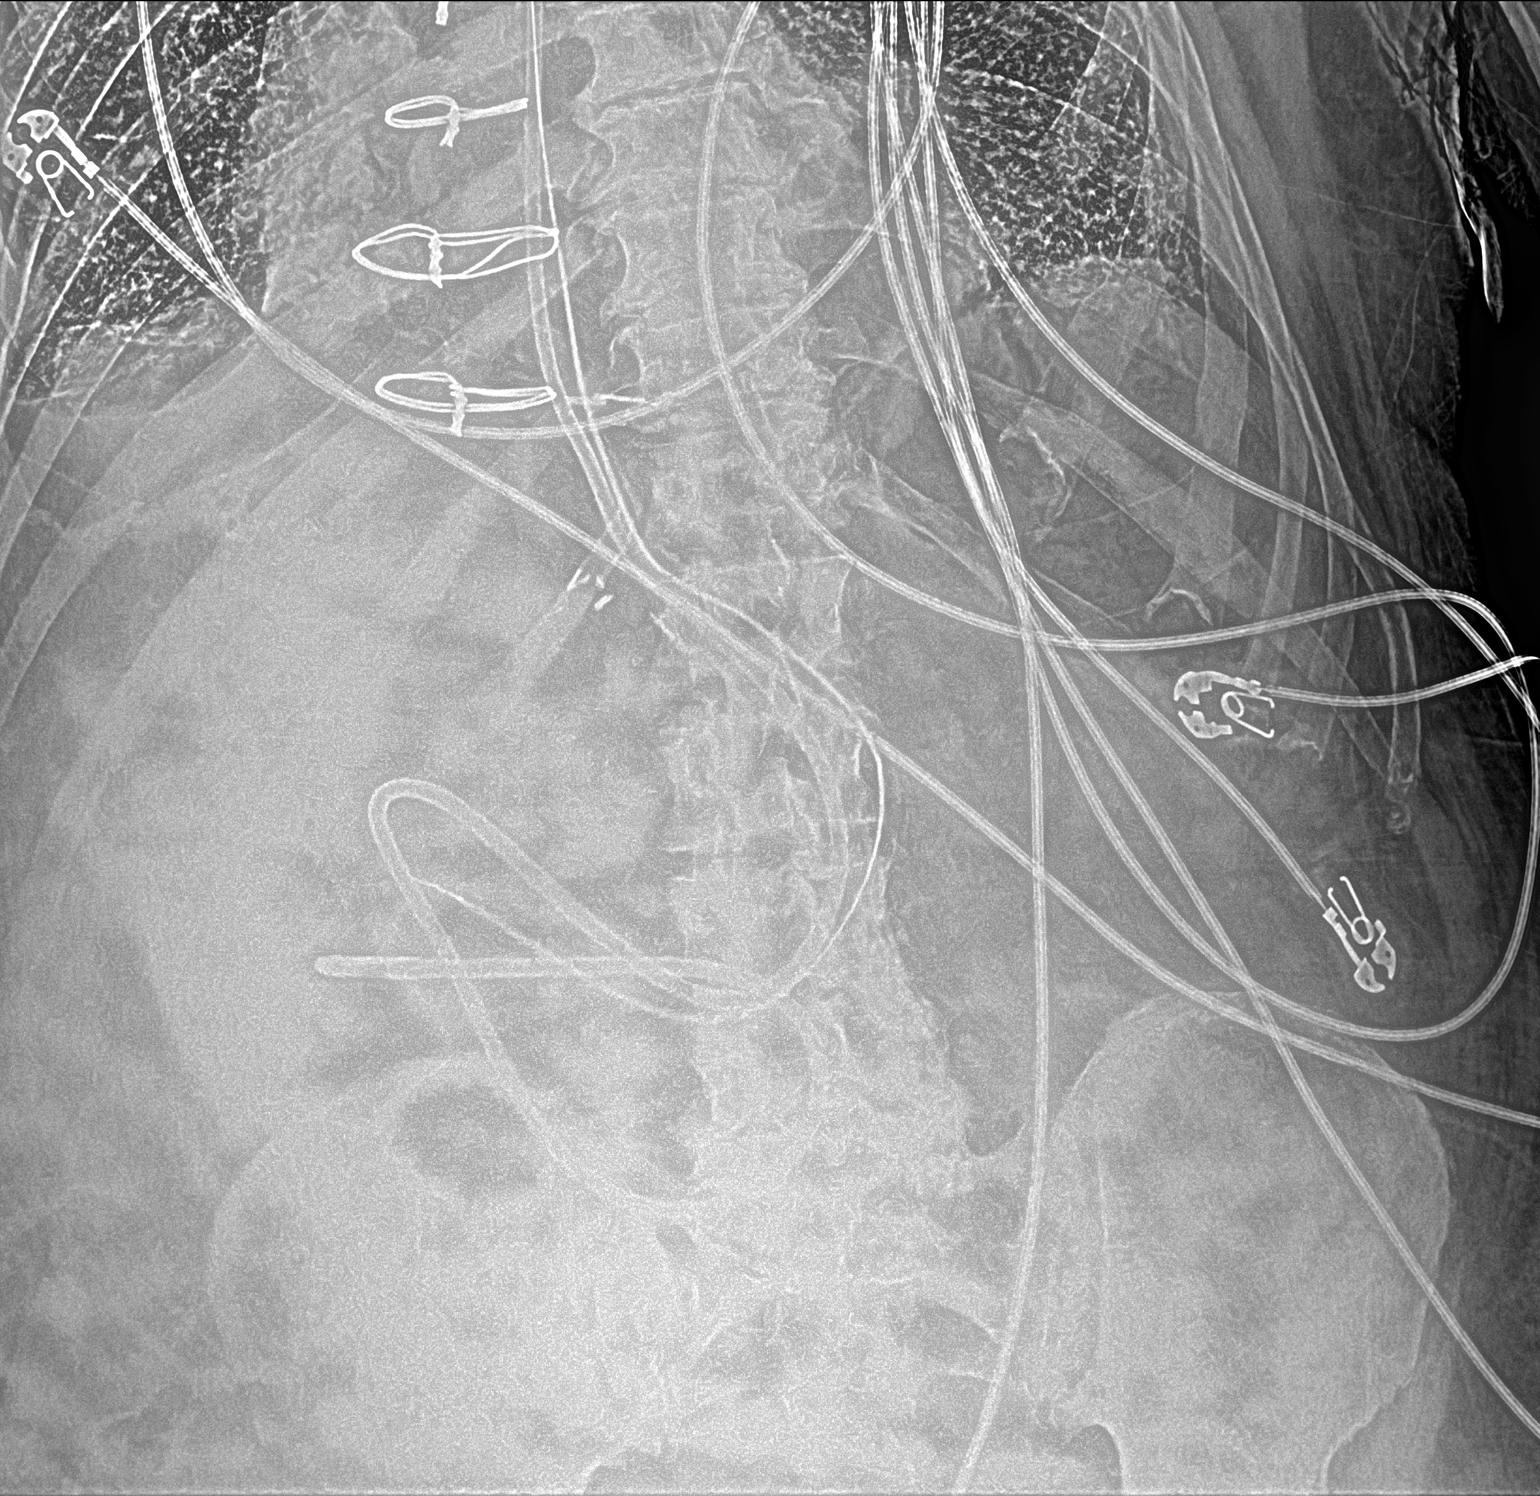

[2 of 2 positions shown; findings below may reference images not displayed]

FINDINGS: An enteric tube terminates in the stomach near the pylorus. A
feeding tube terminates in the distal duodenum/proximal jejunum,
unchanged in positioning accounting for patient rotation.
IMPRESSION: Enteric tube terminating in the stomach near the pylorus. Feeding
tube not significantly changed in position.

## 2022-07-30 IMAGING — DX DG CHEST 1V PORT
1 series · 1 of 1 positions shown · non-contrast
Comparison: Chest x-ray dated 09/28/2020 and 09/27/2020.

CLINICAL DATA: CABG, respiratory dependent.  Pleural effusions.

EXAM:
PORTABLE CHEST 1 VIEW

[chest]
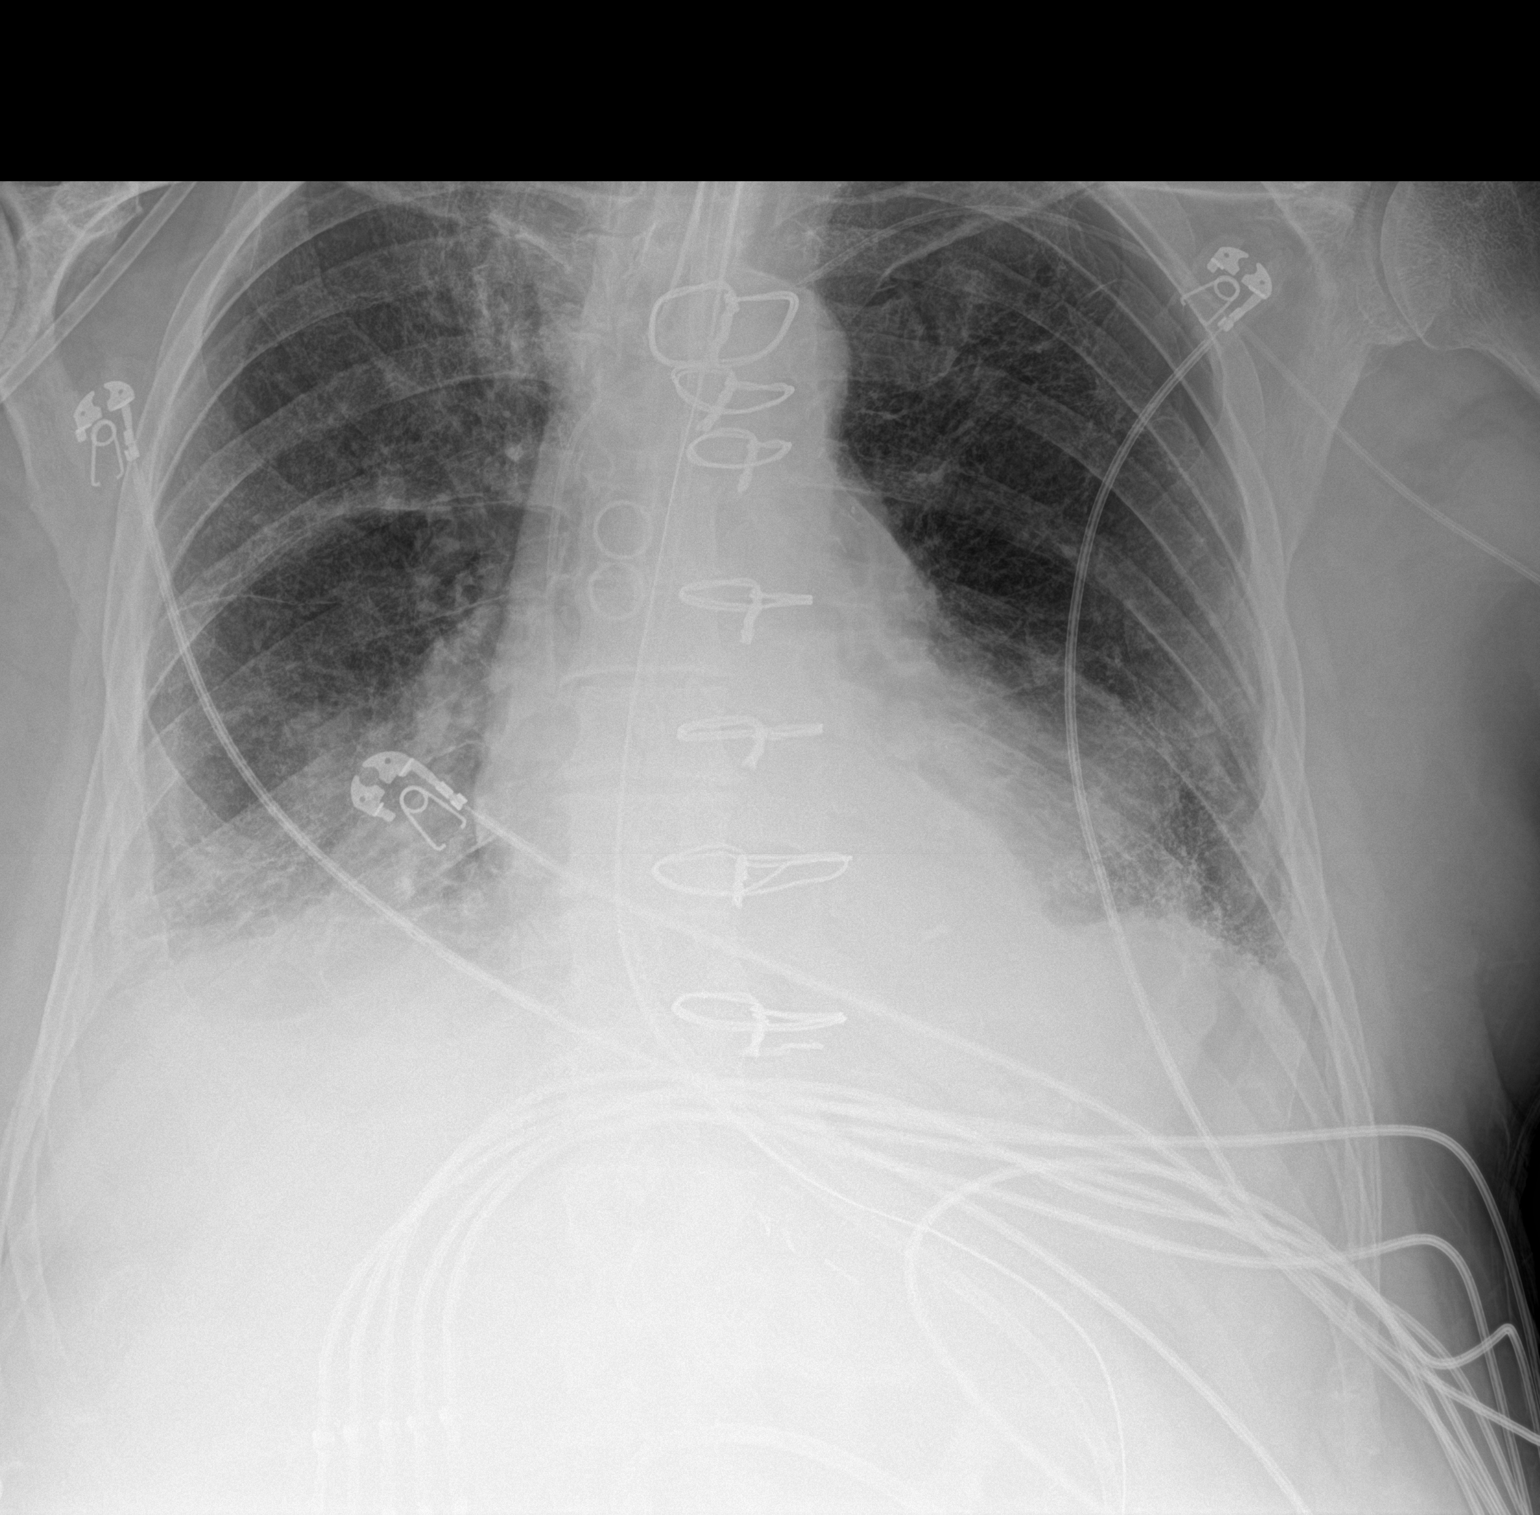

[1 of 1 positions shown; findings below may reference images not displayed]

FINDINGS: Heart size and mediastinal contours are stable. RIGHT IJ Cordis has
been removed. Endotracheal tube and enteric tube appear
appropriately positioned. LEFT-sided PICC line has been placed and
appears well positioned with tip at the level of the lower SVC.

Probable mild interstitial edema. Probable small bilateral pleural
effusions and/or mild bibasilar atelectasis.
IMPRESSION: 1. Probable small bilateral pleural effusions and/or mild bibasilar
atelectasis.
2. Probable mild interstitial edema.
3. Support apparatus appears well positioned.

## 2022-08-08 IMAGING — DX DG CHEST 1V PORT
1 series · 1 of 1 positions shown · non-contrast
Comparison: 10/06/2020

CLINICAL DATA: Respiratory failure.  Ventilator dependent.

EXAM:
PORTABLE CHEST 1 VIEW

[chest ap]
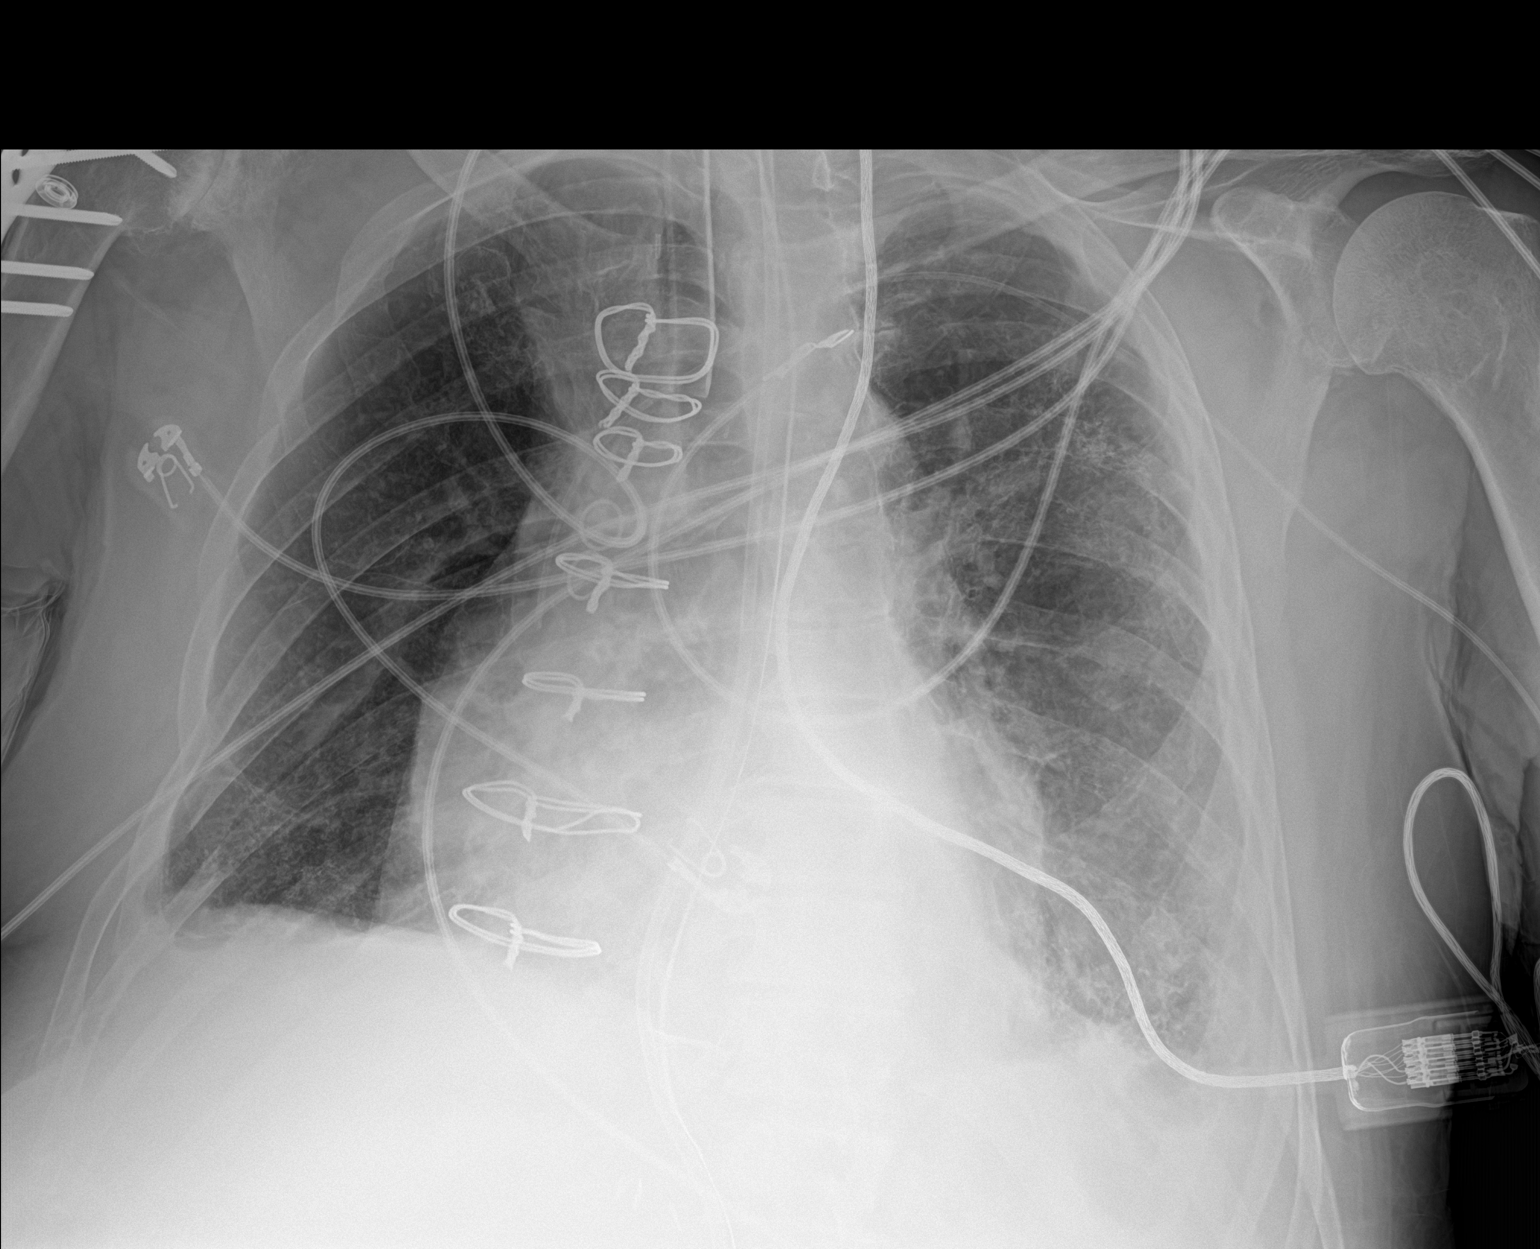

[1 of 1 positions shown; findings below may reference images not displayed]

FINDINGS: Cardiomegaly, CABG changes, endotracheal tube with tip 4 cm above
the carina, NG/OG and feeding tubes entering the UPPER abdomen with
tips off the field of view, and LEFT PICC line with tip overlying
the SUPERIOR cavoatrial junction again noted.

Mild interstitial opacities and trace bilateral pleural effusions
are again noted.

Mild bibasilar atelectasis again identified.

There is no evidence of pneumothorax.
IMPRESSION: Unchanged appearance of the chest with mild interstitial opacities,
trace bilateral pleural effusions and bibasilar atelectasis. Support
apparatus as described.
